# Patient Record
Sex: Female | Born: 1945
Health system: Southern US, Community
[De-identification: ages and names within clinical notes are randomized; demographics above are authoritative.]

## PROBLEM LIST (undated history)

## (undated) DIAGNOSIS — K810 Acute cholecystitis: Secondary | ICD-10-CM

## (undated) DIAGNOSIS — J189 Pneumonia, unspecified organism: Secondary | ICD-10-CM

## (undated) DIAGNOSIS — J45909 Unspecified asthma, uncomplicated: Secondary | ICD-10-CM

## (undated) DIAGNOSIS — J449 Chronic obstructive pulmonary disease, unspecified: Secondary | ICD-10-CM

## (undated) DIAGNOSIS — K8010 Calculus of gallbladder with chronic cholecystitis without obstruction: Secondary | ICD-10-CM

## (undated) DIAGNOSIS — Z87891 Personal history of nicotine dependence: Secondary | ICD-10-CM

## (undated) HISTORY — DX: Personal history of nicotine dependence: Z87.891

## (undated) HISTORY — DX: Pneumonia, unspecified organism: J18.9

## (undated) HISTORY — PX: TONSILLECTOMY: SUR1361

## (undated) HISTORY — DX: Acute cholecystitis: K81.0

## (undated) HISTORY — DX: Calculus of gallbladder with chronic cholecystitis without obstruction: K80.10

---

## 1976-06-01 HISTORY — PX: APPENDECTOMY: SHX54

## 1976-06-01 HISTORY — PX: ABDOMINAL HYSTERECTOMY: SHX81

## 1999-05-27 ENCOUNTER — Encounter: Admission: RE | Admit: 1999-05-27 | Discharge: 1999-05-27 | Payer: Self-pay | Admitting: Family Medicine

## 1999-05-27 ENCOUNTER — Encounter: Payer: Self-pay | Admitting: Family Medicine

## 1999-08-18 ENCOUNTER — Ambulatory Visit (HOSPITAL_COMMUNITY): Admission: RE | Admit: 1999-08-18 | Discharge: 1999-08-18 | Payer: Self-pay

## 1999-08-19 ENCOUNTER — Ambulatory Visit (HOSPITAL_COMMUNITY): Admission: RE | Admit: 1999-08-19 | Discharge: 1999-08-19 | Payer: Self-pay

## 1999-10-30 ENCOUNTER — Encounter: Admission: RE | Admit: 1999-10-30 | Discharge: 1999-10-30 | Payer: Self-pay | Admitting: Family Medicine

## 1999-10-30 ENCOUNTER — Encounter: Payer: Self-pay | Admitting: Family Medicine

## 2005-07-24 ENCOUNTER — Encounter: Admission: RE | Admit: 2005-07-24 | Discharge: 2005-07-24 | Payer: Self-pay | Admitting: Family Medicine

## 2005-10-27 ENCOUNTER — Encounter: Admission: RE | Admit: 2005-10-27 | Discharge: 2005-10-27 | Payer: Self-pay | Admitting: Family Medicine

## 2006-06-18 ENCOUNTER — Encounter: Admission: RE | Admit: 2006-06-18 | Discharge: 2006-06-18 | Payer: Self-pay | Admitting: Family Medicine

## 2006-07-15 ENCOUNTER — Encounter: Admission: RE | Admit: 2006-07-15 | Discharge: 2006-07-15 | Payer: Self-pay | Admitting: Family Medicine

## 2006-08-09 ENCOUNTER — Ambulatory Visit: Payer: Self-pay | Admitting: Vascular Surgery

## 2006-08-16 ENCOUNTER — Ambulatory Visit: Payer: Self-pay | Admitting: Vascular Surgery

## 2006-09-28 ENCOUNTER — Ambulatory Visit: Payer: Self-pay | Admitting: Vascular Surgery

## 2006-10-04 ENCOUNTER — Ambulatory Visit: Payer: Self-pay | Admitting: Vascular Surgery

## 2006-11-15 ENCOUNTER — Ambulatory Visit: Payer: Self-pay | Admitting: Vascular Surgery

## 2007-08-01 ENCOUNTER — Ambulatory Visit: Payer: Self-pay | Admitting: Vascular Surgery

## 2009-01-07 ENCOUNTER — Encounter: Admission: RE | Admit: 2009-01-07 | Discharge: 2009-01-07 | Payer: Self-pay | Admitting: Podiatry

## 2010-07-21 ENCOUNTER — Encounter (INDEPENDENT_AMBULATORY_CARE_PROVIDER_SITE_OTHER): Payer: BC Managed Care – PPO

## 2010-07-21 ENCOUNTER — Encounter: Payer: Self-pay | Admitting: Internal Medicine

## 2010-07-21 DIAGNOSIS — R0609 Other forms of dyspnea: Secondary | ICD-10-CM

## 2010-07-23 ENCOUNTER — Encounter: Payer: Self-pay | Admitting: Internal Medicine

## 2010-08-07 NOTE — Assessment & Plan Note (Signed)
Summary: asthma//jd    Other Orders: Carbon Monoxide diffusing w/capacity (16109) Lung Volumes/Gas dilution or washout (60454) Spirometry (Pre & Post) [phone])

## 2010-10-14 NOTE — Assessment & Plan Note (Signed)
OFFICE VISIT   ALMETER, WESTHOFF  DOB:  1946/05/15                                       11/15/2006  [id number]#:[id number]   The patient presents today for continued followup of laser ablation and  stab phlebectomy on her right leg.  The procedure was on 09/28/2006.  She has had complete resolution of any swelling and discomfort from her  knee proximally.  She does have some numbness over the lateral aspect of  her calf and also continues to have bruising over her pretibial area.  She has some swelling and discomfort over this area as well.  She does  not recall striking this area and explains it is somewhat odd for her to  have continued bruising now 6 weeks after her procedure.  I do not see  any evidence of swelling and she is otherwise pleased with her result.   PLAN:  To see her again in 6 weeks for continue followup.  She will  continue to treat this symptomatically.   Larina Earthly, M.D.  Electronically Signed   TFE/MEDQ  D:  11/15/2006  T:  11/16/2006  Job:  [id number]

## 2011-06-02 HISTORY — PX: CATARACT EXTRACTION, BILATERAL: SHX1313

## 2012-03-29 ENCOUNTER — Other Ambulatory Visit: Payer: Self-pay | Admitting: Family Medicine

## 2012-03-29 DIAGNOSIS — N63 Unspecified lump in unspecified breast: Secondary | ICD-10-CM

## 2012-03-31 ENCOUNTER — Other Ambulatory Visit: Payer: BC Managed Care – PPO

## 2012-04-08 ENCOUNTER — Ambulatory Visit
Admission: RE | Admit: 2012-04-08 | Discharge: 2012-04-08 | Disposition: A | Discharge status: Home / Self Care | Payer: Self-pay | Source: Ambulatory Visit | Attending: Family Medicine | Admitting: Family Medicine

## 2012-04-08 DIAGNOSIS — N63 Unspecified lump in unspecified breast: Secondary | ICD-10-CM

## 2013-04-07 ENCOUNTER — Ambulatory Visit
Admission: RE | Admit: 2013-04-07 | Discharge: 2013-04-07 | Disposition: A | Discharge status: Home / Self Care | Payer: Medicare Other | Source: Ambulatory Visit | Attending: Physician Assistant | Admitting: Physician Assistant

## 2013-04-07 ENCOUNTER — Other Ambulatory Visit: Payer: Self-pay | Admitting: Physician Assistant

## 2013-04-07 DIAGNOSIS — T1490XA Injury, unspecified, initial encounter: Secondary | ICD-10-CM

## 2015-06-12 ENCOUNTER — Other Ambulatory Visit: Payer: Self-pay | Admitting: *Deleted

## 2015-06-13 ENCOUNTER — Other Ambulatory Visit: Payer: Self-pay

## 2015-06-13 DIAGNOSIS — Z1231 Encounter for screening mammogram for malignant neoplasm of breast: Secondary | ICD-10-CM

## 2015-07-08 ENCOUNTER — Ambulatory Visit
Admission: RE | Admit: 2015-07-08 | Discharge: 2015-07-08 | Disposition: A | Discharge status: Home / Self Care | Payer: Medicare Other | Source: Ambulatory Visit

## 2015-07-08 DIAGNOSIS — Z1231 Encounter for screening mammogram for malignant neoplasm of breast: Secondary | ICD-10-CM

## 2015-08-16 ENCOUNTER — Encounter: Payer: Self-pay | Admitting: Emergency Medicine

## 2015-08-16 ENCOUNTER — Inpatient Hospital Stay
Admission: EM | Admit: 2015-08-16 | Discharge: 2015-08-21 | DRG: 191 | Disposition: A | Discharge status: Home / Self Care | Payer: Medicare Other | Attending: Internal Medicine | Admitting: Internal Medicine

## 2015-08-16 ENCOUNTER — Emergency Department: Payer: Medicare Other

## 2015-08-16 DIAGNOSIS — K5900 Constipation, unspecified: Secondary | ICD-10-CM | POA: Diagnosis present

## 2015-08-16 DIAGNOSIS — J440 Chronic obstructive pulmonary disease with acute lower respiratory infection: Secondary | ICD-10-CM | POA: Diagnosis not present

## 2015-08-16 DIAGNOSIS — J441 Chronic obstructive pulmonary disease with (acute) exacerbation: Secondary | ICD-10-CM | POA: Diagnosis not present

## 2015-08-16 DIAGNOSIS — G4700 Insomnia, unspecified: Secondary | ICD-10-CM | POA: Diagnosis present

## 2015-08-16 DIAGNOSIS — R7301 Impaired fasting glucose: Secondary | ICD-10-CM | POA: Diagnosis present

## 2015-08-16 DIAGNOSIS — Z7951 Long term (current) use of inhaled steroids: Secondary | ICD-10-CM | POA: Diagnosis not present

## 2015-08-16 DIAGNOSIS — R1310 Dysphagia, unspecified: Secondary | ICD-10-CM | POA: Diagnosis present

## 2015-08-16 DIAGNOSIS — Z66 Do not resuscitate: Secondary | ICD-10-CM | POA: Diagnosis present

## 2015-08-16 DIAGNOSIS — R0600 Dyspnea, unspecified: Secondary | ICD-10-CM

## 2015-08-16 DIAGNOSIS — E871 Hypo-osmolality and hyponatremia: Secondary | ICD-10-CM | POA: Diagnosis present

## 2015-08-16 DIAGNOSIS — J9610 Chronic respiratory failure, unspecified whether with hypoxia or hypercapnia: Secondary | ICD-10-CM | POA: Diagnosis present

## 2015-08-16 DIAGNOSIS — Z87891 Personal history of nicotine dependence: Secondary | ICD-10-CM | POA: Diagnosis not present

## 2015-08-16 DIAGNOSIS — Z79899 Other long term (current) drug therapy: Secondary | ICD-10-CM

## 2015-08-16 DIAGNOSIS — R739 Hyperglycemia, unspecified: Secondary | ICD-10-CM | POA: Diagnosis present

## 2015-08-16 DIAGNOSIS — J438 Other emphysema: Secondary | ICD-10-CM | POA: Diagnosis not present

## 2015-08-16 DIAGNOSIS — J449 Chronic obstructive pulmonary disease, unspecified: Secondary | ICD-10-CM | POA: Insufficient documentation

## 2015-08-16 DIAGNOSIS — E860 Dehydration: Secondary | ICD-10-CM | POA: Diagnosis present

## 2015-08-16 DIAGNOSIS — J208 Acute bronchitis due to other specified organisms: Secondary | ICD-10-CM | POA: Diagnosis present

## 2015-08-16 HISTORY — DX: Chronic obstructive pulmonary disease, unspecified: J44.9

## 2015-08-16 LAB — CBC WITH DIFFERENTIAL/PLATELET
BASOS PCT: 1 %
Basophils Absolute: 0.1 10*3/uL (ref 0–0.1)
EOS ABS: 0.6 10*3/uL (ref 0–0.7)
Eosinophils Relative: 8 %
HCT: 40.3 % (ref 35.0–47.0)
Hemoglobin: 13.9 g/dL (ref 12.0–16.0)
Lymphocytes Relative: 22 %
Lymphs Abs: 1.7 10*3/uL (ref 1.0–3.6)
MCH: 30.5 pg (ref 26.0–34.0)
MCHC: 34.6 g/dL (ref 32.0–36.0)
MCV: 88.1 fL (ref 80.0–100.0)
MONO ABS: 0.7 10*3/uL (ref 0.2–0.9)
MONOS PCT: 10 %
Neutro Abs: 4.4 10*3/uL (ref 1.4–6.5)
Neutrophils Relative %: 59 %
Platelets: 288 10*3/uL (ref 150–440)
RBC: 4.58 MIL/uL (ref 3.80–5.20)
RDW: 13 % (ref 11.5–14.5)
WBC: 7.5 10*3/uL (ref 3.6–11.0)

## 2015-08-16 LAB — TSH: TSH: 6.204 u[IU]/mL — AB (ref 0.350–4.500)

## 2015-08-16 LAB — COMPREHENSIVE METABOLIC PANEL
ALT: 18 U/L (ref 14–54)
AST: 23 U/L (ref 15–41)
Albumin: 4.4 g/dL (ref 3.5–5.0)
Alkaline Phosphatase: 68 U/L (ref 38–126)
Anion gap: 9 (ref 5–15)
BILIRUBIN TOTAL: 0.8 mg/dL (ref 0.3–1.2)
BUN: 22 mg/dL — AB (ref 6–20)
CO2: 20 mmol/L — ABNORMAL LOW (ref 22–32)
CREATININE: 1 mg/dL (ref 0.44–1.00)
Calcium: 8.6 mg/dL — ABNORMAL LOW (ref 8.9–10.3)
Chloride: 101 mmol/L (ref 101–111)
GFR calc Af Amer: >60 mL/min (ref >60)
GFR, EST NON AFRICAN AMERICAN: 56 mL/min — AB (ref >60)
Glucose, Bld: 128 mg/dL — ABNORMAL HIGH (ref 65–99)
Potassium: 3.8 mmol/L (ref 3.5–5.1)
Sodium: 130 mmol/L — ABNORMAL LOW (ref 135–145)
TOTAL PROTEIN: 7.6 g/dL (ref 6.5–8.1)

## 2015-08-16 LAB — BRAIN NATRIURETIC PEPTIDE: B Natriuretic Peptide: 26 pg/mL (ref 0.0–100.0)

## 2015-08-16 LAB — TROPONIN I

## 2015-08-16 LAB — INFLUENZA PANEL BY PCR (TYPE A & B)
H1N1FLUPCR: NOT DETECTED
INFLAPCR: NEGATIVE
Influenza B By PCR: NEGATIVE

## 2015-08-16 LAB — HEMOGLOBIN A1C: Hgb A1c MFr Bld: 5.7 % (ref 4.0–6.0)

## 2015-08-16 MED ORDER — ALPRAZOLAM 0.25 MG PO TABS
0.2500 mg | ORAL_TABLET | Freq: Two times a day (BID) | ORAL | Status: DC | PRN
  Administered 2015-08-19: 16:00:00 0.25 mg via ORAL
  Filled 2015-08-16: qty 1

## 2015-08-16 MED ORDER — LEVOFLOXACIN IN D5W 250 MG/50ML IV SOLN
250.0000 mg | INTRAVENOUS | Status: DC
  Filled 2015-08-16: qty 50

## 2015-08-16 MED ORDER — ACETAMINOPHEN 325 MG PO TABS: 650.0000 mg | ORAL_TABLET | Freq: Four times a day (QID) | ORAL | Status: DC | PRN

## 2015-08-16 MED ORDER — ACETAMINOPHEN 650 MG RE SUPP: 650.0000 mg | Freq: Four times a day (QID) | RECTAL | Status: DC | PRN

## 2015-08-16 MED ORDER — HYDROCODONE-ACETAMINOPHEN 5-325 MG PO TABS
1.0000 | ORAL_TABLET | ORAL | Status: DC | PRN
  Administered 2015-08-16 (×2): 1 via ORAL
  Administered 2015-08-16: 2 via ORAL
  Administered 2015-08-17 – 2015-08-18 (×4): 1 via ORAL
  Administered 2015-08-19: 07:00:00 2 via ORAL
  Filled 2015-08-16 (×2): qty 1
  Filled 2015-08-16: qty 2
  Filled 2015-08-16: qty 1
  Filled 2015-08-16: qty 2
  Filled 2015-08-16: qty 1
  Filled 2015-08-16 (×2): qty 2

## 2015-08-16 MED ORDER — IPRATROPIUM-ALBUTEROL 0.5-2.5 (3) MG/3ML IN SOLN
3.0000 mL | Freq: Once | RESPIRATORY_TRACT | Status: AC
  Administered 2015-08-16: 3 mL via RESPIRATORY_TRACT

## 2015-08-16 MED ORDER — SODIUM CHLORIDE 0.9 % IV SOLN
INTRAVENOUS | Status: DC
  Administered 2015-08-16 – 2015-08-17 (×2): via INTRAVENOUS

## 2015-08-16 MED ORDER — TIOTROPIUM BROMIDE MONOHYDRATE 18 MCG IN CAPS
18.0000 ug | ORAL_CAPSULE | Freq: Every day | RESPIRATORY_TRACT | Status: DC
  Administered 2015-08-16 – 2015-08-21 (×6): 18 ug via RESPIRATORY_TRACT
  Filled 2015-08-16 (×2): qty 5

## 2015-08-16 MED ORDER — ONDANSETRON HCL 4 MG PO TABS: 4.0000 mg | ORAL_TABLET | Freq: Four times a day (QID) | ORAL | Status: DC | PRN

## 2015-08-16 MED ORDER — ACETAMINOPHEN 500 MG PO TABS
1000.0000 mg | ORAL_TABLET | Freq: Once | ORAL | Status: AC
  Administered 2015-08-16: 1000 mg via ORAL
  Filled 2015-08-16: qty 2

## 2015-08-16 MED ORDER — ONDANSETRON HCL 4 MG/2ML IJ SOLN
4.0000 mg | Freq: Four times a day (QID) | INTRAMUSCULAR | Status: DC | PRN
  Administered 2015-08-17: 07:00:00 4 mg via INTRAVENOUS
  Filled 2015-08-16: qty 2

## 2015-08-16 MED ORDER — IPRATROPIUM-ALBUTEROL 0.5-2.5 (3) MG/3ML IN SOLN
3.0000 mL | Freq: Once | RESPIRATORY_TRACT | Status: AC
  Administered 2015-08-16: 3 mL via RESPIRATORY_TRACT
  Filled 2015-08-16: qty 3

## 2015-08-16 MED ORDER — ALBUTEROL SULFATE (2.5 MG/3ML) 0.083% IN NEBU
2.5000 mg | INHALATION_SOLUTION | Freq: Once | RESPIRATORY_TRACT | Status: AC
  Administered 2015-08-16: 2.5 mg via RESPIRATORY_TRACT
  Filled 2015-08-16: qty 3

## 2015-08-16 MED ORDER — SENNOSIDES-DOCUSATE SODIUM 8.6-50 MG PO TABS: 1.0000 | ORAL_TABLET | Freq: Every evening | ORAL | Status: DC | PRN

## 2015-08-16 MED ORDER — IPRATROPIUM-ALBUTEROL 0.5-2.5 (3) MG/3ML IN SOLN
3.0000 mL | RESPIRATORY_TRACT | Status: DC
  Administered 2015-08-16 – 2015-08-18 (×14): 3 mL via RESPIRATORY_TRACT
  Filled 2015-08-16 (×15): qty 3

## 2015-08-16 MED ORDER — LEVOFLOXACIN IN D5W 500 MG/100ML IV SOLN
500.0000 mg | Freq: Once | INTRAVENOUS | Status: AC
  Administered 2015-08-16: 500 mg via INTRAVENOUS
  Filled 2015-08-16: qty 100

## 2015-08-16 MED ORDER — LEVOFLOXACIN IN D5W 500 MG/100ML IV SOLN: 500.0000 mg | INTRAVENOUS | Status: DC

## 2015-08-16 MED ORDER — RISAQUAD PO CAPS
1.0000 | ORAL_CAPSULE | Freq: Every day | ORAL | Status: DC
  Administered 2015-08-16 – 2015-08-21 (×6): 1 via ORAL
  Filled 2015-08-16 (×6): qty 1

## 2015-08-16 MED ORDER — METHYLPREDNISOLONE SODIUM SUCC 125 MG IJ SOLR
60.0000 mg | Freq: Three times a day (TID) | INTRAMUSCULAR | Status: DC
  Administered 2015-08-16 – 2015-08-18 (×6): 60 mg via INTRAVENOUS
  Filled 2015-08-16 (×6): qty 2

## 2015-08-16 MED ORDER — MAGNESIUM SULFATE 2 GM/50ML IV SOLN
2.0000 g | Freq: Once | INTRAVENOUS | Status: AC
  Administered 2015-08-16: 2 g via INTRAVENOUS
  Filled 2015-08-16: qty 50

## 2015-08-16 MED ORDER — MONTELUKAST SODIUM 10 MG PO TABS
10.0000 mg | ORAL_TABLET | Freq: Every day | ORAL | Status: DC
  Administered 2015-08-16 – 2015-08-21 (×6): 10 mg via ORAL
  Filled 2015-08-16 (×6): qty 1

## 2015-08-16 MED ORDER — ENOXAPARIN SODIUM 40 MG/0.4ML ~~LOC~~ SOLN
40.0000 mg | SUBCUTANEOUS | Status: DC
  Filled 2015-08-16 (×4): qty 0.4

## 2015-08-16 MED ORDER — ALUM & MAG HYDROXIDE-SIMETH 200-200-20 MG/5ML PO SUSP: 30.0000 mL | Freq: Four times a day (QID) | ORAL | Status: DC | PRN

## 2015-08-16 MED ORDER — SODIUM CHLORIDE 0.9% FLUSH
3.0000 mL | Freq: Two times a day (BID) | INTRAVENOUS | Status: DC
  Administered 2015-08-16 – 2015-08-21 (×11): 3 mL via INTRAVENOUS

## 2015-08-16 NOTE — ED Notes (Signed)
Pt presents with shortness of breath and wheezing for one week. Audible wheezing heard across the room.

## 2015-08-16 NOTE — Progress Notes (Signed)
Pharmacy Note - Renal dose adjustment  Patient with orders for levofloxacin 500mg  IV Q24H for AECOPD.   Estimated Creatinine Clearance: 47.1 mL/min (by C-G formula based on Cr of 1).   Will adjust to levaquin 500mg  IV x 1 followed by 250mg  IV Q24H.  Will continue to follow and adjust as needed.  Garlon HatchetJody Jordynne Mccown, PharmD Clinical Pharmacist 08/16/2015 2:18 PM

## 2015-08-16 NOTE — ED Provider Notes (Addendum)
[facility] Emergency Department Provider Note  ____________________________________________  Time seen: Approximately 7:29 AM  I have reviewed the triage vital signs and the nursing notes.   HISTORY  Chief Complaint Shortness of Breath    HPI Stacey Durham is a 70 y.o. several days of worsening cough and shortness of breath. Last couple days has been so bad she has not been sleepy. She was coughing up white phlegm but is not now she's felt hot but really has not run a fever that she is for sure about. Patient is now very short of breath working to breathe hard and wheezing  Past Medical History  Diagnosis Date  . COPD (chronic obstructive pulmonary disease) (HCC)    COPD There are no active problems to display for this patient.   Past Surgical History  Procedure Laterality Date  . Abdominal hysterectomy    . Tonsillectomy    . Appendectomy      No current outpatient prescriptions on file.  Allergies Review of patient's allergies indicates no known allergies.  No family history on file.  Social History Social History  Substance Use Topics  . Smoking status: Never Smoker   . Smokeless tobacco: None  . Alcohol Use: No    Review of Systems Constitutional: No fever/chills Eyes: No visual changes. ENT: No sore throat. Cardiovascular: Denies chest pain. Respiratory:shortness of breath. Gastrointestinal: No abdominal pain.  No nausea, no vomiting.  No diarrhea.  No constipation. Genitourinary: Negative for dysuria. Musculoskeletal: Negative for back pain. Skin: Negative for rash. Neurological: Negative for headaches, focal weakness or numbness.  10-point ROS otherwise negative.  ____________________________________________   PHYSICAL EXAM:  VITAL SIGNS: ED Triage Vitals  Enc Vitals Group     BP --      Pulse --      Resp --      Temp --      Temp src --      SpO2 --      Weight --      Height --      Head Cir --       Peak Flow --      Pain Score --      Pain Loc --      Pain Edu? --      Excl. in GC? --    Constitutional: Alert and oriented. Respiratory distress Eyes: Conjunctivae are normal. PERRL. EOMI. Head: Atraumatic. Nose: No congestion/rhinnorhea. Mouth/Throat: Mucous membranes are moist.  Oropharynx non-erythematous. Neck: No stridor. Cardiovascular: Normal rate, regular rhythm. Grossly normal heart sounds.  Good peripheral circulation. Respiratory: Increased respiratory effort. retractions. Lungs wheezing Gastrointestinal: Soft and nontender. No distention. No abdominal bruits. No CVA tenderness. Musculoskeletal: No lower extremity tenderness nor edema.  No joint effusions. Neurologic:  Normal speech and language. No gross focal neurologic deficits are appreciated. No gait instability. Skin:  Skin is warm, dry and intact. No rash noted. Psychiatric: Mood and affect are normal. Speech and behavior are normal.  ____________________________________________   LABS (all labs ordered are listed, but only abnormal results are displayed)  Labs Reviewed  COMPREHENSIVE METABOLIC PANEL  TROPONIN I  BRAIN NATRIURETIC PEPTIDE  CBC WITH DIFFERENTIAL/PLATELET   ____________________________________________  EKG  EKG read and interpreted by me shows normal sinus rhythm rate of 96 normal axis no acute ST-T wave changes ____________________________________________  RADIOLOGY  Radiology reads the film as hyperexpansion bilateral bibasilar atelectasis. ____________________________________________   PROCEDURES   ____________________________________________   INITIAL IMPRESSION / ASSESSMENT AND PLAN /  ED COURSE  Pertinent labs & imaging results that were available during my care of the patient were reviewed by me and considered in my medical decision making (see chart for details).  We'll sign the patient out to Dr. Dione BoozeKanner. She has had 1-1 and is still working hard to  breathe. ____________________________________________   FINAL CLINICAL IMPRESSION(S) / ED DIAGNOSES  Final diagnoses:  Dyspnea      Arnaldo NatalPaul F Sire Poet, MD 08/16/15 0732    Arnaldo NatalPaul F Aitan Rossbach, MD 08/16/15 [phone]

## 2015-08-16 NOTE — H&P (Signed)
[facility] [place] [facility]   PATIENT NAME: Stacey Durham    MR#:  [id number]  DATE OF BIRTH:  Nov 11, 1945  DATE OF ADMISSION:  08/16/2015  PRIMARY CARE PHYSICIAN: Dr Clovis Riley REQUESTING/REFERRING PHYSICIAN: Dr Cyril Loosen  CHIEF COMPLAINT:   SOB  HISTORY OF PRESENT ILLNESS:  Daphna Lafuente  is a 70 y.o. female with a known history of Non-oxygen dependent COPD who presents with above complaint. For the past week patient has shortness of breath, cough and dyspnea exertion. She also endorses fatigue and wheezing. She is diagnosed with COPD exacerbation. She is given DuoNeb's 3 and same Medrol and continues to have bilateral wheezing. He shows no infiltrate. He  PAST MEDICAL HISTORY:   Past Medical History  Diagnosis Date  . COPD (chronic obstructive pulmonary disease) (HCC)     PAST SURGICAL HISTORY:   Past Surgical History  Procedure Laterality Date  . Abdominal hysterectomy    . Tonsillectomy    . Appendectomy      SOCIAL HISTORY:   Social History  Substance Use Topics  . Smoking status: Never Smoker   . Smokeless tobacco: Not on file  . Alcohol Use: No    FAMILY HISTORY:  No family history on file.  DRUG ALLERGIES:  No Known Allergies   REVIEW OF SYSTEMS:  CONSTITUTIONAL: No fever, ++fatigue and weakness.  EYES: No blurred or double vision.  EARS, NOSE, AND THROAT: No tinnitus or ear pain.  RESPIRATORY:++ cough, shortness of breath, wheezing NO hemoptysis.  CARDIOVASCULAR: No chest pain, orthopnea, edema.  GASTROINTESTINAL: No nausea, vomiting, diarrhea or abdominal pain.  GENITOURINARY: No dysuria, hematuria.  ENDOCRINE: No polyuria, nocturia,  HEMATOLOGY: No anemia, easy bruising or bleeding SKIN: No rash or lesion. MUSCULOSKELETAL: No joint pain or arthritis.   NEUROLOGIC: No tingling, numbness, weakness.  PSYCHIATRY: No anxiety or depression.   MEDICATIONS AT HOME:   Prior to Admission medications   Medication  Sig Start Date End Date Taking? Authorizing Provider  acidophilus (RISAQUAD) CAPS capsule Take 1 capsule by mouth daily.   Yes Historical Provider, MD  albuterol (PROVENTIL) (2.5 MG/3ML) 0.083% nebulizer solution Take 1 vial by nebulization as needed. 08/15/15  Yes Historical Provider, MD  calcium-vitamin D (OSCAL WITH D) 500-200 MG-UNIT tablet Take 1 tablet by mouth.   Yes Historical Provider, MD  montelukast (SINGULAIR) 10 MG tablet Take 1 tablet by mouth daily. 07/11/15  Yes Historical Provider, MD  PROAIR HFA 108 (90 Base) MCG/ACT inhaler Inhale 2 puffs into the lungs as needed. 06/05/15  Yes Historical Provider, MD      VITAL SIGNS:  Blood pressure 127/77, pulse 76, resp. rate 15, SpO2 98 %.  PHYSICAL EXAMINATION:  GENERAL:  70 y.o.-year-old patient lying in the bed with no acute distress. She is exhibiting pursed lip breathing EYES: Pupils equal, round, reactive to light and accommodation. No scleral icterus. Extraocular muscles intact.  HEENT: Head atraumatic, normocephalic. Oropharynx and nasopharynx clear.  NECK:  Supple, no jugular venous distention. No thyroid enlargement, no tenderness.  LUNGS: Bilateral diffuse expiratory wheezing without rales,rhonchi or crepitation. No use of accessory muscles of respiration.  CARDIOVASCULAR: S1, S2 normal. No murmurs, rubs, or gallops.  ABDOMEN: Soft, nontender, nondistended. Bowel sounds present. No organomegaly or mass.  EXTREMITIES: No pedal edema, cyanosis, or clubbing.  NEUROLOGIC: Cranial nerves II through XII are grossly intact. No focal deficits. PSYCHIATRIC: The patient is alert and oriented x 3.  SKIN: No obvious rash, lesion, or ulcer.   LABORATORY PANEL:  CBC  Recent Labs Lab 08/16/15 0739  WBC 7.5  HGB 13.9  HCT 40.3  PLT 288   ------------------------------------------------------------------------------------------------------------------  Chemistries   Recent Labs Lab 08/16/15 0739  NA 130*  K 3.8  CL 101   CO2 20*  GLUCOSE 128*  BUN 22*  CREATININE 1.00  CALCIUM 8.6*  AST 23  ALT 18  ALKPHOS 68  BILITOT 0.8   ------------------------------------------------------------------------------------------------------------------  Cardiac Enzymes  Recent Labs Lab 08/16/15 0739  TROPONINI <0.03   ------------------------------------------------------------------------------------------------------------------  RADIOLOGY:  Dg Chest Portable 1 View  08/16/2015  CLINICAL DATA:  Worsening cough and shortness of breath that began 1 day prior. History of COPD and smoking. EXAM: PORTABLE CHEST 1 VIEW COMPARISON:  None. FINDINGS: Normal cardiac silhouette and mediastinal contours with atherosclerotic plaque within the thoracic aorta. The lungs appear hyperexpanded with flattening of the diaphragms. Bibasilar linear heterogeneous opacities favored to represent atelectasis. No focal airspace opacities. No pleural effusion or pneumothorax. No evidence of edema. No acute osseus abnormalities. IMPRESSION: Lung hyperexpansion and bibasilar atelectasis without definitive acute cardiopulmonary disease on this AP portable examination. Further evaluation with a PA and lateral chest radiograph may be obtained as clinically indicated. Electronically Signed   By: Simonne ComeJohn  Watts M.D.   On: 08/16/2015 07:48    EKG:   Sinus tachycardia no ST elevation or depression  IMPRESSION AND PLAN:    70 year old female with history of COPD who presents with acute COPD exacerbation.  1. Acute COPD exacerbation: Patient needs IV steroids, DuoNeb's and inhalers. Levaquin will be added for bronchitis associated with COPD exacerbation. I will also check influenza given the large amount of influenza cases in this area.  2. Hyponatremia: This is due to dehydration and COPD exacerbation. Provide IV fluids and repeat sodium in a.m. Check TSh as well 3. Hyperglycemia: Check hemoglobin A1c.    All the records are reviewed and case  discussed with ED provider. Management plans discussed with the patient and she is in agreement.  CODE STATUS: DNR  TOTAL TIME TAKING CARE OF THIS PATIENT: 50 minutes.    Tyneshia Stivers M.D on 08/16/2015 at 10:18 AM  Between 7am to 6pm - Pager - [phone] After 6pm go to [email or web address] - password [email or web address]  [facility]  Office  [phone]  CC: Primary care physician; Pcp Not In System

## 2015-08-16 NOTE — ED Notes (Signed)
Admitting MD in room at this time to assess patient.

## 2015-08-17 LAB — BASIC METABOLIC PANEL
Anion gap: 7 (ref 5–15)
BUN: 15 mg/dL (ref 6–20)
CHLORIDE: 110 mmol/L (ref 101–111)
CO2: 22 mmol/L (ref 22–32)
Calcium: 8.7 mg/dL — ABNORMAL LOW (ref 8.9–10.3)
Creatinine, Ser: 0.88 mg/dL (ref 0.44–1.00)
GFR calc Af Amer: >60 mL/min (ref >60)
GFR calc non Af Amer: >60 mL/min (ref >60)
Glucose, Bld: 159 mg/dL — ABNORMAL HIGH (ref 65–99)
POTASSIUM: 3.9 mmol/L (ref 3.5–5.1)
SODIUM: 139 mmol/L (ref 135–145)

## 2015-08-17 LAB — CBC
HEMATOCRIT: 39.2 % (ref 35.0–47.0)
HEMOGLOBIN: 13.2 g/dL (ref 12.0–16.0)
MCH: 30.4 pg (ref 26.0–34.0)
MCHC: 33.8 g/dL (ref 32.0–36.0)
MCV: 90.1 fL (ref 80.0–100.0)
Platelets: 259 10*3/uL (ref 150–440)
RBC: 4.34 MIL/uL (ref 3.80–5.20)
RDW: 13.2 % (ref 11.5–14.5)
WBC: 7.4 10*3/uL (ref 3.6–11.0)

## 2015-08-17 MED ORDER — BUDESONIDE 0.25 MG/2ML IN SUSP
0.2500 mg | Freq: Two times a day (BID) | RESPIRATORY_TRACT | Status: DC
  Administered 2015-08-17 – 2015-08-20 (×6): 0.25 mg via RESPIRATORY_TRACT
  Filled 2015-08-17 (×6): qty 2

## 2015-08-17 MED ORDER — LEVOFLOXACIN 500 MG PO TABS
500.0000 mg | ORAL_TABLET | Freq: Every day | ORAL | Status: DC
  Administered 2015-08-17 – 2015-08-18 (×2): 500 mg via ORAL
  Filled 2015-08-17 (×2): qty 1

## 2015-08-17 NOTE — Progress Notes (Signed)
Patient ID: Stacey Durham Patteson, female   DOB: [birth year], 70 y.o.   MRN: [id number] [facility] PRO[facility]  Stacey Durham Schuler WUJ:[id number] DOB: [birth year] DOA: 08/16/2015 PCP: Lupe Carneyean Mitchell, MD  HPI/Subjective: Patient still very short of breath. She can hardly complete her sentences. She does not feel well at all. Some cough but unable to bring anything up.  Objective: Filed Vitals:   08/17/15 0511 08/17/15 1425  BP: 102/58 130/69  Pulse: 97 103  Temp: 97.5 F (36.4 C) 98.6 F (37 C)  Resp: 16 18    Intake/Output Summary (Last 24 hours) at 08/17/15 1555 Last data filed at 08/17/15 1300  Gross per 24 hour  Intake    720 ml  Output    150 ml  Net    570 ml   Filed Weights   08/16/15 1142  Weight: 62.506 kg (137 lb 12.8 oz)    ROS: Review of Systems  Constitutional: Negative for fever and chills.  Eyes: Negative for blurred vision.  Respiratory: Positive for cough, shortness of breath and wheezing.   Cardiovascular: Negative for chest pain.  Gastrointestinal: Negative for nausea, vomiting, abdominal pain, diarrhea and constipation.  Genitourinary: Negative for dysuria.  Musculoskeletal: Negative for joint pain.  Neurological: Negative for dizziness and headaches.   Exam: Physical Exam  Constitutional: She is oriented to person, place, and time.  HENT:  Nose: No mucosal edema.  Mouth/Throat: No oropharyngeal exudate or posterior oropharyngeal edema.  Eyes: Conjunctivae, EOM and lids are normal. Pupils are equal, round, and reactive to light.  Neck: No JVD present. Carotid bruit is not present. No edema present. No thyroid mass and no thyromegaly present.  Cardiovascular: S1 normal and S2 normal.  Exam reveals no gallop.   No murmur heard. Pulses:      Dorsalis pedis pulses are 2+ on the right side, and 2+ on the left side.  Respiratory: No respiratory distress. She has decreased breath sounds in the right middle field, the right lower field, the left  middle field and the left lower field. She has wheezes in the right middle field, the right lower field, the left middle field and the left lower field. She has no rhonchi. She has no rales.  GI: Soft. Bowel sounds are normal. There is no tenderness.  Musculoskeletal:       Right ankle: She exhibits swelling.       Left ankle: She exhibits swelling.  Lymphadenopathy:    She has no cervical adenopathy.  Neurological: She is alert and oriented to person, place, and time. No cranial nerve deficit.  Skin: Skin is warm. No rash noted. Nails show no clubbing.  Psychiatric: She has a normal mood and affect.    Data Reviewed: Basic Metabolic Panel:  Recent Labs Lab 08/16/15 0739 08/17/15 0533  NA 130* 139  K 3.8 3.9  CL 101 110  CO2 20* 22  GLUCOSE 128* 159*  BUN 22* 15  CREATININE 1.00 0.88  CALCIUM 8.6* 8.7*   Liver Function Tests:  Recent Labs Lab 08/16/15 0739  AST 23  ALT 18  ALKPHOS 68  BILITOT 0.8  PROT 7.6  ALBUMIN 4.4   CBC:  Recent Labs Lab 08/16/15 0739 08/17/15 0533  WBC 7.5 7.4  NEUTROABS 4.4  --   HGB 13.9 13.2  HCT 40.3 39.2  MCV 88.1 90.1  PLT 288 259   Cardiac Enzymes:  Recent Labs Lab 08/16/15 0739  TROPONINI <0.03    Studies: Dg Chest Portable 1 View  08/16/2015  CLINICAL DATA:  Worsening cough and shortness of breath that began 1 day prior. History of COPD and smoking. EXAM: PORTABLE CHEST 1 VIEW COMPARISON:  None. FINDINGS: Normal cardiac silhouette and mediastinal contours with atherosclerotic plaque within the thoracic aorta. The lungs appear hyperexpanded with flattening of the diaphragms. Bibasilar linear heterogeneous opacities favored to represent atelectasis. No focal airspace opacities. No pleural effusion or pneumothorax. No evidence of edema. No acute osseus abnormalities. IMPRESSION: Lung hyperexpansion and bibasilar atelectasis without definitive acute cardiopulmonary disease on this AP portable examination. Further evaluation  with a PA and lateral chest radiograph may be obtained as clinically indicated. Electronically Signed   By: Simonne Come M.D.   On: 08/16/2015 07:48    Scheduled Meds: . acidophilus  1 capsule Oral Daily  . budesonide (PULMICORT) nebulizer solution  0.25 mg Nebulization BID  . enoxaparin (LOVENOX) injection  40 mg Subcutaneous Q24H  . ipratropium-albuterol  3 mL Nebulization Q4H  . levofloxacin  500 mg Oral Daily  . methylPREDNISolone (SOLU-MEDROL) injection  60 mg Intravenous 3 times per day  . montelukast  10 mg Oral Daily  . sodium chloride flush  3 mL Intravenous Q12H  . tiotropium  18 mcg Inhalation Daily    Assessment/Plan:  1.  COPD exacerbation. Continue IV Solu-Medrol 60 mg IV every 8 hours. Continue DuoNeb nebulizer solution and add budesonide nebulizers. Continue empiric Levaquin. 2. Hyponatremia improved with IV fluids. Can stop IV fluids today. 3. Impaired fasting glucose. Hemoglobin A1c 5.7 patient is not a diabetic. Sugars will likely be high with steroids though.  Code Status:     Code Status Orders        Start     Ordered   08/16/15 1134  Full code   Continuous     08/16/15 1133    Code Status History    Date Active Date Inactive Code Status Order ID Comments User Context   This patient has a current code status but no historical code status.     Disposition Plan: Home when breathing better  Antibiotics:  Levaquin  Time spent: 25 minutes  Alford Highland  [facility]

## 2015-08-17 NOTE — Progress Notes (Signed)
   08/17/15 1200  Clinical Encounter Type  Visited With Patient  Visit Type Initial  Referral From Nurse  Spiritual Encounters  Spiritual Needs Literature  Stress Factors  Patient Stress Factors Health changes  Chaplain Maisie Fushomas dropped of Ad packet to patient. Patient stated she will look over documents with her attorney.

## 2015-08-17 NOTE — Progress Notes (Signed)
Pharmacy Note - Renal dose adjustment and IV to PO  Patient with orders for levofloxacin 250mg  IV Q24H for AECOPD.   Estimated Creatinine Clearance: 53.5 mL/min (by C-G formula based on Cr of 0.88).   Will adjust to levaquin back  500mg   Due to SCr and CrCl improvement.   Patient is eating >50% meals, afebrile, and has other PO medications. Will Change Levofloxacin from IV to PO.   Will continue to follow and adjust as needed.  Cher NakaiSheema Larrisa Cravey, PharmD Pharmacy Resident 08/17/2015 1:51 PM

## 2015-08-18 MED ORDER — IPRATROPIUM-ALBUTEROL 0.5-2.5 (3) MG/3ML IN SOLN
3.0000 mL | RESPIRATORY_TRACT | Status: DC | PRN
  Administered 2015-08-19 (×2): 3 mL via RESPIRATORY_TRACT
  Filled 2015-08-18: qty 3

## 2015-08-18 MED ORDER — ZOLPIDEM TARTRATE 5 MG PO TABS: 10.0000 mg | ORAL_TABLET | Freq: Every day | ORAL | Status: DC

## 2015-08-18 MED ORDER — GUAIFENESIN 100 MG/5ML PO SOLN
100.0000 mg | Freq: Four times a day (QID) | ORAL | Status: DC | PRN
  Administered 2015-08-18 – 2015-08-20 (×5): 100 mg via ORAL
  Filled 2015-08-18: qty 10
  Filled 2015-08-18 (×2): qty 5
  Filled 2015-08-18 (×2): qty 10
  Filled 2015-08-18: qty 5

## 2015-08-18 MED ORDER — IPRATROPIUM-ALBUTEROL 0.5-2.5 (3) MG/3ML IN SOLN
3.0000 mL | Freq: Four times a day (QID) | RESPIRATORY_TRACT | Status: DC
  Administered 2015-08-19 – 2015-08-20 (×4): 3 mL via RESPIRATORY_TRACT
  Filled 2015-08-18 (×6): qty 3

## 2015-08-18 MED ORDER — METHYLPREDNISOLONE SODIUM SUCC 40 MG IJ SOLR
40.0000 mg | Freq: Two times a day (BID) | INTRAMUSCULAR | Status: DC
  Administered 2015-08-18 – 2015-08-19 (×2): 40 mg via INTRAVENOUS
  Filled 2015-08-18 (×2): qty 1

## 2015-08-18 MED ORDER — ZOLPIDEM TARTRATE 5 MG PO TABS
5.0000 mg | ORAL_TABLET | Freq: Every day | ORAL | Status: DC
  Administered 2015-08-18 – 2015-08-20 (×3): 5 mg via ORAL
  Filled 2015-08-18 (×3): qty 1

## 2015-08-18 NOTE — Progress Notes (Signed)
Patient ID: Stacey Durham, female   DOB: Sep 22, 1945, 70 y.o.   MRN: [id number] [facility] PROGRESS NOTE  Shanzay Hepworth WUJ:[id number] DOB: 20-Oct-1945 DOA: 08/16/2015 PCP: Lupe Carney, MD  HPI/Subjective: Patient not feeling well at all. She is having continuous pressure in her entire chest cavity including her back. She is having trouble breathing. The cough has settled down with some cough medication. Patient states that she has not slept since being here.  Objective: Filed Vitals:   08/18/15 0450 08/18/15 1257  BP: 130/68 136/69  Pulse: 94 91  Temp: 97.7 F (36.5 C) 97.5 F (36.4 C)  Resp: 18 18    Filed Weights   08/16/15 1142  Weight: 62.506 kg (137 lb 12.8 oz)    ROS: Review of Systems  Constitutional: Negative for fever and chills.  Eyes: Negative for blurred vision.  Respiratory: Positive for cough, shortness of breath and wheezing.   Cardiovascular: Positive for chest pain.  Gastrointestinal: Negative for nausea, vomiting, abdominal pain, diarrhea and constipation.  Genitourinary: Negative for dysuria.  Musculoskeletal: Negative for joint pain.  Neurological: Negative for dizziness and headaches.   Exam: Physical Exam  Constitutional: She is oriented to person, place, and time.  HENT:  Nose: No mucosal edema.  Mouth/Throat: No oropharyngeal exudate or posterior oropharyngeal edema.  Eyes: Conjunctivae, EOM and lids are normal. Pupils are equal, round, and reactive to light.  Neck: No JVD present. Carotid bruit is not present. No edema present. No thyroid mass and no thyromegaly present.  Cardiovascular: S1 normal and S2 normal.  Exam reveals no gallop.   No murmur heard. Pulses:      Dorsalis pedis pulses are 2+ on the right side, and 2+ on the left side.  Respiratory: No respiratory distress. She has decreased breath sounds in the right middle field, the right lower field, the left middle field and the left lower field. She has wheezes in  the right middle field, the right lower field, the left middle field and the left lower field. She has no rhonchi. She has no rales.  GI: Soft. Bowel sounds are normal. There is no tenderness.  Musculoskeletal:       Right ankle: She exhibits swelling.       Left ankle: She exhibits swelling.  Lymphadenopathy:    She has no cervical adenopathy.  Neurological: She is alert and oriented to person, place, and time. No cranial nerve deficit.  Skin: Skin is warm. No rash noted. Nails show no clubbing.  Psychiatric: She has a normal mood and affect.    Data Reviewed: Basic Metabolic Panel:  Recent Labs Lab 08/16/15 0739 08/17/15 0533  NA 130* 139  K 3.8 3.9  CL 101 110  CO2 20* 22  GLUCOSE 128* 159*  BUN 22* 15  CREATININE 1.00 0.88  CALCIUM 8.6* 8.7*   Liver Function Tests:  Recent Labs Lab 08/16/15 0739  AST 23  ALT 18  ALKPHOS 68  BILITOT 0.8  PROT 7.6  ALBUMIN 4.4   CBC:  Recent Labs Lab 08/16/15 0739 08/17/15 0533  WBC 7.5 7.4  NEUTROABS 4.4  --   HGB 13.9 13.2  HCT 40.3 39.2  MCV 88.1 90.1  PLT 288 259   Cardiac Enzymes:  Recent Labs Lab 08/16/15 0739  TROPONINI <0.03     Scheduled Meds: . acidophilus  1 capsule Oral Daily  . budesonide (PULMICORT) nebulizer solution  0.25 mg Nebulization BID  . enoxaparin (LOVENOX) injection  40 mg Subcutaneous Q24H  .  ipratropium-albuterol  3 mL Nebulization Q4H  . levofloxacin  500 mg Oral Daily  . methylPREDNISolone (SOLU-MEDROL) injection  40 mg Intravenous Q12H  . montelukast  10 mg Oral Daily  . sodium chloride flush  3 mL Intravenous Q12H  . tiotropium  18 mcg Inhalation Daily  . zolpidem  5 mg Oral QHS    Assessment/Plan:  1.  COPD exacerbation. Decrease IV Solu-Medrol 40 mg IV every 12 hours secondary to her insomnia. Continue DuoNeb nebulizer solution and budesonide nebulizers. Continue empiric Levaquin. 2. Hyponatremia - normalized 3. Impaired fasting glucose. Hemoglobin A1c 5.7 patient is not  a diabetic. Sugars will likely be high with steroids though. 4. Chest pain likely secondary COPD exacerbation 5. Insomnia- I will give Ambien tonight  Code Status:     Code Status Orders        Start     Ordered   08/16/15 1134  Full code   Continuous     08/16/15 1133    Code Status History    Date Active Date Inactive Code Status Order ID Comments User Context   This patient has a current code status but no historical code status.     Disposition Plan: Home when breathing better  Antibiotics:  Levaquin  Time spent: 23 minutes  Alford HighlandWIETING, Katey Barrie  [facility]

## 2015-08-19 LAB — T4, FREE: Free T4: 0.81 ng/dL (ref 0.61–1.12)

## 2015-08-19 MED ORDER — POLYETHYLENE GLYCOL 3350 17 G PO PACK
17.0000 g | PACK | Freq: Every day | ORAL | Status: DC
  Administered 2015-08-19 – 2015-08-21 (×3): 17 g via ORAL
  Filled 2015-08-19 (×3): qty 1

## 2015-08-19 MED ORDER — SENNOSIDES-DOCUSATE SODIUM 8.6-50 MG PO TABS
2.0000 | ORAL_TABLET | Freq: Two times a day (BID) | ORAL | Status: DC
  Administered 2015-08-19 – 2015-08-21 (×5): 2 via ORAL
  Filled 2015-08-19 (×5): qty 2

## 2015-08-19 MED ORDER — AZITHROMYCIN 250 MG PO TABS
250.0000 mg | ORAL_TABLET | Freq: Every day | ORAL | Status: DC
  Administered 2015-08-19 – 2015-08-20 (×2): 250 mg via ORAL
  Filled 2015-08-19 (×3): qty 1

## 2015-08-19 MED ORDER — METHYLPREDNISOLONE SODIUM SUCC 40 MG IJ SOLR
40.0000 mg | Freq: Four times a day (QID) | INTRAMUSCULAR | Status: DC
  Administered 2015-08-19 – 2015-08-21 (×8): 40 mg via INTRAVENOUS
  Filled 2015-08-19 (×8): qty 1

## 2015-08-19 MED ORDER — HYDROCOD POLST-CPM POLST ER 10-8 MG/5ML PO SUER
5.0000 mL | Freq: Two times a day (BID) | ORAL | Status: DC
  Administered 2015-08-19 – 2015-08-21 (×5): 5 mL via ORAL
  Filled 2015-08-19 (×5): qty 5

## 2015-08-19 NOTE — Care Management Important Message (Signed)
Important Message  Patient Details  Name: Stacey SalmonJoanne Baldassari MRN: [id number] Date of Birth: 1945-12-09   Medicare Important Message Given:  Yes    Olegario MessierKathy A Lanelle Lindo 08/19/2015, 11:29 AM

## 2015-08-19 NOTE — Progress Notes (Signed)
Patient ID: Stacey Durham, female   DOB: [birth year], 70 y.o.   MRN: [id number] [facility] PRO[facility]ESS [facility]  Stacey SalmonJoanne Durham WGN:[id number] DOB: [birth year] DOA: 08/16/2015 PCP: Lupe Carneyean Mitchell, MD  HPI/Subjective: Patient feeling worse than yesterday. She is having trouble breathing, cough not being relieved with medication. she was able to sleep well, yesterday  Objective: Filed Vitals:   08/19/15 0507 08/19/15 1442  BP: 135/73 107/59  Pulse: 82 87  Temp: 97.7 F (36.5 C) 97.5 F (36.4 C)  Resp: 18 20    Filed Weights   08/16/15 1142  Weight: 62.506 kg (137 lb 12.8 oz)    ROS: Review of Systems  Constitutional: Negative for fever and chills.  Eyes: Negative for blurred vision.  Respiratory: Positive for cough, shortness of breath and wheezing.   Cardiovascular: Positive for chest pain.  Gastrointestinal: Negative for nausea, vomiting, abdominal pain, diarrhea and constipation.  Genitourinary: Negative for dysuria.  Musculoskeletal: Negative for joint pain.  Neurological: Negative for dizziness and headaches.   Exam: Physical Exam  Constitutional: She is oriented to person, place, and time.  HENT:  Nose: No mucosal edema.  Mouth/Throat: No oropharyngeal exudate or posterior oropharyngeal edema.  Eyes: Conjunctivae, EOM and lids are normal. Pupils are equal, round, and reactive to light.  Neck: No JVD present. Carotid bruit is not present. No edema present. No thyroid mass and no thyromegaly present.  Cardiovascular: S1 normal and S2 normal.  Exam reveals no gallop.   No murmur heard. Pulses:      Dorsalis pedis pulses are 2+ on the right side, and 2+ on the left side.  Respiratory: No respiratory distress. She has decreased breath sounds in the right middle field, the right lower field, the left middle field and the left lower field. She has wheezes in the right middle field, the right lower field, the left middle field and the left lower field. She has no  rhonchi. She has no rales.  GI: Soft. Bowel sounds are normal. There is no tenderness.  Musculoskeletal:       Right ankle: She exhibits swelling.       Left ankle: She exhibits swelling.  Lymphadenopathy:    She has no cervical adenopathy.  Neurological: She is alert and oriented to person, place, and time. No cranial nerve deficit.  Skin: Skin is warm. No rash noted. Nails show no clubbing.  Psychiatric: She has a normal mood and affect.    Data Reviewed: Basic Metabolic Panel:  Recent Labs Lab 08/16/15 0739 08/17/15 0533  NA 130* 139  K 3.8 3.9  CL 101 110  CO2 20* 22  GLUCOSE 128* 159*  BUN 22* 15  CREATININE 1.00 0.88  CALCIUM 8.6* 8.7*   Liver Function Tests:  Recent Labs Lab 08/16/15 0739  AST 23  ALT 18  ALKPHOS 68  BILITOT 0.8  PROT 7.6  ALBUMIN 4.4   CBC:  Recent Labs Lab 08/16/15 0739 08/17/15 0533  WBC 7.5 7.4  NEUTROABS 4.4  --   HGB 13.9 13.2  HCT 40.3 39.2  MCV 88.1 90.1  PLT 288 259   Cardiac Enzymes:  Recent Labs Lab 08/16/15 0739  TROPONINI <0.03     Scheduled Meds: . acidophilus  1 capsule Oral Daily  . azithromycin  250 mg Oral Daily  . budesonide (PULMICORT) nebulizer solution  0.25 mg Nebulization BID  . chlorpheniramine-HYDROcodone  5 mL Oral Q12H  . enoxaparin (LOVENOX) injection  40 mg Subcutaneous Q24H  . ipratropium-albuterol  3 mL  Nebulization QID  . methylPREDNISolone (SOLU-MEDROL) injection  40 mg Intravenous Q6H  . montelukast  10 mg Oral Daily  . polyethylene glycol  17 g Oral Daily  . senna-docusate  2 tablet Oral BID  . sodium chloride flush  3 mL Intravenous Q12H  . tiotropium  18 mcg Inhalation Daily  . zolpidem  5 mg Oral QHS    Assessment/Plan:  1.  COPD exacerbation: Increase IV Solu-Medrol 40 mg IV every 6 hours secondary to worsening breathing. Continue DuoNeb nebulizer solution and budesonide nebulizers. change Levaquin to PO Zithromax 2. Hyponatremia - normalized 3. Impaired fasting glucose.  Hemoglobin A1c 5.7 patient is not a diabetic. Sugars will likely be high with steroids though. 4. Chest pain likely secondary COPD exacerbation 5. Insomnia- Ambien prn  Code Status:     Code Status Orders        Start     Ordered   08/16/15 1134  Full code   Continuous     08/16/15 1133    Code Status History    Date Active Date Inactive Code Status Order ID Comments User Context   This patient has a current code status but no historical code status.     Disposition Plan: Home when breathing better  Antibiotics:  Zithromax  Time spent: 25 minutes  [facility], Madalaine Portier  [facility]

## 2015-08-20 DIAGNOSIS — J438 Other emphysema: Secondary | ICD-10-CM

## 2015-08-20 LAB — BASIC METABOLIC PANEL
Anion gap: 8 (ref 5–15)
BUN: 22 mg/dL — ABNORMAL HIGH (ref 6–20)
CALCIUM: 8.5 mg/dL — AB (ref 8.9–10.3)
CO2: 24 mmol/L (ref 22–32)
CREATININE: 1.03 mg/dL — AB (ref 0.44–1.00)
Chloride: 99 mmol/L — ABNORMAL LOW (ref 101–111)
GFR calc non Af Amer: 54 mL/min — ABNORMAL LOW (ref >60)
Glucose, Bld: 144 mg/dL — ABNORMAL HIGH (ref 65–99)
Potassium: 4.2 mmol/L (ref 3.5–5.1)
SODIUM: 131 mmol/L — AB (ref 135–145)

## 2015-08-20 LAB — CBC
HCT: 39.9 % (ref 35.0–47.0)
Hemoglobin: 13.5 g/dL (ref 12.0–16.0)
MCH: 29.9 pg (ref 26.0–34.0)
MCHC: 33.8 g/dL (ref 32.0–36.0)
MCV: 88.7 fL (ref 80.0–100.0)
PLATELETS: 266 10*3/uL (ref 150–440)
RBC: 4.5 MIL/uL (ref 3.80–5.20)
RDW: 13.3 % (ref 11.5–14.5)
WBC: 11.8 10*3/uL — ABNORMAL HIGH (ref 3.6–11.0)

## 2015-08-20 LAB — T3, FREE: T3, Free: 1.7 pg/mL — ABNORMAL LOW (ref 2.0–4.4)

## 2015-08-20 MED ORDER — IPRATROPIUM-ALBUTEROL 0.5-2.5 (3) MG/3ML IN SOLN
3.0000 mL | RESPIRATORY_TRACT | Status: DC
  Administered 2015-08-20 – 2015-08-21 (×7): 3 mL via RESPIRATORY_TRACT
  Filled 2015-08-20 (×6): qty 3

## 2015-08-20 MED ORDER — BISACODYL 10 MG RE SUPP
10.0000 mg | Freq: Once | RECTAL | Status: AC
  Administered 2015-08-20: 12:00:00 10 mg via RECTAL
  Filled 2015-08-20: qty 1

## 2015-08-20 MED ORDER — MOMETASONE FURO-FORMOTEROL FUM 100-5 MCG/ACT IN AERO
2.0000 | INHALATION_SPRAY | Freq: Two times a day (BID) | RESPIRATORY_TRACT | Status: DC
  Administered 2015-08-20 – 2015-08-21 (×3): 2 via RESPIRATORY_TRACT
  Filled 2015-08-20: qty 8.8

## 2015-08-20 MED ORDER — BUDESONIDE 0.5 MG/2ML IN SUSP
0.5000 mg | Freq: Two times a day (BID) | RESPIRATORY_TRACT | Status: DC
  Administered 2015-08-20 – 2015-08-21 (×2): 0.5 mg via RESPIRATORY_TRACT
  Filled 2015-08-20 (×2): qty 2

## 2015-08-20 MED ORDER — ENSURE ENLIVE PO LIQD
237.0000 mL | ORAL | Status: DC
  Administered 2015-08-20: 15:00:00 237 mL via ORAL

## 2015-08-20 MED ORDER — FLEET ENEMA 7-19 GM/118ML RE ENEM
1.0000 | ENEMA | Freq: Once | RECTAL | Status: AC
  Administered 2015-08-20: 1 via RECTAL

## 2015-08-20 NOTE — Progress Notes (Signed)
   08/20/15 1335  Oxygen Therapy  SpO2 (!) 86 %  O2 Device Room Air (minimal exertion)

## 2015-08-20 NOTE — Progress Notes (Signed)
Patient ID: Stacey SalmonJoanne Sanker, female   DOB: 28-May-1946, 70 y.o.   MRN: [id number] [facility] PROGRESS NOTE  Stacey SalmonJoanne Apolinar ION:[id number] DOB: 28-May-1946 DOA: 08/16/2015 PCP: No primary care provider on file.  HPI/Subjective: Breathing much better than yesterday, still coughing, constipated and feels short of breath on minimal exertion  Objective: Filed Vitals:   08/19/15 2034 08/20/15 0442  BP: 155/92 148/79  Pulse: 100 76  Temp: 97.2 F (36.2 C) 97.7 F (36.5 C)  Resp: 22 20    Filed Weights   08/16/15 1142  Weight: 62.506 kg (137 lb 12.8 oz)    ROS: Review of Systems  Constitutional: Negative for fever and chills.  Eyes: Negative for blurred vision.  Respiratory: Positive for cough, shortness of breath and wheezing.   Cardiovascular: Positive for chest pain.  Gastrointestinal: Negative for nausea, vomiting, abdominal pain, diarrhea and constipation.  Genitourinary: Negative for dysuria.  Musculoskeletal: Negative for joint pain.  Neurological: Negative for dizziness and headaches.   Exam: Physical Exam  Constitutional: She is oriented to person, place, and time.  HENT:  Nose: No mucosal edema.  Mouth/Throat: No oropharyngeal exudate or posterior oropharyngeal edema.  Eyes: Conjunctivae, EOM and lids are normal. Pupils are equal, round, and reactive to light.  Neck: No JVD present. Carotid bruit is not present. No edema present. No thyroid mass and no thyromegaly present.  Cardiovascular: S1 normal and S2 normal.  Exam reveals no gallop.   No murmur heard. Pulses:      Dorsalis pedis pulses are 2+ on the right side, and 2+ on the left side.  Respiratory: No respiratory distress. She has decreased breath sounds in the right middle field, the right lower field, the left middle field and the left lower field. She has wheezes in the right middle field, the right lower field, the left middle field and the left lower field. She has no rhonchi. She has no  rales.  GI: Soft. Bowel sounds are normal. There is no tenderness.  Musculoskeletal:       Right ankle: She exhibits swelling.       Left ankle: She exhibits swelling.  Lymphadenopathy:    She has no cervical adenopathy.  Neurological: She is alert and oriented to person, place, and time. No cranial nerve deficit.  Skin: Skin is warm. No rash noted. Nails show no clubbing.  Psychiatric: She has a normal mood and affect.    Data Reviewed: Basic Metabolic Panel:  Recent Labs Lab 08/16/15 0739 08/17/15 0533 08/20/15 0412  NA 130* 139 131*  K 3.8 3.9 4.2  CL 101 110 99*  CO2 20* 22 24  GLUCOSE 128* 159* 144*  BUN 22* 15 22*  CREATININE 1.00 0.88 1.03*  CALCIUM 8.6* 8.7* 8.5*   Liver Function Tests:  Recent Labs Lab 08/16/15 0739  AST 23  ALT 18  ALKPHOS 68  BILITOT 0.8  PROT 7.6  ALBUMIN 4.4   CBC:  Recent Labs Lab 08/16/15 0739 08/17/15 0533 08/20/15 0412  WBC 7.5 7.4 11.8*  NEUTROABS 4.4  --   --   HGB 13.9 13.2 13.5  HCT 40.3 39.2 39.9  MCV 88.1 90.1 88.7  PLT 288 259 266   Cardiac Enzymes:  Recent Labs Lab 08/16/15 0739  TROPONINI <0.03     Scheduled Meds: . acidophilus  1 capsule Oral Daily  . azithromycin  250 mg Oral Daily  . budesonide (PULMICORT) nebulizer solution  0.25 mg Nebulization BID  . chlorpheniramine-HYDROcodone  5 mL Oral Q12H  .  enoxaparin (LOVENOX) injection  40 mg Subcutaneous Q24H  . ipratropium-albuterol  3 mL Nebulization QID  . methylPREDNISolone (SOLU-MEDROL) injection  40 mg Intravenous Q6H  . montelukast  10 mg Oral Daily  . polyethylene glycol  17 g Oral Daily  . senna-docusate  2 tablet Oral BID  . sodium chloride flush  3 mL Intravenous Q12H  . sodium phosphate  1 enema Rectal Once  . tiotropium  18 mcg Inhalation Daily  . zolpidem  5 mg Oral QHS    Assessment/Plan:  1.  COPD exacerbation: Continue IV Solu-Medrol 40 mg IV every 6 hours. Continue DuoNeb nebulizer solution and budesonide nebulizer.  Continue  Oral Zithromax, we will initiate COPD Gold and suffering very slow progress and high risk for readmission. 2. Hyponatremia -  sodium 131, likely due to poor by mouth intake 3.  Constipation: On MiraLAX, senna with Colace 2 tablets twice a day, we will add Fleet enema.  Stop narcotic pain medications 4. Chest pain likely secondary COPD exacerbation 5. Insomnia- Ambien prn  Code Status:  full code Disposition Plan: Home when breathing better  Antibiotics:  Zithromax  Time spent: 25 minutes  [facility], Clover Feehan  [facility]

## 2015-08-20 NOTE — Progress Notes (Signed)
Initial Nutrition Assessment   INTERVENTION:   Meals and Snacks: Cater to patient preferences Medical Food Supplement Therapy: will recommend Ensure Enlive po daily, each supplement provides 350 kcal and 20 grams of protein   NUTRITION DIAGNOSIS:   Inadequate oral intake related to acute illness as evidenced by per patient/family report.  GOAL:   Patient will meet greater than or equal to 90% of their needs  MONITOR:   PO intake, Supplement acceptance, Labs, Weight trends, I & O's  REASON FOR ASSESSMENT:   Consult COPD Protocol  ASSESSMENT:    Pt admitted with SOB secondary to COPD.  Past Medical History  Diagnosis Date  . COPD (chronic obstructive pulmonary disease) (HCC)      Diet Order:  Diet regular Room service appropriate?: Yes; Fluid consistency:: Thin    Current Nutrition: Pt  Reports eating about half of her breakfast this am and reports ordering tuna salad sandwich with sherbet for lunch.   Food/Nutrition-Related History: Pt reports her appetite has been down the past few days. Pt reports 'not wanting to eat.' Recorded po intake since admission 53% of meals. Previously pt reports 'I'm not a big eater usually,' but she did report having an appetite as usual.   Scheduled Medications:  . acidophilus  1 capsule Oral Daily  . azithromycin  250 mg Oral Daily  . budesonide (PULMICORT) nebulizer solution  0.5 mg Nebulization BID  . chlorpheniramine-HYDROcodone  5 mL Oral Q12H  . enoxaparin (LOVENOX) injection  40 mg Subcutaneous Q24H  . feeding supplement (ENSURE ENLIVE)  237 mL Oral Q24H  . ipratropium-albuterol  3 mL Nebulization Q4H  . methylPREDNISolone (SOLU-MEDROL) injection  40 mg Intravenous Q6H  . mometasone-formoterol  2 puff Inhalation BID  . montelukast  10 mg Oral Daily  . polyethylene glycol  17 g Oral Daily  . senna-docusate  2 tablet Oral BID  . sodium chloride flush  3 mL Intravenous Q12H  . tiotropium  18 mcg Inhalation Daily  . zolpidem   5 mg Oral QHS     Electrolyte/Renal Profile and Glucose Profile:   Recent Labs Lab 08/16/15 0739 08/17/15 0533 08/20/15 0412  NA 130* 139 131*  K 3.8 3.9 4.2  CL 101 110 99*  CO2 20* 22 24  BUN 22* 15 22*  CREATININE 1.00 0.88 1.03*  CALCIUM 8.6* 8.7* 8.5*  GLUCOSE 128* 159* 144*   Protein Profile:   Recent Labs Lab 08/16/15 0739  ALBUMIN 4.4    Gastrointestinal Profile: Last BM: 08/16/2015   Nutrition-Focused Physical Exam Findings: Nutrition-Focused physical exam completed. Findings are WDL for fat depletion, muscle depletion, and edema.    Weight Change: Pt reports stable weight PTA.   Height:   Ht Readings from Last 1 Encounters:  08/16/15 5\' 5"  (1.651 m)    Weight:   Wt Readings from Last 1 Encounters:  08/16/15 137 lb 12.8 oz (62.506 kg)     BMI:  Body mass index is 22.93 kg/(m^2).  Estimated Nutritional Needs:   Kcal:  BEE: 1145kcals, TEE: (IF 1.1-1.3)(AF 1.2) 1513-1787kcals  Protein:  63-75g protein (1.0-1.2g/kg)  Fluid:  1563-187275mL of fluid (25-6630mL/kg)  EDUCATION NEEDS:   No education needs identified at this time   MODERATE Care Level  Leda QuailAllyson Lillyahna Hemberger, RD, LDN Pager [phone] Weekend/On-Call Pager [phone]

## 2015-08-20 NOTE — Evaluation (Signed)
Physical Therapy Evaluation Patient Details Name: Stacey Durham MRN: [id number] DOB: 02-Sep-1945 Today's Date: 08/20/2015   History of Present Illness  Patient reports she started to feel short of breath recently and felt it was due to allergies/pollen, she tried to get in to see her regular doctor and they did not have any openings and referred her to the clinic.  She waited a bit longer and had increased back pain so she had her significant other bring her to the ER.    Clinical Impression  Patient is a pleasant 70 y/o female here with COPD exacerbation. She is able to perform all mobility independently, including single leg stance to put on her robe with no loss of balance. She does maintain elevated HR (112-121) and O2 sats 88-90% on 3L of O2 (85% on room air at rest) in this session. She is primarily limited currently by her cardiopulmonary status as she is typically quite active and able to complete activities like skiing. Acute PT needs are limited to endurance and aerobic capacity, she is most appropriate for pulmonary rehab after medically stable for discharge.     Follow Up Recommendations No PT follow up (Lung works or pulmonary rehab)    Equipment Recommendations       Recommendations for Other Services       Precautions / Restrictions Precautions Precautions: None Restrictions Weight Bearing Restrictions: No      Mobility  Bed Mobility Overal bed mobility: Independent             General bed mobility comments: No deficits observed   Transfers Overall transfer level: Independent               General transfer comment: No deficits observed   Ambulation/Gait Ambulation/Gait assistance: Independent Ambulation Distance (Feet): 400 Feet Assistive device: None Gait Pattern/deviations: WFL(Within Functional Limits)   Gait velocity interpretation: at or above normal speed for age/gender General Gait Details: No gait deficits observed, see notes for O2  sats.    SaO2 on room air at rest = 85% SaO2 on room air while ambulating = % SaO2 on 3 liters of O2 while ambulating = 90%   Stairs            Wheelchair Mobility    Modified Rankin (Stroke Patients Only)       Balance Overall balance assessment: No apparent balance deficits (not formally assessed)                                           Pertinent Vitals/Pain Pain Assessment: No/denies pain    Home Living Family/patient expects to be discharged to:: Private residence Living Arrangements: Spouse/significant other Available Help at Discharge: Friend(s) Type of Home: Mobile home Home Access: Stairs to enter   [employer] of Steps: 4 steps to enter Home Layout: One level Home Equipment: None      Prior Function Level of Independence: Independent         Comments: Patient reports she was independent with all tasks prior to admission and had been snow skiing just a month ago.  She reports being active and outdoors most of the time.  She is retired.      Hand Dominance   Dominant Hand: Right    Extremity/Trunk Assessment   Upper Extremity Assessment: Overall WFL for tasks assessed  Lower Extremity Assessment: Overall WFL for tasks assessed         Communication   Communication: No difficulties  Cognition Arousal/Alertness: Awake/alert Behavior During Therapy: WFL for tasks assessed/performed Overall Cognitive Status: Within Functional Limits for tasks assessed                      General Comments      Exercises        Assessment/Plan    PT Assessment Patient needs continued PT services  PT Diagnosis Other (comment) (Decreased O2 perfusion in ambulation)   PT Problem List Cardiopulmonary status limiting activity  PT Treatment Interventions Gait training;Therapeutic activities;Therapeutic exercise   PT Goals (Current goals can be found in the Care Plan section) Acute Rehab PT  Goals Patient Stated Goal: Patient wants to return home and be independent. PT Goal Formulation: With patient Time For Goal Achievement: 09/03/15 Potential to Achieve Goals: Good    Frequency Min 2X/week   Barriers to discharge        Co-evaluation               End of Session Equipment Utilized During Treatment: Oxygen Activity Tolerance: Patient tolerated treatment well Patient left: in bed;with call bell/phone within reach (MD in room) Nurse Communication: Mobility status         Time: 1120-1130 PT Time Calculation (min) (ACUTE ONLY): 10 min   Charges:   PT Evaluation $PT Eval Low Complexity: 1 Procedure     PT G Codes:       Kerin RansomPatrick A Jamy Whyte, PT, DPT    08/20/2015, 11:33 AM

## 2015-08-20 NOTE — Consult Note (Signed)
[facility] Consultation      Date: 08/20/2015,   MRN# [id number] Stacey Durham 1946/04/08 Code Status:     Code Status Orders        Start     Ordered   08/16/15 1134  Full code   Continuous     08/16/15 1133    Code Status History    Date Active Date Inactive Code Status Order ID Comments User Context   This patient has a current code status but no historical code status.     Hosp day:@LENGTHOFSTAYDAYS @ Referring MD: @     PCP:      AdmissionWeight: 137 lb 12.8 oz (62.506 kg)                 CurrentWeight: 137 lb 12.8 oz (62.506 kg) Stacey Durham is a 70 y.o. old female seen in consultation for COPD at the request of Dr. Sherryll Burger     CHIEF COMPLAINT:  Acute resp distress    HISTORY OF PRESENT ILLNESS   70 yo white female with COPD admitted last week for acute resp distress associated with increased WOb/increased SOB with cough These symptoms had been going on for approx 1 week PTA, patient had been using her Pro air and was not getting better.  Patient still has coughing and wheezing, feels  That she is not getting better, she has been on IV steroids and ABX She states that she quit smoking in the 90's  She states that she had a cold several weeks ago, but denies fevers, chills.    PAST MEDICAL HISTORY   Past Medical History  Diagnosis Date  . COPD (chronic obstructive pulmonary disease) (HCC)      SURGICAL HISTORY   Past Surgical History  Procedure Laterality Date  . Abdominal hysterectomy    . Tonsillectomy    . Appendectomy       FAMILY HISTORY   History reviewed. No pertinent family history.   SOCIAL HISTORY   Social History  Substance Use Topics  . Smoking status: Never Smoker   . Smokeless tobacco: None  . Alcohol Use: No  quit tobacco use 90's a former smoker   MEDICATIONS    Home Medication:  No current outpatient prescriptions on file.  Current Medication:  Current  facility-administered medications:  .  acetaminophen (TYLENOL) tablet 650 mg, 650 mg, Oral, Q6H PRN **OR** acetaminophen (TYLENOL) suppository 650 mg, 650 mg, Rectal, Q6H PRN, Adrian Saran, MD .  acidophilus (RISAQUAD) capsule 1 capsule, 1 capsule, Oral, Daily, Adrian Saran, MD, 1 capsule at 08/20/15 0859 .  ALPRAZolam (XANAX) tablet 0.25 mg, 0.25 mg, Oral, BID PRN, Adrian Saran, MD, 0.25 mg at 08/19/15 1618 .  alum & mag hydroxide-simeth (MAALOX/MYLANTA) 200-200-20 MG/5ML suspension 30 mL, 30 mL, Oral, Q6H PRN, Adrian Saran, MD .  azithromycin (ZITHROMAX) tablet 250 mg, 250 mg, Oral, Daily, Vipul Shah, MD, 250 mg at 08/19/15 1618 .  budesonide (PULMICORT) nebulizer solution 0.25 mg, 0.25 mg, Nebulization, BID, Alford Highland, MD, 0.25 mg at 08/20/15 0758 .  chlorpheniramine-HYDROcodone (TUSSIONEX) 10-8 MG/5ML suspension 5 mL, 5 mL, Oral, Q12H, Delfino Lovett, MD, 5 mL at 08/20/15 0859 .  enoxaparin (LOVENOX) injection 40 mg, 40 mg, Subcutaneous, Q24H, Sital Mody, MD, 40 mg at 08/16/15 2200 .  feeding supplement (ENSURE ENLIVE) (ENSURE ENLIVE) liquid 237 mL, 237 mL, Oral, Q24H, Vipul Shah, MD .  guaiFENesin (ROBITUSSIN) 100 MG/5ML solution 100 mg, 100 mg, Oral, Q6H PRN, Robley Fries, MD, 100 mg at 08/19/15  [email or web address] .  ipratropium-albuterol (DUONEB) 0.5-2.5 (3) MG/3ML nebulizer solution 3 mL, 3 mL, Nebulization, QID, Adrian SaranSital Mody, MD, 3 mL at 08/20/15 0758 .  ipratropium-albuterol (DUONEB) 0.5-2.5 (3) MG/3ML nebulizer solution 3 mL, 3 mL, Nebulization, Q4H PRN, Adrian SaranSital Mody, MD, 3 mL at 08/19/15 0855 .  methylPREDNISolone sodium succinate (SOLU-MEDROL) 40 mg/mL injection 40 mg, 40 mg, Intravenous, Q6H, Vipul Shah, MD, 40 mg at 08/20/15 1138 .  montelukast (SINGULAIR) tablet 10 mg, 10 mg, Oral, Daily, Adrian SaranSital Mody, MD, 10 mg at 08/20/15 0859 .  ondansetron (ZOFRAN) tablet 4 mg, 4 mg, Oral, Q6H PRN **OR** ondansetron (ZOFRAN) injection 4 mg, 4 mg, Intravenous, Q6H PRN, Adrian SaranSital Mody, MD, 4 mg at 08/17/15 0722 .   polyethylene glycol (MIRALAX / GLYCOLAX) packet 17 g, 17 g, Oral, Daily, Vipul Shah, MD, 17 g at 08/20/15 0859 .  senna-docusate (Senokot-S) tablet 2 tablet, 2 tablet, Oral, BID, Delfino LovettVipul Shah, MD, 2 tablet at 08/20/15 0859 .  sodium chloride flush (NS) 0.9 % injection 3 mL, 3 mL, Intravenous, Q12H, Sital Mody, MD, 3 mL at 08/20/15 1053 .  tiotropium (SPIRIVA) inhalation capsule 18 mcg, 18 mcg, Inhalation, Daily, Adrian SaranSital Mody, MD, 18 mcg at 08/20/15 1053 .  zolpidem (AMBIEN) tablet 5 mg, 5 mg, Oral, QHS, Alford Highlandichard Wieting, MD, 5 mg at 08/19/15 2133    ALLERGIES   Review of patient's allergies indicates no known allergies.     REVIEW OF SYSTEMS   Review of Systems  Constitutional: Negative for fever, chills, weight loss and malaise/fatigue.  HENT: Negative for congestion and hearing loss.   Eyes: Negative for blurred vision and double vision.  Respiratory: Positive for cough, shortness of breath and wheezing. Negative for hemoptysis and sputum production.   Cardiovascular: Negative for chest pain, palpitations, orthopnea and leg swelling.  Gastrointestinal: Negative for heartburn, nausea, vomiting and abdominal pain.  Genitourinary: Negative for dysuria and urgency.  Musculoskeletal: Negative for myalgias, back pain and neck pain.  Skin: Negative for rash.  Neurological: Negative for dizziness, tingling and headaches.  Endo/Heme/Allergies: Does not bruise/bleed easily.  Psychiatric/Behavioral: Negative for depression, suicidal ideas and substance abuse. The patient is nervous/anxious.   All other systems reviewed and are negative.    VS: BP 148/79 mmHg  Pulse 76  Temp(Src) 97.7 F (36.5 C) (Oral)  Resp 20  Ht 5\' 5"  (1.651 m)  Wt 137 lb 12.8 oz (62.506 kg)  BMI 22.93 kg/m2  SpO2 92%     PHYSICAL EXAM  Physical Exam  Constitutional: She is oriented to person, place, and time. She appears well-developed and well-nourished. No distress.  HENT:  Head: Normocephalic and  atraumatic.  Mouth/Throat: No oropharyngeal exudate.  Eyes: EOM are normal. Pupils are equal, round, and reactive to light. No scleral icterus.  Neck: Normal range of motion. Neck supple.  Cardiovascular: Normal rate, regular rhythm and normal heart sounds.   No murmur heard. Pulmonary/Chest: No stridor. She is in respiratory distress. She has wheezes.  Mild resp distress  Abdominal: Soft. Bowel sounds are normal.  Musculoskeletal: Normal range of motion. She exhibits no edema.  Neurological: She is alert and oriented to person, place, and time.  Skin: Skin is warm. She is not diaphoretic.  Psychiatric: She has a normal mood and affect.        LABS    Recent Labs     08/20/15  0412  HGB  13.5  HCT  39.9  MCV  88.7  WBC  11.8*  BUN  22*  CREATININE  1.03*  GLUCOSE  144*  CALCIUM  8.5*  ,      IMAGING    Dg Chest Portable 1 View  08/16/2015  CLINICAL DATA:  Worsening cough and shortness of breath that began 1 day prior. History of COPD and smoking. EXAM: PORTABLE CHEST 1 VIEW COMPARISON:  None. FINDINGS: Normal cardiac silhouette and mediastinal contours with atherosclerotic plaque within the thoracic aorta. The lungs appear hyperexpanded with flattening of the diaphragms. Bibasilar linear heterogeneous opacities favored to represent atelectasis. No focal airspace opacities. No pleural effusion or pneumothorax. No evidence of edema. No acute osseus abnormalities. IMPRESSION: Lung hyperexpansion and bibasilar atelectasis without definitive acute cardiopulmonary disease on this AP portable examination. Further evaluation with a PA and lateral chest radiograph may be obtained as clinically indicated. Electronically Signed   By: Simonne Come M.D.   On: 08/16/2015 07:48   CXR on 3/17 images reviewed 08/20/2015     ASSESSMENT/PLAN   70 yo white female with acute COPD exacerbation likely from acute viral bronchitis/atypical pneumonia  1.continue oxygen as needed to keep o2  sats 88-92% 2.cotninue IV steroids 3.continue abx 4.aggressive BD therapy-dounebs every 4 hrs 5.increased dose of pulmicort nebs  Will follow up and assess resp status  I have personally obtained a history, examined the patient, evaluated laboratory and independently reviewed imaging results, formulated the assessment and plan and placed orders.  The Patient requires high complexity decision making for assessment and support, frequent evaluation and titration of therapies, application of advanced monitoring technologies and extensive interpretation of multiple databases.    Patient satisfied with Plan of action and management. All questions answered  Lucie Leather, M.D.  [name]  Medical Director [facility] Medical Director [facility] Cardio-Pulmonary Department

## 2015-08-20 NOTE — Clinical Social Work Note (Signed)
Clinical Social Worker consulted for COPD Gold. This is pt's first admission. PT is not recommending follow up. CSW will sign off at this time. Please reconsult if a need arises prior to discharge.   Dede QuerySarah Aubra Pappalardo, MSW, LCSW  Clinical Social Worker  [phone]

## 2015-08-20 NOTE — Evaluation (Signed)
Occupational Therapy Evaluation Patient Details Name: Stacey Durham MRN: [id number] DOB: August 11, 1945 Today's Date: 08/20/2015    History of Present Illness Patient reports she started to feel short of breath recently and felt it was due to allergies/pollen, she tried to get in to see her regular doctor and they did not have any openings and referred her to the clinic.  She waited a bit longer and had increased back pain so she had her significant other bring her to the ER.     Clinical Impression   Patient seen for OT evaluation this date.  She presents with shortness of breath, now on 1L of O2, does not have oxygen at home.  In the hospital setting, she is able to demonstrate independent transfers from bed, chair and toilet.  She is able to demonstrate independent self care tasks including dressing, grooming and toileting during session.  She does not need any adaptive equipment at this time and she is aware of energy conservation techniques during self care and IADL tasks.  She does not require any additional OT services at this time, evaluation only.      Follow Up Recommendations  No OT follow up    Equipment Recommendations  None recommended by OT    Recommendations for Other Services       Precautions / Restrictions Precautions Precautions: None Restrictions Weight Bearing Restrictions: No      Mobility Bed Mobility Overal bed mobility: Independent                Transfers Overall transfer level: Independent               General transfer comment: Patient is independent from bed, chair and toilet for transfers safely.      Balance                                            ADL Overall ADL's : Independent                                       General ADL Comments: Patient able to demo dressing tasks, toileting during session this date with no assistance required.  She is aware of energy conservation techniques and  can verbalize them.       Vision Vision Assessment?: No apparent visual deficits   Perception     Praxis      Pertinent Vitals/Pain Pain Assessment: No/denies pain     Hand Dominance Right   Extremity/Trunk Assessment Upper Extremity Assessment Upper Extremity Assessment: Overall WFL for tasks assessed   Lower Extremity Assessment Lower Extremity Assessment: Defer to PT evaluation       Communication Communication Communication: No difficulties   Cognition Arousal/Alertness: Awake/alert   Overall Cognitive Status: Within Functional Limits for tasks assessed                     General Comments       Exercises       Shoulder Instructions      Home Living Family/patient expects to be discharged to:: Private residence Living Arrangements: Spouse/significant other Available Help at Discharge: Friend(s) Type of Home: Mobile home Home Access: Stairs to enter Entergy CorporationEntrance Stairs-Number of Steps: 4 steps to enter   Home Layout: One level  Bathroom Shower/Tub: Psychologist, counselling;Door   [employer] Toilet: Standard Bathroom Accessibility: Yes   Home Equipment: None          Prior Functioning/Environment Level of Independence: Independent        Comments: Patient reports she was independent with all tasks prior to admission and had been snow skiing just a month ago.  She reports being active and outdoors most of the time.  She is retired.     OT Diagnosis:     OT Problem List:     OT Treatment/Interventions:      OT Goals(Current goals can be found in the care plan section) Acute Rehab OT Goals Patient Stated Goal: Patient wants to return home and be independent.  OT Frequency:     Barriers to D/C:            Co-evaluation              End of Session    Activity Tolerance: Patient tolerated treatment well Patient left: in bed   Time: 1025-1054 OT Time Calculation (min): 29 min Charges:  OT General Charges $OT Visit: 1  Procedure OT Evaluation $OT Eval Low Complexity: 1 Procedure G-Codes:    Lovett,Amy  Amy T Lovett, OTR/L, CLT 08/20/2015, 11:09 AM

## 2015-08-21 DIAGNOSIS — J441 Chronic obstructive pulmonary disease with (acute) exacerbation: Secondary | ICD-10-CM

## 2015-08-21 MED ORDER — PREDNISONE 20 MG PO TABS: 40.0000 mg | ORAL_TABLET | Freq: Every day | ORAL | Status: DC

## 2015-08-21 MED ORDER — POLYETHYLENE GLYCOL 3350 17 G PO PACK: 17.0000 g | PACK | Freq: Every day | ORAL | Status: DC

## 2015-08-21 MED ORDER — ENSURE ENLIVE PO LIQD: 237.0000 mL | ORAL | Status: DC

## 2015-08-21 MED ORDER — TIOTROPIUM BROMIDE MONOHYDRATE 18 MCG IN CAPS: 18.0000 ug | ORAL_CAPSULE | Freq: Every day | RESPIRATORY_TRACT | Status: DC

## 2015-08-21 MED ORDER — PREDNISONE 10 MG (21) PO TBPK: 10.0000 mg | ORAL_TABLET | Freq: Every day | ORAL | Status: DC

## 2015-08-21 MED ORDER — AZITHROMYCIN 500 MG PO TABS: 500.0000 mg | ORAL_TABLET | Freq: Every day | ORAL | Status: DC

## 2015-08-21 MED ORDER — GUAIFENESIN 100 MG/5ML PO SOLN: 100.0000 mg | Freq: Four times a day (QID) | ORAL | Status: DC | PRN

## 2015-08-21 NOTE — Discharge Instructions (Signed)
Chronic Obstructive Pulmonary Disease °Chronic obstructive pulmonary disease (COPD) is a common lung problem. In COPD, the flow of air from the lungs is limited. The way your lungs work will probably never return to normal, but there are things you can do to improve your lungs and make yourself feel better. Your doctor may treat your condition with: °· Medicines. °· Oxygen. °· Lung surgery. °· Changes to your diet. °· Rehabilitation. This may involve a team of specialists. °HOME CARE °· Take all medicines as told by your doctor. °· Avoid medicines or cough syrups that dry up your airway (such as antihistamines) and do not allow you to get rid of thick spit. You do not need to avoid them if told differently by your doctor. °· If you smoke, stop. Smoking makes the problem worse. °· Avoid being around things that make your breathing worse (like smoke, chemicals, and fumes). °· Use oxygen therapy and therapy to help improve your lungs (pulmonary rehabilitation) if told by your doctor. If you need home oxygen therapy, ask your doctor if you should buy a tool to measure your oxygen level (oximeter). °· Avoid people who have a sickness you can catch (contagious). °· Avoid going outside when it is very hot, cold, or humid. °· Eat healthy foods. Eat smaller meals more often. Rest before meals. °· Stay active, but remember to also rest. °· Make sure to get all the shots (vaccines) your doctor recommends. Ask your doctor if you need a pneumonia shot. °· Learn and use tips on how to relax. °· Learn and use tips on how to control your breathing as told by your doctor. Try: °¨ Breathing in (inhaling) through your nose for 1 second. Then, pucker your lips and breath out (exhale) through your lips for 2 seconds. °¨ Putting one hand on your belly (abdomen). Breathe in slowly through your nose for 1 second. Your hand on your belly should move out. Pucker your lips and breathe out slowly through your lips. Your hand on your belly  should move in as you breathe out. °· Learn and use controlled coughing to clear thick spit from your lungs. The steps are: °1. Lean your head a little forward. °2. Breathe in deeply. °3. Try to hold your breath for 3 seconds. °4. Keep your mouth slightly open while coughing 2 times. °5. Spit any thick spit out into a tissue. °6. Rest and do the steps again 1 or 2 times as needed. °GET HELP IF: °· You cough up more thick spit than usual. °· There is a change in the color or thickness of the spit. °· It is harder to breathe than usual. °· Your breathing is faster than usual. °GET HELP RIGHT AWAY IF: °· You have shortness of breath while resting. °· You have shortness of breath that stops you from: °¨ Being able to talk. °¨ Doing normal activities. °· You chest hurts for longer than 5 minutes. °· Your skin color is more blue than usual. °· Your pulse oximeter shows that you have low oxygen for longer than 5 minutes. °MAKE SURE YOU: °· Understand these instructions. °· Will watch your condition. °· Will get help right away if you are not doing well or get worse. °  °This information is not intended to replace advice given to you by your health care provider. Make sure you discuss any questions you have with your health care provider. °  °Document Released: 11/04/2007 Document Revised: 06/08/2014 Document Reviewed: 01/12/2013 °Elsevier Interactive Patient   Education ©2016 [employer] ° °

## 2015-08-21 NOTE — Care Management Important Message (Signed)
Important Message  Patient Details  Name: Stacey Durham MRN: [id number] Date of Birth: 1946/04/26   Medicare Important Message Given:  Yes    [name] 08/21/2015, 10:44 AM

## 2015-08-21 NOTE — Care Management (Signed)
Admitted to this facility with the diagnosis if COPD. Stacey PeltonFriend Stacey Durham lives with her [phone]).  Will be seeing Stacey Durham at [facility] for follow-up appointment. No home health. No skilled facility. No home oxygen. Uses no aids for ambulation. Takes care of all basic and instrumental activities of daily living herself, drives. No falls. Decreased appetite. Friend will drive home. Qualifies for home oxygen and the diagnosis of COPD. Would like [facility] for Home oxygen (they gave her a nebulizer). Will Dareen PianoAnderson, [facility] representative for Durable Medical Equipment updated. Discharge to home today per Dr. Sherryll BurgerShah. Gwenette GreetBrenda S Lalanya Rufener RN MSN CCM Care Management [phone]

## 2015-08-21 NOTE — Progress Notes (Signed)
Pt for discharge home with home 02 at 2l [place]. Alert.  Some shortness of breath  With increased exertion.  Discharge instructions discussed with pt. presc gone over with pt. Called to pts pharmacy per md.  Home meds discussed.  Diet activity and f/u discussed.   Home 02 tank discussed. Pt waiting for friend to pick her up.

## 2015-08-21 NOTE — Progress Notes (Signed)
SATURATION QUALIFICATIONS: (This note is used to comply with regulatory documentation for home oxygen)  Patient Saturations on Room Air at Rest = 86%  Patient Saturations on Room Air while Ambulating =84%  Patient Saturations on 2 liters of oxygen while Ambulating = 91%  Please briefly explain why patient needs home oxygen: sats drop  While at rest on room air

## 2015-08-21 NOTE — Progress Notes (Signed)
[facility]     Assessment and Plan:  -Acute exacerbation of COPD, with acute bronchitis. -Appears to be doing better, okay to discharge from respiratory standpoint. Steroids were changed from IV to oral today. -Patient may require home oxygen.  -Chronic respiratory failure with emphysema. -Would recommend a long-acting beta agonist, as well as Spiriva. -Will need outpatient follow-up with pulmonary function testing.  Patient appears to be doing significantly better, pulmonary service will sign off for now, please call if there are any further questions or concerns.   Date: 08/21/2015  MRN# [id number] Cedrica Brune November 29, 1945   Stacey Durham is a 70 y.o. old female seen in follow up for chief complaint of  Chief Complaint  Patient presents with  . Shortness of Breath    HPI:  The patient feels significantly improved today, and feels ready to go home. She has no new complaints today.   Review of Systems: Gen:  Denies  fever, sweats. HEENT: Denies blurred vision. Cvc:  No dizziness, chest pain or heaviness Resp:   Denies cough or sputum porduction. Gi: Denies swallowing difficulty, stomach pain.  Gu:  Denies bladder incontinence, burning urine Ext:   No Joint pain, stiffness. Skin: No skin rash, easy bruising. Endoc:  No polyuria, polydipsia. Psych: No depression, insomnia. Other:  All other systems were reviewed and found to be negative other than what is mentioned in the HPI.   Physical Examination:   VS: BP 139/82 mmHg  Pulse 88  Temp(Src) 97.5 F (36.4 C) (Oral)  Resp 22  Ht  (1.651 m)  Wt 137 lb 12.8 oz (62.506 kg)  BMI 22.93 kg/m2  SpO2 92%  General Appearance: No distress  Neuro:without focal findings,  speech normal,  HEENT: PERRLA, EOM intact. Pulmonary: normal breath sounds, No wheezing.   CardiovascularNormal S1,S2.  No m/r/g.   Abdomen: Benign, Soft, non-tender. Renal:  No costovertebral tenderness  GU:  Not  performed at this time. Endoc: No evident thyromegaly, no signs of acromegaly. Skin:   warm, no rash. Extremities: normal, no cyanosis, clubbing.   LABORATORY PANEL:   CBC  Recent Labs Lab 08/20/15 0412  WBC 11.8*  HGB 13.5  HCT 39.9  PLT 266   ------------------------------------------------------------------------------------------------------------------  Chemistries   Recent Labs Lab 08/16/15 0739  08/20/15 0412  NA 130*  < > 131*  K 3.8  < > 4.2  CL 101  < > 99*  CO2 20*  < > 24  GLUCOSE 128*  < > 144*  BUN 22*  < > 22*  CREATININE 1.00  < > 1.03*  CALCIUM 8.6*  < > 8.5*  AST 23  --   --   ALT 18  --   --   ALKPHOS 68  --   --   BILITOT 0.8  --   --   < > = values in this interval not displayed. ------------------------------------------------------------------------------------------------------------------  Cardiac Enzymes  Recent Labs Lab 08/16/15 0739  TROPONINI <0.03   ------------------------------------------------------------  RADIOLOGY:   No results found for this or any previous visit. Results for orders placed during the hospital encounter of 07/24/05  DG Chest 2 View   Narrative Clinical Data: Cough. Smoker.   CHEST - 2 VIEW:  Comparison: [facility] limited chest CT report 08/18/99.   Findings: No change in 7 mm ovoid calcified granuloma at the posterior left upper lobe. The lungs are otherwise clear. Heart size remains normal. Mediastinum, hilar, pleura, and osseous structures appear normal.   IMPRESSION:  1. Stable 7 mm posterior left upper lobe calcified granuloma.   2. No active disease.  Provider: Tenna ChildShannon Wedding   ------------------------------------------------------------------------------------------------------------------  Thank  you for allowing [facility] to assist in the care of your patient. Our recommendations are noted above.  Please contact us if we can be of further service.   Wells Guileseep  Cedrica Brune, MD.  [facility]  Santiago Gladavid Kasa, M.D.  Stephanie AcreVishal Mungal, M.D.  Billy Fischeravid Simonds, M.D

## 2015-08-24 NOTE — Discharge Summary (Addendum)
[facility] - [place] at [facility]   PATIENT NAME: Stacey Durham    MR#:  [id number]  DATE OF BIRTH:  05/18/1946  DATE OF ADMISSION:  08/16/2015 ADMITTING PHYSICIAN: Adrian Saran, MD  DATE OF DISCHARGE: 08/21/2015  4:27 PM  PRIMARY CARE PHYSICIAN: Eustaquio Boyden, MD   ADMISSION DIAGNOSIS:  Dyspnea [R06.00] COPD exacerbation (HCC) [J44.1]  DISCHARGE DIAGNOSIS:  Active Problems:   COPD (chronic obstructive pulmonary disease) (HCC)  SECONDARY DIAGNOSIS:   Past Medical History  Diagnosis Date  . COPD (chronic obstructive pulmonary disease) [facility])     HOSPITAL COURSE:  70 year old female with history of COPD who presents with acute COPD exacerbation.  1. COPD exacerbation: COPD Gold patient and remains at high risk for readmission. 2. Hyponatremia - sodium 131, likely due to poor by mouth intake, improved with hydration 3.Constipation: likely due to narcotic pain medications, stopped and on stool softners 4. Chest pain likely secondary COPD exacerbation 5. Insomnia- Ambien prn 6. Dysphagia: outpt GI eval  Pneumonia ruled out  Feeling better and D/C home in stable condition.  DISCHARGE CONDITIONS:   STABLE  CONSULTS OBTAINED:  Treatment Team:  Erin Fulling, MD  DRUG ALLERGIES:  No Known Allergies  DISCHARGE MEDICATIONS:   Discharge Medication List as of 08/21/2015  9:44 AM    START taking these medications   Details  azithromycin (ZITHROMAX) 500 MG tablet Take 1 tablet (500 mg total) by mouth daily., Starting 08/21/2015, Until Discontinued, Normal    feeding supplement, ENSURE ENLIVE, (ENSURE ENLIVE) LIQD Take 237 mLs by mouth daily., Starting 08/21/2015, Until Discontinued, Normal    guaiFENesin (ROBITUSSIN) 100 MG/5ML SOLN Take 5 mLs (100 mg total) by mouth every 6 (six) hours as needed., Starting 08/21/2015, Until Discontinued, Normal    polyethylene glycol (MIRALAX / GLYCOLAX) packet Take 17 g by mouth daily., Starting  08/21/2015, Until Discontinued, Normal    predniSONE (STERAPRED UNI-PAK 21 TAB) 10 MG (21) TBPK tablet Take 1 tablet (10 mg total) by mouth daily. 60 mg daily, taper 10 mg daily until done., Starting 08/21/2015, Until Discontinued, Normal    tiotropium (SPIRIVA) 18 MCG inhalation capsule Place 1 capsule (18 mcg total) into inhaler and inhale daily., Starting 08/21/2015, Until Discontinued, Normal      CONTINUE these medications which have NOT CHANGED   Details  acidophilus (RISAQUAD) CAPS capsule Take 1 capsule by mouth daily., Until Discontinued, Historical Med    albuterol (PROVENTIL) (2.5 MG/3ML) 0.083% nebulizer solution Take 1 vial by nebulization as needed., Starting 08/15/2015, Until Discontinued, Historical Med    calcium-vitamin D (OSCAL WITH D) 500-200 MG-UNIT tablet Take 1 tablet by mouth., Until Discontinued, Historical Med    montelukast (SINGULAIR) 10 MG tablet Take 1 tablet by mouth daily., Starting 07/11/2015, Until Discontinued, Historical Med    PROAIR HFA 108 (90 Base) MCG/ACT inhaler Inhale 2 puffs into the lungs as needed., Starting 06/05/2015, Until Discontinued, Historical Med         DISCHARGE INSTRUCTIONS:    DIET:  Regular diet  DISCHARGE CONDITION:  Good  ACTIVITY:  Activity as tolerated  OXYGEN:  Home Oxygen: Yes.     Oxygen Delivery: 2 liters/min via Patient connected to nasal cannula oxygen  DISCHARGE LOCATION:  home   If you experience worsening of your admission symptoms, develop shortness of breath, life threatening emergency, suicidal or homicidal thoughts you must seek medical attention immediately by calling 911 or calling your MD immediately  if symptoms less severe.  You Must read complete instructions/literature  along with all the possible adverse reactions/side effects for all the Medicines you take and that have been prescribed to you. Take any new Medicines after you have completely understood and accpet all the possible adverse  reactions/side effects.   Please note  You were cared for by a hospitalist during your hospital stay. If you have any questions about your discharge medications or the care you received while you were in the hospital after you are discharged, you can call the unit and asked to speak with the hospitalist on call if the hospitalist that took care of you is not available. Once you are discharged, your primary care physician will handle any further medical issues. Please note that NO REFILLS for any discharge medications will be authorized once you are discharged, as it is imperative that you return to your primary care physician (or establish a relationship with a primary care physician if you do not have one) for your aftercare needs so that they can reassess your need for medications and monitor your lab values.    On the day of Discharge:  VITAL SIGNS:  Blood pressure 139/82, pulse 88, temperature 97.5 F (36.4 C), temperature source Oral, resp. rate 22, height 5\' 5"  (1.651 m), weight 62.506 kg (137 lb 12.8 oz), SpO2 92 %.  PHYSICAL EXAMINATION:  GENERAL:  70 y.o.-year-old patient lying in the bed with no acute distress.  EYES: Pupils equal, round, reactive to light and accommodation. No scleral icterus. Extraocular muscles intact.  HEENT: Head atraumatic, normocephalic. Oropharynx and nasopharynx clear.  NECK:  Supple, no jugular venous distention. No thyroid enlargement, no tenderness.  LUNGS: Normal breath sounds bilaterally, no wheezing, rales,rhonchi or crepitation. No use of accessory muscles of respiration.  CARDIOVASCULAR: S1, S2 normal. No murmurs, rubs, or gallops.  ABDOMEN: Soft, non-tender, non-distended. Bowel sounds present. No organomegaly or mass.  EXTREMITIES: No pedal edema, cyanosis, or clubbing.  NEUROLOGIC: Cranial nerves II through XII are intact. Muscle strength 5/5 in all extremities. Sensation intact. Gait not checked.  PSYCHIATRIC: The patient is alert and oriented x  3.  SKIN: No obvious rash, lesion, or ulcer.  DATA REVIEW:   CBC  Recent Labs Lab 08/20/15 0412  WBC 11.8*  HGB 13.5  HCT 39.9  PLT 266    Chemistries   Recent Labs Lab 08/20/15 0412  NA 131*  K 4.2  CL 99*  CO2 24  GLUCOSE 144*  BUN 22*  CREATININE 1.03*  CALCIUM 8.5*    Follow-up Information    Follow up with Eustaquio BoydenJavier Gutierrez, MD On 01/07/2016.   Specialty:  Family Medicine   Why:  at 2:00   Contact information:   [address] [place] [address] [phone]       [facility], Ezzard StandingPAUL Y, MD On 09/16/2015.   Specialty:  Internal Medicine   Why:  at 2:00   Contact information:   [address] [facility] [place] [address] [phone]       Follow up with Erin FullingKurian Kasa, MD On 09/09/2015.   Specialties:  Pulmonary Disease, Cardiology   Why:  at 10:10   Contact information:   [address] [place] [address] [phone]       Follow up with Morrie Sheldonlark,Katherine Kendal, NP On 08/30/2015.   Specialty:  Internal Medicine   Why:  at 1:00. Please bring your Insurance card and any medication that you are currently taking.   Contact information:   [address] [place] [address] [phone]        Management plans discussed with the patient, family and they are in agreement.  CODE STATUS:  Code Status History    Date Active Date Inactive Code Status Order ID Comments User Context   08/16/2015 11:33 AM 08/21/2015  7:28 PM Full Code [id number]  Adrian Saran, MD Inpatient      TOTAL TIME TAKING CARE OF THIS PATIENT: 45 minutes.    [facility], Corban Kistler M.D on 08/24/2015 at 1:16 PM  Between 7am to 6pm - Pager - [phone]  After 6pm go to [email or web address] - password [email or web address]  [facility]  Office  [phone]  CC: Primary care physician; Eustaquio Boyden, MD  Note: This dictation was prepared with Dragon dictation along with smaller phrase technology. Any transcriptional errors that  result from this process are unintentional.

## 2015-08-30 ENCOUNTER — Encounter: Payer: Self-pay | Admitting: Primary Care

## 2015-08-30 ENCOUNTER — Ambulatory Visit (INDEPENDENT_AMBULATORY_CARE_PROVIDER_SITE_OTHER): Payer: Medicare Other | Admitting: Primary Care

## 2015-08-30 VITALS — BP 114/70 | HR 81 | Temp 97.9°F | Ht 65.0 in | Wt 143.4 lb

## 2015-08-30 DIAGNOSIS — E871 Hypo-osmolality and hyponatremia: Secondary | ICD-10-CM

## 2015-08-30 DIAGNOSIS — J441 Chronic obstructive pulmonary disease with (acute) exacerbation: Secondary | ICD-10-CM | POA: Diagnosis not present

## 2015-08-30 LAB — CBC
HCT: 41.8 % (ref 36.0–46.0)
Hemoglobin: 13.9 g/dL (ref 12.0–15.0)
MCHC: 33.3 g/dL (ref 30.0–36.0)
MCV: 89.4 fl (ref 78.0–100.0)
Platelets: 295 10*3/uL (ref 150.0–400.0)
RBC: 4.68 Mil/uL (ref 3.87–5.11)
RDW: 13.4 % (ref 11.5–15.5)
WBC: 12.4 10*3/uL — ABNORMAL HIGH (ref 4.0–10.5)

## 2015-08-30 LAB — COMPREHENSIVE METABOLIC PANEL
ALT: 17 U/L (ref 0–35)
AST: 14 U/L (ref 0–37)
Albumin: 3.6 g/dL (ref 3.5–5.2)
Alkaline Phosphatase: 49 U/L (ref 39–117)
BILIRUBIN TOTAL: 0.5 mg/dL (ref 0.2–1.2)
BUN: 31 mg/dL — ABNORMAL HIGH (ref 6–23)
CHLORIDE: 101 meq/L (ref 96–112)
CO2: 27 meq/L (ref 19–32)
Calcium: 9.2 mg/dL (ref 8.4–10.5)
Creatinine, Ser: 0.96 mg/dL (ref 0.40–1.20)
GFR: 60.95 mL/min (ref >60.00)
GLUCOSE: 108 mg/dL — AB (ref 70–99)
Potassium: 4.2 mEq/L (ref 3.5–5.1)
Sodium: 134 mEq/L — ABNORMAL LOW (ref 135–145)
Total Protein: 6.1 g/dL (ref 6.0–8.3)

## 2015-08-30 NOTE — Addendum Note (Signed)
Addended by: Baldomero LamyHAVERS, Theodus Ran C on: 08/30/2015 04:22 PM   Modules accepted: Kipp BroodSmartSet

## 2015-08-30 NOTE — Progress Notes (Signed)
Pre visit review using our clinic review tool, if applicable. No additional management support is needed unless otherwise documented below in the visit note. 

## 2015-08-30 NOTE — Patient Instructions (Addendum)
Complete lab work prior to leaving today. I will notify you of your results once received.   Spirivia inhaler is to be used once everyday. Inhale 1 capsule by mouth every morning.  You may use the albuterol nebulized treatments as needed for shortness of breath. It's okay to wean down on the number of treatments daily.  Use the oxygen as needed for any shortness of breath. Today, you do not need the oxygen as your levels are stable.  Follow up with Pulmonology and GI as scheduled.  Please don't hesitate to call me if you have any questions.  It was a pleasure meeting you!

## 2015-08-30 NOTE — Progress Notes (Signed)
Subjective:    Patient ID: Stacey Durham, female    DOB: 10/06/1945, 70 y.o.   MRN: [id number]  HPI  [name] is a 70 year old female, new to our practice to establish with Dr. Sharen HonesGutierrez in August 2017, who presents today for hospital follow up.  [facility] on [facility]16/15 with a chief complaint of shortness of breath with difficulty catching her breath and wheezing. She has a medical history of COPD and quit smoking in the early 2000's. She was admitted for COPD exacerbation without improvement despite completion of 3 Duonebs and Solumedrol in the emergency department.   During her hospitalization she was provided with IV steriods, duoneb treatments with budesonide, antibiotic treatments and lab testing. Her labs showed hyponatremia that improved with IV fluid treatment, and hyperglycemia with a normal A1C. She started to feel improved with her respirations (continued cough) on March 21st. She was switched from IV Levaquin to oral Zithromax.  She underwent evaluation with pulmonology later on March 21st who initiated oxygen PRN for goal oxygen saturation of 88-92%, IV steroids, aggressive duonebs and pulmiocort nebs. She was re-evaluated on March 22nd per pulmonology who noticed improvement. They recommended Spiriva, oral steroids, and outpatient follow up for PFT's.  She was discharged later on March 22nd with hyponatremia, dysphagia, insomnia, improvement in COPD exacerbation, and constipation. She was set up for outpatient follow up with GI for evaluation of dysphagia (09/10/15), Pulmonology (09/09/15), and myself today. She was also sent home with home oxygen that she's not used since Wednesday this week.   Since her discharge she's feeling much improved. She's completed her oral steriods and antibiotics that were provided at discharge. She's currently managed on albuterol nebulized treatments three times daily, Spiriva inhaler once daily, Singulair daily. She's never  experienced an episode like this before.   She endorses dysphagia that started several days prior to hospitalization. She experienced a decrease in appetite without difficulty swallowing during hospitalization. She also had a difficult time sleeping during hospitalization with no prior history of at home.  She's recently had an improvement in appetite over the past 2 days. Denies aspiration or choking, or difficulty swallowing at this time.   Review of Systems  Constitutional: Negative for fever and appetite change.       Improving slowly  HENT: Negative for trouble swallowing.   Respiratory: Negative for cough and shortness of breath.   Cardiovascular: Negative for chest pain.  Neurological: Positive for tremors. Negative for dizziness, weakness and headaches.  Psychiatric/Behavioral: Positive for sleep disturbance.       Past Medical History  Diagnosis Date  . COPD (chronic obstructive pulmonary disease) (HCC)     Social History   Social History  . Marital Status: Widowed    Spouse Name: N/A  . Number of Children: N/A  . Years of Education: N/A   Occupational History  . Not on file.   Social History Main Topics  . Smoking status: Never Smoker   . Smokeless tobacco: Not on file  . Alcohol Use: No  . Drug Use: Not on file  . Sexual Activity: Not on file   Other Topics Concern  . Not on file   Social History Narrative    Past Surgical History  Procedure Laterality Date  . Abdominal hysterectomy    . Tonsillectomy    . Appendectomy      No family history on file.  No Known Allergies  Current Outpatient Prescriptions on File Prior to Visit  Medication Sig Dispense Refill  . acidophilus (RISAQUAD) CAPS capsule Take 1 capsule by mouth daily.    Marland Kitchen albuterol (PROVENTIL) (2.5 MG/3ML) 0.083% nebulizer solution Take 1 vial by nebulization as needed.    . calcium-vitamin D (OSCAL WITH D) 500-200 MG-UNIT tablet Take 1 tablet by mouth.    . feeding supplement, ENSURE  ENLIVE, (ENSURE ENLIVE) LIQD Take 237 mLs by mouth daily. 237 mL 12  . montelukast (SINGULAIR) 10 MG tablet Take 1 tablet by mouth daily.  3  . polyethylene glycol (MIRALAX / GLYCOLAX) packet Take 17 g by mouth daily. 14 each 0  . PROAIR HFA 108 (90 Base) MCG/ACT inhaler Inhale 2 puffs into the lungs as needed.  1  . tiotropium (SPIRIVA) 18 MCG inhalation capsule Place 1 capsule (18 mcg total) into inhaler and inhale daily. 30 capsule 12   No current facility-administered medications on file prior to visit.    BP 114/70 mmHg  Pulse 81  Temp(Src) 97.9 F (36.6 C) (Oral)  Ht  (1.651 m)  Wt 143 lb 6.4 oz (65.046 kg)  BMI 23.86 kg/m2  SpO2 94%    Objective:   Physical Exam  Constitutional: She appears well-nourished.  HENT:  Mouth/Throat: Oropharynx is clear and moist.  Neck: Neck supple.  Cardiovascular: Normal rate and regular rhythm.   Pulmonary/Chest: Effort normal and breath sounds normal. She has no wheezes.  Skin: Skin is warm and dry.          Assessment & Plan:  Hospital Follow Up:  Admitted to [facility] on March 17th for COPD exacerbation. Treated during hospitalization with IV steroids, antibiotics, IV fluids. Discharged home with Pulmonology and GI follow up. Also on Spiriva, albuterol nebulized treatments, and home oxygen. She's completed the oral steroids and antibiotics.  Since discharge feeling well. She understands to use her Spiriva daily. She had been using the albuterol nebulized treatments routinely, discussed that this was only PRN. Suspect this and her steroid treatments were causing insomnia that should improve her sleep disturbance.   She is aware of her follow up appointments with pulmonology and GI.   Noticed several abnormal labs at discharge. Repeat CBC, CMP today. Discussed to use home oxygen as needed. No need to use now as she is at goal with a saturation of 94% and is asymptomatic. She is to discuss oxygen use with pulmonology in April.    Welcomed her to our practice and provided her with my information in case she needed anything prior to her new patient appointment with PCP.   All hospital labs, imaging, and notes reviewed.

## 2015-09-02 ENCOUNTER — Encounter: Payer: Self-pay | Admitting: *Deleted

## 2015-09-09 ENCOUNTER — Encounter: Payer: Self-pay | Admitting: Internal Medicine

## 2015-09-09 ENCOUNTER — Ambulatory Visit (INDEPENDENT_AMBULATORY_CARE_PROVIDER_SITE_OTHER): Payer: Medicare Other | Admitting: Internal Medicine

## 2015-09-09 VITALS — BP 120/72 | HR 90 | Ht 64.0 in | Wt 148.4 lb

## 2015-09-09 DIAGNOSIS — J449 Chronic obstructive pulmonary disease, unspecified: Secondary | ICD-10-CM

## 2015-09-09 NOTE — Patient Instructions (Signed)
Chronic Obstructive Pulmonary Disease Chronic obstructive pulmonary disease (COPD) is a common lung condition in which airflow from the lungs is limited. COPD is a general term that can be used to describe many different lung problems that limit airflow, including both chronic bronchitis and emphysema. If you have COPD, your lung function will probably never return to normal, but there are measures you can take to improve lung function and make yourself feel better. CAUSES   Smoking (common).  Exposure to secondhand smoke.  Genetic problems.  Chronic inflammatory lung diseases or recurrent infections. SYMPTOMS  Shortness of breath, especially with physical activity.  Deep, persistent (chronic) cough with a large amount of thick mucus.  Wheezing.  Rapid breaths (tachypnea).  Gray or bluish discoloration (cyanosis) of the skin, especially in your fingers, toes, or lips.  Fatigue.  Weight loss.  Frequent infections or episodes when breathing symptoms become much worse (exacerbations).  Chest tightness. DIAGNOSIS Your health care provider will take a medical history and perform a physical examination to diagnose COPD. Additional tests for COPD may include:  Lung (pulmonary) function tests.  Chest X-ray.  CT scan.  Blood tests. TREATMENT  Treatment for COPD may include:  Inhaler and nebulizer medicines. These help manage the symptoms of COPD and make your breathing more comfortable.  Supplemental oxygen. Supplemental oxygen is only helpful if you have a low oxygen level in your blood.  Exercise and physical activity. These are beneficial for nearly all people with COPD.  Lung surgery or transplant.  Nutrition therapy to gain weight, if you are underweight.  Pulmonary rehabilitation. This may involve working with a team of health care providers and specialists, such as respiratory, occupational, and physical therapists. HOME CARE INSTRUCTIONS  Take all medicines  (inhaled or pills) as directed by your health care provider.  Avoid over-the-counter medicines or cough syrups that dry up your airway (such as antihistamines) and slow down the elimination of secretions unless instructed otherwise by your health care provider.  If you are a smoker, the most important thing that you can do is stop smoking. Continuing to smoke will cause further lung damage and breathing trouble. Ask your health care provider for help with quitting smoking. He or she can direct you to community resources or hospitals that provide support.  Avoid exposure to irritants such as smoke, chemicals, and fumes that aggravate your breathing.  Use oxygen therapy and pulmonary rehabilitation if directed by your health care provider. If you require home oxygen therapy, ask your health care provider whether you should purchase a pulse oximeter to measure your oxygen level at home.  Avoid contact with individuals who have a contagious illness.  Avoid extreme temperature and humidity changes.  Eat healthy foods. Eating smaller, more frequent meals and resting before meals may help you maintain your strength.  Stay active, but balance activity with periods of rest. Exercise and physical activity will help you maintain your ability to do things you want to do.  Preventing infection and hospitalization is very important when you have COPD. Make sure to receive all the vaccines your health care provider recommends, especially the pneumococcal and influenza vaccines. Ask your health care provider whether you need a pneumonia vaccine.  Learn and use relaxation techniques to manage stress.  Learn and use controlled breathing techniques as directed by your health care provider. Controlled breathing techniques include:  Pursed lip breathing. Start by breathing in (inhaling) through your nose for 1 second. Then, purse your lips as if you were   going to whistle and breathe out (exhale) through the  pursed lips for 2 seconds.  Diaphragmatic breathing. Start by putting one hand on your abdomen just above your waist. Inhale slowly through your nose. The hand on your abdomen should move out. Then purse your lips and exhale slowly. You should be able to feel the hand on your abdomen moving in as you exhale.  Learn and use controlled coughing to clear mucus from your lungs. Controlled coughing is a series of short, progressive coughs. The steps of controlled coughing are: 1. Lean your head slightly forward. 2. Breathe in deeply using diaphragmatic breathing. 3. Try to hold your breath for 3 seconds. 4. Keep your mouth slightly open while coughing twice. 5. Spit any mucus out into a tissue. 6. Rest and repeat the steps once or twice as needed. SEEK MEDICAL CARE IF:  You are coughing up more mucus than usual.  There is a change in the color or thickness of your mucus.  Your breathing is more labored than usual.  Your breathing is faster than usual. SEEK IMMEDIATE MEDICAL CARE IF:  You have shortness of breath while you are resting.  You have shortness of breath that prevents you from:  Being able to talk.  Performing your usual physical activities.  You have chest pain lasting longer than 5 minutes.  Your skin color is more cyanotic than usual.  You measure low oxygen saturations for longer than 5 minutes with a pulse oximeter. MAKE SURE YOU:  Understand these instructions.  Will watch your condition.  Will get help right away if you are not doing well or get worse.   This information is not intended to replace advice given to you by your health care provider. Make sure you discuss any questions you have with your health care provider.   Document Released: 02/25/2005 Document Revised: 06/08/2014 Document Reviewed: 01/12/2013 Elsevier Interactive Patient Education 2016 [employer]  

## 2015-09-09 NOTE — Progress Notes (Signed)
[facility] [place] Pulmonary Medicine Consultation     Date: 09/09/2015,   MRN# [id number] Stacey Durham January 11, 1946 Code Status:  Code Status History    Date Active Date Inactive Code Status Order ID Comments User Context   08/16/2015 11:33 AM 08/21/2015  7:28 PM Full Code [id number]  Adrian Saran, MD Inpatient     [facility] day:@LENGTHOFSTAYDAYS @ Referring MD: @     PCP:      AdmissionWeight: 148 lb 6.4 oz (67.314 kg)                 CurrentWeight: 148 lb 6.4 oz (67.314 kg)     CHIEF COMPLAINT:     Follow up COPD exacerbation    HISTORY OF PRESENT ILLNESS   70 yo white female with COPD admitted last month for acute resp distress associated with increased WOb/increased SOB with cough  patient had been using her Pro air and was not getting better. Patient dx with COPD exacerbation  Patient here for hospital follow up Patient has no complaints at this time, no fevers, chills, intermittent coughing no wheezing Uses spiriva daily, patient qiot smoking 1990     Current Medication:   Current outpatient prescriptions:  .  acidophilus (RISAQUAD) CAPS capsule, Take 1 capsule by mouth daily., Disp: , Rfl:  .  albuterol (PROVENTIL) (2.5 MG/3ML) 0.083% nebulizer solution, Take 1 vial by nebulization as needed., Disp: , Rfl:  .  calcium-vitamin D (OSCAL WITH D) 500-200 MG-UNIT tablet, Take 1 tablet by mouth., Disp: , Rfl:  .  feeding supplement, ENSURE ENLIVE, (ENSURE ENLIVE) LIQD, Take 237 mLs by mouth daily., Disp: 237 mL, Rfl: 12 .  montelukast (SINGULAIR) 10 MG tablet, Take 1 tablet by mouth daily., Disp: , Rfl: 3 .  polyethylene glycol (MIRALAX / GLYCOLAX) packet, Take 17 g by mouth daily., Disp: 14 each, Rfl: 0 .  PROAIR HFA 108 (90 Base) MCG/ACT inhaler, Inhale 2 puffs into the lungs as needed., Disp: , Rfl: 1 .  tiotropium (SPIRIVA) 18 MCG inhalation capsule, Place 1 capsule (18 mcg total) into inhaler and inhale daily., Disp: 30 capsule, Rfl: 12     ALLERGIES    Review of patient's allergies indicates no known allergies.     REVIEW OF SYSTEMS   Review of Systems  Constitutional: Negative for fever, chills, weight loss, malaise/fatigue and diaphoresis.  HENT: Negative for congestion and hearing loss.   Eyes: Negative for blurred vision and double vision.  Respiratory: Negative for cough, hemoptysis, sputum production, shortness of breath and wheezing.   Cardiovascular: Negative for chest pain, palpitations and orthopnea.  Gastrointestinal: Negative for heartburn, nausea and vomiting.  Genitourinary: Negative for dysuria and urgency.  Musculoskeletal: Negative for myalgias.  Skin: Negative for rash.  Neurological: Negative for dizziness, weakness and headaches.  Endo/Heme/Allergies: Does not bruise/bleed easily.  Psychiatric/Behavioral: Negative for depression.  All other systems reviewed and are negative.    VS: BP 120/72 mmHg  Pulse 90  Ht  (1.626 m)  Wt 148 lb 6.4 oz (67.314 kg)  BMI 25.46 kg/m2  SpO2 95%     PHYSICAL EXAM   Physical Exam  Constitutional: She is oriented to person, place, and time. She appears well-developed and well-nourished. No distress.  HENT:  Head: Normocephalic and atraumatic.  Mouth/Throat: No oropharyngeal exudate.  Eyes: EOM are normal. Pupils are equal, round, and reactive to light. No scleral icterus.  Neck: Normal range of motion. Neck supple.  Cardiovascular: Normal rate, regular rhythm and normal heart sounds.   No  murmur heard. Pulmonary/Chest: No stridor. No respiratory distress. She has no wheezes. She has no rales.  Abdominal: Soft. Bowel sounds are normal.  Musculoskeletal: Normal range of motion. She exhibits no edema.  Neurological: She is alert and oriented to person, place, and time.  Skin: Skin is warm. She is not diaphoretic.  Psychiatric: She has a normal mood and affect.          ASSESSMENT/PLAN   70 yo white female with extensive smoking history admitted for  recent COPD exacerbation from acute brochitis  1.patient will need full set of PFT's, 6MWT 2.patient will need ONO 3.Referral to lung cancer screening recommended 4.continue spiriva daily and albuterol as needed   The Patient requires high complexity decision making for assessment and support, frequent evaluation and titration of therapies, application of advanced monitoring technologies and extensive interpretation of multiple databases.   Patient/Family are satisfied with Plan of action and management. All questions answered   Lucie LeatherKurian David Jemar Paulsen, M.D.  [facility]  Medical Director [facility] Medical Director [facility]

## 2015-09-17 ENCOUNTER — Encounter: Payer: Self-pay | Admitting: Internal Medicine

## 2015-09-20 ENCOUNTER — Telehealth: Payer: Self-pay | Admitting: *Deleted

## 2015-09-20 NOTE — Telephone Encounter (Signed)
Patient given appointment for shared decision making visit with lung cancer screening scan immediately after. Tentatively 09/27/15 at 2pm pending insurance approval.

## 2015-09-25 ENCOUNTER — Other Ambulatory Visit: Payer: Self-pay | Admitting: Family Medicine

## 2015-09-25 ENCOUNTER — Encounter: Payer: Self-pay | Admitting: Family Medicine

## 2015-09-25 DIAGNOSIS — Z87891 Personal history of nicotine dependence: Secondary | ICD-10-CM

## 2015-09-26 ENCOUNTER — Encounter: Payer: Self-pay | Admitting: Family Medicine

## 2015-09-27 ENCOUNTER — Ambulatory Visit
Admission: RE | Admit: 2015-09-27 | Discharge: 2015-09-27 | Disposition: A | Discharge status: Home / Self Care | Payer: Medicare Other | Source: Ambulatory Visit | Attending: Family Medicine | Admitting: Family Medicine

## 2015-09-27 ENCOUNTER — Inpatient Hospital Stay: Payer: Medicare Other | Attending: Family Medicine | Admitting: Family Medicine

## 2015-09-27 ENCOUNTER — Encounter: Payer: Self-pay | Admitting: Family Medicine

## 2015-09-27 DIAGNOSIS — Z87891 Personal history of nicotine dependence: Secondary | ICD-10-CM | POA: Insufficient documentation

## 2015-09-27 DIAGNOSIS — Z122 Encounter for screening for malignant neoplasm of respiratory organs: Secondary | ICD-10-CM

## 2015-09-27 NOTE — Progress Notes (Signed)
In accordance with CMS guidelines, patient has meet eligibility criteria including age, absence of signs or symptoms of lung cancer, the specific calculation of cigarette smoking pack-years was 40 years and is a former smoker having quit in 2003.   A shared decision-making session was conducted prior to the performance of CT scan. This includes one or more decision aids, includes benefits and harms of screening, follow-up diagnostic testing, over-diagnosis, false positive rate, and total radiation exposure.  Counseling on the importance of adherence to annual lung cancer LDCT screening, impact of co-morbidities, and ability or willingness to undergo diagnosis and treatment is imperative for compliance of the program.  Counseling on the importance of continued smoking cessation for former smokers; the importance of smoking cessation for current smokers and information about tobacco cessation interventions have been given to patient including the  at [facility], [employer] quit [place], as well as Cancer Center specific smoking cessation programs.  Written order for lung cancer screening with LDCT has been given to the patient and any and all questions have been answered to the best of my abilities.   Yearly follow up will be scheduled by Glenna FellowsShawn Perkins, Thoracic Navigator.

## 2015-09-30 ENCOUNTER — Telehealth: Payer: Self-pay | Admitting: *Deleted

## 2015-09-30 NOTE — Telephone Encounter (Signed)
Notified patient of LDCT lung cancer screening results of Lung Rads 1 finding with recommendation for 12 month follow up imaging. Patient verbalizes understanding.   IMPRESSION: Lung-RADS Category 1, negative. Continue annual screening with low-dose chest CT without contrast in 12 months.

## 2015-10-04 ENCOUNTER — Ambulatory Visit (INDEPENDENT_AMBULATORY_CARE_PROVIDER_SITE_OTHER): Payer: Medicare Other | Admitting: *Deleted

## 2015-10-04 DIAGNOSIS — J449 Chronic obstructive pulmonary disease, unspecified: Secondary | ICD-10-CM

## 2015-10-04 LAB — PULMONARY FUNCTION TEST
DL/VA % PRED: 47 %
DL/VA: 2.36 ml/min/mmHg/L
DLCO unc % pred: 74 %
DLCO unc: 19.12 ml/min/mmHg
FEF 25-75 PRE: 0.47 L/s
FEF 25-75 Post: 0.57 L/sec
FEF2575-%Change-Post: 21 %
FEF2575-%PRED-PRE: 24 %
FEF2575-%Pred-Post: 29 %
FEV1-%CHANGE-POST: 7 %
FEV1-%Pred-Post: 50 %
FEV1-%Pred-Pre: 46 %
FEV1-PRE: 1.1 L
FEV1-Post: 1.18 L
FEV1FVC-%Change-Post: 0 %
FEV1FVC-%PRED-PRE: 68 %
FEV6-%CHANGE-POST: 5 %
FEV6-%PRED-POST: 73 %
FEV6-%Pred-Pre: 69 %
FEV6-Post: 2.15 L
FEV6-Pre: 2.04 L
FEV6FVC-%Change-Post: -1 %
FEV6FVC-%PRED-POST: 100 %
FEV6FVC-%Pred-Pre: 102 %
FVC-%CHANGE-POST: 7 %
FVC-%Pred-Post: 72 %
FVC-%Pred-Pre: 67 %
FVC-Post: 2.25 L
FVC-Pre: 2.1 L
POST FEV1/FVC RATIO: 52 %
POST FEV6/FVC RATIO: 96 %
Pre FEV1/FVC ratio: 52 %
Pre FEV6/FVC Ratio: 97 %

## 2015-10-04 NOTE — Progress Notes (Signed)
PFT performed today with Nitrogen Washout. 

## 2015-10-04 NOTE — Progress Notes (Signed)
SMW performed today. 

## 2015-10-08 ENCOUNTER — Ambulatory Visit (INDEPENDENT_AMBULATORY_CARE_PROVIDER_SITE_OTHER): Payer: Medicare Other | Admitting: Internal Medicine

## 2015-10-08 ENCOUNTER — Encounter: Payer: Self-pay | Admitting: Internal Medicine

## 2015-10-08 VITALS — BP 130/88 | HR 69 | Ht 64.0 in | Wt 151.0 lb

## 2015-10-08 DIAGNOSIS — J438 Other emphysema: Secondary | ICD-10-CM | POA: Diagnosis not present

## 2015-10-08 NOTE — Patient Instructions (Signed)
Chronic Obstructive Pulmonary Disease Chronic obstructive pulmonary disease (COPD) is a common lung condition in which airflow from the lungs is limited. COPD is a general term that can be used to describe many different lung problems that limit airflow, including both chronic bronchitis and emphysema. If you have COPD, your lung function will probably never return to normal, but there are measures you can take to improve lung function and make yourself feel better. CAUSES   Smoking (common).  Exposure to secondhand smoke.  Genetic problems.  Chronic inflammatory lung diseases or recurrent infections. SYMPTOMS  Shortness of breath, especially with physical activity.  Deep, persistent (chronic) cough with a large amount of thick mucus.  Wheezing.  Rapid breaths (tachypnea).  Gray or bluish discoloration (cyanosis) of the skin, especially in your fingers, toes, or lips.  Fatigue.  Weight loss.  Frequent infections or episodes when breathing symptoms become much worse (exacerbations).  Chest tightness. DIAGNOSIS Your health care provider will take a medical history and perform a physical examination to diagnose COPD. Additional tests for COPD may include:  Lung (pulmonary) function tests.  Chest X-ray.  CT scan.  Blood tests. TREATMENT  Treatment for COPD may include:  Inhaler and nebulizer medicines. These help manage the symptoms of COPD and make your breathing more comfortable.  Supplemental oxygen. Supplemental oxygen is only helpful if you have a low oxygen level in your blood.  Exercise and physical activity. These are beneficial for nearly all people with COPD.  Lung surgery or transplant.  Nutrition therapy to gain weight, if you are underweight.  Pulmonary rehabilitation. This may involve working with a team of health care providers and specialists, such as respiratory, occupational, and physical therapists. HOME CARE INSTRUCTIONS  Take all medicines  (inhaled or pills) as directed by your health care provider.  Avoid over-the-counter medicines or cough syrups that dry up your airway (such as antihistamines) and slow down the elimination of secretions unless instructed otherwise by your health care provider.  If you are a smoker, the most important thing that you can do is stop smoking. Continuing to smoke will cause further lung damage and breathing trouble. Ask your health care provider for help with quitting smoking. He or she can direct you to community resources or hospitals that provide support.  Avoid exposure to irritants such as smoke, chemicals, and fumes that aggravate your breathing.  Use oxygen therapy and pulmonary rehabilitation if directed by your health care provider. If you require home oxygen therapy, ask your health care provider whether you should purchase a pulse oximeter to measure your oxygen level at home.  Avoid contact with individuals who have a contagious illness.  Avoid extreme temperature and humidity changes.  Eat healthy foods. Eating smaller, more frequent meals and resting before meals may help you maintain your strength.  Stay active, but balance activity with periods of rest. Exercise and physical activity will help you maintain your ability to do things you want to do.  Preventing infection and hospitalization is very important when you have COPD. Make sure to receive all the vaccines your health care provider recommends, especially the pneumococcal and influenza vaccines. Ask your health care provider whether you need a pneumonia vaccine.  Learn and use relaxation techniques to manage stress.  Learn and use controlled breathing techniques as directed by your health care provider. Controlled breathing techniques include:  Pursed lip breathing. Start by breathing in (inhaling) through your nose for 1 second. Then, purse your lips as if you were   going to whistle and breathe out (exhale) through the  pursed lips for 2 seconds.  Diaphragmatic breathing. Start by putting one hand on your abdomen just above your waist. Inhale slowly through your nose. The hand on your abdomen should move out. Then purse your lips and exhale slowly. You should be able to feel the hand on your abdomen moving in as you exhale.  Learn and use controlled coughing to clear mucus from your lungs. Controlled coughing is a series of short, progressive coughs. The steps of controlled coughing are: 1. Lean your head slightly forward. 2. Breathe in deeply using diaphragmatic breathing. 3. Try to hold your breath for 3 seconds. 4. Keep your mouth slightly open while coughing twice. 5. Spit any mucus out into a tissue. 6. Rest and repeat the steps once or twice as needed. SEEK MEDICAL CARE IF:  You are coughing up more mucus than usual.  There is a change in the color or thickness of your mucus.  Your breathing is more labored than usual.  Your breathing is faster than usual. SEEK IMMEDIATE MEDICAL CARE IF:  You have shortness of breath while you are resting.  You have shortness of breath that prevents you from:  Being able to talk.  Performing your usual physical activities.  You have chest pain lasting longer than 5 minutes.  Your skin color is more cyanotic than usual.  You measure low oxygen saturations for longer than 5 minutes with a pulse oximeter. MAKE SURE YOU:  Understand these instructions.  Will watch your condition.  Will get help right away if you are not doing well or get worse.   This information is not intended to replace advice given to you by your health care provider. Make sure you discuss any questions you have with your health care provider.   Document Released: 02/25/2005 Document Revised: 06/08/2014 Document Reviewed: 01/12/2013 Elsevier Interactive Patient Education 2016 [employer]  

## 2015-10-08 NOTE — Progress Notes (Signed)
[facility] [place] Pulmonary Medicine Consultation     Date: 10/08/2015,   MRN# [id number] Stacey Durham 1946-04-18 Code Status:  Code Status History    Date Active Date Inactive Code Status Order ID Comments User Context   08/16/2015 11:33 AM 08/21/2015  7:28 PM Full Code [id number]  Adrian Saran, MD Inpatient     Hosp day:@LENGTHOFSTAYDAYS @ Referring MD: @     PCP:      AdmissionWeight: 151 lb (68.493 kg)                 CurrentWeight: 151 lb (68.493 kg)     CHIEF COMPLAINT:     Follow up COPD    HISTORY OF PRESENT ILLNESS   Patient doing well Patient has no complaints at this time, no fevers, chills, intermittent coughing no wheezing Uses spiriva daily, patient qiot smoking 1990     Current Medication:   Current outpatient prescriptions:  .  acidophilus (RISAQUAD) CAPS capsule, Take 1 capsule by mouth daily., Disp: , Rfl:  .  albuterol (PROVENTIL) (2.5 MG/3ML) 0.083% nebulizer solution, Take 1 vial by nebulization as needed., Disp: , Rfl:  .  calcium-vitamin D (OSCAL WITH D) 500-200 MG-UNIT tablet, Take 1 tablet by mouth., Disp: , Rfl:  .  feeding supplement, ENSURE ENLIVE, (ENSURE ENLIVE) LIQD, Take 237 mLs by mouth daily., Disp: 237 mL, Rfl: 12 .  montelukast (SINGULAIR) 10 MG tablet, Take 1 tablet by mouth daily., Disp: , Rfl: 3 .  polyethylene glycol (MIRALAX / GLYCOLAX) packet, Take 17 g by mouth daily., Disp: 14 each, Rfl: 0 .  PROAIR HFA 108 (90 Base) MCG/ACT inhaler, Inhale 2 puffs into the lungs as needed., Disp: , Rfl: 1 .  tiotropium (SPIRIVA) 18 MCG inhalation capsule, Place 1 capsule (18 mcg total) into inhaler and inhale daily., Disp: 30 capsule, Rfl: 12     ALLERGIES   Review of patient's allergies indicates no known allergies.     REVIEW OF SYSTEMS   Review of Systems  Constitutional: Negative for fever, chills, weight loss and malaise/fatigue.  HENT: Negative for congestion.   Respiratory: Negative for cough, hemoptysis, sputum  production, shortness of breath and wheezing.   Cardiovascular: Negative for chest pain, palpitations and orthopnea.  All other systems reviewed and are negative.    VS: BP 130/88 mmHg  Pulse 69  Ht  (1.626 m)  Wt 151 lb (68.493 kg)  BMI 25.91 kg/m2  SpO2 96%     PHYSICAL EXAM   Physical Exam  Constitutional: She is oriented to person, place, and time. She appears well-developed and well-nourished.  HENT:  Head: Normocephalic and atraumatic.  Mouth/Throat: No oropharyngeal exudate.  Eyes: EOM are normal. Pupils are equal, round, and reactive to light. No scleral icterus.  Cardiovascular: Normal rate, regular rhythm and normal heart sounds.   No murmur heard. Pulmonary/Chest: No stridor. No respiratory distress. She has no wheezes. She has no rales.  Musculoskeletal: Normal range of motion. She exhibits no edema.  Neurological: She is alert and oriented to person, place, and time.  Skin: Skin is warm.  Psychiatric: She has a normal mood and affect.   CT chest Lung cancer Screening: 08/2015 No nodules/massess PFT 08/2015 Ratio 52% fev1 1.1 46%  fvc 2.1 67%  WNL ONO WNL     ASSESSMENT/PLAN   70 yo white female with Severe COPD based on Pulmonary function with Gold Stage A with h/o extensive smoking history  -continue spiriva daily and albuterol as needed -follow up in  4 months  The Patient requires high complexity decision making for assessment and support, frequent evaluation and titration of therapies, application of advanced monitoring technologies and extensive interpretation of multiple databases.   Patient  are satisfied with Plan of action and management. All questions answered   Lucie LeatherKurian David Catrina Fellenz, M.D.  [facility]  Medical Director [facility] Medical Director [facility]

## 2015-10-11 ENCOUNTER — Emergency Department: Payer: Medicare Other | Admitting: Anesthesiology

## 2015-10-11 ENCOUNTER — Telehealth: Payer: Self-pay | Admitting: Primary Care

## 2015-10-11 ENCOUNTER — Encounter
Admission: EM | Disposition: A | Discharge status: Home / Self Care | Payer: Self-pay | Source: Home / Self Care | Attending: Emergency Medicine

## 2015-10-11 ENCOUNTER — Emergency Department: Payer: Medicare Other

## 2015-10-11 ENCOUNTER — Encounter: Payer: Self-pay | Admitting: Emergency Medicine

## 2015-10-11 ENCOUNTER — Observation Stay
Admission: EM | Admit: 2015-10-11 | Discharge: 2015-10-12 | Disposition: A | Discharge status: Home / Self Care | Payer: Medicare Other | Attending: Surgery | Admitting: Surgery

## 2015-10-11 DIAGNOSIS — Z87891 Personal history of nicotine dependence: Secondary | ICD-10-CM | POA: Insufficient documentation

## 2015-10-11 DIAGNOSIS — Z9889 Other specified postprocedural states: Secondary | ICD-10-CM | POA: Diagnosis not present

## 2015-10-11 DIAGNOSIS — K810 Acute cholecystitis: Secondary | ICD-10-CM

## 2015-10-11 DIAGNOSIS — J449 Chronic obstructive pulmonary disease, unspecified: Secondary | ICD-10-CM | POA: Insufficient documentation

## 2015-10-11 DIAGNOSIS — Z8249 Family history of ischemic heart disease and other diseases of the circulatory system: Secondary | ICD-10-CM | POA: Insufficient documentation

## 2015-10-11 DIAGNOSIS — Z79899 Other long term (current) drug therapy: Secondary | ICD-10-CM | POA: Diagnosis not present

## 2015-10-11 DIAGNOSIS — K8010 Calculus of gallbladder with chronic cholecystitis without obstruction: Secondary | ICD-10-CM | POA: Diagnosis present

## 2015-10-11 DIAGNOSIS — Z791 Long term (current) use of non-steroidal anti-inflammatories (NSAID): Secondary | ICD-10-CM | POA: Insufficient documentation

## 2015-10-11 DIAGNOSIS — K660 Peritoneal adhesions (postprocedural) (postinfection): Secondary | ICD-10-CM | POA: Insufficient documentation

## 2015-10-11 DIAGNOSIS — Z90710 Acquired absence of both cervix and uterus: Secondary | ICD-10-CM | POA: Insufficient documentation

## 2015-10-11 DIAGNOSIS — K8012 Calculus of gallbladder with acute and chronic cholecystitis without obstruction: Principal | ICD-10-CM | POA: Insufficient documentation

## 2015-10-11 HISTORY — PX: CHOLECYSTECTOMY: SHX55

## 2015-10-11 HISTORY — DX: Calculus of gallbladder with chronic cholecystitis without obstruction: K80.10

## 2015-10-11 LAB — COMPREHENSIVE METABOLIC PANEL
ALBUMIN: 4.2 g/dL (ref 3.5–5.0)
ALT: 19 U/L (ref 14–54)
AST: 23 U/L (ref 15–41)
Alkaline Phosphatase: 63 U/L (ref 38–126)
Anion gap: 11 (ref 5–15)
BILIRUBIN TOTAL: 0.9 mg/dL (ref 0.3–1.2)
BUN: 19 mg/dL (ref 6–20)
CHLORIDE: 103 mmol/L (ref 101–111)
CO2: 22 mmol/L (ref 22–32)
Calcium: 9 mg/dL (ref 8.9–10.3)
Creatinine, Ser: 0.84 mg/dL (ref 0.44–1.00)
GFR calc Af Amer: >60 mL/min (ref >60)
GFR calc non Af Amer: >60 mL/min (ref >60)
GLUCOSE: 143 mg/dL — AB (ref 65–99)
POTASSIUM: 3.7 mmol/L (ref 3.5–5.1)
SODIUM: 136 mmol/L (ref 135–145)
TOTAL PROTEIN: 6.9 g/dL (ref 6.5–8.1)

## 2015-10-11 LAB — CBC
HEMATOCRIT: 39.4 % (ref 35.0–47.0)
HEMOGLOBIN: 13.5 g/dL (ref 12.0–16.0)
MCH: 30.6 pg (ref 26.0–34.0)
MCHC: 34.2 g/dL (ref 32.0–36.0)
MCV: 89.7 fL (ref 80.0–100.0)
Platelets: 228 10*3/uL (ref 150–440)
RBC: 4.4 MIL/uL (ref 3.80–5.20)
RDW: 13.6 % (ref 11.5–14.5)
WBC: 12.7 10*3/uL — ABNORMAL HIGH (ref 3.6–11.0)

## 2015-10-11 LAB — TYPE AND SCREEN
ABO/RH(D): A POS
Antibody Screen: NEGATIVE

## 2015-10-11 LAB — URINALYSIS COMPLETE WITH MICROSCOPIC (ARMC ONLY)
Bacteria, UA: NONE SEEN
Bilirubin Urine: NEGATIVE
Glucose, UA: NEGATIVE mg/dL
HGB URINE DIPSTICK: NEGATIVE
Ketones, ur: NEGATIVE mg/dL
LEUKOCYTES UA: NEGATIVE
NITRITE: NEGATIVE
PH: 6 (ref 5.0–8.0)
Protein, ur: NEGATIVE mg/dL
Specific Gravity, Urine: 1.031 — ABNORMAL HIGH (ref 1.005–1.030)

## 2015-10-11 LAB — TROPONIN I: Troponin I: <0.03 ng/mL (ref <0.031)

## 2015-10-11 LAB — LIPASE, BLOOD: LIPASE: 23 U/L (ref 11–51)

## 2015-10-11 LAB — ABO/RH: ABO/RH(D): A POS

## 2015-10-11 SURGERY — LAPAROSCOPIC CHOLECYSTECTOMY
Anesthesia: General | Wound class: Clean Contaminated

## 2015-10-11 MED ORDER — MORPHINE SULFATE (PF) 2 MG/ML IV SOLN: 2.0000 mg | INTRAVENOUS | Status: DC | PRN

## 2015-10-11 MED ORDER — KETOROLAC TROMETHAMINE 15 MG/ML IJ SOLN
15.0000 mg | Freq: Four times a day (QID) | INTRAMUSCULAR | Status: AC
  Administered 2015-10-12: 15 mg via INTRAVENOUS
  Filled 2015-10-11: qty 1

## 2015-10-11 MED ORDER — OXYCODONE HCL 5 MG PO TABS: 5.0000 mg | ORAL_TABLET | Freq: Once | ORAL | Status: DC | PRN

## 2015-10-11 MED ORDER — SODIUM CHLORIDE 0.9 % IV SOLN
8.0000 mg/h | INTRAVENOUS | Status: DC
  Administered 2015-10-11: 8 mg/h via INTRAVENOUS
  Filled 2015-10-11: qty 80

## 2015-10-11 MED ORDER — IOPAMIDOL (ISOVUE-300) INJECTION 61%
100.0000 mL | Freq: Once | INTRAVENOUS | Status: AC | PRN
  Administered 2015-10-11: 100 mL via INTRAVENOUS

## 2015-10-11 MED ORDER — ONDANSETRON 8 MG PO TBDP: 4.0000 mg | ORAL_TABLET | Freq: Four times a day (QID) | ORAL | Status: DC | PRN

## 2015-10-11 MED ORDER — PHENYLEPHRINE HCL 10 MG/ML IJ SOLN
INTRAMUSCULAR | Status: DC | PRN
  Administered 2015-10-11 (×3): 100 ug via INTRAVENOUS
  Administered 2015-10-11: 150 ug via INTRAVENOUS

## 2015-10-11 MED ORDER — MORPHINE SULFATE (PF) 4 MG/ML IV SOLN
4.0000 mg | Freq: Once | INTRAVENOUS | Status: AC
  Administered 2015-10-11: 4 mg via INTRAVENOUS

## 2015-10-11 MED ORDER — ROCURONIUM BROMIDE 100 MG/10ML IV SOLN
INTRAVENOUS | Status: DC | PRN
  Administered 2015-10-11: 25 mg via INTRAVENOUS

## 2015-10-11 MED ORDER — KETOROLAC TROMETHAMINE 15 MG/ML IJ SOLN: 15.0000 mg | Freq: Four times a day (QID) | INTRAMUSCULAR | Status: DC | PRN

## 2015-10-11 MED ORDER — SUCCINYLCHOLINE CHLORIDE 20 MG/ML IJ SOLN
INTRAMUSCULAR | Status: DC | PRN
  Administered 2015-10-11: 100 mg via INTRAVENOUS

## 2015-10-11 MED ORDER — CALCIUM CARBONATE-VITAMIN D 500-200 MG-UNIT PO TABS
1.0000 | ORAL_TABLET | Freq: Two times a day (BID) | ORAL | Status: DC
  Filled 2015-10-11: qty 1

## 2015-10-11 MED ORDER — PROPOFOL 10 MG/ML IV BOLUS
INTRAVENOUS | Status: DC | PRN
  Administered 2015-10-11: 130 mg via INTRAVENOUS
  Administered 2015-10-11: 30 mg via INTRAVENOUS

## 2015-10-11 MED ORDER — ONDANSETRON HCL 4 MG/2ML IJ SOLN
INTRAMUSCULAR | Status: DC | PRN
  Administered 2015-10-11: 4 mg via INTRAVENOUS

## 2015-10-11 MED ORDER — ACETAMINOPHEN 500 MG PO TABS
1000.0000 mg | ORAL_TABLET | Freq: Four times a day (QID) | ORAL | Status: DC
  Administered 2015-10-12 (×2): 1000 mg via ORAL
  Filled 2015-10-11 (×2): qty 2

## 2015-10-11 MED ORDER — OXYCODONE HCL 5 MG PO TABS: 5.0000 mg | ORAL_TABLET | ORAL | Status: DC | PRN

## 2015-10-11 MED ORDER — MORPHINE SULFATE (PF) 4 MG/ML IV SOLN
INTRAVENOUS | Status: AC
  Administered 2015-10-11: 4 mg via INTRAVENOUS
  Filled 2015-10-11: qty 1

## 2015-10-11 MED ORDER — MONTELUKAST SODIUM 10 MG PO TABS
10.0000 mg | ORAL_TABLET | Freq: Every day | ORAL | Status: DC
  Filled 2015-10-11: qty 1

## 2015-10-11 MED ORDER — ALBUTEROL SULFATE (2.5 MG/3ML) 0.083% IN NEBU: 2.5000 mg | INHALATION_SOLUTION | RESPIRATORY_TRACT | Status: DC | PRN

## 2015-10-11 MED ORDER — LACTATED RINGERS IV SOLN
INTRAVENOUS | Status: DC | PRN
  Administered 2015-10-11: 16:00:00 via INTRAVENOUS

## 2015-10-11 MED ORDER — SODIUM CHLORIDE 0.9 % IV BOLUS (SEPSIS)
1000.0000 mL | Freq: Once | INTRAVENOUS | Status: AC
Start: 2015-10-11 — End: 2015-10-11
  Administered 2015-10-11: 1000 mL via INTRAVENOUS

## 2015-10-11 MED ORDER — PANTOPRAZOLE SODIUM 40 MG IV SOLR
40.0000 mg | Freq: Every day | INTRAVENOUS | Status: DC
  Administered 2015-10-11: 40 mg via INTRAVENOUS
  Filled 2015-10-11: qty 40

## 2015-10-11 MED ORDER — LIDOCAINE HCL (CARDIAC) 20 MG/ML IV SOLN
INTRAVENOUS | Status: DC | PRN
  Administered 2015-10-11: 60 mg via INTRAVENOUS

## 2015-10-11 MED ORDER — POLYETHYLENE GLYCOL 3350 17 G PO PACK: 17.0000 g | PACK | Freq: Every day | ORAL | Status: DC

## 2015-10-11 MED ORDER — SUGAMMADEX SODIUM 200 MG/2ML IV SOLN
INTRAVENOUS | Status: DC | PRN
  Administered 2015-10-11: 140 mg via INTRAVENOUS

## 2015-10-11 MED ORDER — BUPIVACAINE-EPINEPHRINE 0.25% -1:200000 IJ SOLN
INTRAMUSCULAR | Status: DC | PRN
Start: 2015-10-11 — End: 2015-10-11
  Administered 2015-10-11: 30 mL

## 2015-10-11 MED ORDER — FENTANYL CITRATE (PF) 100 MCG/2ML IJ SOLN
INTRAMUSCULAR | Status: DC | PRN
  Administered 2015-10-11 (×2): 100 ug via INTRAVENOUS

## 2015-10-11 MED ORDER — DIATRIZOATE MEGLUMINE & SODIUM 66-10 % PO SOLN
15.0000 mL | Freq: Once | ORAL | Status: AC
  Administered 2015-10-11: 15 mL via ORAL

## 2015-10-11 MED ORDER — IPRATROPIUM-ALBUTEROL 0.5-2.5 (3) MG/3ML IN SOLN
RESPIRATORY_TRACT | Status: AC
  Filled 2015-10-11: qty 3

## 2015-10-11 MED ORDER — DEXAMETHASONE SODIUM PHOSPHATE 10 MG/ML IJ SOLN
INTRAMUSCULAR | Status: DC | PRN
  Administered 2015-10-11: 5 mg via INTRAVENOUS

## 2015-10-11 MED ORDER — ONDANSETRON HCL 4 MG/2ML IJ SOLN: 4.0000 mg | Freq: Four times a day (QID) | INTRAMUSCULAR | Status: DC | PRN

## 2015-10-11 MED ORDER — CEFAZOLIN SODIUM 1 G IJ SOLR
INTRAMUSCULAR | Status: DC | PRN
  Administered 2015-10-11: 2 g via INTRAMUSCULAR

## 2015-10-11 MED ORDER — ENOXAPARIN SODIUM 40 MG/0.4ML ~~LOC~~ SOLN: 40.0000 mg | SUBCUTANEOUS | Status: DC

## 2015-10-11 MED ORDER — OXYCODONE HCL 5 MG/5ML PO SOLN
5.0000 mg | Freq: Once | ORAL | Status: DC | PRN
Start: 2015-10-11 — End: 2015-10-11

## 2015-10-11 MED ORDER — SODIUM CHLORIDE 0.9 % IV SOLN
80.0000 mg | Freq: Once | INTRAVENOUS | Status: AC
  Administered 2015-10-11: 80 mg via INTRAVENOUS
  Filled 2015-10-11: qty 80

## 2015-10-11 MED ORDER — LACTATED RINGERS IV SOLN
INTRAVENOUS | Status: DC
  Administered 2015-10-11: 19:00:00 via INTRAVENOUS

## 2015-10-11 MED ORDER — IPRATROPIUM-ALBUTEROL 0.5-2.5 (3) MG/3ML IN SOLN
3.0000 mL | Freq: Once | RESPIRATORY_TRACT | Status: DC | PRN
  Administered 2015-10-11: 3 mL via RESPIRATORY_TRACT

## 2015-10-11 MED ORDER — FENTANYL CITRATE (PF) 100 MCG/2ML IJ SOLN: 25.0000 ug | INTRAMUSCULAR | Status: DC | PRN

## 2015-10-11 MED ORDER — KETOROLAC TROMETHAMINE 15 MG/ML IJ SOLN
INTRAMUSCULAR | Status: DC | PRN
  Administered 2015-10-11: 15 mg via INTRAVENOUS

## 2015-10-11 MED ORDER — ALBUTEROL SULFATE HFA 108 (90 BASE) MCG/ACT IN AERS: 2.0000 | INHALATION_SPRAY | RESPIRATORY_TRACT | Status: DC | PRN

## 2015-10-11 MED ORDER — ONDANSETRON HCL 4 MG/2ML IJ SOLN
4.0000 mg | Freq: Once | INTRAMUSCULAR | Status: AC
  Administered 2015-10-11: 4 mg via INTRAVENOUS
  Filled 2015-10-11: qty 2

## 2015-10-11 MED ORDER — TIOTROPIUM BROMIDE MONOHYDRATE 18 MCG IN CAPS
18.0000 ug | ORAL_CAPSULE | Freq: Every day | RESPIRATORY_TRACT | Status: DC
  Filled 2015-10-11: qty 5

## 2015-10-11 MED ORDER — MORPHINE SULFATE (PF) 4 MG/ML IV SOLN
4.0000 mg | Freq: Once | INTRAVENOUS | Status: AC
  Administered 2015-10-11: 4 mg via INTRAVENOUS
  Filled 2015-10-11: qty 1

## 2015-10-11 MED ORDER — IPRATROPIUM-ALBUTEROL 0.5-2.5 (3) MG/3ML IN SOLN
3.0000 mL | Freq: Once | RESPIRATORY_TRACT | Status: AC
  Administered 2015-10-11: 20:00:00 via RESPIRATORY_TRACT
  Administered 2015-10-11: 3 mL via RESPIRATORY_TRACT

## 2015-10-11 MED ORDER — MIDAZOLAM HCL 5 MG/5ML IJ SOLN
INTRAMUSCULAR | Status: DC | PRN
  Administered 2015-10-11: 2 mg via INTRAVENOUS

## 2015-10-11 SURGICAL SUPPLY — 40 items
APPLIER CLIP 5 13 M/L LIGAMAX5 (MISCELLANEOUS) ×2
BLADE SURG 15 STRL LF DISP TIS (BLADE) ×1 IMPLANT
BLADE SURG 15 STRL SS (BLADE) ×1
CANISTER SUCT 1200ML W/VALVE (MISCELLANEOUS) ×2 IMPLANT
CHLORAPREP W/TINT 26ML (MISCELLANEOUS) ×2 IMPLANT
CHOLANGIOGRAM CATH TAUT (CATHETERS) IMPLANT
CLEANER CAUTERY TIP 5X5 PAD (MISCELLANEOUS) ×1 IMPLANT
CLIP APPLIE 5 13 M/L LIGAMAX5 (MISCELLANEOUS) ×1 IMPLANT
DECANTER SPIKE VIAL GLASS SM (MISCELLANEOUS) IMPLANT
DEVICE TROCAR PUNCTURE CLOSURE (ENDOMECHANICALS) IMPLANT
DRAPE C-ARM XRAY 36X54 (DRAPES) IMPLANT
ELECT REM PT RETURN 9FT ADLT (ELECTROSURGICAL) ×2
ELECTRODE REM PT RTRN 9FT ADLT (ELECTROSURGICAL) ×1 IMPLANT
ENDOPOUCH RETRIEVER 10 (MISCELLANEOUS) ×2 IMPLANT
GLOVE BIO SURGEON STRL SZ7 (GLOVE) ×10 IMPLANT
GOWN STRL REUS W/ TWL LRG LVL3 (GOWN DISPOSABLE) ×3 IMPLANT
GOWN STRL REUS W/TWL LRG LVL3 (GOWN DISPOSABLE) ×3
IRRIGATION STRYKERFLOW (MISCELLANEOUS) ×1 IMPLANT
IRRIGATOR STRYKERFLOW (MISCELLANEOUS) ×2
IV CATH ANGIO 12GX3 LT BLUE (NEEDLE) IMPLANT
IV SOD CHL 0.9% 1000ML (IV SOLUTION) ×2 IMPLANT
L-HOOK LAP DISP 36CM (ELECTROSURGICAL) ×2
LHOOK LAP DISP 36CM (ELECTROSURGICAL) ×1 IMPLANT
LIQUID BAND (GAUZE/BANDAGES/DRESSINGS) ×2 IMPLANT
NEEDLE HYPO 22GX1.5 SAFETY (NEEDLE) ×2 IMPLANT
PACK LAP CHOLECYSTECTOMY (MISCELLANEOUS) ×2 IMPLANT
PAD CLEANER CAUTERY TIP 5X5 (MISCELLANEOUS) ×1
PENCIL ELECTRO HAND CTR (MISCELLANEOUS) ×2 IMPLANT
SCISSORS METZENBAUM CVD 33 (INSTRUMENTS) ×2 IMPLANT
SLEEVE ENDOPATH XCEL 5M (ENDOMECHANICALS) ×4 IMPLANT
STOPCOCK 3 WAY  REPLAC (MISCELLANEOUS) IMPLANT
SUT ETHIBOND 0 MO6 C/R (SUTURE) IMPLANT
SUT MNCRL AB 4-0 PS2 18 (SUTURE) ×2 IMPLANT
SUT VIC AB 0 CT2 27 (SUTURE) IMPLANT
SUT VICRYL 0 AB UR-6 (SUTURE) ×4 IMPLANT
SYR 20CC LL (SYRINGE) IMPLANT
TROCAR XCEL BLUNT TIP 100MML (ENDOMECHANICALS) ×2 IMPLANT
TROCAR XCEL NON-BLD 5MMX100MML (ENDOMECHANICALS) ×2 IMPLANT
TUBING INSUFFLATOR HI FLOW (MISCELLANEOUS) ×2 IMPLANT
WATER STERILE IRR 1000ML POUR (IV SOLUTION) ×2 IMPLANT

## 2015-10-11 NOTE — ED Notes (Signed)
Return from US. AAOx3.  Skin warm and dry. 

## 2015-10-11 NOTE — Anesthesia Postprocedure Evaluation (Signed)
Anesthesia Post Note  Patient: Stacey Durham  Procedure(s) Performed: Procedure(s) (LRB): LAPAROSCOPIC CHOLECYSTECTOMY (N/A)  Patient location during evaluation: PACU Anesthesia Type: General Level of consciousness: awake and alert Pain management: pain level controlled Vital Signs Assessment: post-procedure vital signs reviewed and stable Respiratory status: spontaneous breathing, nonlabored ventilation, respiratory function stable and patient connected to nasal cannula oxygen Cardiovascular status: blood pressure returned to baseline and stable Postop Assessment: no signs of nausea or vomiting Anesthetic complications: no    Last Vitals:  Filed Vitals:   10/11/15 1844 10/11/15 1933  BP: 102/62 114/57  Pulse: 83 81  Temp: 37.1 C 36.6 C  Resp: 14 19    Last Pain:  Filed Vitals:   10/11/15 1933  PainSc: 0-No pain                 Cleda MccreedyJoseph K Piscitello

## 2015-10-11 NOTE — Anesthesia Preprocedure Evaluation (Signed)
Anesthesia Evaluation  Patient identified by MRN, date of birth, ID band Patient awake    Reviewed: Allergy & Precautions, H&P , NPO status , Patient's Chart, lab work & pertinent test results  History of Anesthesia Complications Negative for: history of anesthetic complications  Airway Mallampati: III  TM Distance: >3 FB Neck ROM: full    Dental  (+) Poor Dentition, Upper Dentures, Lower Dentures, Missing   Pulmonary shortness of breath and with exertion, COPD, former smoker,    Pulmonary exam normal breath sounds clear to auscultation       Cardiovascular Exercise Tolerance: Good (-) angina(-) Past MI and (-) DOE negative cardio ROS Normal cardiovascular exam Rhythm:regular Rate:Normal     Neuro/Psych negative neurological ROS  negative psych ROS   GI/Hepatic negative GI ROS, Neg liver ROS, neg GERD  ,  Endo/Other  negative endocrine ROS  Renal/GU negative Renal ROS  negative genitourinary   Musculoskeletal   Abdominal   Peds  Hematology negative hematology ROS (+)   Anesthesia Other Findings Past Medical History:   COPD (chronic obstructive pulmonary disease) (*              Personal history of tobacco use, presenting ha* 09/25/2015   Past Surgical History:   ABDOMINAL HYSTERECTOMY                                        TONSILLECTOMY                                                 APPENDECTOMY                                                 BMI    Body Mass Index   25.73 kg/m 2      Reproductive/Obstetrics negative OB ROS                             Anesthesia Physical Anesthesia Plan  ASA: III  Anesthesia Plan: General ETT and Rapid Sequence   Post-op Pain Management:    Induction:   Airway Management Planned:   Additional Equipment:   Intra-op Plan:   Post-operative Plan:   Informed Consent: I have reviewed the patients History and Physical, chart, labs and  discussed the procedure including the risks, benefits and alternatives for the proposed anesthesia with the patient or authorized representative who has indicated his/her understanding and acceptance.   Dental Advisory Given  Plan Discussed with: Anesthesiologist, CRNA and Surgeon  Anesthesia Plan Comments:         Anesthesia Quick Evaluation

## 2015-10-11 NOTE — Telephone Encounter (Signed)
Noted. Currently at ER.  

## 2015-10-11 NOTE — ED Notes (Signed)
Pt arrived via EMS from home. Pt states sxs started last night after eating corn on the cob.  Pt states she has had numerous loose stools that are black and tarry along with vomiting that is dark and chunky.  Pt also states she has left flank pain that has moved to her abdomen and epigastric region. Pt states she feels like the corn is stuck in the middle of her chest.  Pt states she has had numerous bowel movements and vomiting throughout the night.  Pt given 4mg  IV Zofran with EMS.

## 2015-10-11 NOTE — Telephone Encounter (Signed)
Patient Name: Stacey Durham  DOB: 1946/03/04    Initial Comment caller states friend has back pain, vomiting and abd pain; has urinated   Nurse Assessment  Nurse: Stacey CooterHenry, RN, Stacey Durham Date/Time Stacey Durham(Eastern Time): 10/11/2015 8:34:36 AM  Confirm and document reason for call. If symptomatic, describe symptoms. You must click the next button to save text entered. ---Patient states that she has chest pressure which began last night. She rates the pain as 8 on 0-10 scale. Denies difficulty breathing.  Has the patient traveled out of the country within the last 30 days? ---No  Does the patient have any new or worsening symptoms? ---Yes  Will a triage be completed? ---Yes  Related visit to physician within the last 2 weeks? ---No  Does the PT have any chronic conditions? (i.e. diabetes, asthma, etc.) ---Yes  List chronic conditions. ---COPD  Is this a behavioral health or substance abuse call? ---No     Guidelines    Guideline Title Affirmed Question Affirmed Notes  Chest Pain [1] Chest pain lasts > 5 minutes AND [2] described as crushing, pressure-like, or heavy    Final Disposition User   Call EMS 911 Now Stacey CooterHenry, RN, Stacey Durham    Disagree/Comply: Danella Maiersomply

## 2015-10-11 NOTE — Transfer of Care (Signed)
Immediate Anesthesia Transfer of Care Note  Patient: Stacey EvensJoanne Klang  Procedure(s) Performed: Procedure(s): LAPAROSCOPIC CHOLECYSTECTOMY (N/A)  Patient Location: PACU  Anesthesia Type:General  Level of Consciousness: sedated  Airway & Oxygen Therapy: Patient Spontanous Breathing and Patient connected to face mask oxygen  Post-op Assessment: Report given to RN  Post vital signs: Reviewed and stable  Last Vitals:  Filed Vitals:   10/11/15 1536 10/11/15 1759  BP: 115/72 91/51  Pulse: 63 77  Temp:  37.4 C  Resp: 18 12    Last Pain:  Filed Vitals:   10/11/15 1800  PainSc: 2          Complications: No apparent anesthesia complications

## 2015-10-11 NOTE — Anesthesia Procedure Notes (Addendum)
Procedure Name: Intubation Date/Time: 10/11/2015 4:41 PM Performed by: Lily KocherPERALTA, Keyonni Percival Pre-anesthesia Checklist: Patient identified, Patient being monitored, Timeout performed, Emergency Drugs available and Suction available Patient Re-evaluated:Patient Re-evaluated prior to inductionOxygen Delivery Method: Circle system utilized Preoxygenation: Pre-oxygenation with 100% oxygen Intubation Type: IV induction and Rapid sequence Laryngoscope Size: Mac and 3 Grade View: Grade I Tube type: Oral Tube size: 7.0 mm Number of attempts: 1 Airway Equipment and Method: Stylet Placement Confirmation: ETT inserted through vocal cords under direct vision,  positive ETCO2 and breath sounds checked- equal and bilateral Secured at: 21 cm Tube secured with: Tape Dental Injury: Teeth and Oropharynx as per pre-operative assessment

## 2015-10-11 NOTE — ED Notes (Signed)
Patient's clothing and upper and lower dentures placed in patient belonging bag and dentures in a denture cup, and given to patient's friend.  Patient also took off all jewelry and gave to her friend.

## 2015-10-11 NOTE — H&P (Signed)
Patient ID: Stacey Durham, female   DOB: [birth year], 70 y.o.   MRN: [id number]  History of Present Illness Stacey Durham is a 70 y.o. female with abdominal pain N/V. Started last night , sharp severe pain epigastric area radiated to back, N/V. No jaundice. Some chills. Hx COPD well  Controlled. Abd hysterectomy and appy in the past.  Past Medical History Past Medical History  Diagnosis Date  . COPD (chronic obstructive pulmonary disease) (HCC)   . Personal history of tobacco use, presenting hazards to health 09/25/2015      Past Surgical History  Procedure Laterality Date  . Abdominal hysterectomy    . Tonsillectomy    . Appendectomy      No Known Allergies  Current Facility-Administered Medications  Medication Dose Route Frequency Provider Last Rate Last Dose  . pantoprazole (PROTONIX) 80 mg in sodium chloride 0.9 % 250 mL (0.32 mg/mL) infusion  8 mg/hr Intravenous Continuous Minna Antis, MD 25 mL/hr at 10/11/15 1100 8 mg/hr at 10/11/15 1100   Current Outpatient Prescriptions  Medication Sig Dispense Refill  . acidophilus (RISAQUAD) CAPS capsule Take 1 capsule by mouth daily.    . calcium-vitamin D (OSCAL WITH D) 500-200 MG-UNIT tablet Take 1 tablet by mouth.    . feeding supplement, ENSURE ENLIVE, (ENSURE ENLIVE) LIQD Take 237 mLs by mouth daily. 237 mL 12  . ibuprofen (ADVIL,MOTRIN) 200 MG tablet Take 600 mg by mouth every 6 (six) hours as needed for mild pain or moderate pain.    . montelukast (SINGULAIR) 10 MG tablet Take 1 tablet by mouth daily.  3  . polyethylene glycol (MIRALAX / GLYCOLAX) packet Take 17 g by mouth daily. 14 each 0  . tiotropium (SPIRIVA) 18 MCG inhalation capsule Place 1 capsule (18 mcg total) into inhaler and inhale daily. 30 capsule 12  . albuterol (PROVENTIL) (2.5 MG/3ML) 0.083% nebulizer solution Take 1 vial by nebulization as needed. Reported on 10/11/2015    . PROAIR HFA 108 (90 Base) MCG/ACT inhaler Inhale 2 puffs into the lungs as  needed.  1    Family History Family History  Problem Relation Age of Onset  . Heart attack Father     Social History Social History  Substance Use Topics  . Smoking status: Former Smoker -- 1.00 packs/day for 40 years    Quit date: 06/01/2001  . Smokeless tobacco: None  . Alcohol Use: No     ROS 10 pts ROS performed and is otherwise neg  Physical Exam Blood pressure 115/72, pulse 63, temperature 97.9 F (36.6 C), temperature source Oral, resp. rate 18, height  (1.626 m), weight 68.04 kg (150 lb), SpO2 95 %.  CONSTITUTIONAL:NAD,  Non toxic EYES: Pupils equal, round, and reactive to light, Sclera non-icteric. EARS, NOSE, MOUTH AND THROAT: The oropharynx is clear. Oral mucosa is pink and moist. Hearing is intact to voice.  NECK: Trachea is midline, and there is no jugular venous distension. Thyroid is without palpable abnormalities. LYMPH NODES:  Lymph nodes in the neck are not enlarged. RESPIRATORY:  Lungs are clear, and breath sounds are equal bilaterally. Normal respiratory effort without pathologic use of accessory muscles. CARDIOVASCULAR: Heart is regular without murmurs, gallops, or rubs. GI: The abdomen is  soft, tender RUQ, + murphy. nondistended. There were no palpable masses. There was no hepatosplenomegaly. There were normal bowel sounds. GU: DEFERRED MUSCULOSKELETAL:  Normal muscle strength and tone in all four extremities.    SKIN: Skin turgor is normal. There are no pathologic skin lesions.  NEUROLOGIC:  Motor and sensation is grossly normal.  Cranial nerves are grossly intact. PSYCH:  Alert and oriented to person, place and time. Affect is normal.  Data Reviewed  I have personally reviewed the patient's imaging and medical records.    Assessment Plan Acute cholecystitis in need for cholecystectomy. The risks, benefits, complications, treatment options, and expected outcomes were discussed with the patient. The possibilities of bleeding, recurrent  infection, finding a normal gallbladder, perforation of viscus organs, damage to surrounding structures, bile leak, abscess formation, needing a drain placed, the need for additional procedures, reaction to medication, pulmonary aspiration,  failure to diagnose a condition, the possible need to convert to an open procedure, and creating a complication requiring transfusion or operation were discussed with the patient. The patient and/or family concurred with the proposed plan, giving informed consent.  Start A/B and fluids.  Janisse Ghan, MD FACS  Shakim Faith F Annjanette Wertenberger 10/11/2015, 3:49 PM

## 2015-10-11 NOTE — Op Note (Signed)
Laparoscopic Cholecystectomy  Pre-operative Diagnosis: Acute cholecystitis  Post-operative Diagnosis: Same  Procedure: Laparoscopic lysis of adhesions Laparoscopic cholecystectomy  Surgeon: Sterling Bigiego Joell Usman, MD FACS  Anesthesia: Gen. with endotracheal tube    Findings: Acute Cholecystitis with hydrops Dense/ thick adhesions from Gb to duodenum and from Omentum to the GB   Estimated Blood Loss: 100cc         Drains: none         Specimens: Gallbladder           Complications: none   Procedure Details  The patient was seen again in the Holding Room. The benefits, complications, treatment options, and expected outcomes were discussed with the patient. The risks of bleeding, infection, recurrence of symptoms, failure to resolve symptoms, bile duct damage, bile duct leak, retained common bile duct stone, bowel injury, any of which could require further surgery and/or ERCP, stent, or papillotomy were reviewed with the patient. The likelihood of improving the patient's symptoms with return to their baseline status is good.  The patient and/or family concurred with the proposed plan, giving informed consent.  The patient was taken to Operating Room, identified as Stacey Durham and the procedure verified as Laparoscopic Cholecystectomy.  A Time Out was held and the above information confirmed.  Prior to the induction of general anesthesia, antibiotic prophylaxis was administered. VTE prophylaxis was in place. General endotracheal anesthesia was then administered and tolerated well. After the induction, the abdomen was prepped with Chloraprep and draped in the sterile fashion. The patient was positioned in the supine position.  Local anesthetic  was injected into the skin near the umbilicus and an incision made. Cut down technique was used to enter the abdominal cavity and a Hasson trochar was placed after two vicryl stitches were anchored to the fascia. Pneumoperitoneum was then created  with CO2 and tolerated well without any adverse changes in the patient's vital signs.  Three 5-mm ports were placed in the right upper quadrant all under direct vision. All skin incisions  were infiltrated with a local anesthetic agent before making the incision and placing the trocars.   The patient was positioned  in reverse Trendelenburg, tilted slightly to the patient's left. Dense adhesions encountered and these were taken down with electrocautery. The gallbladder was identified, the fundus grasped and retracted cephalad. Adhesions were lysed bluntly. The infundibulum was grasped and retracted laterally, exposing the peritoneum overlying the triangle of Calot. This was then divided and exposed in a blunt fashion. An extended critical view of the cystic duct and cystic artery was obtained.  The cystic duct was clearly identified and bluntly dissected.   Artery and duct were double clipped and divided.  The gallbladder was taken from the gallbladder fossa in a retrograde fashion with the electrocautery. The gallbladder was removed and placed in an Endocatch bag. The liver bed was irrigated and inspected. Hemostasis was achieved with the electrocautery. Copious irrigation was utilized and was repeatedly aspirated until clear.  The gallbladder and Endocatch sac were then removed through the epigastric port site.   Inspection of the right upper quadrant was performed. No bleeding, bile duct injury or leak, or bowel injury was noted. Pneumoperitoneum was released.  The periumbilical port site was closed with figure-of-eight 0 Vicryl sutures. 4-0 subcuticular Monocryl was used to close the skin. Dermabond was  applied.  The patient was then extubated and brought to the recovery room in stable condition. Sponge, lap, and needle counts were correct at closure and at the conclusion of  the case.               Sterling Big, MD, FACS

## 2015-10-11 NOTE — ED Provider Notes (Signed)
[facility] Emergency Department Provider Note  Time seen: 10:11 AM  I have reviewed the triage vital signs and the nursing notes.   HISTORY  Chief Complaint Abdominal Pain; Emesis; and GI Bleeding    HPI Stacey Durham is a 70 y.o. female with a past medical history of COPD who presents the emergency department with upper abdominal pain, nausea and vomiting since last night. Patient states several dark stools, her vomiting was black, occasionally with bright red blood. Patient denies any history of vomiting blood in the past. Describes epigastric abdominal pain which she states is moderate to severe, burning sensation. Denies heavy NSAID use, denies alcohol use, denies history of GI bleed in the past.Patient denies use of any blood thinners including aspirin.     Past Medical History  Diagnosis Date  . COPD (chronic obstructive pulmonary disease) (HCC)   . Personal history of tobacco use, presenting hazards to health 09/25/2015    Patient Active Problem List   Diagnosis Date Noted  . Personal history of tobacco use, presenting hazards to health 09/25/2015  . COPD (chronic obstructive pulmonary disease) (HCC) 08/16/2015    Past Surgical History  Procedure Laterality Date  . Abdominal hysterectomy    . Tonsillectomy    . Appendectomy      Current Outpatient Rx  Name  Route  Sig  Dispense  Refill  . acidophilus (RISAQUAD) CAPS capsule   Oral   Take 1 capsule by mouth daily.         Marland Kitchen albuterol (PROVENTIL) (2.5 MG/3ML) 0.083% nebulizer solution   Nebulization   Take 1 vial by nebulization as needed.         . calcium-vitamin D (OSCAL WITH D) 500-200 MG-UNIT tablet   Oral   Take 1 tablet by mouth.         . feeding supplement, ENSURE ENLIVE, (ENSURE ENLIVE) LIQD   Oral   Take 237 mLs by mouth daily.   237 mL   12   . montelukast (SINGULAIR) 10 MG tablet   Oral   Take 1 tablet by mouth daily.      3   . polyethylene glycol  (MIRALAX / GLYCOLAX) packet   Oral   Take 17 g by mouth daily.   14 each   0   . PROAIR HFA 108 (90 Base) MCG/ACT inhaler   Inhalation   Inhale 2 puffs into the lungs as needed.      1     Dispense as written.   . tiotropium (SPIRIVA) 18 MCG inhalation capsule   Inhalation   Place 1 capsule (18 mcg total) into inhaler and inhale daily.   30 capsule   12     Allergies Review of patient's allergies indicates no known allergies.  Family History  Problem Relation Age of Onset  . Heart attack Father     Social History Social History  Substance Use Topics  . Smoking status: Former Smoker -- 1.00 packs/day for 40 years    Quit date: 06/01/2001  . Smokeless tobacco: None  . Alcohol Use: No    Review of Systems Constitutional: Negative for fever. Cardiovascular: Negative for chest pain. Respiratory: Negative for shortness of breath. Gastrointestinal: Positive for epigastric pain, nausea, vomiting with bloody/black vomit, dark stool but denies diarrhea. Genitourinary: Negative for dysuria Neurological: Negative for headache 10-point ROS otherwise negative.  ____________________________________________   PHYSICAL EXAM:  VITAL SIGNS: ED Triage Vitals  Enc Vitals Group     BP 10/11/15  0951 152/78 mmHg     Pulse Rate 10/11/15 0951 63     Resp 10/11/15 0951 14     Temp 10/11/15 0951 97.9 F (36.6 C)     Temp Source 10/11/15 0951 Oral     SpO2 10/11/15 0951 94 %     Weight 10/11/15 0951 150 lb (68.04 kg)     Height 10/11/15 0951 5\' 4"  (1.626 m)     Head Cir --      Peak Flow --      Pain Score 10/11/15 0952 8     Pain Loc --      Pain Edu? --      Excl. in GC? --     Constitutional: Alert and oriented. Well appearing and in no distress. Eyes: Normal exam ENT   Head: Normocephalic and atraumatic   Mouth/Throat: Mucous membranes are moist. Cardiovascular: Normal rate, regular rhythm. No murmur Respiratory: Normal respiratory effort without tachypnea  nor retractions. Breath sounds are clear  Gastrointestinal: Soft, moderate mid to upper abdominal tenderness palpation without rebound or guarding. No distention. Musculoskeletal: Nontender with normal range of motion in all extremities.  Neurologic:  Normal speech and language. No gross focal neurologic deficits  Skin:  Skin is warm, dry and intact.  Psychiatric: Mood and affect are normal. Speech and behavior are normal.  ____________________________________________    EKG  EKG reviewed and interpreted by myself shows normal sinus rhythm at 63 bpm, narrow QRS, normal axis, normal intervals, nonspecific but no concerning ST changes. Overall reassuring EKG.  ____________________________________________    RADIOLOGY  CT the abdomen/pelvis shows gallbladder wall thickening with distention concerning for cholecystitis.  Ultrasound the right upper quadrant shows gallbladder wall thickening concerning for cholecystitis with a nonmobile gallstone. ____________________________________________   INITIAL IMPRESSION / ASSESSMENT AND PLAN / ED COURSE  Pertinent labs & imaging results that were available during my care of the patient were reviewed by me and considered in my medical decision making (see chart for details).  Patient presents to the emergency department with upper abdominal pain, nausea, vomiting which she states was dark and bloody. Patient does have moderate epigastric tenderness to palpation. We will check labs, obtain a CT scan of the abdomen/pelvis. We will start the patient on a Protonix drip given likelihood of upper GI bleeding. Patient's rectal exam is normal, guaiac negative stool.  CT and ultrasound consistent with likely cholecystitis. White blood cell count of 12. I discussed the patient with Dr. Everlene FarrierPabon of Gen. surgery who will be down to see the patient and admit her.  ____________________________________________   FINAL CLINICAL IMPRESSION(S) / ED  DIAGNOSES  Epigastric pain Nausea and vomiting  acute cholecystitis  Minna AntisKevin Arvle Grabe, MD 10/11/15 1429

## 2015-10-11 NOTE — ED Notes (Signed)
To US via stretcher.

## 2015-10-12 MED ORDER — OXYCODONE-ACETAMINOPHEN 7.5-325 MG PO TABS: 1.0000 | ORAL_TABLET | ORAL | Status: DC | PRN

## 2015-10-12 NOTE — Discharge Instructions (Signed)
Cholecystostomy, Care After °Refer to this sheet in the next few weeks. These instructions provide you with information about caring for yourself after your procedure. Your health care provider may also give you more specific instructions. Your treatment has been planned according to current medical practices, but problems sometimes occur. Call your health care provider if you have any problems or questions after your procedure. °WHAT TO EXPECT AFTER THE PROCEDURE °After your procedure, it is common to have soreness near the site of your drainage tube (catheter) or your incision. °HOME CARE INSTRUCTIONS °Incision Care °· Follow instructions from your health care provider about how to take care of your incision. Make sure you: °¨ Wash your hands with soap and water before you change your bandage (dressing). If soap and water are not available, use hand sanitizer. °¨ Change your dressing as told by your health care provider. °¨ Leave stitches (sutures), skin glue, or adhesive strips in place. These skin closures may need to be in place for 2 weeks or longer. If adhesive strip edges start to loosen and curl up, you may trim the loose edges. Do not remove adhesive strips completely unless your health care provider tells you to do that. °· Check your incision and your drainage site every day for signs of infection. Watch for: °¨ Redness, swelling, or pain. °¨ Fluid, blood, or pus. ° General Instructions °· If you were sent home with a surgical drain in place, follow instructions from your health care provider about how to care for your drain and collection bag at home. °· Do not take baths, swim, or use a hot tub until your health care provider approves. Ask your health care provider if you can take showers. You may only be allowed to take sponge baths for bathing. °· Follow instructions from your health care provider about what you may eat or drink. °· Take over-the-counter and prescription medicines only as told by  your health care provider. °· Keep all follow-up visits as told by your health care provider. This is important. °SEEK MEDICAL CARE IF: °· You have redness, swelling, or pain at your incision or drainage site. °· You have nausea or vomiting. °SEEK IMMEDIATE MEDICAL CARE IF: °· Your abdominal pain gets worse. °· You feel dizzy or you faint while standing. °· You have fluid, blood, or pus coming from your incision or drainage site. °· You have a fever. °· You have shortness of breath. °· You have a rapid heartbeat. °· Your nausea or vomiting does not go away. °· Your drainage tube becomes blocked. °· Your drainage tube comes out of your abdomen. °  °This information is not intended to replace advice given to you by your health care provider. Make sure you discuss any questions you have with your health care provider. °  °Document Released: 02/06/2015 Document Reviewed: 08/29/2014 °Elsevier Interactive Patient Education ©2016 [employer] Inc[employer] ° °

## 2015-10-12 NOTE — Discharge Summary (Signed)
Patient ID: Treniyah Lynn MRN: [id number] DOB/AGE: 1945/08/13 70 y.o.  Admit date: 10/11/2015 Discharge date: 10/12/2015   Discharge Diagnoses:  Active Problems:   Cholecystitis with cholelithiasis   Procedures: Laparoscopic cholecystectomy  Hospital Course: 70 year old female with classic signs of acute cholecystitis confirmed by ultrasound. She underwent a laparoscopic cholecystectomy and did very well postoperatively. Admitted overnight at the time of discharge she was ambulating, she was tolerating regular diet her pain was well-controlled her vital signs were stable. Her exam show the patient in no acute distress awake alert, abdomen was soft incisions were healing well without evidence of infection. Condition of the patient at time of discharge is stable    Disposition: 01-Home or Self Care  Discharge Instructions    Call MD for:  difficulty breathing, headache or visual disturbances    Complete by:  As directed      Call MD for:  extreme fatigue    Complete by:  As directed      Call MD for:  hives    Complete by:  As directed      Call MD for:  persistant dizziness or light-headedness    Complete by:  As directed      Call MD for:  persistant nausea and vomiting    Complete by:  As directed      Call MD for:  redness, tenderness, or signs of infection (pain, swelling, redness, odor or green/yellow discharge around incision site)    Complete by:  As directed      Call MD for:  severe uncontrolled pain    Complete by:  As directed      Call MD for:  temperature >100.4    Complete by:  As directed      Diet - low sodium heart healthy    Complete by:  As directed      Discharge instructions    Complete by:  As directed   May Shower Sunday am     Increase activity slowly    Complete by:  As directed      Lifting restrictions    Complete by:  As directed   20 lbs     No dressing needed    Complete by:  As directed             Medication List    TAKE these  medications        acidophilus Caps capsule  Take 1 capsule by mouth daily.     calcium-vitamin D 500-200 MG-UNIT tablet  Commonly known as:  OSCAL WITH D  Take 1 tablet by mouth.     feeding supplement (ENSURE ENLIVE) Liqd  Take 237 mLs by mouth daily.     ibuprofen 200 MG tablet  Commonly known as:  ADVIL,MOTRIN  Take 600 mg by mouth every 6 (six) hours as needed for mild pain or moderate pain.     montelukast 10 MG tablet  Commonly known as:  SINGULAIR  Take 1 tablet by mouth daily.     oxyCODONE-acetaminophen 7.5-325 MG tablet  Commonly known as:  PERCOCET  Take 1 tablet by mouth every 4 (four) hours as needed for severe pain.     polyethylene glycol packet  Commonly known as:  MIRALAX / GLYCOLAX  Take 17 g by mouth daily.     PROAIR HFA 108 (90 Base) MCG/ACT inhaler  Generic drug:  albuterol  Inhale 2 puffs into the lungs as needed.     albuterol (2.5 MG/3ML) 0.083%  nebulizer solution  Commonly known as:  PROVENTIL  Take 1 vial by nebulization as needed. Reported on 10/11/2015     tiotropium 18 MCG inhalation capsule  Commonly known as:  SPIRIVA  Place 1 capsule (18 mcg total) into inhaler and inhale daily.           Follow-up Information    Follow up with [facility] In 2 weeks.   Contact information:   [address] [place] [id number] [phone]       Sterling Bigiego Silver Achey, MD FACS

## 2015-10-12 NOTE — Care Management Obs Status (Signed)
MEDICARE OBSERVATION STATUS NOTIFICATION   Patient Details  Name: Stacey SalmonJoanne Jayne MRN: [id number] Date of Birth: Apr 04, 1946   Medicare Observation Status Notification Given:  No (observation less than 24 hours)    Caren MacadamMichelle Kyler Lerette, RN 10/12/2015, 10:27 AM

## 2015-10-14 ENCOUNTER — Encounter: Payer: Self-pay | Admitting: Surgery

## 2015-10-15 LAB — SURGICAL PATHOLOGY

## 2015-10-17 ENCOUNTER — Telehealth: Payer: Self-pay | Admitting: Surgery

## 2015-10-17 NOTE — Telephone Encounter (Signed)
Called patient back and had to leave her a voicemail. However, I did leave a message for patient on her answering machine in reference to her constipation. I recommended for her to take stool softeners OTC 1 tab daily and Miralax 17 G  OTC daily to start her bowels to move. I also told her that if this wouldn't help her to please call us back. In addition, she was informed to go to the emergency room if she noticed any redness on her incision, fever, and nausea/vomiting.

## 2015-10-17 NOTE — Telephone Encounter (Signed)
Patient has called with concerns of being possibly constipated. She recently had a laparoscopic cholecystectomy on 10/11/15 with Dr Everlene FarrierPabon. She states that she had not had a bowel movement from the time of her surgery till yesterday. Patient states, "my bowel is orange and yellow, very sticky and runny". She is concerned that this is a sign of infection. She has had no fever, pain or nausea after her procedure. Patient would like a call back to discuss her symptoms. Phone number in chart verified.   Patient is scheduled for a Post Op appointment on 10/29/15 in [place] with Dr Everlene FarrierPabon.

## 2015-10-25 ENCOUNTER — Other Ambulatory Visit: Payer: Self-pay

## 2015-10-29 ENCOUNTER — Ambulatory Visit (INDEPENDENT_AMBULATORY_CARE_PROVIDER_SITE_OTHER): Payer: Medicare Other | Admitting: Surgery

## 2015-10-29 ENCOUNTER — Encounter: Payer: Self-pay | Admitting: Surgery

## 2015-10-29 VITALS — BP 158/86 | HR 74 | Temp 97.5°F | Ht 64.8 in | Wt 148.0 lb

## 2015-10-29 DIAGNOSIS — Z09 Encounter for follow-up examination after completed treatment for conditions other than malignant neoplasm: Secondary | ICD-10-CM

## 2015-10-29 NOTE — Progress Notes (Signed)
S/p lap chole, doing very well, no complaints Taking PO  PE:  NAD alert,  Abd: soft, NT, incisions c/d/i, no infection  A/P Doing well, path d/w pt in detail No heavy lifting RTC prn

## 2015-10-29 NOTE — Patient Instructions (Signed)
Please call our office if you have any questions or concerns.  

## 2015-11-21 ENCOUNTER — Telehealth: Payer: Self-pay | Admitting: Internal Medicine

## 2015-11-21 NOTE — Telephone Encounter (Signed)
Pt calling stating he needs to be seen She went camping and started to get sick and now has a bad cough and is real SOB  Pt see's Dr Belia HemanKasa and the next available appointment is in July  Pt states she needs to be seen sooner that that/ Please advise.

## 2015-11-21 NOTE — Telephone Encounter (Signed)
Will you call pt and put her in with next available provider as an acute visit? Thanks

## 2015-11-22 ENCOUNTER — Encounter: Payer: Self-pay | Admitting: Surgery

## 2015-11-22 ENCOUNTER — Telehealth: Payer: Self-pay | Admitting: Internal Medicine

## 2015-11-22 ENCOUNTER — Ambulatory Visit
Admission: RE | Admit: 2015-11-22 | Discharge: 2015-11-22 | Disposition: A | Discharge status: Home / Self Care | Payer: Medicare Other | Source: Ambulatory Visit | Attending: Surgery | Admitting: Surgery

## 2015-11-22 ENCOUNTER — Ambulatory Visit (INDEPENDENT_AMBULATORY_CARE_PROVIDER_SITE_OTHER): Payer: Medicare Other | Admitting: Surgery

## 2015-11-22 ENCOUNTER — Other Ambulatory Visit
Admission: RE | Admit: 2015-11-22 | Discharge: 2015-11-22 | Disposition: A | Discharge status: Home / Self Care | Payer: Medicare Other | Source: Ambulatory Visit | Attending: Surgery | Admitting: Surgery

## 2015-11-22 ENCOUNTER — Telehealth: Payer: Self-pay

## 2015-11-22 VITALS — BP 132/85 | HR 80 | Temp 97.8°F | Ht 65.0 in | Wt 144.0 lb

## 2015-11-22 DIAGNOSIS — R0689 Other abnormalities of breathing: Secondary | ICD-10-CM | POA: Diagnosis not present

## 2015-11-22 DIAGNOSIS — R918 Other nonspecific abnormal finding of lung field: Secondary | ICD-10-CM | POA: Diagnosis not present

## 2015-11-22 LAB — COMPREHENSIVE METABOLIC PANEL
ALT: 14 U/L (ref 14–54)
ANION GAP: 10 (ref 5–15)
AST: 16 U/L (ref 15–41)
Albumin: 3.3 g/dL — ABNORMAL LOW (ref 3.5–5.0)
Alkaline Phosphatase: 94 U/L (ref 38–126)
BUN: 13 mg/dL (ref 6–20)
CO2: 26 mmol/L (ref 22–32)
Calcium: 9 mg/dL (ref 8.9–10.3)
Chloride: 103 mmol/L (ref 101–111)
Creatinine, Ser: 0.94 mg/dL (ref 0.44–1.00)
GFR calc non Af Amer: >60 mL/min (ref >60)
GLUCOSE: 108 mg/dL — AB (ref 65–99)
POTASSIUM: 3.5 mmol/L (ref 3.5–5.1)
Sodium: 139 mmol/L (ref 135–145)
TOTAL PROTEIN: 6.9 g/dL (ref 6.5–8.1)
Total Bilirubin: 0.4 mg/dL (ref 0.3–1.2)

## 2015-11-22 LAB — CBC WITH DIFFERENTIAL/PLATELET
Basophils Absolute: 0.1 10*3/uL (ref 0–0.1)
Eosinophils Absolute: 0.3 10*3/uL (ref 0–0.7)
Eosinophils Relative: 3 %
HEMATOCRIT: 34.4 % — AB (ref 35.0–47.0)
Hemoglobin: 11.8 g/dL — ABNORMAL LOW (ref 12.0–16.0)
LYMPHS ABS: 1.4 10*3/uL (ref 1.0–3.6)
Lymphocytes Relative: 13 %
MCH: 30.3 pg (ref 26.0–34.0)
MCHC: 34.2 g/dL (ref 32.0–36.0)
MCV: 88.6 fL (ref 80.0–100.0)
MONO ABS: 0.9 10*3/uL (ref 0.2–0.9)
NEUTROS ABS: 8 10*3/uL — AB (ref 1.4–6.5)
Platelets: 343 10*3/uL (ref 150–440)
RBC: 3.88 MIL/uL (ref 3.80–5.20)
RDW: 13.2 % (ref 11.5–14.5)
WBC: 10.7 10*3/uL (ref 3.6–11.0)

## 2015-11-22 MED ORDER — CIPROFLOXACIN HCL 500 MG PO TABS: 500.0000 mg | ORAL_TABLET | Freq: Two times a day (BID) | ORAL | Status: DC

## 2015-11-22 NOTE — Telephone Encounter (Signed)
Called patient and she is coming 11/26/15 to see Dr Dema SeverinMungal

## 2015-11-22 NOTE — Telephone Encounter (Signed)
[facility]urse [facility] calling, states pt was in their office this morning, Dr. Everlene FarrierPabon ordered a chest xray and pt has pneumonia. Pt was started on antibiotics. Would like pulmonary to review chest xray.

## 2015-11-22 NOTE — Progress Notes (Signed)
Outpatient Surgical Follow Up  11/22/2015  Stacey Durham is an 70 y.o. female.   Chief Complaint  Patient presents with  . Routine Post Op    Laparoscopic Cholecystectomy (10/11/15)- Dr. Everlene FarrierPabon    HPI: s/p lap chole over a month ago. She feels weak and has dyspnea on exertion. Worsening cough. HX COPD. No abdominal pain, taking PO. NO fevers or chillls. She had changed PCP providers and it has been difficult to treat her COPD.  Past Medical History  Diagnosis Date  . COPD (chronic obstructive pulmonary disease) (HCC)   . Personal history of tobacco use, presenting hazards to health 09/25/2015    Past Surgical History  Procedure Laterality Date  . Abdominal hysterectomy    . Tonsillectomy    . Appendectomy    . Cholecystectomy N/A 10/11/2015    Procedure: LAPAROSCOPIC CHOLECYSTECTOMY;  Surgeon: Leafy Roiego F Fradel Baldonado, MD;  Location: [facility] ORS;  Service: General;  Laterality: N/A;    Family History  Problem Relation Age of Onset  . Heart attack Father     Social History:  reports that she quit smoking about 14 years ago. She has never used smokeless tobacco. She reports that she does not drink alcohol or use illicit drugs.  Allergies: No Known Allergies  Medications reviewed.   ROS Negative   BP 132/85 mmHg  Pulse 80  Temp(Src) 97.8 F (36.6 C) (Oral)  Ht 5\' 5"  (1.651 m)  Wt 65.318 kg (144 lb)  BMI 23.96 kg/m2  Physical Exam NAD awake alert EYES: clear sclera Chest: decrease BS on base, no wheezes. S1,s2, no murmurs Abd: soft, NT, incisions c/d/i, no infection    No results found for this or any previous visit (from the past 48 hour(s)). No results found.  Assessment/Plan:  1. Difficulty breathing - CBC with Differential - Comprehensive metabolic panel - DG Chest 2 View - COPD exacerbation w bronchitis, may need antibiotics, has f/yu appt w pulmonary next week  From surgical perspective there is no real issues. She is not jaundiced and no abdominal pain. We  will order CBC< CMP and CXR. We will contact pulm to make sure she gets evaluatedsoon   Sterling Bigiego Norinne Jeane, MD [facility] General Surgeon  11/22/2015,12:33 PM

## 2015-11-22 NOTE — Patient Instructions (Signed)
You will need to go to the Registration desk in the Medical Mall right after your appointment today to have Labs and Chest X-ray completed.  Directions to Medical Mall: When leaving our office, go right. Go all of the way down to the very end of the hallway. You will have a purple wall in front of you. You will now have a tunnel to the hospital on your left hand side. Go through this tunnel and the elevators will be on your left. Go down to the 1st floor and take a slight left. The very first desk on the right hand side is the registration desk.  We will call you and your pulmonary doctor as soon as we have the results to these tests.

## 2015-11-22 NOTE — Telephone Encounter (Signed)
Phone call made to patient at this time. Explained results and that I [facility] [place]. She verbalizes understanding of this and thanked me for seeing her today and helping her out.  Call was made to Dr. Clovis FredricksonKasa's office at this time. Spoke with [place]. Explained situation to her and that patient's Oxygen saturation this am in clinic was 88% on RA. She has put note in for [facility] or Margie to call back.

## 2015-11-22 NOTE — Telephone Encounter (Signed)
Labs and Chest X-ray have resulted from appointment today. Results reviewed with Dr. Everlene FarrierPabon. Patient will be started on Cipro 500mg  BID x 10 days for Pneumonia and follow-up with Dr. Belia HemanKasa on Tuesday as scheduled.

## 2015-11-25 NOTE — Telephone Encounter (Signed)
Reviewed, agree with abx.

## 2015-11-25 NOTE — Telephone Encounter (Signed)
See notes in chart from Dr. Dema SeverinMungal. Nothing further needs to be ordered at this time. Their office will follow-up with patient at Tuesday appointment.

## 2015-11-25 NOTE — Telephone Encounter (Signed)
Noted! Thank you

## 2015-11-25 NOTE — Telephone Encounter (Signed)
Dr. Nicholos Johnsamachandran agreed with CXR and what has been ordered. Nothing further needed. Thanks

## 2015-11-25 NOTE — Telephone Encounter (Signed)
VM pt. Please advise on CXR. Thanks.

## 2015-11-26 ENCOUNTER — Encounter: Payer: Self-pay | Admitting: Internal Medicine

## 2015-11-26 ENCOUNTER — Telehealth: Payer: Self-pay | Admitting: Internal Medicine

## 2015-11-26 ENCOUNTER — Encounter (INDEPENDENT_AMBULATORY_CARE_PROVIDER_SITE_OTHER): Payer: Self-pay

## 2015-11-26 ENCOUNTER — Ambulatory Visit (INDEPENDENT_AMBULATORY_CARE_PROVIDER_SITE_OTHER): Payer: Medicare Other | Admitting: Internal Medicine

## 2015-11-26 VITALS — BP 132/76 | HR 81 | Ht 65.0 in | Wt 144.0 lb

## 2015-11-26 DIAGNOSIS — J189 Pneumonia, unspecified organism: Secondary | ICD-10-CM

## 2015-11-26 DIAGNOSIS — U071 COVID-19: Secondary | ICD-10-CM | POA: Insufficient documentation

## 2015-11-26 DIAGNOSIS — J432 Centrilobular emphysema: Secondary | ICD-10-CM

## 2015-11-26 DIAGNOSIS — J441 Chronic obstructive pulmonary disease with (acute) exacerbation: Secondary | ICD-10-CM | POA: Diagnosis not present

## 2015-11-26 DIAGNOSIS — R05 Cough: Secondary | ICD-10-CM | POA: Diagnosis not present

## 2015-11-26 DIAGNOSIS — R059 Cough, unspecified: Secondary | ICD-10-CM | POA: Insufficient documentation

## 2015-11-26 DIAGNOSIS — J181 Lobar pneumonia, unspecified organism: Secondary | ICD-10-CM

## 2015-11-26 HISTORY — DX: Pneumonia, unspecified organism: J18.9

## 2015-11-26 HISTORY — DX: COVID-19: U07.1

## 2015-11-26 MED ORDER — ALBUTEROL SULFATE (2.5 MG/3ML) 0.083% IN NEBU: 2.5000 mg | INHALATION_SOLUTION | RESPIRATORY_TRACT | Status: DC | PRN

## 2015-11-26 MED ORDER — PROAIR HFA 108 (90 BASE) MCG/ACT IN AERS: 2.0000 | INHALATION_SPRAY | RESPIRATORY_TRACT | Status: DC | PRN

## 2015-11-26 MED ORDER — LEVOFLOXACIN 500 MG PO TABS: 500.0000 mg | ORAL_TABLET | Freq: Every day | ORAL | Status: AC

## 2015-11-26 MED ORDER — FLUTTER DEVI: 1.0000 | Freq: Once | Status: DC

## 2015-11-26 MED ORDER — PREDNISONE 10 MG PO TABS: ORAL_TABLET | ORAL | Status: DC

## 2015-11-26 NOTE — Telephone Encounter (Signed)
[facility] [place] called regarding Proair and Albuterol nebulizer treatment, please call at # [phone] Tobi Bastosnna (pharmacist).

## 2015-11-26 NOTE — Assessment & Plan Note (Addendum)
X-ray with right lower lobe infiltrate on 6/23. Patient still symptomatic with cough, intermittent fever chills and fatigue.  Stop ciprofloxacin Plan: -Prednisone 30 mg daily with breakfast 5 days -Levaquin 500 mg 1 tab daily 7 days  -Acapella valve 10-15 times daily -avoid all forms of tobacco -If no improvement in clinical symptoms, we'll need to consider CT chest without contrast further and more detail evaluation of right lower lobe infiltrate

## 2015-11-26 NOTE — Progress Notes (Signed)
[facility] [place] [facility]     Date: 11/26/2015,   MRN# [id number] Stacey SalmonJoanne Durham 08-May-1946 Code Status:  Code Status History    Date Active Date Inactive Code Status Order ID Comments User Context   08/16/2015 11:33 AM 08/21/2015  7:28 PM Full Code [id number]  Adrian SaranSital Mody, MD Inpatient          AdmissionWeight: 144 lb (65.318 kg)                 CurrentWeight: 144 lb (65.318 kg)     CHIEF COMPLAINT:   Acute care visit for Pneumonia   HISTORY OF PRESENT ILLNESS   Patient presents today for an acute care visit. She has been having shortness of breath along with cough and intermittent sputum production for the past 2 weeks. Over the last 10 days she's had intermittent episodes of fever and chills along with weakness. She was seen at her surgeon's office last week for lap chole follow up, which she had witnessed wheezing at that time a chest x-ray was obtained, and reviewed, that showed a right lower lobe infiltrate suggestive of pneumonia. She was started on ciprofloxacin 500 mg twice a day, she is no longer having productive sputum, but still having intense cough shortness of breath and fatigue and malaise, especially over the weekend. Today she is present with a friend.   Current Medication:   Current outpatient prescriptions:  .  acidophilus (RISAQUAD) CAPS capsule, Take 1 capsule by mouth daily., Disp: , Rfl:  .  albuterol (PROVENTIL) (2.5 MG/3ML) 0.083% nebulizer solution, Take 1 vial by nebulization as needed. Reported on 10/11/2015, Disp: , Rfl:  .  calcium-vitamin D (OSCAL WITH D) 500-200 MG-UNIT tablet, Take 1 tablet by mouth., Disp: , Rfl:  .  ciprofloxacin (CIPRO) 500 MG tablet, Take 1 tablet (500 mg total) by mouth 2 (two) times daily., Disp: 20 tablet, Rfl: 0 .  levofloxacin (LEVAQUIN) 500 MG tablet, Take 1 tablet (500 mg total) by mouth daily., Disp: 7 tablet, Rfl: 0 .  montelukast (SINGULAIR) 10 MG tablet, Take 1 tablet by mouth daily., Disp: ,  Rfl: 3 .  predniSONE (DELTASONE) 10 MG tablet, TAKE 3 TABS (30MG ) x 5 DAYS, Disp: 15 tablet, Rfl: 0 .  PROAIR HFA 108 (90 Base) MCG/ACT inhaler, Inhale 2 puffs into the lungs as needed., Disp: , Rfl: 1 .  Respiratory Therapy Supplies (FLUTTER) DEVI, 1 each by Does not apply route once., Disp: 1 each, Rfl: 0 .  tiotropium (SPIRIVA) 18 MCG inhalation capsule, Place 1 capsule (18 mcg total) into inhaler and inhale daily., Disp: 30 capsule, Rfl: 12     ALLERGIES   Review of patient's allergies indicates no known allergies.     REVIEW OF SYSTEMS   Review of Systems  Constitutional: Positive for fever, chills and malaise/fatigue. Negative for weight loss.  HENT: Negative for congestion.   Respiratory: Positive for cough, sputum production, shortness of breath and wheezing. Negative for hemoptysis.   Cardiovascular: Negative for chest pain, palpitations and orthopnea.  Gastrointestinal: Negative for heartburn, nausea and vomiting.  All other systems reviewed and are negative.    VS: BP 132/76 mmHg  Pulse 81  Ht 5\' 5"  (1.651 m)  Wt 144 lb (65.318 kg)  BMI 23.96 kg/m2  SpO2 93%     PHYSICAL EXAM   Physical Exam  Constitutional: She is oriented to person, place, and time. She appears well-developed and well-nourished.  HENT:  Head: Normocephalic and atraumatic.  Mouth/Throat: No oropharyngeal exudate.  Eyes: EOM are normal. Pupils are equal, round, and reactive to light. No scleral icterus.  Cardiovascular: Normal rate, regular rhythm and normal heart sounds.   No murmur heard. Pulmonary/Chest: No stridor. No respiratory distress. She has no wheezes. She has rales.  B\L lower lobe rales, R>L  Musculoskeletal: Normal range of motion. She exhibits no edema.  Neurological: She is alert and oriented to person, place, and time.  Skin: Skin is warm.  Psychiatric: She has a normal mood and affect.   CT chest Lung cancer Screening: 08/2015 No nodules/massess PFT 08/2015 Ratio  52% fev1 1.1 46%  fvc 2.1 67%  6MWT WNL ONO WNL  CXR 6/23 FINDINGS: Lungs are hyperexpanded. There is streaky opacity in the lung bases most evident on the right. This has mildly increased when compared the prior study. Although this may be atelectasis or scarring. Pneumonia suspected. Remainder of the lungs is clear. No pleural effusion. No pneumothorax.  Cardiac silhouette is top-normal in size. Normal mediastinal and hilar contours.  Bony thorax is intact.  IMPRESSION: Right greater than left lung base opacity increased from the prior exam. Pneumonia suspected.    ASSESSMENT/PLAN   70 yo white female with Severe COPD based on Pulmonary function with Gold Stage A with h/o extensive smoking history no w RLL PNA and AECOPD   Chronic obstructive pulmonary disease (HCC) Gold COPD A Continue with Spiriva, avoid all forms of tobacco.   Pneumonia X-ray with right lower lobe infiltrate on 6/23. Patient still symptomatic with cough, intermittent fever chills and fatigue.  Stop ciprofloxacin Plan: -Prednisone 30 mg daily with breakfast 5 days -Levaquin 500 mg 1 tab daily 7 days  -Acapella valve 10-15 times daily -avoid all forms of tobacco -If no improvement in clinical symptoms, we'll need to consider CT chest without contrast further and more detail evaluation of right lower lobe infiltrate  Cough Related to COPD exacerbation -Avoid all fourths tobacco -Continue with Spiriva -Albuterol nebulizer 1 treatment 3 times a day 3 days then use only as needed  COPD exacerbation (HCC) Trigger: Right lower lobe pneumonia  Plan: -Prednisone 30 mg daily 5 days -Levaquin 500 mg 7 days 1 tablet daily -Avoid all forms of tobacco -Albuterol nebulizer treatment: 1 treatment 3 times a day 3 days, then use only as needed -Flutter valve 10-15 times daily.

## 2015-11-26 NOTE — Assessment & Plan Note (Signed)
Related to COPD exacerbation -Avoid all fourths tobacco -Continue with Spiriva -Albuterol nebulizer 1 treatment 3 times a day 3 days then use only as needed

## 2015-11-26 NOTE — Assessment & Plan Note (Signed)
Gold COPD A Continue with Spiriva, avoid all forms of tobacco.

## 2015-11-26 NOTE — Telephone Encounter (Signed)
Spoke with Tobi Bastosnna and clarified the Proair and Albuterol neb. Nothing further needed.

## 2015-11-26 NOTE — Patient Instructions (Addendum)
Follow up with Dr.Kasa in : 1 month - stop cipro - start levaquin 500mg  x 7 days - 1 tab daily - Prednisone 30 mg daily with breakfast x 5 days - avoid all forms of tobacco smoke - flutter valve 10-15 times per day - albuterol nebulizer -  1 treatment every 8 hours for the next 3 days, then use only if needed for shortness of breath\wheezing\recurrent cough.

## 2015-11-26 NOTE — Assessment & Plan Note (Signed)
Trigger: Right lower lobe pneumonia  Plan: -Prednisone 30 mg daily 5 days -Levaquin 500 mg 7 days 1 tablet daily -Avoid all forms of tobacco -Albuterol nebulizer treatment: 1 treatment 3 times a day 3 days, then use only as needed -Flutter valve 10-15 times daily.

## 2015-12-16 ENCOUNTER — Telehealth: Payer: Self-pay | Admitting: *Deleted

## 2015-12-16 ENCOUNTER — Telehealth: Payer: Self-pay | Admitting: Internal Medicine

## 2015-12-16 NOTE — Telephone Encounter (Signed)
Pt came by office and states that she has been losing her hair. Pt states she stopped her Spiriva over the weekend. Spoke with VM and he also informed pt that it could have been the Prednisone she was on for 5 days. Pt states she has an appt with you on 12/20/15 and will further discuss at visit if you want her to be put on a different medication. Thanks.

## 2015-12-16 NOTE — Telephone Encounter (Signed)
Patient says she stopped taking spireva as it is making her hair fall out.  Misty aware and is speaking with patient.

## 2015-12-20 ENCOUNTER — Encounter: Payer: Self-pay | Admitting: Internal Medicine

## 2015-12-20 ENCOUNTER — Telehealth: Payer: Self-pay | Admitting: Internal Medicine

## 2015-12-20 ENCOUNTER — Ambulatory Visit (INDEPENDENT_AMBULATORY_CARE_PROVIDER_SITE_OTHER): Payer: Medicare Other | Admitting: Internal Medicine

## 2015-12-20 VITALS — BP 132/72 | HR 109 | Ht 64.0 in | Wt 143.0 lb

## 2015-12-20 DIAGNOSIS — J189 Pneumonia, unspecified organism: Secondary | ICD-10-CM | POA: Diagnosis not present

## 2015-12-20 DIAGNOSIS — J181 Lobar pneumonia, unspecified organism: Principal | ICD-10-CM

## 2015-12-20 DIAGNOSIS — R062 Wheezing: Secondary | ICD-10-CM | POA: Diagnosis not present

## 2015-12-20 MED ORDER — UMECLIDINIUM BROMIDE 62.5 MCG/INH IN AEPB: 1.0000 | INHALATION_SPRAY | Freq: Every day | RESPIRATORY_TRACT | Status: AC

## 2015-12-20 MED ORDER — AZITHROMYCIN 250 MG PO TABS: ORAL_TABLET | ORAL | Status: DC

## 2015-12-20 MED ORDER — FLUTICASONE FUROATE-VILANTEROL 200-25 MCG/INH IN AEPB
1.0000 | INHALATION_SPRAY | Freq: Every day | RESPIRATORY_TRACT | Status: AC
Start: 2015-12-20 — End: 2015-12-21

## 2015-12-20 MED ORDER — PREDNISONE 50 MG PO TABS: 50.0000 mg | ORAL_TABLET | Freq: Every day | ORAL | Status: DC

## 2015-12-20 MED ORDER — IPRATROPIUM-ALBUTEROL 0.5-2.5 (3) MG/3ML IN SOLN
3.0000 mL | Freq: Once | RESPIRATORY_TRACT | Status: AC
  Administered 2015-12-20: 3 mL via RESPIRATORY_TRACT

## 2015-12-20 NOTE — Addendum Note (Signed)
Addended by: Maxwell MarionBLANKENSHIP, MARGIE A on: 12/20/2015 09:37 AM   Modules accepted: Orders

## 2015-12-20 NOTE — Addendum Note (Signed)
Addended by: Maxwell MarionBLANKENSHIP, Reinhardt Licausi A on: 12/20/2015 09:23 AM   Modules accepted: Orders

## 2015-12-20 NOTE — Progress Notes (Signed)
Patient ID: Stacey Durham, female   DOB: [birth year], 70 y.o.   MRN: [id number] Patient seen in the office today and instructed on use of Breo and Incruse.  Patient expressed understanding and demonstrated technique.

## 2015-12-20 NOTE — Progress Notes (Signed)
[facility] Stacey Durham     Date: 12/20/2015,   MRN# [id number] Stacey Durham [birth year] Code Status:  Code Status History    Date Active Date Inactive Code Status Order ID Comments User Context   08/16/2015 11:33 AM 08/21/2015  7:28 PM Full Code [id number]  Adrian Saran, MD Inpatient          AdmissionWeight: 143 lb (64.864 kg)                 CurrentWeight: 143 lb (64.864 kg)     CHIEF COMPLAINT:   Acute care visit for Pneumonia, still with wheezing and SOB   HISTORY OF PRESENT ILLNESS   Still with SOB, has stopped spiriva because she says it causes hair loss Still with wheezing, feels bad  Previous OV patient was seen for COPD exacerbation with RLL pneumonia willneed more prednisone and give douneb therapy in office today   Current Medication:   Current outpatient prescriptions:  .  acidophilus (RISAQUAD) CAPS capsule, Take 1 capsule by mouth daily., Disp: , Rfl:  .  albuterol (PROVENTIL) (2.5 MG/3ML) 0.083% nebulizer solution, Take 3 mLs (2.5 mg total) by nebulization every 4 (four) hours as needed. Reported on 10/11/2015, Disp: 125 mL, Rfl: 2 .  calcium-vitamin D (OSCAL WITH D) 500-200 MG-UNIT tablet, Take 1 tablet by mouth., Disp: , Rfl:  .  montelukast (SINGULAIR) 10 MG tablet, Take 1 tablet by mouth daily., Disp: , Rfl: 3 .  PROAIR HFA 108 (90 Base) MCG/ACT inhaler, Inhale 2 puffs into the lungs every 4 (four) hours as needed., Disp: 1 Inhaler, Rfl: 3 .  Respiratory Therapy Supplies (FLUTTER) DEVI, 1 each by Does not apply route once., Disp: 1 each, Rfl: 0 .  tiotropium (SPIRIVA) 18 MCG inhalation capsule, Place 1 capsule (18 mcg total) into inhaler and inhale daily. (Patient not taking: Reported on 12/20/2015), Disp: 30 capsule, Rfl: 12     ALLERGIES   Review of patient's allergies indicates no known allergies.     REVIEW OF SYSTEMS   Review of Systems  Constitutional: Positive for malaise/fatigue. Negative for fever, chills and  weight loss.  HENT: Negative for congestion.   Respiratory: Positive for cough, sputum production, shortness of breath and wheezing. Negative for hemoptysis.   Cardiovascular: Negative for chest pain, palpitations and orthopnea.  Gastrointestinal: Negative for heartburn, nausea and vomiting.  All other systems reviewed and are negative.    VS: BP 132/72 mmHg  Pulse 109  Ht  (1.626 m)  Wt 143 lb (64.864 kg)  BMI 24.53 kg/m2  SpO2 92%     PHYSICAL EXAM   Physical Exam  Constitutional: She is oriented to person, place, and time. She appears well-developed and well-nourished.  HENT:  Head: Normocephalic and atraumatic.  Mouth/Throat: No oropharyngeal exudate.  Eyes: EOM are normal. Pupils are equal, round, and reactive to light. No scleral icterus.  Cardiovascular: Normal rate, regular rhythm and normal heart sounds.   No murmur heard. Pulmonary/Chest: No stridor. No respiratory distress. She has wheezes. She has rales.  B\L lower lobe rales, R>L  Musculoskeletal: Normal range of motion. She exhibits no edema.  Neurological: She is alert and oriented to person, place, and time.  Skin: Skin is warm.  Psychiatric: She has a normal mood and affect.   CT chest Lung cancer Screening: 08/2015 No nodules/massess PFT 08/2015 Ratio 52% fev1 1.1 46%  fvc 2.1 67%  WNL ONO WNL  CXR 6/23 FINDINGS: Lungs are hyperexpanded. There is streaky opacity  in the lung bases most evident on the right. This has mildly increased when compared the prior study. Although this may be atelectasis or scarring. Pneumonia suspected. Remainder of the lungs is clear. No pleural effusion. No pneumothorax.  Cardiac silhouette is top-normal in size. Normal mediastinal and hilar contours.  Bony thorax is intact.  IMPRESSION: Right greater than left lung base opacity increased from the prior exam. Pneumonia suspected.    ASSESSMENT/PLAN   70 yo white female with Severe COPD based on  Pulmonary function with Gold Stage B with h/o extensive smoking history with persistant wheezing, SOB.   Chronic obstructive pulmonary disease (HCC) with exacerbation now Gold COPD B Will start Breo 200/25 and Incruse 62.5 daily -start prednisone 50 mg daily for 10 days -advised to use albuterol every 4 hrs until wheezing has stopped -will get CT chest in 2 weeks after prednisone therapy -z pak -no improvement in clinical symptoms, we'll get  CT chest without contrast further and more detail evaluation of right lower lobe infiltrate  Cough Related to COPD exacerbation -Avoid all fourths tobacco  Follow up in 2-3 weeks after CT chest  The Patient requires high complexity decision making for assessment and support, frequent evaluation and titration of therapies. Patient/Family are satisfied with Plan of action and management. All questions answered  Lucie LeatherKurian David Asheton Viramontes, M.D.  [facility]  Medical Director [facility] Medical Director [facility]

## 2015-12-20 NOTE — Telephone Encounter (Signed)
Pt informed that if the Breo and Incruse work then she can come by and pick up more samples till f/u per DK. Nothing further needed.

## 2015-12-20 NOTE — Telephone Encounter (Signed)
Pt states she received samples of inhalers this morniing at her appointment, she states the counter for one starts on 14-the "blue one" and the "green one" started on 7. She states this will not last her for a month. Not sure if she needs a rx or not. Please call and advise.

## 2015-12-20 NOTE — Patient Instructions (Signed)
1. z pack as prescribed 2. Prednisone 50 mg daily for 10 days 3.albuterol every 4 hrs until wheezing resolved 4.breo/incruse samples given 5.follow up after CT chest completed  Chronic Obstructive Pulmonary Disease Chronic obstructive pulmonary disease (COPD) is a common lung condition in which airflow from the lungs is limited. COPD is a general term that can be used to describe many different lung problems that limit airflow, including both chronic bronchitis and emphysema. If you have COPD, your lung function will probably never return to normal, but there are measures you can take to improve lung function and make yourself feel better. CAUSES   Smoking (common).  Exposure to secondhand smoke.  Genetic problems.  Chronic inflammatory lung diseases or recurrent infections. SYMPTOMS  Shortness of breath, especially with physical activity.  Deep, persistent (chronic) cough with a large amount of thick mucus.  Wheezing.  Rapid breaths (tachypnea).  Gray or bluish discoloration (cyanosis) of the skin, especially in your fingers, toes, or lips.  Fatigue.  Weight loss.  Frequent infections or episodes when breathing symptoms become much worse (exacerbations).  Chest tightness. DIAGNOSIS Your health care provider will take a medical history and perform a physical examination to diagnose COPD. Additional tests for COPD may include:  Lung (pulmonary) function tests.  Chest X-ray.  CT scan.  Blood tests. TREATMENT  Treatment for COPD may include:  Inhaler and nebulizer medicines. These help manage the symptoms of COPD and make your breathing more comfortable.  Supplemental oxygen. Supplemental oxygen is only helpful if you have a low oxygen level in your blood.  Exercise and physical activity. These are beneficial for nearly all people with COPD.  Lung surgery or transplant.  Nutrition therapy to gain weight, if you are underweight.  Pulmonary rehabilitation. This  may involve working with a team of health care providers and specialists, such as respiratory, occupational, and physical therapists. HOME CARE INSTRUCTIONS  Take all medicines (inhaled or pills) as directed by your health care provider.  Avoid over-the-counter medicines or cough syrups that dry up your airway (such as antihistamines) and slow down the elimination of secretions unless instructed otherwise by your health care provider.  If you are a smoker, the most important thing that you can do is stop smoking. Continuing to smoke will cause further lung damage and breathing trouble. Ask your health care provider for help with quitting smoking. He or she can direct you to community resources or hospitals that provide support.  Avoid exposure to irritants such as smoke, chemicals, and fumes that aggravate your breathing.  Use oxygen therapy and pulmonary rehabilitation if directed by your health care provider. If you require home oxygen therapy, ask your health care provider whether you should purchase a pulse oximeter to measure your oxygen level at home.  Avoid contact with individuals who have a contagious illness.  Avoid extreme temperature and humidity changes.  Eat healthy foods. Eating smaller, more frequent meals and resting before meals may help you maintain your strength.  Stay active, but balance activity with periods of rest. Exercise and physical activity will help you maintain your ability to do things you want to do.  Preventing infection and hospitalization is very important when you have COPD. Make sure to receive all the vaccines your health care provider recommends, especially the pneumococcal and influenza vaccines. Ask your health care provider whether you need a pneumonia vaccine.  Learn and use relaxation techniques to manage stress.  Learn and use controlled breathing techniques as directed by your  health care provider. Controlled breathing techniques  include:  Pursed lip breathing. Start by breathing in (inhaling) through your nose for 1 second. Then, purse your lips as if you were going to whistle and breathe out (exhale) through the pursed lips for 2 seconds.  Diaphragmatic breathing. Start by putting one hand on your abdomen just above your waist. Inhale slowly through your nose. The hand on your abdomen should move out. Then purse your lips and exhale slowly. You should be able to feel the hand on your abdomen moving in as you exhale.  Learn and use controlled coughing to clear mucus from your lungs. Controlled coughing is a series of short, progressive coughs. The steps of controlled coughing are: 1. Lean your head slightly forward. 2. Breathe in deeply using diaphragmatic breathing. 3. Try to hold your breath for 3 seconds. 4. Keep your mouth slightly open while coughing twice. 5. Spit any mucus out into a tissue. 6. Rest and repeat the steps once or twice as needed. SEEK MEDICAL CARE IF:  You are coughing up more mucus than usual.  There is a change in the color or thickness of your mucus.  Your breathing is more labored than usual.  Your breathing is faster than usual. SEEK IMMEDIATE MEDICAL CARE IF:  You have shortness of breath while you are resting.  You have shortness of breath that prevents you from:  Being able to talk.  Performing your usual physical activities.  You have chest pain lasting longer than 5 minutes.  Your skin color is more cyanotic than usual.  You measure low oxygen saturations for longer than 5 minutes with a pulse oximeter. MAKE SURE YOU:  Understand these instructions.  Will watch your condition.  Will get help right away if you are not doing well or get worse.   This information is not intended to replace advice given to you by your health care provider. Make sure you discuss any questions you have with your health care provider.   Document Released: 02/25/2005 Document Revised:  06/08/2014 Document Reviewed: 01/12/2013 Elsevier Interactive Patient Education [employer]

## 2016-01-06 ENCOUNTER — Ambulatory Visit
Admission: RE | Admit: 2016-01-06 | Discharge: 2016-01-06 | Disposition: A | Discharge status: Home / Self Care | Payer: Medicare Other | Source: Ambulatory Visit | Attending: Internal Medicine | Admitting: Internal Medicine

## 2016-01-06 DIAGNOSIS — I700 Atherosclerosis of aorta: Secondary | ICD-10-CM | POA: Insufficient documentation

## 2016-01-06 DIAGNOSIS — I2510 Atherosclerotic heart disease of native coronary artery without angina pectoris: Secondary | ICD-10-CM | POA: Insufficient documentation

## 2016-01-06 DIAGNOSIS — J189 Pneumonia, unspecified organism: Secondary | ICD-10-CM | POA: Insufficient documentation

## 2016-01-06 DIAGNOSIS — J181 Lobar pneumonia, unspecified organism: Secondary | ICD-10-CM

## 2016-01-06 DIAGNOSIS — J432 Centrilobular emphysema: Secondary | ICD-10-CM | POA: Diagnosis not present

## 2016-01-07 ENCOUNTER — Encounter: Payer: Self-pay | Admitting: Family Medicine

## 2016-01-07 ENCOUNTER — Ambulatory Visit (INDEPENDENT_AMBULATORY_CARE_PROVIDER_SITE_OTHER): Payer: Medicare Other | Admitting: Family Medicine

## 2016-01-07 VITALS — BP 100/70 | HR 85 | Temp 98.0°F | Ht 64.0 in | Wt 148.0 lb

## 2016-01-07 DIAGNOSIS — E039 Hypothyroidism, unspecified: Secondary | ICD-10-CM | POA: Insufficient documentation

## 2016-01-07 DIAGNOSIS — I700 Atherosclerosis of aorta: Secondary | ICD-10-CM

## 2016-01-07 DIAGNOSIS — I2510 Atherosclerotic heart disease of native coronary artery without angina pectoris: Secondary | ICD-10-CM

## 2016-01-07 DIAGNOSIS — R7989 Other specified abnormal findings of blood chemistry: Secondary | ICD-10-CM | POA: Diagnosis not present

## 2016-01-07 DIAGNOSIS — J432 Centrilobular emphysema: Secondary | ICD-10-CM | POA: Diagnosis not present

## 2016-01-07 DIAGNOSIS — L659 Nonscarring hair loss, unspecified: Secondary | ICD-10-CM | POA: Diagnosis not present

## 2016-01-07 DIAGNOSIS — J181 Lobar pneumonia, unspecified organism: Secondary | ICD-10-CM

## 2016-01-07 DIAGNOSIS — E038 Other specified hypothyroidism: Secondary | ICD-10-CM | POA: Insufficient documentation

## 2016-01-07 DIAGNOSIS — J189 Pneumonia, unspecified organism: Secondary | ICD-10-CM

## 2016-01-07 LAB — TSH: TSH: 3.44 u[IU]/mL (ref 0.35–4.50)

## 2016-01-07 LAB — T4, FREE: Free T4: 1.42 ng/dL (ref 0.60–1.60)

## 2016-01-07 NOTE — Patient Instructions (Addendum)
Sign release for records from Dr Clovis RileyMitchell. Good to meet you today, call us with questions. Labs today to check thyroid function.  Return as needed or in 5 months for medicare wellness visit

## 2016-01-07 NOTE — Progress Notes (Signed)
Pre visit review using our clinic review tool, if applicable. No additional management support is needed unless otherwise documented below in the visit note. 

## 2016-01-07 NOTE — Assessment & Plan Note (Signed)
Update TFTs today. Endorses some hypothyroid sxs of weight gain, cold intolerance, and hair loss. Also predisposed to looser stools

## 2016-01-07 NOTE — Assessment & Plan Note (Signed)
CT shows resolved PNA. Has f/u with pulm scheduled.

## 2016-01-07 NOTE — Assessment & Plan Note (Signed)
Check TFTs given recent abnormal TSH (?sick euthryoid during COPD exac hospitalization). Discussed telogen effluvium possibility after recent difficult stressful year for body (multiple hospitalizations for illness).

## 2016-01-07 NOTE — Assessment & Plan Note (Signed)
1v CAD by CT.

## 2016-01-07 NOTE — Assessment & Plan Note (Deleted)
Appreciate pulm care of patient. Upcoming f/u with Dr Belia HemanKasa to review recent CT scan.

## 2016-01-07 NOTE — Assessment & Plan Note (Signed)
May need statin. Await ASCVD evaluation.

## 2016-01-07 NOTE — Progress Notes (Signed)
BP 100/70 (BP Location: Left Arm, Patient Position: Sitting, Cuff Size: Normal)   Pulse 85   Temp 98 F (36.7 C) (Oral)   Ht 5\' 4"  (1.626 m)   Wt 148 lb (67.1 kg)   SpO2 95%   BMI 25.40 kg/m    CC: new pt to establish care Subjective:    Patient ID: Stacey Durham, female    DOB: [birth year], 70 y.o.   MRN: [id number]  HPI: Stacey Durham is a 70 y.o. female presenting on 01/07/2016 for Establish Care (Hair loss)   Prior saw [facility] PCP Dr Lupe Carney - records requested. Last CPE early this year - mammogram, DEXA.   Saw Jae Dire 07/2015 for hospital f/u visit for COPD exacerbation. Ex smoker - quit 2000s. She has established care with Dr Belia Heman. Recent recurrent COPD exacerbation and RLL PNA 10/2015. CT chest from yesterday showing mod cetrilobular emphysema, RML and RLL mild pneumonitis, aortic atherosclerosis and 1v CAD. Has f/u with Dr Belia Heman later this month.   Also had hospitalization eariler this year 10-27-15 for acute cholecystitis s/p cholecystectomy.   Main concern today is ongoing hair loss for last 2 months. Started biotin 1 wk ago. No new hair products. She had oil treatments by hair dresser. She takes vitamin E shampoo which hasn't helped. Denies life stress change over last few months.  Lab Results  Component Value Date   TSH 6.204 (H) 08/16/2015    Originally from [place] Widower - husband passed away 2010/10/27 Lives alone, 1 chihuahua GF of [place] Occ: retired, worked at [employer]: enjoys going to [employer]: good water, fruits/vegetables daily  Relevant past medical, surgical, family and social history reviewed and updated as indicated. Interim medical history since our last visit reviewed. Allergies and medications reviewed and updated. Current Outpatient Prescriptions on File Prior to Visit  Medication Sig  . albuterol (PROVENTIL) (2.5 MG/3ML) 0.083% nebulizer solution Take 3 mLs (2.5 mg total) by nebulization every 4 (four) hours as needed.  Reported on 10/11/2015  . calcium-vitamin D (OSCAL WITH D) 500-200 MG-UNIT tablet Take 1 tablet by mouth.  . montelukast (SINGULAIR) 10 MG tablet Take 1 tablet by mouth daily.  Marland Kitchen PROAIR HFA 108 (90 Base) MCG/ACT inhaler Inhale 2 puffs into the lungs every 4 (four) hours as needed.  Marland Kitchen Respiratory Therapy Supplies (FLUTTER) DEVI 1 each by Does not apply route once.   No current facility-administered medications on file prior to visit.     Review of Systems Per HPI unless specifically indicated in ROS section     Objective:    BP 100/70 (BP Location: Left Arm, Patient Position: Sitting, Cuff Size: Normal)   Pulse 85   Temp 98 F (36.7 C) (Oral)   Ht 5\' 4"  (1.626 m)   Wt 148 lb (67.1 kg)   SpO2 95%   BMI 25.40 kg/m   Wt Readings from Last 3 Encounters:  01/07/16 148 lb (67.1 kg)  12/20/15 143 lb (64.9 kg)  11/26/15 144 lb (65.3 kg)    Physical Exam  Constitutional: She appears well-developed and well-nourished. No distress.  HENT:  Mouth/Throat: Oropharynx is clear and moist. No oropharyngeal exudate.  Eyes: Conjunctivae are normal. Pupils are equal, round, and reactive to light. No scleral icterus.  Neck: Normal range of motion. Neck supple. No thyromegaly present.  Cardiovascular: Normal rate, regular rhythm, normal heart sounds and intact distal pulses.   No murmur heard. Pulmonary/Chest: Effort normal and breath sounds normal. No respiratory distress. She has  no wheezes. She has no rales.  Musculoskeletal: She exhibits no edema.  Lymphadenopathy:    She has no cervical adenopathy.  Skin: Skin is warm and dry. No rash noted.  Psychiatric: She has a normal mood and affect.  Tearful with discussion of husband's passing 4 yrs ago  Nursing note and vitals reviewed.  Results for orders placed or performed during the hospital encounter of 11/22/15  CBC with Differential/Platelet  Result Value Ref Range   WBC 10.7 3.6 - 11.0 K/uL   RBC 3.88 3.80 - 5.20 MIL/uL   Hemoglobin  11.8 (L) 12.0 - 16.0 g/dL   HCT 40.934.4 (L) 81.135.0 - 91.447.0 %   MCV 88.6 80.0 - 100.0 fL   MCH 30.3 26.0 - 34.0 pg   MCHC 34.2 32.0 - 36.0 g/dL   RDW 78.213.2 95.611.5 - 21.314.5 %   Platelets 343 150 - 440 K/uL   Neutrophils Relative % 74% %   Neutro Abs 8.0 (H) 1.4 - 6.5 K/uL   Lymphocytes Relative 13% %   Lymphs Abs 1.4 1.0 - 3.6 K/uL   Monocytes Relative 9% %   Monocytes Absolute 0.9 0.2 - 0.9 K/uL   Eosinophils Relative 3% %   Eosinophils Absolute 0.3 0 - 0.7 K/uL   Basophils Relative 1% %   Basophils Absolute 0.1 0 - 0.1 K/uL  Comprehensive metabolic panel  Result Value Ref Range   Sodium 139 135 - 145 mmol/L   Potassium 3.5 3.5 - 5.1 mmol/L   Chloride 103 101 - 111 mmol/L   CO2 26 22 - 32 mmol/L   Glucose, Bld 108 (H) 65 - 99 mg/dL   BUN 13 6 - 20 mg/dL   Creatinine, Ser 0.860.94 0.44 - 1.00 mg/dL   Calcium 9.0 8.9 - 57.810.3 mg/dL   Total Protein 6.9 6.5 - 8.1 g/dL   Albumin 3.3 (L) 3.5 - 5.0 g/dL   AST 16 15 - 41 U/L   ALT 14 14 - 54 U/L   Alkaline Phosphatase 94 38 - 126 U/L   Total Bilirubin 0.4 0.3 - 1.2 mg/dL   GFR calc non Af Amer >60 >60 mL/min   GFR calc Af Amer >60 >60 mL/min   Anion gap 10 5 - 15  No results found for: CHOL, HDL, LDLCALC, LDLDIRECT, TRIG, CHOLHDL     Assessment & Plan:   Problem List Items Addressed This Visit    Abnormal TSH    Update TFTs today. Endorses some hypothyroid sxs of weight gain, cold intolerance, and hair loss. Also predisposed to looser stools      Relevant Orders   TSH   T3   T4, free   Aortic atherosclerosis (HCC)    May need statin. Await ASCVD evaluation.       CAD (coronary artery disease)    1v CAD by CT.      Chronic obstructive pulmonary disease (HCC)    Appreciate pulm care of patient. Upcoming f/u with Dr Belia HemanKasa to review recent CT scan.       Relevant Medications   umeclidinium bromide (INCRUSE ELLIPTA) 62.5 MCG/INH AEPB   fluticasone furoate-vilanterol (BREO ELLIPTA) 100-25 MCG/INH AEPB   Hair loss - Primary    Check  TFTs given recent abnormal TSH (?sick euthryoid during COPD exac hospitalization). Discussed telogen effluvium possibility after recent difficult stressful year for body (multiple hospitalizations for illness).       Pneumonia    CT shows resolved PNA. Has f/u with pulm scheduled.  Relevant Medications   umeclidinium bromide (INCRUSE ELLIPTA) 62.5 MCG/INH AEPB   fluticasone furoate-vilanterol (BREO ELLIPTA) 100-25 MCG/INH AEPB    Other Visit Diagnoses   None.      Follow up plan: Return in about 5 months (around 06/08/2016), or as needed, for medicare wellness visit.  Eustaquio Boyden, MD

## 2016-01-07 NOTE — Assessment & Plan Note (Signed)
Appreciate pulm care of patient. Upcoming f/u with Dr Kasa to review recent CT scan.  

## 2016-01-08 LAB — T3: T3, Total: 103 ng/dL (ref 76–181)

## 2016-01-15 ENCOUNTER — Encounter: Payer: Self-pay | Admitting: Internal Medicine

## 2016-01-15 ENCOUNTER — Ambulatory Visit (INDEPENDENT_AMBULATORY_CARE_PROVIDER_SITE_OTHER): Payer: Medicare Other | Admitting: Internal Medicine

## 2016-01-15 VITALS — BP 122/70 | HR 78 | Ht 64.0 in | Wt 147.8 lb

## 2016-01-15 DIAGNOSIS — J432 Centrilobular emphysema: Secondary | ICD-10-CM | POA: Diagnosis not present

## 2016-01-15 MED ORDER — TIOTROPIUM BROMIDE MONOHYDRATE 18 MCG IN CAPS
18.0000 ug | ORAL_CAPSULE | Freq: Every day | RESPIRATORY_TRACT | 6 refills | Status: DC
Start: 2016-01-15 — End: 2016-07-02

## 2016-01-15 NOTE — Patient Instructions (Signed)
Stop BREO Stop Incruse Restart Spiriva Albuterol as needed Follow up in 6 months  Chronic Obstructive Pulmonary Disease Chronic obstructive pulmonary disease (COPD) is a common lung condition in which airflow from the lungs is limited. COPD is a general term that can be used to describe many different lung problems that limit airflow, including both chronic bronchitis and emphysema. If you have COPD, your lung function will probably never return to normal, but there are measures you can take to improve lung function and make yourself feel better. CAUSES   Smoking (common).  Exposure to secondhand smoke.  Genetic problems.  Chronic inflammatory lung diseases or recurrent infections. SYMPTOMS  Shortness of breath, especially with physical activity.  Deep, persistent (chronic) cough with a large amount of thick mucus.  Wheezing.  Rapid breaths (tachypnea).  Gray or bluish discoloration (cyanosis) of the skin, especially in your fingers, toes, or lips.  Fatigue.  Weight loss.  Frequent infections or episodes when breathing symptoms become much worse (exacerbations).  Chest tightness. DIAGNOSIS Your health care provider will take a medical history and perform a physical examination to diagnose COPD. Additional tests for COPD may include:  Lung (pulmonary) function tests.  Chest X-ray.  CT scan.  Blood tests. TREATMENT  Treatment for COPD may include:  Inhaler and nebulizer medicines. These help manage the symptoms of COPD and make your breathing more comfortable.  Supplemental oxygen. Supplemental oxygen is only helpful if you have a low oxygen level in your blood.  Exercise and physical activity. These are beneficial for nearly all people with COPD.  Lung surgery or transplant.  Nutrition therapy to gain weight, if you are underweight.  Pulmonary rehabilitation. This may involve working with a team of health care providers and specialists, such as respiratory,  occupational, and physical therapists. HOME CARE INSTRUCTIONS  Take all medicines (inhaled or pills) as directed by your health care provider.  Avoid over-the-counter medicines or cough syrups that dry up your airway (such as antihistamines) and slow down the elimination of secretions unless instructed otherwise by your health care provider.  If you are a smoker, the most important thing that you can do is stop smoking. Continuing to smoke will cause further lung damage and breathing trouble. Ask your health care provider for help with quitting smoking. He or she can direct you to community resources or hospitals that provide support.  Avoid exposure to irritants such as smoke, chemicals, and fumes that aggravate your breathing.  Use oxygen therapy and pulmonary rehabilitation if directed by your health care provider. If you require home oxygen therapy, ask your health care provider whether you should purchase a pulse oximeter to measure your oxygen level at home.  Avoid contact with individuals who have a contagious illness.  Avoid extreme temperature and humidity changes.  Eat healthy foods. Eating smaller, more frequent meals and resting before meals may help you maintain your strength.  Stay active, but balance activity with periods of rest. Exercise and physical activity will help you maintain your ability to do things you want to do.  Preventing infection and hospitalization is very important when you have COPD. Make sure to receive all the vaccines your health care provider recommends, especially the pneumococcal and influenza vaccines. Ask your health care provider whether you need a pneumonia vaccine.  Learn and use relaxation techniques to manage stress.  Learn and use controlled breathing techniques as directed by your health care provider. Controlled breathing techniques include:  Pursed lip breathing. Start by breathing in (  inhaling) through your nose for 1 second. Then, purse  your lips as if you were going to whistle and breathe out (exhale) through the pursed lips for 2 seconds.  Diaphragmatic breathing. Start by putting one hand on your abdomen just above your waist. Inhale slowly through your nose. The hand on your abdomen should move out. Then purse your lips and exhale slowly. You should be able to feel the hand on your abdomen moving in as you exhale.  Learn and use controlled coughing to clear mucus from your lungs. Controlled coughing is a series of short, progressive coughs. The steps of controlled coughing are: 1. Lean your head slightly forward. 2. Breathe in deeply using diaphragmatic breathing. 3. Try to hold your breath for 3 seconds. 4. Keep your mouth slightly open while coughing twice. 5. Spit any mucus out into a tissue. 6. Rest and repeat the steps once or twice as needed. SEEK MEDICAL CARE IF:  You are coughing up more mucus than usual.  There is a change in the color or thickness of your mucus.  Your breathing is more labored than usual.  Your breathing is faster than usual. SEEK IMMEDIATE MEDICAL CARE IF:  You have shortness of breath while you are resting.  You have shortness of breath that prevents you from:  Being able to talk.  Performing your usual physical activities.  You have chest pain lasting longer than 5 minutes.  Your skin color is more cyanotic than usual.  You measure low oxygen saturations for longer than 5 minutes with a pulse oximeter. MAKE SURE YOU:  Understand these instructions.  Will watch your condition.  Will get help right away if you are not doing well or get worse.   This information is not intended to replace advice given to you by your health care provider. Make sure you discuss any questions you have with your health care provider.   Document Released: 02/25/2005 Document Revised: 06/08/2014 Document Reviewed: 01/12/2013 Elsevier Interactive Patient Education [employer].

## 2016-01-15 NOTE — Progress Notes (Signed)
[facility]     Date: 01/15/2016,   MRN# [id number] Stacey Durham June 15, 1945 Code Status:  Code Status History    Date Active Date Inactive Code Status Order ID Comments User Context   08/16/2015 11:33 AM 08/21/2015  7:28 PM Full Code [id number]  Adrian SaranSital Mody, MD Inpatient          AdmissionWeight: 147 lb 12.8 oz (67 kg)                 CurrentWeight: 147 lb 12.8 oz (67 kg)     CHIEF COMPLAINT:   Follow up COPD   HISTORY OF PRESENT ILLNESS  No acute SOB, chronic DOE Patient wants to re-start Spiriva Has not used albuterol in the past month Breo and Incruse does not seem to help her   Previous OV patient was seen for COPD exacerbation with RLL pneumonia CT chest 12/2015 shows no signs of pneumonia images reviewed 01/15/2016   no signs of infection at this time  Current Medication:   Current Outpatient Prescriptions:  .  albuterol (PROVENTIL) (2.5 MG/3ML) 0.083% nebulizer solution, Take 3 mLs (2.5 mg total) by nebulization every 4 (four) hours as needed. Reported on 10/11/2015, Disp: 125 mL, Rfl: 2 .  Biotin [id number] MCG TABS, Take 1 tablet by mouth., Disp: , Rfl:  .  calcium-vitamin D (OSCAL WITH D) 500-200 MG-UNIT tablet, Take 1 tablet by mouth., Disp: , Rfl:  .  fluticasone furoate-vilanterol (BREO ELLIPTA) 100-25 MCG/INH AEPB, Inhale 1 puff into the lungs daily., Disp: , Rfl:  .  montelukast (SINGULAIR) 10 MG tablet, Take 1 tablet by mouth daily., Disp: , Rfl: 3 .  Multiple Vitamins-Minerals (MULTIVITAMIN ADULT PO), Take 1 tablet by mouth., Disp: , Rfl:  .  PROAIR HFA 108 (90 Base) MCG/ACT inhaler, Inhale 2 puffs into the lungs every 4 (four) hours as needed., Disp: 1 Inhaler, Rfl: 3 .  Probiotic Product (PROBIOTIC-10 PO), Take 1 tablet by mouth., Disp: , Rfl:  .  Respiratory Therapy Supplies (FLUTTER) DEVI, 1 each by Does not apply route once., Disp: 1 each, Rfl: 0 .  umeclidinium bromide (INCRUSE ELLIPTA) 62.5 MCG/INH AEPB, Inhale 1 puff  into the lungs daily., Disp: , Rfl:  .  vitamin B-12 (CYANOCOBALAMIN) 1000 MCG tablet, Take 1,000 mcg by mouth daily., Disp: , Rfl:      ALLERGIES   Review of patient's allergies indicates no known allergies.     REVIEW OF SYSTEMS   Review of Systems  Constitutional: Negative for chills, fever, malaise/fatigue and weight loss.  HENT: Negative for congestion.   Respiratory: Positive for shortness of breath. Negative for cough, hemoptysis, sputum production and wheezing.        Chronic SOB/DOE at baseline  Cardiovascular: Negative for chest pain, palpitations, orthopnea and leg swelling.  Gastrointestinal: Negative for heartburn, nausea and vomiting.  All other systems reviewed and are negative.    VS: BP 122/70 (BP Location: Left Arm, Cuff Size: Normal)   Pulse 78   Ht 5\' 4"  (1.626 m)   Wt 147 lb 12.8 oz (67 kg)   SpO2 99%   BMI 25.37 kg/m      PHYSICAL EXAM   Physical Exam  Constitutional: She is oriented to person, place, and time. She appears well-developed and well-nourished.  HENT:  Head: Normocephalic and atraumatic.  Mouth/Throat: No oropharyngeal exudate.  Eyes: EOM are normal. Pupils are equal, round, and reactive to light. No scleral icterus.  Cardiovascular: Normal rate, regular rhythm and normal heart  sounds.   No murmur heard. Pulmonary/Chest: No stridor. No respiratory distress. She has no wheezes. She has no rales.  Musculoskeletal: Normal range of motion. She exhibits no edema.  Neurological: She is alert and oriented to person, place, and time.  Skin: Skin is warm.  Psychiatric: She has a normal mood and affect.   CT chest Lung cancer Screening: 08/2015 No nodules/massess PFT 08/2015 Ratio 52% fev1 1.1 46%  fvc 2.1 67%  6MWT WNL ONO WNL  CXR 6/23 FINDINGS: Lungs are hyperexpanded. There is streaky opacity in the lung bases most evident on the right. This has mildly increased when compared the prior study. Although this may be atelectasis  or scarring. Pneumonia suspected. Remainder of the lungs is clear. No pleural effusion. No pneumothorax.  Cardiac silhouette is top-normal in size. Normal mediastinal and hilar contours.  Bony thorax is intact.  IMPRESSION: Right greater than left lung base opacity increased from the prior exam. Pneumonia suspected.  Ct chest 01/06/16 No masses no nodules no pneumonia Images reviewed 01/15/2016    ASSESSMENT/PLAN   70 yo white female with Severe COPD based on Pulmonary function with Gold Stage B with h/o extensive smoking history   Chronic obstructive pulmonary disease (HCC)  Gold COPD B -will stop Breo/Incruse -Restart Spiriva -albuterol as needed -recommend avoid smoke exposure Recommend exercise  Follow up in 6 months   The Patient requires high complexity decision making for assessment and support, frequent evaluation and titration of therapies. Patient/Family are satisfied with Plan of action and management. All questions answered  Lucie LeatherKurian David Vista Sawatzky, M.D.  [facility]  Medical Director [facility] Medical Director [facility] Cardio-P[facility] Department

## 2016-01-23 ENCOUNTER — Encounter: Payer: Self-pay | Admitting: Family Medicine

## 2016-01-23 ENCOUNTER — Ambulatory Visit (INDEPENDENT_AMBULATORY_CARE_PROVIDER_SITE_OTHER): Payer: Medicare Other | Admitting: Family Medicine

## 2016-01-23 VITALS — BP 118/78 | HR 64 | Temp 97.6°F | Wt 147.8 lb

## 2016-01-23 DIAGNOSIS — L03311 Cellulitis of abdominal wall: Secondary | ICD-10-CM

## 2016-01-23 DIAGNOSIS — L089 Local infection of the skin and subcutaneous tissue, unspecified: Secondary | ICD-10-CM | POA: Insufficient documentation

## 2016-01-23 DIAGNOSIS — L720 Epidermal cyst: Secondary | ICD-10-CM

## 2016-01-23 MED ORDER — CEPHALEXIN 500 MG PO CAPS
500.0000 mg | ORAL_CAPSULE | Freq: Three times a day (TID) | ORAL | 0 refills | Status: DC
Start: 2016-01-23 — End: 2016-02-04

## 2016-01-23 NOTE — Assessment & Plan Note (Signed)
Anticipate infected epidermal cyst with mild surrounding cellulitis. Treat with warm compresses and oral abx. Update if not improved with treatment or if progressing infection/coming to a had.

## 2016-01-23 NOTE — Patient Instructions (Signed)
I think you have infected skin cyst with surrounding skin infection Treat with antibiotic sent to pharmacy, warm compresses.  If not improving or area comes to a head, return for recheck.

## 2016-01-23 NOTE — Progress Notes (Signed)
   BP 118/78   Pulse 64   Temp 97.6 F (36.4 C) (Oral)   Wt 147 lb 12 oz (67 kg)   BMI 25.36 kg/m    CC: red spot on abdomen Subjective:    Patient ID: Stacey Durham, female    DOB: [birth year], 70 y.o.   MRN: [id number]  HPI: Stacey Durham is a 70 y.o. female presenting on 01/23/2016 for Recurrent Skin Infections (red and sore on abd)   Firm area on abdominal wall over last 1 year. Started noticing redness/swelling over last 1-2 days. Tender to touch. Some nausea as well.   No other skin lesions. No fevers/chills. No prior skin infections.  Relevant past medical, surgical, family and social history reviewed and updated as indicated. Interim medical history since our last visit reviewed. Allergies and medications reviewed and updated. Current Outpatient Prescriptions on File Prior to Visit  Medication Sig  . albuterol (PROVENTIL) (2.5 MG/3ML) 0.083% nebulizer solution Take 3 mLs (2.5 mg total) by nebulization every 4 (four) hours as needed. Reported on 10/11/2015  . Biotin [id number] MCG TABS Take 1 tablet by mouth.  . calcium-vitamin D (OSCAL WITH D) 500-200 MG-UNIT tablet Take 1 tablet by mouth.  . montelukast (SINGULAIR) 10 MG tablet Take 1 tablet by mouth daily.  . Multiple Vitamins-Minerals (MULTIVITAMIN ADULT PO) Take 1 tablet by mouth.  Marland Kitchen. PROAIR HFA 108 (90 Base) MCG/ACT inhaler Inhale 2 puffs into the lungs every 4 (four) hours as needed.  . Probiotic Product (PROBIOTIC-10 PO) Take 1 tablet by mouth.  . Respiratory Therapy Supplies (FLUTTER) DEVI 1 each by Does not apply route once.  . tiotropium (SPIRIVA) 18 MCG inhalation capsule Place 1 capsule (18 mcg total) into inhaler and inhale daily.  . vitamin B-12 (CYANOCOBALAMIN) 1000 MCG tablet Take 1,000 mcg by mouth daily.   No current facility-administered medications on file prior to visit.     Review of Systems Per HPI unless specifically indicated in ROS section     Objective:    BP 118/78   Pulse 64   Temp  97.6 F (36.4 C) (Oral)   Wt 147 lb 12 oz (67 kg)   BMI 25.36 kg/m   Wt Readings from Last 3 Encounters:  01/23/16 147 lb 12 oz (67 kg)  01/15/16 147 lb 12.8 oz (67 kg)  01/07/16 148 lb (67.1 kg)    Physical Exam  Constitutional: She appears well-developed and well-nourished. No distress.  Abdominal: Soft. Bowel sounds are normal. She exhibits no distension and no mass. There is no tenderness. There is no rebound and no guarding.  Skin: Skin is warm and dry. No rash noted. There is erythema.  Tender indurated nodule with mild surrounding erythema, no fluctuance at right lower abdomen  Nursing note and vitals reviewed.     Assessment & Plan:   Problem List Items Addressed This Visit    Cellulitis of right abdominal wall - Primary    Anticipate infected epidermal cyst with mild surrounding cellulitis. Treat with warm compresses and oral abx. Update if not improved with treatment or if progressing infection/coming to a had.       Other Visit Diagnoses   None.      Follow up plan: Return if symptoms worsen or fail to improve.  Eustaquio BoydenJavier Imara Standiford, MD

## 2016-01-23 NOTE — Progress Notes (Signed)
Pre visit review using our clinic review tool, if applicable. No additional management support is needed unless otherwise documented below in the visit note. 

## 2016-01-27 ENCOUNTER — Telehealth: Payer: Self-pay

## 2016-01-27 NOTE — Telephone Encounter (Signed)
Pt cyst on stomach is larger and reddish purple; pt thinks may be full of infection and request appt with Dr Reece AgarG. No fever. Pt scheduled to see Dr Reece AgarG on 01/28/16 at 2 pm (holding the 2:45 incase needs to open cyst).  If pt condition changes or worsens prior to appt pt will cb. FYI to Dr Reece AgarG.

## 2016-01-28 ENCOUNTER — Encounter: Payer: Self-pay | Admitting: Family Medicine

## 2016-01-28 ENCOUNTER — Ambulatory Visit (INDEPENDENT_AMBULATORY_CARE_PROVIDER_SITE_OTHER): Payer: Medicare Other | Admitting: Family Medicine

## 2016-01-28 VITALS — BP 128/76 | HR 80 | Temp 97.6°F | Wt 147.5 lb

## 2016-01-28 DIAGNOSIS — L02211 Cutaneous abscess of abdominal wall: Secondary | ICD-10-CM | POA: Diagnosis not present

## 2016-01-28 DIAGNOSIS — L089 Local infection of the skin and subcutaneous tissue, unspecified: Secondary | ICD-10-CM

## 2016-01-28 DIAGNOSIS — L720 Epidermal cyst: Secondary | ICD-10-CM | POA: Diagnosis not present

## 2016-01-28 MED ORDER — SULFAMETHOXAZOLE-TRIMETHOPRIM 800-160 MG PO TABS: 1.0000 | ORAL_TABLET | Freq: Two times a day (BID) | ORAL | 0 refills | Status: DC

## 2016-01-28 NOTE — Assessment & Plan Note (Addendum)
Right lower abdominal wall, s/p I&D today. Aftercare instructions provided. Finish keflex, add bactrim DS. RTC wound check 2-3 days.

## 2016-01-28 NOTE — Addendum Note (Signed)
Addended by: Eustaquio BoydenGUTIERREZ, Chiffon Kittleson on: 01/28/2016 05:02 PM   Modules accepted: Orders

## 2016-01-28 NOTE — Addendum Note (Signed)
Addended by: Eustaquio BoydenGUTIERREZ, Chasya Zenz on: 01/28/2016 02:07 PM   Modules accepted: Orders

## 2016-01-28 NOTE — Patient Instructions (Signed)
Finish keflex, add bactrim antibiotic twice daily for 7 days.   Abscess An abscess is an infected area that contains a collection of pus and debris.It can occur in almost any part of the body. An abscess is also known as a furuncle or boil. CAUSES  An abscess occurs when tissue gets infected. This can occur from blockage of oil or sweat glands, infection of hair follicles, or a minor injury to the skin. As the body tries to fight the infection, pus collects in the area and creates pressure under the skin. This pressure causes pain. People with weakened immune systems have difficulty fighting infections and get certain abscesses more often.  SYMPTOMS Usually an abscess develops on the skin and becomes a painful mass that is red, warm, and tender. If the abscess forms under the skin, you may feel a moveable soft area under the skin. Some abscesses break open (rupture) on their own, but most will continue to get worse without care. The infection can spread deeper into the body and eventually into the bloodstream, causing you to feel ill.  DIAGNOSIS  Your caregiver will take your medical history and perform a physical exam. A sample of fluid may also be taken from the abscess to determine what is causing your infection. TREATMENT  Your caregiver may prescribe antibiotic medicines to fight the infection. However, taking antibiotics alone usually does not cure an abscess. Your caregiver may need to make a small cut (incision) in the abscess to drain the pus. In some cases, gauze is packed into the abscess to reduce pain and to continue draining the area. HOME CARE INSTRUCTIONS   Only take over-the-counter or prescription medicines for pain, discomfort, or fever as directed by your caregiver.  If you were prescribed antibiotics, take them as directed. Finish them even if you start to feel better.  If gauze is used, follow your caregiver's directions for changing the gauze.  To avoid spreading the  infection:  Keep your draining abscess covered with a bandage.  Wash your hands well.  Do not share personal care items, towels, or whirlpools with others.  Avoid skin contact with others.  Keep your skin and clothes clean around the abscess.  Keep all follow-up appointments as directed by your caregiver. SEEK MEDICAL CARE IF:   You have increased pain, swelling, redness, fluid drainage, or bleeding.  You have muscle aches, chills, or a general ill feeling.  You have a fever. MAKE SURE YOU:   Understand these instructions.  Will watch your condition.  Will get help right away if you are not doing well or get worse.   This information is not intended to replace advice given to you by your health care provider. Make sure you discuss any questions you have with your health care provider.   Document Released: 02/25/2005 Document Revised: 11/17/2011 Document Reviewed: 07/31/2011 Elsevier Interactive Patient Education 2016 Else[employer] Inc[employer]  Incision and Drainage Incision and drainage is a procedure in which a sac-like structure (cystic structure) is opened and drained. The area to be drained usually contains material such as pus, fluid, or blood.  LET YOUR CAREGIVER KNOW ABOUT:   Allergies to medicine.  Medicines taken, including vitamins, herbs, eyedrops, over-the-counter medicines, and creams.  Use of steroids (by mouth or creams).  Previous problems with anesthetics or numbing medicines.  History of bleeding problems or blood clots.  Previous surgery.  Other health problems, including diabetes and kidney problems.  Possibility of pregnancy, if this applies. RISKS AND COMPLICATIONS  Pain.  Bleeding.  Scarring.  Infection. BEFORE THE PROCEDURE  You may need to have an ultrasound or other imaging tests to see how large or deep your cystic structure is. Blood tests may also be used to determine if you have an infection or how severe the infection is. You may need  to have a tetanus shot. PROCEDURE  The affected area is cleaned with a cleaning fluid. The cyst area will then be numbed with a medicine (local anesthetic). A small incision will be made in the cystic structure. A syringe or catheter may be used to drain the contents of the cystic structure, or the contents may be squeezed out. The area will then be flushed with a cleansing solution. After cleansing the area, it is often gently packed with a gauze or another wound dressing. Once it is packed, it will be covered with gauze and tape or some other type of wound dressing. AFTER THE PROCEDURE   Often, you will be allowed to go home right after the procedure.  You may be given antibiotic medicine to prevent or heal an infection.  If the area was packed with gauze or some other wound dressing, you will likely need to come back in 1 to 2 days to get it removed.  The area should heal in about 14 days.   This information is not intended to replace advice given to you by your health care provider. Make sure you discuss any questions you have with your health care provider.   Document Released: 11/11/2000 Document Revised: 11/17/2011 Document Reviewed: 07/13/2011 Elsevier Interactive Patient Education [employer]

## 2016-01-28 NOTE — Progress Notes (Addendum)
BP 128/76   Pulse 80   Temp 97.6 F (36.4 C) (Oral)   Wt 147 lb 8 oz (66.9 kg)   BMI 25.32 kg/m    CC: worsening infection Subjective:    Patient ID: Stacey Durham, female    DOB: 1945/12/23, 70 y.o.   MRN: [id number]  HPI: Stacey Durham is a 70 y.o. female presenting on 01/28/2016 for Follow-up   See prior note for details - seen here last week with R abd wall cellulitis, treated with warm compresses and keflex antibiotic. Worsening pain, swelling, erythema. No fever or nausea.   Lab Results  Component Value Date   CREATININE 0.94 11/22/2015     Relevant past medical, surgical, family and social history reviewed and updated as indicated. Interim medical history since our last visit reviewed. Allergies and medications reviewed and updated. Current Outpatient Prescriptions on File Prior to Visit  Medication Sig  . albuterol (PROVENTIL) (2.5 MG/3ML) 0.083% nebulizer solution Take 3 mLs (2.5 mg total) by nebulization every 4 (four) hours as needed. Reported on 10/11/2015  . Biotin 4098110000 MCG TABS Take 1 tablet by mouth.  . calcium-vitamin D (OSCAL WITH D) 500-200 MG-UNIT tablet Take 1 tablet by mouth.  . cephALEXin (KEFLEX) 500 MG capsule Take 1 capsule (500 mg total) by mouth 3 (three) times daily.  . montelukast (SINGULAIR) 10 MG tablet Take 1 tablet by mouth daily.  . Multiple Vitamins-Minerals (MULTIVITAMIN ADULT PO) Take 1 tablet by mouth.  Marland Kitchen. PROAIR HFA 108 (90 Base) MCG/ACT inhaler Inhale 2 puffs into the lungs every 4 (four) hours as needed.  . Probiotic Product (PROBIOTIC-10 PO) Take 1 tablet by mouth.  . Respiratory Therapy Supplies (FLUTTER) DEVI 1 each by Does not apply route once.  . tiotropium (SPIRIVA) 18 MCG inhalation capsule Place 1 capsule (18 mcg total) into inhaler and inhale daily.  . vitamin B-12 (CYANOCOBALAMIN) 1000 MCG tablet Take 1,000 mcg by mouth daily.   No current facility-administered medications on file prior to visit.     Review of  Systems Per HPI unless specifically indicated in ROS section     Objective:    BP 128/76   Pulse 80   Temp 97.6 F (36.4 C) (Oral)   Wt 147 lb 8 oz (66.9 kg)   BMI 25.32 kg/m   Wt Readings from Last 3 Encounters:  01/28/16 147 lb 8 oz (66.9 kg)  01/23/16 147 lb 12 oz (67 kg)  01/15/16 147 lb 12.8 oz (67 kg)    Physical Exam  Constitutional: She appears well-developed and well-nourished. No distress.  Abdominal: Soft. Bowel sounds are normal.    Tender indurated fluctuant lesion R lower abdomen  Skin: There is erythema.  Nursing note and vitals reviewed.   R abd wall I&D Meds, vitals, and allergies reviewed.  Indication: suspect infected epidermal cyst Pt complaints of: erythema, pain, swelling Location:R abdomen Size: 2cm diameter with surrounding erythema Verbal informed consent obtained.  Pt aware of risks not limited to but including infection, bleeding, damage to near by organs. Prep: betadine Anesthesia: 1%lidocaine with epi, good effect Incision made with #11 blade Would explored and loculations removed Wound packed with iodoform gauze Tolerated well Routine postprocedure instructions d/w pt- remove packing in 24-48h, keep area clean and bandaged, follow up if concerns/spreading erythema/pain.     Assessment & Plan:   Problem List Items Addressed This Visit    Infected epidermoid cyst - Primary    Right lower abdominal wall, s/p I&D today. Aftercare instructions  provided. Finish keflex, add bactrim DS. RTC wound check 2-3 days.      Relevant Medications   sulfamethoxazole-trimethoprim (BACTRIM DS,SEPTRA DS) 800-160 MG tablet   Other Relevant Orders   WOUND CULTURE    Other Visit Diagnoses   None.      Follow up plan: Return in about 2 days (around 01/30/2016), or if symptoms worsen or fail to improve, for follow up visit.  Eustaquio Boyden, MD

## 2016-01-28 NOTE — Progress Notes (Signed)
Pre visit review using our clinic review tool, if applicable. No additional management support is needed unless otherwise documented below in the visit note. 

## 2016-01-31 ENCOUNTER — Ambulatory Visit (INDEPENDENT_AMBULATORY_CARE_PROVIDER_SITE_OTHER): Payer: Medicare Other | Admitting: Family Medicine

## 2016-01-31 ENCOUNTER — Encounter: Payer: Self-pay | Admitting: Family Medicine

## 2016-01-31 VITALS — BP 112/70 | HR 78 | Temp 97.4°F | Wt 145.0 lb

## 2016-01-31 DIAGNOSIS — Z23 Encounter for immunization: Secondary | ICD-10-CM

## 2016-01-31 DIAGNOSIS — L089 Local infection of the skin and subcutaneous tissue, unspecified: Secondary | ICD-10-CM

## 2016-01-31 DIAGNOSIS — L720 Epidermal cyst: Secondary | ICD-10-CM

## 2016-01-31 LAB — WOUND CULTURE
GRAM STAIN: NONE SEEN
Gram Stain: NONE SEEN
Organism ID, Bacteria: NO GROWTH

## 2016-01-31 MED ORDER — HYDROCODONE-ACETAMINOPHEN 5-325 MG PO TABS: 1.0000 | ORAL_TABLET | Freq: Four times a day (QID) | ORAL | 0 refills | Status: DC | PRN

## 2016-01-31 NOTE — Progress Notes (Signed)
BP 112/70 (BP Location: Left Arm, Patient Position: Sitting, Cuff Size: Normal)   Pulse 78   Temp 97.4 F (36.3 C) (Oral)   Wt 145 lb (65.8 kg)   SpO2 94%   BMI 24.89 kg/m    CC: recheck cyst.  Subjective:    Patient ID: Stacey Durham, female    DOB: 01-06-1946, 70 y.o.   MRN: [id number]  HPI: Stacey Durham is a 70 y.o. female presenting on 01/31/2016 for Cyst (Follow-up Epidermoid Cyst)   See prior note for details. S/p I&D earlier this week for infected epidermal cyst that failed keflex treatment. Finishing and tolerating bactrim. No spreading redness. Marked pain especially when changing bandage not controlled with tylenol.   No fevers/chills.  She is regularly taking probiotic.  Relevant past medical, surgical, family and social history reviewed and updated as indicated. Interim medical history since our last visit reviewed. Allergies and medications reviewed and updated. Current Outpatient Prescriptions on File Prior to Visit  Medication Sig  . albuterol (PROVENTIL) (2.5 MG/3ML) 0.083% nebulizer solution Take 3 mLs (2.5 mg total) by nebulization every 4 (four) hours as needed. Reported on 10/11/2015  . Biotin 4098110000 MCG TABS Take 1 tablet by mouth.  . calcium-vitamin D (OSCAL WITH D) 500-200 MG-UNIT tablet Take 1 tablet by mouth.  . cephALEXin (KEFLEX) 500 MG capsule Take 1 capsule (500 mg total) by mouth 3 (three) times daily.  . montelukast (SINGULAIR) 10 MG tablet Take 1 tablet by mouth daily.  . Multiple Vitamins-Minerals (MULTIVITAMIN ADULT PO) Take 1 tablet by mouth.  Marland Kitchen. PROAIR HFA 108 (90 Base) MCG/ACT inhaler Inhale 2 puffs into the lungs every 4 (four) hours as needed.  . Probiotic Product (PROBIOTIC-10 PO) Take 1 tablet by mouth.  . Respiratory Therapy Supplies (FLUTTER) DEVI 1 each by Does not apply route once.  . tiotropium (SPIRIVA) 18 MCG inhalation capsule Place 1 capsule (18 mcg total) into inhaler and inhale daily.  . vitamin B-12 (CYANOCOBALAMIN) 1000  MCG tablet Take 1,000 mcg by mouth daily.   No current facility-administered medications on file prior to visit.     Review of Systems Per HPI unless specifically indicated in ROS section     Objective:    BP 112/70 (BP Location: Left Arm, Patient Position: Sitting, Cuff Size: Normal)   Pulse 78   Temp 97.4 F (36.3 C) (Oral)   Wt 145 lb (65.8 kg)   SpO2 94%   BMI 24.89 kg/m   Wt Readings from Last 3 Encounters:  01/31/16 145 lb (65.8 kg)  01/28/16 147 lb 8 oz (66.9 kg)  01/23/16 147 lb 12 oz (67 kg)    Physical Exam  Constitutional: She appears well-developed and well-nourished. No distress.  Abdominal:    Healing wound R lower abdomen Redressed with triple abx and non stick gauze.   Nursing note and vitals reviewed.  Results for orders placed or performed in visit on 01/28/16  WOUND CULTURE  Result Value Ref Range   Gram Stain Abundant    Gram Stain WBC present-predominately PMN    Gram Stain No Squamous Epithelial Cells Seen    Gram Stain No Organisms Seen    Organism ID, Bacteria NO GROWTH 2 DAYS    Lab Results  Component Value Date   CREATININE 0.94 11/22/2015       Assessment & Plan:   Problem List Items Addressed This Visit    Infected epidermoid cyst    S.p I&D on Tuesday. Finish bactrim. Healing well. Uncontrolled  pain - discussed ibuprofen 400mg  BID to TID with meals use and provided hydrocodone Rx to fill if breakthrough pain despite ibuprofen. Reviewed wound care as well.  Wound culture - NGTD       Other Visit Diagnoses   None.      Follow up plan: Return in about 4 days (around 02/04/2016), or as needed, for follow up visit.  Eustaquio Boyden, MD

## 2016-01-31 NOTE — Assessment & Plan Note (Addendum)
S.p I&D on Tuesday. Finish bactrim. Healing well. Uncontrolled pain - discussed ibuprofen 400mg  BID to TID with meals use and provided hydrocodone Rx to fill if breakthrough pain despite ibuprofen. Reviewed wound care as well.  Wound culture - NGTD

## 2016-01-31 NOTE — Patient Instructions (Addendum)
Flu shot today.  Finish bactrim antibiotic. Ok to take ibuprofen 400mg  twice to three times daily with meals.  May take hydrocodone narcotic for breakthrough pain.  Continue dressing change daily - may use antibiotic ointment with each dressing change.  Recheck on Tuesday.

## 2016-01-31 NOTE — Addendum Note (Signed)
Addended by: Eual FinesBRIDGES, Kyan Yurkovich P on: 01/31/2016 10:52 AM   Modules accepted: Orders

## 2016-01-31 NOTE — Progress Notes (Signed)
Pre visit review using our clinic review tool, if applicable. No additional management support is needed unless otherwise documented below in the visit note. 

## 2016-02-04 ENCOUNTER — Encounter: Payer: Self-pay | Admitting: Family Medicine

## 2016-02-04 ENCOUNTER — Ambulatory Visit (INDEPENDENT_AMBULATORY_CARE_PROVIDER_SITE_OTHER): Payer: Medicare Other | Admitting: Family Medicine

## 2016-02-04 VITALS — BP 124/68 | HR 76 | Temp 97.6°F | Wt 146.5 lb

## 2016-02-04 DIAGNOSIS — L089 Local infection of the skin and subcutaneous tissue, unspecified: Secondary | ICD-10-CM

## 2016-02-04 DIAGNOSIS — L720 Epidermal cyst: Secondary | ICD-10-CM

## 2016-02-04 NOTE — Assessment & Plan Note (Signed)
S/p I&D 1 wk ago. Continues healing well. No spreading redness. Discussed continued wound care. Pt without concerns today.

## 2016-02-04 NOTE — Progress Notes (Signed)
BP 124/68   Pulse 76   Temp 97.6 F (36.4 C) (Oral)   Wt 146 lb 8 oz (66.5 kg)   SpO2 95%   BMI 25.15 kg/m    CC: f/u I&D Subjective:    Patient ID: Stacey Durham, female    DOB: 05-18-1946, 70 y.o.   MRN: [id number]  HPI: Stacey Durham is a 70 y.o. female presenting on 02/04/2016 for Follow-up   See prior note for details. S/p I&D last week for infected epidermal cyst that failed keflex treatment. Finished bactrim course. No spreading redness. Last visit we recommended ibuprofen and provided Rx for hydrocodone for breakthrough pain.   Wound cx - NG.   Relevant past medical, surgical, family and social history reviewed and updated as indicated. Interim medical history since our last visit reviewed. Allergies and medications reviewed and updated. Current Outpatient Prescriptions on File Prior to Visit  Medication Sig  . albuterol (PROVENTIL) (2.5 MG/3ML) 0.083% nebulizer solution Take 3 mLs (2.5 mg total) by nebulization every 4 (four) hours as needed. Reported on 10/11/2015  . Biotin 4098110000 MCG TABS Take 1 tablet by mouth.  . calcium-vitamin D (OSCAL WITH D) 500-200 MG-UNIT tablet Take 1 tablet by mouth.  Marland Kitchen. HYDROcodone-acetaminophen (NORCO/VICODIN) 5-325 MG tablet Take 1 tablet by mouth every 6 (six) hours as needed for moderate pain.  . montelukast (SINGULAIR) 10 MG tablet Take 1 tablet by mouth daily.  . Multiple Vitamins-Minerals (MULTIVITAMIN ADULT PO) Take 1 tablet by mouth.  Marland Kitchen. PROAIR HFA 108 (90 Base) MCG/ACT inhaler Inhale 2 puffs into the lungs every 4 (four) hours as needed.  . Probiotic Product (PROBIOTIC-10 PO) Take 1 tablet by mouth.  . Respiratory Therapy Supplies (FLUTTER) DEVI 1 each by Does not apply route once.  . tiotropium (SPIRIVA) 18 MCG inhalation capsule Place 1 capsule (18 mcg total) into inhaler and inhale daily.  . vitamin B-12 (CYANOCOBALAMIN) 1000 MCG tablet Take 1,000 mcg by mouth daily.   No current facility-administered medications on file  prior to visit.     Review of Systems Per HPI unless specifically indicated in ROS section     Objective:    BP 124/68   Pulse 76   Temp 97.6 F (36.4 C) (Oral)   Wt 146 lb 8 oz (66.5 kg)   SpO2 95%   BMI 25.15 kg/m   Wt Readings from Last 3 Encounters:  02/04/16 146 lb 8 oz (66.5 kg)  01/31/16 145 lb (65.8 kg)  01/28/16 147 lb 8 oz (66.9 kg)    Physical Exam  Constitutional: She appears well-developed and well-nourished. No distress.  Abdominal:    Healing wound R lower abdomen Redressed with triple abx and non stick gauze.   Nursing note and vitals reviewed.  Results for orders placed or performed in visit on 01/28/16  WOUND CULTURE  Result Value Ref Range   Gram Stain Abundant    Gram Stain WBC present-predominately PMN    Gram Stain No Squamous Epithelial Cells Seen    Gram Stain No Organisms Seen    Organism ID, Bacteria NO GROWTH 2 DAYS       Assessment & Plan:   Problem List Items Addressed This Visit    Infected epidermoid cyst - Primary    S/p I&D 1 wk ago. Continues healing well. No spreading redness. Discussed continued wound care. Pt without concerns today.        Other Visit Diagnoses   None.      Follow up plan: No  Follow-up on file.  Ria Bush, MD

## 2016-02-04 NOTE — Progress Notes (Signed)
Pre visit review using our clinic review tool, if applicable. No additional management support is needed unless otherwise documented below in the visit note. 

## 2016-04-01 ENCOUNTER — Telehealth: Payer: Self-pay | Admitting: Internal Medicine

## 2016-04-01 DIAGNOSIS — J449 Chronic obstructive pulmonary disease, unspecified: Secondary | ICD-10-CM

## 2016-04-01 NOTE — Telephone Encounter (Signed)
Pt calling stating she her nebulizer is not working And she has a cold  Would need a new one  Please advise.

## 2016-04-01 NOTE — Telephone Encounter (Signed)
Will place order to get pt a nebulizer machine. Nothing further needed.

## 2016-04-06 ENCOUNTER — Telehealth: Payer: Self-pay | Admitting: Internal Medicine

## 2016-04-06 NOTE — Telephone Encounter (Signed)
Patient will need to come in for further evaluation.

## 2016-04-06 NOTE — Telephone Encounter (Signed)
Pt informed that she needs to come in. appt scheduled for 04/07/16 @ 11:45 am. Informed pt if breathing got worse through the night to go to ER. Pt verbalized understanding. Nothing further needed.

## 2016-04-06 NOTE — Telephone Encounter (Signed)
Pt states she has had a cold for about 2 weeks, and now she is "rattling" please call.

## 2016-04-06 NOTE — Telephone Encounter (Signed)
Pt states she has got a cold x 2 weeks. Prod cough w/white mucus, wheezing. Using Albuterol neb 3-4 times daily, Spiriva, Mucinex and Proair. Pt is asking if we can send her something in or do you want me to bring her in to be seen? DK seen her last 01/15/16. Please advise.

## 2016-04-07 ENCOUNTER — Encounter: Payer: Self-pay | Admitting: Internal Medicine

## 2016-04-07 ENCOUNTER — Ambulatory Visit (INDEPENDENT_AMBULATORY_CARE_PROVIDER_SITE_OTHER): Payer: Medicare Other | Admitting: Internal Medicine

## 2016-04-07 VITALS — BP 110/68 | HR 69 | Temp 97.5°F | Ht 65.0 in | Wt 147.0 lb

## 2016-04-07 DIAGNOSIS — J449 Chronic obstructive pulmonary disease, unspecified: Secondary | ICD-10-CM | POA: Diagnosis not present

## 2016-04-07 MED ORDER — AZITHROMYCIN 250 MG PO TABS: ORAL_TABLET | ORAL | 0 refills | Status: AC

## 2016-04-07 MED ORDER — PREDNISONE 10 MG PO TABS: ORAL_TABLET | ORAL | 0 refills | Status: DC

## 2016-04-07 MED ORDER — ROFLUMILAST 500 MCG PO TABS: 500.0000 ug | ORAL_TABLET | Freq: Every day | ORAL | 4 refills | Status: DC

## 2016-04-07 NOTE — Patient Instructions (Signed)
Prednisone taper: Prednisone 10 mg tabs x  42.  Take 6 tablets on day 1,2 Take 5 tablets on day 3,4 Take 4 tablets on day 5,6 Take 3 tablets on day 7,8 Take 2 tablets on day 9,10 Take 1 tablet on day 11,12 Then stop.   --Azithromycin (Z-pack).   --Daliresp 500 mg daily.

## 2016-04-07 NOTE — Addendum Note (Signed)
Addended by: Meyer CoryAHMAD, MISTY R on: 04/07/2016 12:27 PM   Modules accepted: Orders

## 2016-04-07 NOTE — Progress Notes (Signed)
[facility]     Date: 04/07/2016,   MRN# [id number] Chales SalmonJoanne Wigley 1945/06/17 Code Status:  Code Status History    Date Active Date Inactive Code Status Order ID Comments User Context   08/16/2015 11:33 AM 08/21/2015  7:28 PM Full Code [id number]  Adrian SaranSital Mody, MD Inpatient          AdmissionWeight: 147 lb (66.7 kg)                 CurrentWeight: 147 lb (66.7 kg)     CHIEF COMPLAINT:   Follow up COPD and sick visit.    HISTORY OF PRESENT ILLNESS  She was on breo and incruz in the past, but currently she is currently on spiriva, and nebs 3 or 4 times per day currently,  normally she does not use a nebulizer. She has had a few episodes of AECOPD this year and has been hospitalized for the same including pneumonia.   This currently episode started about 2 weeks ago, and she felt that it got a bit better but then got worse again.  Her husband is present today and made her come in today.  She coughs a lot at night.   Current Medication:   Current Outpatient Prescriptions:  .  albuterol (PROVENTIL) (2.5 MG/3ML) 0.083% nebulizer solution, Take 3 mLs (2.5 mg total) by nebulization every 4 (four) hours as needed. Reported on 10/11/2015, Disp: 125 mL, Rfl: 2 .  Biotin 5621310000 MCG TABS, Take 1 tablet by mouth., Disp: , Rfl:  .  calcium-vitamin D (OSCAL WITH D) 500-200 MG-UNIT tablet, Take 1 tablet by mouth., Disp: , Rfl:  .  dextromethorphan-guaiFENesin (MUCINEX DM) 30-600 MG 12hr tablet, Take 1 tablet by mouth 2 (two) times daily., Disp: , Rfl:  .  HYDROcodone-acetaminophen (NORCO/VICODIN) 5-325 MG tablet, Take 1 tablet by mouth every 6 (six) hours as needed for moderate pain., Disp: 20 tablet, Rfl: 0 .  montelukast (SINGULAIR) 10 MG tablet, Take 1 tablet by mouth daily., Disp: , Rfl: 3 .  Multiple Vitamins-Minerals (MULTIVITAMIN ADULT PO), Take 1 tablet by mouth., Disp: , Rfl:  .  PROAIR HFA 108 (90 Base) MCG/ACT inhaler, Inhale 2 puffs into the lungs  every 4 (four) hours as needed., Disp: 1 Inhaler, Rfl: 3 .  Probiotic Product (PROBIOTIC-10 PO), Take 1 tablet by mouth., Disp: , Rfl:  .  Respiratory Therapy Supplies (FLUTTER) DEVI, 1 each by Does not apply route once., Disp: 1 each, Rfl: 0 .  tiotropium (SPIRIVA) 18 MCG inhalation capsule, Place 1 capsule (18 mcg total) into inhaler and inhale daily., Disp: 30 capsule, Rfl: 6 .  vitamin B-12 (CYANOCOBALAMIN) 1000 MCG tablet, Take 1,000 mcg by mouth daily., Disp: , Rfl:      ALLERGIES   Patient has no known allergies.     REVIEW OF SYSTEMS   Review of Systems  Constitutional: Negative for chills, fever, malaise/fatigue and weight loss.  HENT: Negative for congestion.   Respiratory: Positive for cough, sputum production and shortness of breath. Negative for hemoptysis and wheezing.        Chronic SOB/DOE at baseline  Cardiovascular: Negative for chest pain, palpitations, orthopnea and leg swelling.  Gastrointestinal: Negative for heartburn, nausea and vomiting.  All other systems reviewed and are negative.    VS: BP 110/68 (BP Location: Right Arm, Cuff Size: Normal)   Pulse 69   Temp 97.5 F (36.4 C) (Oral)   Ht 5\' 5"  (1.651 m)   Wt 147 lb (66.7 kg)  SpO2 96%   BMI 24.46 kg/m      PHYSICAL EXAM   Physical Exam  Constitutional: She is oriented to person, place, and time. She appears well-developed and well-nourished.  HENT:  Head: Normocephalic and atraumatic.  Mouth/Throat: No oropharyngeal exudate.  Eyes: EOM are normal. Pupils are equal, round, and reactive to light. No scleral icterus.  Cardiovascular: Normal rate, regular rhythm and normal heart sounds.   No murmur heard. Pulmonary/Chest: No stridor. No respiratory distress. She has wheezes. She has rales.  Musculoskeletal: Normal range of motion. She exhibits no edema.  Neurological: She is alert and oriented to person, place, and time.  Skin: Skin is warm.  Psychiatric: She has a normal mood and affect.     CT chest Lung cancer Screening: 08/2015 No nodules/massess PFT 08/2015 Ratio 52% fev1 1.1 46%  fvc 2.1 67%  6MWT WNL ONO WNL  CXR 6/23 FINDINGS: Lungs are hyperexpanded. There is streaky opacity in the lung bases most evident on the right. This has mildly increased when compared the prior study. Although this may be atelectasis or scarring. Pneumonia suspected. Remainder of the lungs is clear. No pleural effusion. No pneumothorax.  Cardiac silhouette is top-normal in size. Normal mediastinal and hilar contours.  Bony thorax is intact.  IMPRESSION: Right greater than left lung base opacity increased from the prior exam. Pneumonia suspected.  Ct chest 01/06/16 No masses no nodules no pneumonia Images reviewed 04/07/2016    ASSESSMENT/PLAN   70 yo white female with Severe COPD now with acute exacerbation.   Chronic obstructive pulmonary disease (HCC)  Gold COPD D -continue spiriva.  -albuterol nebs as needed -Start daliresp 500 mg daily. If not covered, will consider start azithromycin 250 mg three times weekly.   Follow up in 3 months   Deep Nicholos Johnsamachandran, M.D.

## 2016-04-09 ENCOUNTER — Telehealth: Payer: Self-pay | Admitting: *Deleted

## 2016-04-09 NOTE — Telephone Encounter (Signed)
Pt came by and stated that [facility] but she is unable to do it due to her breathing issues. She is asking if we can write her a letter stating she is unable to come to the gym due to her breathing issues so that she can get her money back and they will cancel her membership. Please advise.

## 2016-04-13 ENCOUNTER — Telehealth: Payer: Self-pay | Admitting: *Deleted

## 2016-04-13 ENCOUNTER — Encounter: Payer: Self-pay | Admitting: *Deleted

## 2016-04-13 NOTE — Telephone Encounter (Signed)
Submitted PA for Daliresp thru [employer]. PA# [id number] Key: [id number].  Will await response.  [facility] [phone][id number] Judithann Sheen- [place] - [address] [phone] (Phone) [phone] (Fax)

## 2016-04-13 NOTE — Telephone Encounter (Signed)
Letter printed and placed in DK's folder to sign.

## 2016-04-13 NOTE — Telephone Encounter (Signed)
Please write letter.  I will sign.

## 2016-04-14 NOTE — Telephone Encounter (Signed)
Still pending under CMM.

## 2016-04-15 NOTE — Telephone Encounter (Signed)
Daliresp approved until 05/31/2017. Pharmacy informed.

## 2016-04-17 ENCOUNTER — Telehealth: Payer: Self-pay | Admitting: Internal Medicine

## 2016-04-17 NOTE — Telephone Encounter (Signed)
Patient boyfriend in the office today to discuss medication reaction and picking up this form.  Patient waiting in lobby.

## 2016-04-17 NOTE — Telephone Encounter (Signed)
error 

## 2016-04-17 NOTE — Telephone Encounter (Signed)
Gave letter to pt's friend as requested. Will call pt due to SE of Daliresp.

## 2016-04-17 NOTE — Telephone Encounter (Signed)
LMOM for pt to return call. 

## 2016-04-20 NOTE — Telephone Encounter (Signed)
Spoke with pt and she states she is have diarrhea daily and thinks it is due to [employer]ali[employer]. Informed pt she could stop the Daliresp if she thought it was getting no better. Pt thinks it could be due to the Prednisone she was just on and a combination of other meds. Informed pt if she decides to stop Daliresp to give us a call back. Nothing further needed.

## 2016-04-29 ENCOUNTER — Encounter: Payer: Self-pay | Admitting: Primary Care

## 2016-04-29 ENCOUNTER — Ambulatory Visit (INDEPENDENT_AMBULATORY_CARE_PROVIDER_SITE_OTHER): Payer: Medicare Other | Admitting: Primary Care

## 2016-04-29 VITALS — BP 130/86 | HR 78 | Temp 97.7°F | Ht 65.0 in | Wt 147.4 lb

## 2016-04-29 DIAGNOSIS — M542 Cervicalgia: Secondary | ICD-10-CM | POA: Diagnosis not present

## 2016-04-29 MED ORDER — TIZANIDINE HCL 2 MG PO TABS: ORAL_TABLET | ORAL | 0 refills | Status: DC

## 2016-04-29 NOTE — Progress Notes (Signed)
Subjective:    Patient ID: Stacey Durham, female    DOB: 1945-09-30, 70 y.o.   MRN: [id number]  HPI  Stacey Durham is a 70 year old female who presents today with a chief complaint of neck pain. Her pain is located to the the lower base of cervical spine that has been present for the past 1-2 weeks. She describes her pain as "sore and tight" which is now uncomfortable to the right lower neck. She was working to put in a sunroom in her house 1-2 weeks ago.   She has a history of cervical fusion to C3-5 in the 1980's and has had no pain or trouble with her neck since. She denies numbness/tingling, trauma, fevers, cough. She's taken ibuprofen, tylenol, and using a heating pad without much improvement.   Review of Systems  Constitutional: Negative for chills and fever.  Respiratory: Negative for cough.   Musculoskeletal: Positive for neck pain and neck stiffness.  Neurological: Negative for numbness.       Past Medical History:  Diagnosis Date  . Acute cholecystitis   . Cholecystitis with cholelithiasis 10/11/2015  . COPD (chronic obstructive pulmonary disease) (HCC)   . Ex-smoker    quit 2000s     Social History   Social History  . Marital status: Widowed    Spouse name: N/A  . Number of children: N/A  . Years of education: N/A   Occupational History  . Not on file.   Social History Main Topics  . Smoking status: Former Smoker    Packs/day: 1.00    Years: 40.00    Quit date: 06/01/2001  . Smokeless tobacco: Never Used  . Alcohol use No  . Drug use: No  . Sexual activity: Not on file   Other Topics Concern  . Not on file   Social History Narrative   [place]   Widower - husband passed away 2012   Lives alone, 1 chihuahua   GF of Stacey SpiroKen Durham   Occ: retired, worked at [employer]   Activity: [place] [employer]   Diet: good water, fruits/vegetables daily    Past Surgical History:  Procedure Laterality Date  . ABDOMINAL HYSTERECTOMY   1978   part of an ovary remained  . APPENDECTOMY  1978  . CATARACT EXTRACTION, BILATERAL  2013  . CHOLECYSTECTOMY N/A 10/11/2015   Procedure: LAPAROSCOPIC CHOLECYSTECTOMY;  Surgeon: Leafy Roiego F Pabon, MD;  Location: [facility] ORS;  Service: General;  Laterality: N/A;  . TONSILLECTOMY      Family History  Problem Relation Age of Onset  . Heart attack Father 972  . Diabetes Father   . Diabetes Mother   . Stroke Neg Hx   . Cancer Neg Hx     No Known Allergies  Current Outpatient Prescriptions on File Prior to Visit  Medication Sig Dispense Refill  . albuterol (PROVENTIL) (2.5 MG/3ML) 0.083% nebulizer solution Take 3 mLs (2.5 mg total) by nebulization every 4 (four) hours as needed. Reported on 10/11/2015 125 mL 2  . Biotin 4098110000 MCG TABS Take 1 tablet by mouth.    . calcium-vitamin D (OSCAL WITH D) 500-200 MG-UNIT tablet Take 1 tablet by mouth.    Marland Kitchen. HYDROcodone-acetaminophen (NORCO/VICODIN) 5-325 MG tablet Take 1 tablet by mouth every 6 (six) hours as needed for moderate pain. 20 tablet 0  . montelukast (SINGULAIR) 10 MG tablet Take 1 tablet by mouth daily.  3  . Multiple Vitamins-Minerals (MULTIVITAMIN ADULT PO) Take 1 tablet by mouth.    .Marland Kitchen  PROAIR HFA 108 (90 Base) MCG/ACT inhaler Inhale 2 puffs into the lungs every 4 (four) hours as needed. 1 Inhaler 3  . Probiotic Product (PROBIOTIC-10 PO) Take 1 tablet by mouth.    . Respiratory Therapy Supplies (FLUTTER) DEVI 1 each by Does not apply route once. 1 each 0  . roflumilast (DALIRESP) 500 MCG TABS tablet Take 1 tablet (500 mcg total) by mouth daily. 30 tablet 4  . tiotropium (SPIRIVA) 18 MCG inhalation capsule Place 1 capsule (18 mcg total) into inhaler and inhale daily. 30 capsule 6  . vitamin B-12 (CYANOCOBALAMIN) 1000 MCG tablet Take 1,000 mcg by mouth daily.     No current facility-administered medications on file prior to visit.     BP 130/86   Pulse 78   Temp 97.7 F (36.5 C) (Oral)   Ht 5\' 5"  (1.651 m)   Wt 147 lb 6.4 oz (66.9 kg)    SpO2 98%   BMI 24.53 kg/m    Objective:   Physical Exam  Constitutional: She appears well-nourished.  Neck: Neck supple. Muscular tenderness present. No spinous process tenderness present. No neck rigidity. Decreased range of motion present.  Decreased ROM with flexion, extension, lateral rotation.  Cardiovascular: Normal rate and regular rhythm.   Pulmonary/Chest: Effort normal and breath sounds normal.  Skin: Skin is warm and dry.          Assessment & Plan:  Neck Pain:  Ongoing for the past 1-2 weeks. Was working on her sunroom in her house at that time. Little to no improvement with OTC treatment. Exam today with decreased ROM, muscle tenderness/spasm to base of neck. Suspect neck pain secondary to muscle spasm. Do no believe imaging is necessary given that she has no radiculopathy or trauma. Rx for low dose Tizanidine sent to pharmacy. Continue ibuprofen or tylenol. Discussed use of heating pad. Follow up PRN.  Morrie Sheldonlark,Dayonna Selbe Kendal, NP

## 2016-04-29 NOTE — Progress Notes (Signed)
Pre visit review using our clinic review tool, if applicable. No additional management support is needed unless otherwise documented below in the visit note. 

## 2016-04-29 NOTE — Patient Instructions (Signed)
Your symptoms represent a muscle spasm.   Try Tizanidine tablets. Take 1-2 tablets by mouth every 8 hours as needed for neck pain/muscle spasm.   You may continue ibuprofen or tylenol.  Continue application of a heating pad at least three times daily for 30 minute intervals.  Please notify us if no improvement in 1 week.  It was a pleasure meeting you!

## 2016-05-05 ENCOUNTER — Ambulatory Visit: Payer: Medicare Other | Admitting: Family Medicine

## 2016-05-05 ENCOUNTER — Ambulatory Visit (INDEPENDENT_AMBULATORY_CARE_PROVIDER_SITE_OTHER): Payer: Medicare Other | Admitting: Primary Care

## 2016-05-05 ENCOUNTER — Encounter: Payer: Self-pay | Admitting: Primary Care

## 2016-05-05 ENCOUNTER — Ambulatory Visit (INDEPENDENT_AMBULATORY_CARE_PROVIDER_SITE_OTHER)
Admission: RE | Admit: 2016-05-05 | Discharge: 2016-05-05 | Disposition: A | Discharge status: Home / Self Care | Payer: Medicare Other | Source: Ambulatory Visit | Attending: Primary Care | Admitting: Primary Care

## 2016-05-05 VITALS — BP 126/70 | HR 87 | Temp 97.5°F | Ht 65.0 in | Wt 146.8 lb

## 2016-05-05 DIAGNOSIS — M542 Cervicalgia: Secondary | ICD-10-CM | POA: Diagnosis not present

## 2016-05-05 MED ORDER — METHOCARBAMOL 500 MG PO TABS: 500.0000 mg | ORAL_TABLET | Freq: Four times a day (QID) | ORAL | 0 refills | Status: DC | PRN

## 2016-05-05 NOTE — Progress Notes (Signed)
Subjective:    Patient ID: Chales SalmonJoanne Dampier, female    DOB: 16-Sep-1945, 70 y.o.   MRN: [id number]  HPI  Ms. Bley is a 70 year old female who presents today with a chief complaint of neck pain. She was evaluated on 04/29/16 with a 1-2 week history of lower cervical neck pain that had been present as she was working on [employer] construction projects in her house. She has a history of cervical fusion to C3-5 in the 1980's which had not bothered her since. She was noted to have muscle tenderness and spasms last visit and was treated with Tizanidine, heat, stretching, tylenol/ibuprofen.  Since her last visit her neck has been "drawing to the right", her pain is worse as she describes it as pressure that is located to the lower c-spine and moving to the right. She denies numbness/tingling, cough, sore throat, fevers. She's taken Oxycodone, Tizanidine, and ibuprofen without improvement. Her pain is worse with movement.   Review of Systems  Constitutional: Negative for fever.  HENT: Negative for sore throat.   Respiratory: Negative for cough.   Musculoskeletal: Positive for arthralgias, neck pain and neck stiffness.       Past Medical History:  Diagnosis Date  . Acute cholecystitis   . Cholecystitis with cholelithiasis 10/11/2015  . COPD (chronic obstructive pulmonary disease) (HCC)   . Ex-smoker    quit 2000s     Social History   Social History  . Marital status: Widowed    Spouse name: N/A  . Number of children: N/A  . Years of education: N/A   Occupational History  . Not on file.   Social History Main Topics  . Smoking status: Former Smoker    Packs/day: 1.00    Years: 40.00    Quit date: 06/01/2001  . Smokeless tobacco: Never Used  . Alcohol use No  . Drug use: No  . Sexual activity: Not on file   Other Topics Concern  . Not on file   Social History Narrative   [place]   Widower - husband passed away 2012   Lives alone, 1 chihuahua   GF of Page SpiroKen  Isley   Occ: retired, worked at [employer]   Activity: enjoys going [place] [employer]   Diet: good water, fruits/vegetables daily    Past Surgical History:  Procedure Laterality Date  . ABDOMINAL HYSTERECTOMY  1978   part of an ovary remained  . APPENDECTOMY  1978  . CATARACT EXTRACTION, BILATERAL  2013  . CHOLECYSTECTOMY N/A 10/11/2015   Procedure: LAPAROSCOPIC CHOLECYSTECTOMY;  Surgeon: Leafy Roiego F Pabon, MD;  Location: [facility] ORS;  Service: General;  Laterality: N/A;  . TONSILLECTOMY      Family History  Problem Relation Age of Onset  . Heart attack Father 8472  . Diabetes Father   . Diabetes Mother   . Stroke Neg Hx   . Cancer Neg Hx     No Known Allergies  Current Outpatient Prescriptions on File Prior to Visit  Medication Sig Dispense Refill  . albuterol (PROVENTIL) (2.5 MG/3ML) 0.083% nebulizer solution Take 3 mLs (2.5 mg total) by nebulization every 4 (four) hours as needed. Reported on 10/11/2015 125 mL 2  . Biotin [id number] MCG TABS Take 1 tablet by mouth.    . calcium-vitamin D (OSCAL WITH D) 500-200 MG-UNIT tablet Take 1 tablet by mouth.    Marland Kitchen. HYDROcodone-acetaminophen (NORCO/VICODIN) 5-325 MG tablet Take 1 tablet by mouth every 6 (six) hours as needed for moderate pain. 20  tablet 0  . montelukast (SINGULAIR) 10 MG tablet Take 1 tablet by mouth daily.  3  . Multiple Vitamins-Minerals (MULTIVITAMIN ADULT PO) Take 1 tablet by mouth.    Marland Kitchen. PROAIR HFA 108 (90 Base) MCG/ACT inhaler Inhale 2 puffs into the lungs every 4 (four) hours as needed. 1 Inhaler 3  . Probiotic Product (PROBIOTIC-10 PO) Take 1 tablet by mouth.    . Respiratory Therapy Supplies (FLUTTER) DEVI 1 each by Does not apply route once. 1 each 0  . roflumilast (DALIRESP) 500 MCG TABS tablet Take 1 tablet (500 mcg total) by mouth daily. 30 tablet 4  . tiotropium (SPIRIVA) 18 MCG inhalation capsule Place 1 capsule (18 mcg total) into inhaler and inhale daily. 30 capsule 6  . vitamin B-12 (CYANOCOBALAMIN) 1000 MCG tablet Take  1,000 mcg by mouth daily.     No current facility-administered medications on file prior to visit.     BP 126/70   Pulse 87   Temp 97.5 F (36.4 C) (Oral)   Ht 5\' 5"  (1.651 m)   Wt 146 lb 12.8 oz (66.6 kg)   SpO2 96%   BMI 24.43 kg/m    Objective:   Physical Exam  Constitutional: She appears well-nourished.  Neck: Neck supple. Spinous process tenderness and muscular tenderness present. Decreased range of motion present. No erythema present.  Decreased ROM with left rotation and lateral bending, extension and flexion. Spinal tenderness to C4-5 region.  Cardiovascular: Normal rate and regular rhythm.   Pulmonary/Chest: Effort normal and breath sounds normal.  Musculoskeletal:  Tenderness to right trapezius muscle of neck  Skin: Skin is warm and dry.          Assessment & Plan:  Neck Pain:  Present x 3-4 weeks since working on projects in her home. Pain now worse to right. No improvement with Rx and OTC meds, and conservative treatment. Exam consistent for MSK involvement given lack of ROM with MSK tenderness. Low suspicion for meningitis and cervical trauma. Xray of cervical spine pending. Switch from Tizanidine to Robaxin. Continue NSAIDs, heat, stretching. Urgent referral placed to PT for further evaluation, patient is reticent as this didn't help her in the past. Strongly recommended she move forward with PT. Will await xray results.  Morrie Sheldonlark,Roderic Lammert Kendal, NP

## 2016-05-05 NOTE — Patient Instructions (Addendum)
Stop Tizanidine tablets for muscle spasm. Start methocarbamol tablets for muscle spasms. You may take 1 tablet by mouth every 6 hours as needed.  Continue application of heat.  Stop by the front desk and speak with either Shirlee LimerickMarion or Zella BallRobin regarding your referral to physical therapy.  Complete xray(s) prior to leaving today. I will notify you of your results once received.  It was a pleasure to see you today!

## 2016-05-05 NOTE — Progress Notes (Signed)
Pre visit review using our clinic review tool, if applicable. No additional management support is needed unless otherwise documented below in the visit note. 

## 2016-06-11 ENCOUNTER — Other Ambulatory Visit: Payer: Self-pay | Admitting: Family Medicine

## 2016-06-11 DIAGNOSIS — Z1231 Encounter for screening mammogram for malignant neoplasm of breast: Secondary | ICD-10-CM

## 2016-06-22 ENCOUNTER — Other Ambulatory Visit: Payer: Medicare Other

## 2016-06-22 ENCOUNTER — Ambulatory Visit (INDEPENDENT_AMBULATORY_CARE_PROVIDER_SITE_OTHER): Payer: Medicare Other

## 2016-06-22 ENCOUNTER — Telehealth: Payer: Self-pay | Admitting: Internal Medicine

## 2016-06-22 VITALS — BP 118/72 | HR 77 | Temp 97.9°F | Ht 65.0 in | Wt 143.5 lb

## 2016-06-22 DIAGNOSIS — I700 Atherosclerosis of aorta: Secondary | ICD-10-CM | POA: Diagnosis not present

## 2016-06-22 DIAGNOSIS — Z1159 Encounter for screening for other viral diseases: Secondary | ICD-10-CM | POA: Diagnosis not present

## 2016-06-22 DIAGNOSIS — D649 Anemia, unspecified: Secondary | ICD-10-CM | POA: Diagnosis not present

## 2016-06-22 DIAGNOSIS — R7309 Other abnormal glucose: Secondary | ICD-10-CM

## 2016-06-22 DIAGNOSIS — Z0000 Encounter for general adult medical examination without abnormal findings: Secondary | ICD-10-CM | POA: Diagnosis not present

## 2016-06-22 LAB — COMPREHENSIVE METABOLIC PANEL
ALT: 16 U/L (ref 0–35)
AST: 19 U/L (ref 0–37)
Albumin: 3.8 g/dL (ref 3.5–5.2)
Alkaline Phosphatase: 66 U/L (ref 39–117)
BUN: 15 mg/dL (ref 6–23)
CALCIUM: 9.5 mg/dL (ref 8.4–10.5)
CHLORIDE: 106 meq/L (ref 96–112)
CO2: 29 meq/L (ref 19–32)
CREATININE: 0.97 mg/dL (ref 0.40–1.20)
GFR: 60.08 mL/min (ref >60.00)
Glucose, Bld: 115 mg/dL — ABNORMAL HIGH (ref 70–99)
POTASSIUM: 4.5 meq/L (ref 3.5–5.1)
Sodium: 142 mEq/L (ref 135–145)
Total Bilirubin: 0.6 mg/dL (ref 0.2–1.2)
Total Protein: 6.6 g/dL (ref 6.0–8.3)

## 2016-06-22 LAB — CBC WITH DIFFERENTIAL/PLATELET
Basophils Absolute: 0 10*3/uL (ref 0.0–0.1)
Basophils Relative: 0.6 % (ref 0.0–3.0)
EOS PCT: 7.2 % — AB (ref 0.0–5.0)
Eosinophils Absolute: 0.4 10*3/uL (ref 0.0–0.7)
HCT: 40.7 % (ref 36.0–46.0)
HEMOGLOBIN: 13.9 g/dL (ref 12.0–15.0)
Lymphocytes Relative: 22.6 % (ref 12.0–46.0)
Lymphs Abs: 1.3 10*3/uL (ref 0.7–4.0)
MCHC: 34.1 g/dL (ref 30.0–36.0)
MCV: 89.6 fl (ref 78.0–100.0)
MONOS PCT: 10.3 % (ref 3.0–12.0)
Monocytes Absolute: 0.6 10*3/uL (ref 0.1–1.0)
Neutro Abs: 3.3 10*3/uL (ref 1.4–7.7)
Neutrophils Relative %: 59.3 % (ref 43.0–77.0)
Platelets: 303 10*3/uL (ref 150.0–400.0)
RBC: 4.55 Mil/uL (ref 3.87–5.11)
RDW: 13.2 % (ref 11.5–15.5)
WBC: 5.6 10*3/uL (ref 4.0–10.5)

## 2016-06-22 LAB — LIPID PANEL
Cholesterol: 201 mg/dL — ABNORMAL HIGH (ref 0–200)
HDL: 56.8 mg/dL (ref >39.00)
NONHDL: 144.67
Total CHOL/HDL Ratio: 4
Triglycerides: 217 mg/dL — ABNORMAL HIGH (ref 0.0–149.0)
VLDL: 43.4 mg/dL — ABNORMAL HIGH (ref 0.0–40.0)

## 2016-06-22 LAB — HEMOGLOBIN A1C: Hgb A1c MFr Bld: 6.1 % (ref 4.6–6.5)

## 2016-06-22 LAB — LDL CHOLESTEROL, DIRECT: Direct LDL: 119 mg/dL

## 2016-06-22 MED ORDER — AZITHROMYCIN 250 MG PO TABS: 250.0000 mg | ORAL_TABLET | Freq: Every day | ORAL | 5 refills | Status: DC

## 2016-06-22 NOTE — Progress Notes (Signed)
Pre visit review using our clinic review tool, if applicable. No additional management support is needed unless otherwise documented below in the visit note. 

## 2016-06-22 NOTE — Progress Notes (Signed)
PCP notes:   Health maintenance:  Hep C screening - completed Shingles - postponed/insurance Bone density - per pt, approx. 2 yrs ago Colonoscopy - per pt, approx. 3 yrs ago  Abnormal screenings:   Hearing - failed  Patient concerns:   Pt has concerns with fecal incontinence. Wears pad daily. Onset: post gallbladder removal.  Nurse concerns:  None  Next PCP appt:   06/26/16 @ 0930

## 2016-06-22 NOTE — Telephone Encounter (Signed)
Patient cannot afford new cost for Daliresp.  Please call about switching to a more affordable medication .

## 2016-06-22 NOTE — Progress Notes (Signed)
Subjective:   Stacey Durham is a 71 y.o. female who presents for Medicare Annual (Subsequent) preventive examination.  Review of Systems:  N/A Cardiac Risk Factors include: advanced age (>17men, >2 women);dyslipidemia     Objective:     Vitals: BP 118/72 (BP Location: Right Arm, Patient Position: Sitting, Cuff Size: Normal)   Pulse 77   Temp 97.9 F (36.6 C) (Oral)   Ht 5\' 5"  (1.651 m) Comment: no shoes  Wt 143 lb 8 oz (65.1 kg)   SpO2 97%   BMI 23.88 kg/m   Body mass index is 23.88 kg/m.   Tobacco History  Smoking Status  . Former Smoker  . Packs/day: 1.00  . Years: 40.00  . Quit date: 06/01/2001  Smokeless Tobacco  . Never Used     Counseling given: No   Past Medical History:  Diagnosis Date  . Acute cholecystitis   . Cholecystitis with cholelithiasis 10/11/2015  . COPD (chronic obstructive pulmonary disease) (HCC)   . Ex-smoker    quit 2000s   Past Surgical History:  Procedure Laterality Date  . ABDOMINAL HYSTERECTOMY  1978   part of an ovary remained  . APPENDECTOMY  1978  . CATARACT EXTRACTION, BILATERAL  2013  . CHOLECYSTECTOMY N/A 10/11/2015   Procedure: LAPAROSCOPIC CHOLECYSTECTOMY;  Surgeon: Leafy Ro, MD;  Location: [facility] ORS;  Service: General;  Laterality: N/A;  . TONSILLECTOMY     Family History  Problem Relation Age of Onset  . Heart attack Father 30  . Diabetes Father   . Diabetes Mother   . Stroke Neg Hx   . Cancer Neg Hx    History  Sexual Activity  . Sexual activity: Not on file    Outpatient Encounter Prescriptions as of 06/22/2016  Medication Sig  . albuterol (PROVENTIL) (2.5 MG/3ML) 0.083% nebulizer solution Take 3 mLs (2.5 mg total) by nebulization every 4 (four) hours as needed. Reported on 10/11/2015  . Biotin 16109 MCG TABS Take 1 tablet by mouth.  . calcium-vitamin D (OSCAL WITH D) 500-200 MG-UNIT tablet Take 1 tablet by mouth.  Marland Kitchen HYDROcodone-acetaminophen (NORCO/VICODIN) 5-325 MG tablet Take 1 tablet by mouth  every 6 (six) hours as needed for moderate pain.  . methocarbamol (ROBAXIN) 500 MG tablet Take 1 tablet (500 mg total) by mouth every 6 (six) hours as needed for muscle spasms.  . montelukast (SINGULAIR) 10 MG tablet Take 1 tablet by mouth daily.  . Multiple Vitamins-Minerals (MULTIVITAMIN ADULT PO) Take 1 tablet by mouth.  Marland Kitchen PROAIR HFA 108 (90 Base) MCG/ACT inhaler Inhale 2 puffs into the lungs every 4 (four) hours as needed.  . Probiotic Product (PROBIOTIC-10 PO) Take 1 tablet by mouth.  . Respiratory Therapy Supplies (FLUTTER) DEVI 1 each by Does not apply route once.  . roflumilast (DALIRESP) 500 MCG TABS tablet Take 1 tablet (500 mcg total) by mouth daily.  Marland Kitchen tiotropium (SPIRIVA) 18 MCG inhalation capsule Place 1 capsule (18 mcg total) into inhaler and inhale daily.  . vitamin B-12 (CYANOCOBALAMIN) 1000 MCG tablet Take 1,000 mcg by mouth daily.   No facility-administered encounter medications on file as of 06/22/2016.     Activities of Daily Living In your present state of health, do you have any difficulty performing the following activities: 06/22/2016 10/11/2015  Hearing? N N  Vision? N N  Difficulty concentrating or making decisions? N N  Walking or climbing stairs? N N  Dressing or bathing? N N  Doing errands, shopping? N N  Quarry manager and  eating ? N -  Using the Toilet? N -  In the past six months, have you accidently leaked urine? N -  Do you have problems with loss of bowel control? Y -  Managing your Medications? N -  Managing your Finances? N -  Housekeeping or managing your Housekeeping? N -  Some recent data might be hidden    Patient Care Team: Eustaquio BoydenJavier Gutierrez, MD as PCP - General (Family Medicine) Willis ModenaWilliam Outlaw, MD as Consulting Physician (Gastroenterology) Carrie MewKevin J Ritter, OD as Consulting Physician (Optometry)    Assessment:     Hearing Screening   125Hz  250Hz  500Hz  1000Hz  2000Hz  3000Hz  4000Hz  6000Hz  8000Hz   Right ear:   0 0 40  0    Left ear:   40 0  40  0    Vision Screening Comments: Last vision exam in 2017 with Dr. Lew Dawesitter   Exercise Activities and Dietary recommendations Current Exercise Habits: The patient does not participate in regular exercise at present, Exercise limited by: None identified  Goals    . Increase physical activity          Starting Feb 2018, I will attempt to exercise at gym for at least 30 min at least 2-3 days per week.      Fall Risk Fall Risk  06/22/2016  Falls in the past year? No   Depression Screen PHQ 2/9 Scores 06/22/2016  PHQ - 2 Score 0     Cognitive Function MMSE - Mini Mental State Exam 06/22/2016  Orientation to time 5  Orientation to Place 5  Registration 3  Attention/ Calculation 0  Recall 3  Language- name 2 objects 0  Language- repeat 1  Language- follow 3 step command 3  Language- read & follow direction 0  Write a sentence 0  Copy design 0  Total score 20       PLEASE NOTE: A Mini-Cog screen was completed. Maximum score is 20. A value of 0 denotes this part of Folstein MMSE was not completed or the patient failed this part of the Mini-Cog screening.   Mini-Cog Screening Orientation to Time - Max 5 pts Orientation to Place - Max 5 pts Registration - Max 3 pts Recall - Max 3 pts Language Repeat - Max 1 pts Language Follow 3 Step Command - Max 3 pts   Immunization History  Administered Date(s) Administered  . Influenza Split 01/30/2015  . Influenza,inj,Quad PF,36+ Mos 01/31/2016  . Influenza-Unspecified 06/05/2015  . Pneumococcal Conjugate-13 06/05/2015  . Pneumococcal Polysaccharide-23 03/28/2012  . Tdap 06/23/2012   Screening Tests Health Maintenance  Topic Date Due  . ZOSTAVAX  06/22/2017 (Originally 12/16/2004)  . MAMMOGRAM  07/07/2017  . DTaP/Tdap/Td (2 - Td) 06/23/2022  . TETANUS/TDAP  06/23/2022  . COLONOSCOPY  06/02/2023  . INFLUENZA VACCINE  Completed  . DEXA SCAN  Addressed  . Hepatitis C Screening  Completed  . PNA vac Low Risk Adult  Completed        Plan:     I have personally reviewed and addressed the Medicare Annual Wellness questionnaire and have noted the following in the patient's chart:  A. Medical and social history B. Use of alcohol, tobacco or illicit drugs  C. Current medications and supplements D. Functional ability and status E.  Nutritional status F.  Physical activity G. Advance directives H. List of other physicians I.  Hospitalizations, surgeries, and ER visits in previous 12 months J.  Vitals K. Screenings to include hearing, vision, cognitive, depression L. Referrals  and appointments - none  In addition, I have reviewed and discussed with patient certain preventive protocols, quality metrics, and best practice recommendations. A written personalized care plan for preventive services as well as general preventive health recommendations were provided to patient.  See attached scanned questionnaire for additional information.   Signed,   Lindell Noe, MHA, BS, LPN Health Coach

## 2016-06-22 NOTE — Telephone Encounter (Signed)
Azithromycin 250 mg daily 

## 2016-06-22 NOTE — Telephone Encounter (Signed)
Pt states she can't afford the Daliresp any longer and wants to know if there is something cheaper to take in its place.

## 2016-06-22 NOTE — Patient Instructions (Addendum)
[name] , Thank you for taking time to come for your Medicare Wellness Visit. I appreciate your ongoing commitment to your health goals. Please review the following plan we discussed and let me know if I can assist you in the future.   These are the goals we discussed: Goals    . Increase physical activity          Starting Feb 2018, I will attempt to exercise at gym for at least 30 min at least 2-3 days per week.       This is a list of the screening recommended for you and due dates:  Health Maintenance  Topic Date Due  . Shingles Vaccine  06/22/2017*  . Mammogram  07/07/2017  . DTaP/Tdap/Td vaccine (2 - Td) 06/23/2022  . Tetanus Vaccine  06/23/2022  . Colon Cancer Screening  06/02/2023  . Flu Shot  Completed  . DEXA scan (bone density measurement)  Addressed  .  Hepatitis C: One time screening is recommended by Center for Disease Control  (CDC) for  adults born from [birth year] through 1965.   Completed  . Pneumonia vaccines  Completed  *Topic was postponed. The date shown is not the original due date.   Preventive Care for Adults  A healthy lifestyle and preventive care can promote health and wellness. Preventive health guidelines for adults include the following key practices.  . A routine yearly physical is a good way to check with your health care provider about your health and preventive screening. It is a chance to share any concerns and updates on your health and to receive a thorough exam.  . Visit your dentist for a routine exam and preventive care every 6 months. Brush your teeth twice a day and floss once a day. Good oral hygiene prevents tooth decay and gum disease.  . The frequency of eye exams is based on your age, health, family medical history, use  of contact lenses, and other factors. Follow your health care provider's ecommendations for frequency of eye exams.  . Eat a healthy diet. Foods like vegetables, fruits, whole grains, low-fat dairy products, and  lean protein foods contain the nutrients you need without too many calories. Decrease your intake of foods high in solid fats, added sugars, and salt. Eat the right amount of calories for you. Get information about a proper diet from your health care provider, if necessary.  . Regular physical exercise is one of the most important things you can do for your health. Most adults should get at least 150 minutes of moderate-intensity exercise (any activity that increases your heart rate and causes you to sweat) each week. In addition, most adults need muscle-strengthening exercises on 2 or more days a week.  Silver Sneakers may be a benefit available to you. To determine eligibility, you may visit the website: www.silversneakers.com or contact program at [phone] Mon-Fri between 8AM-8PM.   . Maintain a healthy weight. The body mass index (BMI) is a screening tool to identify possible weight problems. It provides an estimate of body fat based on height and weight. Your health care provider can find your BMI and can help you achieve or maintain a healthy weight.   For adults 20 years and older: ? A BMI below 18.5 is considered underweight. ? A BMI of 18.5 to 24.9 is normal. ? A BMI of 25 to 29.9 is considered overweight. ? A BMI of 30 and above is considered obese.   . Maintain normal blood lipids and  cholesterol levels by exercising and minimizing your intake of saturated fat. Eat a balanced diet with plenty of fruit and vegetables. Blood tests for lipids and cholesterol should begin at age 70 and be repeated every 5 years. If your lipid or cholesterol levels are high, you are over 50, or you are at high risk for heart disease, you may need your cholesterol levels checked more frequently. Ongoing high lipid and cholesterol levels should be treated with medicines if diet and exercise are not working.  . If you smoke, find out from your health care provider how to quit. If you do not use tobacco,  please do not start.  . If you choose to drink alcohol, please do not consume more than 2 drinks per day. One drink is considered to be 12 ounces (355 mL) of beer, 5 ounces (148 mL) of wine, or 1.5 ounces (44 mL) of liquor.  . If you are 13-21 years old, ask your health care provider if you should take aspirin to prevent strokes.  . Use sunscreen. Apply sunscreen liberally and repeatedly throughout the day. You should seek shade when your shadow is shorter than you. Protect yourself by wearing long sleeves, pants, a wide-brimmed hat, and sunglasses year round, whenever you are outdoors.  . Once a month, do a whole body skin exam, using a mirror to look at the skin on your back. Tell your health care provider of new moles, moles that have irregular borders, moles that are larger than a pencil eraser, or moles that have changed in shape or color.

## 2016-06-22 NOTE — Progress Notes (Signed)
I reviewed health advisor's note, was available for consultation, and agree with documentation and plan.  

## 2016-06-23 LAB — HEPATITIS C ANTIBODY: HCV Ab: NEGATIVE

## 2016-06-26 ENCOUNTER — Encounter: Payer: Self-pay | Admitting: Family Medicine

## 2016-06-26 ENCOUNTER — Ambulatory Visit (INDEPENDENT_AMBULATORY_CARE_PROVIDER_SITE_OTHER): Payer: Medicare Other | Admitting: Family Medicine

## 2016-06-26 VITALS — BP 124/76 | HR 84 | Temp 97.7°F | Ht 65.0 in | Wt 143.0 lb

## 2016-06-26 DIAGNOSIS — E785 Hyperlipidemia, unspecified: Secondary | ICD-10-CM | POA: Insufficient documentation

## 2016-06-26 DIAGNOSIS — J432 Centrilobular emphysema: Secondary | ICD-10-CM

## 2016-06-26 DIAGNOSIS — I700 Atherosclerosis of aorta: Secondary | ICD-10-CM

## 2016-06-26 DIAGNOSIS — R7303 Prediabetes: Secondary | ICD-10-CM | POA: Insufficient documentation

## 2016-06-26 DIAGNOSIS — Z87891 Personal history of nicotine dependence: Secondary | ICD-10-CM | POA: Diagnosis not present

## 2016-06-26 DIAGNOSIS — R159 Full incontinence of feces: Secondary | ICD-10-CM | POA: Diagnosis not present

## 2016-06-26 DIAGNOSIS — Z0000 Encounter for general adult medical examination without abnormal findings: Secondary | ICD-10-CM | POA: Insufficient documentation

## 2016-06-26 DIAGNOSIS — Z7189 Other specified counseling: Secondary | ICD-10-CM | POA: Insufficient documentation

## 2016-06-26 DIAGNOSIS — R152 Fecal urgency: Secondary | ICD-10-CM | POA: Insufficient documentation

## 2016-06-26 DIAGNOSIS — Z0001 Encounter for general adult medical examination with abnormal findings: Secondary | ICD-10-CM | POA: Insufficient documentation

## 2016-06-26 MED ORDER — FISH OIL 1000 MG PO CAPS: 1.0000 | ORAL_CAPSULE | Freq: Every day | ORAL | 0 refills | Status: DC

## 2016-06-26 NOTE — Patient Instructions (Addendum)
We will refer you to Dr Paulita Fujita for further evaluation. [phone] Call your insurance about the shingles shot (zostavax or shingrix) to see if it is covered or how much it would cost and where is cheaper (here or pharmacy). If you want to receive here, call for nurse visit.  Check on advanced directive at home, bring [place] a copy of you do have this.  Work on less simple carbs and consider fish oil tablet daily.  Return as needed or in yr for physical.  Health Maintenance, Female Introduction Adopting a healthy lifestyle and getting preventive care can go a long way to promote health and wellness. Talk with your health care provider about what schedule of regular examinations is right for you. This is a good chance for you to check in with your provider about disease prevention and staying healthy. In between checkups, there are plenty of things you can do on your own. Experts have done a lot of research about which lifestyle changes and preventive measures are most likely to keep you healthy. Ask your health care provider for more information. Weight and diet Eat a healthy diet  Be sure to include plenty of vegetables, fruits, low-fat dairy products, and lean protein.  Do not eat a lot of foods high in solid fats, added sugars, or salt.  Get regular exercise. This is one of the most important things you can do for your health.  Most adults should exercise for at least 150 minutes each week. The exercise should increase your heart rate and make you sweat (moderate-intensity exercise).  Most adults should also do strengthening exercises at least twice a week. This is in addition to the moderate-intensity exercise. Maintain a healthy weight  Body mass index (BMI) is a measurement that can be used to identify possible weight problems. It estimates body fat based on height and weight. Your health care provider can help determine your BMI and help you achieve or maintain a healthy weight.  For  females 34 years of age and older:  A BMI below 18.5 is considered underweight.  A BMI of 18.5 to 24.9 is normal.  A BMI of 25 to 29.9 is considered overweight.  A BMI of 30 and above is considered obese. Watch levels of cholesterol and blood lipids  You should start having your blood tested for lipids and cholesterol at 72 years of age, then have this test every 5 years.  You may need to have your cholesterol levels checked more often if:  Your lipid or cholesterol levels are high.  You are older than 72 years of age.  You are at high risk for heart disease. Cancer screening Lung Cancer  Lung cancer screening is recommended for adults 110-24 years old who are at high risk for lung cancer because of a history of smoking.  A yearly low-dose CT scan of the lungs is recommended for people who:  Currently smoke.  Have quit within the past 15 years.  Have at least a 30-pack-year history of smoking. A pack year is smoking an average of one pack of cigarettes a day for 1 year.  Yearly screening should continue until it has been 15 years since you quit.  Yearly screening should stop if you develop a health problem that would prevent you from having lung cancer treatment. Breast Cancer  Practice breast self-awareness. This means understanding how your breasts normally appear and feel.  It also means doing regular breast self-exams. Let your health care provider know  about any changes, no matter how small.  If you are in your 20s or 30s, you should have a clinical breast exam (CBE) by a health care provider every 1-3 years as part of a regular health exam.  If you are 16 or older, have a CBE every year. Also consider having a breast X-ray (mammogram) every year.  If you have a family history of breast cancer, talk to your health care provider about genetic screening.  If you are at high risk for breast cancer, talk to your health care provider about having an MRI and a mammogram  every year.  Breast cancer gene (BRCA) assessment is recommended for women who have family members with BRCA-related cancers. BRCA-related cancers include:  Breast.  Ovarian.  Tubal.  Peritoneal cancers.  Results of the assessment will determine the need for genetic counseling and BRCA1 and BRCA2 testing. Cervical Cancer  Your health care provider may recommend that you be screened regularly for cancer of the pelvic organs (ovaries, uterus, and vagina). This screening involves a pelvic examination, including checking for microscopic changes to the surface of your cervix (Pap test). You may be encouraged to have this screening done every 3 years, beginning at age 85.  For women ages 64-65, health care providers may recommend pelvic exams and Pap testing every 3 years, or they may recommend the Pap and pelvic exam, combined with testing for human papilloma virus (HPV), every 5 years. Some types of HPV increase your risk of cervical cancer. Testing for HPV may also be done on women of any age with unclear Pap test results.  Other health care providers may not recommend any screening for nonpregnant women who are considered low risk for pelvic cancer and who do not have symptoms. Ask your health care provider if a screening pelvic exam is right for you.  If you have had past treatment for cervical cancer or a condition that could lead to cancer, you need Pap tests and screening for cancer for at least 20 years after your treatment. If Pap tests have been discontinued, your risk factors (such as having a new sexual partner) need to be reassessed to determine if screening should resume. Some women have medical problems that increase the chance of getting cervical cancer. In these cases, your health care provider may recommend more frequent screening and Pap tests. Colorectal Cancer  This type of cancer can be detected and often prevented.  Routine colorectal cancer screening usually begins at 72  years of age and continues through 72 years of age.  Your health care provider may recommend screening at an earlier age if you have risk factors for colon cancer.  Your health care provider may also recommend using home test kits to check for hidden blood in the stool.  A small camera at the end of a tube can be used to examine your colon directly (sigmoidoscopy or colonoscopy). This is done to check for the earliest forms of colorectal cancer.  Routine screening usually begins at age 92.  Direct examination of the colon should be repeated every 5-10 years through 72 years of age. However, you may need to be screened more often if early forms of precancerous polyps or small growths are found. Skin Cancer  Check your skin from head to toe regularly.  Tell your health care provider about any new moles or changes in moles, especially if there is a change in a mole's shape or color.  Also tell your health care provider if  you have a mole that is larger than the size of a pencil eraser.  Always use sunscreen. Apply sunscreen liberally and repeatedly throughout the day.  Protect yourself by wearing long sleeves, pants, a wide-brimmed hat, and sunglasses whenever you are outside. Heart disease, diabetes, and high blood pressure  High blood pressure causes heart disease and increases the risk of stroke. High blood pressure is more likely to develop in:  People who have blood pressure in the high end of the normal range (130-139/85-89 mm Hg).  People who are overweight or obese.  People who are African American.  If you are 21-67 years of age, have your blood pressure checked every 3-5 years. If you are 42 years of age or older, have your blood pressure checked every year. You should have your blood pressure measured twice-once when you are at a hospital or clinic, and once when you are not at a hospital or clinic. Record the average of the two measurements. To check your blood pressure when  you are not at a hospital or clinic, you can use:  An automated blood pressure machine at a pharmacy.  A home blood pressure monitor.  If you are between 64 years and 31 years old, ask your health care provider if you should take aspirin to prevent strokes.  Have regular diabetes screenings. This involves taking a blood sample to check your fasting blood sugar level.  If you are at a normal weight and have a low risk for diabetes, have this test once every three years after 72 years of age.  If you are overweight and have a high risk for diabetes, consider being tested at a younger age or more often. Preventing infection Hepatitis B  If you have a higher risk for hepatitis B, you should be screened for this virus. You are considered at high risk for hepatitis B if:  You were born in a country where hepatitis B is common. Ask your health care provider which countries are considered high risk.  Your parents were born in a high-risk country, and you have not been immunized against hepatitis B (hepatitis B vaccine).  You have HIV or AIDS.  You use needles to inject street drugs.  You live with someone who has hepatitis B.  You have had sex with someone who has hepatitis B.  You get hemodialysis treatment.  You take certain medicines for conditions, including cancer, organ transplantation, and autoimmune conditions. Hepatitis C  Blood testing is recommended for:  Everyone born from [birth year] through 1966.  Anyone with known risk factors for hepatitis C. Sexually transmitted infections (STIs)  You should be screened for sexually transmitted infections (STIs) including gonorrhea and chlamydia if:  You are sexually active and are younger than 72 years of age.  You are older than 72 years of age and your health care provider tells you that you are at risk for this type of infection.  Your sexual activity has changed since you were last screened and you are at an increased risk for  chlamydia or gonorrhea. Ask your health care provider if you are at risk.  If you do not have HIV, but are at risk, it may be recommended that you take a prescription medicine daily to prevent HIV infection. This is called pre-exposure prophylaxis (PrEP). You are considered at risk if:  You are sexually active and do not regularly use condoms or know the HIV status of your partner(s).  You take drugs by injection.  You  are sexually active with a partner who has HIV. Talk with your health care provider about whether you are at high risk of being infected with HIV. If you choose to begin PrEP, you should first be tested for HIV. You should then be tested every 3 months for as long as you are taking PrEP. Pregnancy  If you are premenopausal and you may become pregnant, ask your health care provider about preconception counseling.  If you may become pregnant, take 400 to 800 micrograms (mcg) of folic acid every day.  If you want to prevent pregnancy, talk to your health care provider about birth control (contraception). Osteoporosis and menopause  Osteoporosis is a disease in which the bones lose minerals and strength with aging. This can result in serious bone fractures. Your risk for osteoporosis can be identified using a bone density scan.  If you are 42 years of age or older, or if you are at risk for osteoporosis and fractures, ask your health care provider if you should be screened.  Ask your health care provider whether you should take a calcium or vitamin D supplement to lower your risk for osteoporosis.  Menopause may have certain physical symptoms and risks.  Hormone replacement therapy may reduce some of these symptoms and risks. Talk to your health care provider about whether hormone replacement therapy is right for you. Follow these instructions at home:  Schedule regular health, dental, and eye exams.  Stay current with your immunizations.  Do not use any tobacco products  including cigarettes, chewing tobacco, or electronic cigarettes.  If you are pregnant, do not drink alcohol.  If you are breastfeeding, limit how much and how often you drink alcohol.  Limit alcohol intake to no more than 1 drink per day for nonpregnant women. One drink equals 12 ounces of beer, 5 ounces of wine, or 1 ounces of hard liquor.  Do not use street drugs.  Do not share needles.  Ask your health care provider for help if you need support or information about quitting drugs.  Tell your health care provider if you often feel depressed.  Tell your health care provider if you have ever been abused or do not feel safe at home. This information is not intended to replace advice given to you by your health care provider. Make sure you discuss any questions you have with your health care provider. Document Released: 12/01/2010 Document Revised: 10/24/2015 Document Reviewed: 02/19/2015  2017 [employer]

## 2016-06-26 NOTE — Assessment & Plan Note (Signed)
Appreciate pulm care of patient.  

## 2016-06-26 NOTE — Assessment & Plan Note (Signed)
Discussed avoiding added sugar /simple carbs in diet.

## 2016-06-26 NOTE — Assessment & Plan Note (Signed)
Reviewed chol levels with patient.  rec start fish oil tablet. ASCVD 10 yr risk = 9.9% - will discuss statin next visit.

## 2016-06-26 NOTE — Assessment & Plan Note (Addendum)
Not on aspirin or statin. 

## 2016-06-26 NOTE — Progress Notes (Signed)
BP 124/76   Pulse 84   Temp 97.7 F (36.5 C) (Oral)   Ht 5\' 5"  (1.651 m)   Wt 143 lb (64.9 kg)   BMI 23.80 kg/m    CC: CPE Subjective:    Patient ID: Stacey Durham, female    DOB: [birth year], 71 y.o.   MRN: [id number]  HPI: Stacey Durham is a 71 y.o. female presenting on 06/26/2016 for Annual Exam   Saw Virl Axe this week for medicare wellness visit. Note reviewed.    Loose and watery stools with occasional fecal incontinence since cholecystectomy late 10-21-15. Miralax did not hep. We have not talked about this in the past. Fecal accidents more likely with heavy lifting. Wears pad. No unexpected weight loss.   COPD - followed by Dr Adriana Reams. Could not afford daliresp, started on azithromycin 250mg  daily. Continues spiriva and singulair daily, albuterol PRN.   Preventative: Colonoscopy Oct 20, 2012 with 5 polyps (Outlaw) - to call and f/u with [facility] (outlaw)  Lung cancer screening - recent chest CT 12/2015 reviewed. Mod centrilobular emphysema, infectious pneumonitis.  Breast cancer screening - mammo WNL 07/2015 Well woman exam - s/p hysterectomy 1978, part of an ovary remains.  DEXA scan - normal per patient 06/2014 Flu shot - yearly Tdap Oct 20, 2012 Pneumovax 03/2012, prevnar 06/2015 Shingles shot - will check with insurance Advanced directive discussion - packet provided earlier this week. She thinks hse has had this completed by attorney - will check at home. HCPOA - unsure.  Seat belt use discussed Sunscreen use discussed. No changing moles on skin. Ex smoker quit Oct 20, 2001 Alcohol - none  [place] - husband passed away 10/21/10 Lives alone, 1 chihuahua GF of [name] Occ: retired, worked at [employer]: enjoys going to [employer]: good water, fruits/vegetables daily  Relevant past medical, surgical, family and social history reviewed and updated as indicated. Interim medical history since our last visit reviewed. Allergies and medications  reviewed and updated. Current Outpatient Prescriptions on File Prior to Visit  Medication Sig  . albuterol (PROVENTIL) (2.5 MG/3ML) 0.083% nebulizer solution Take 3 mLs (2.5 mg total) by nebulization every 4 (four) hours as needed. Reported on 10/11/2015  . azithromycin (ZITHROMAX) 250 MG tablet Take 1 tablet (250 mg total) by mouth daily.  . Biotin 40981 MCG TABS Take 1 tablet by mouth.  . calcium-vitamin D (OSCAL WITH D) 500-200 MG-UNIT tablet Take 1 tablet by mouth.  . montelukast (SINGULAIR) 10 MG tablet Take 1 tablet by mouth daily.  . Multiple Vitamins-Minerals (MULTIVITAMIN ADULT PO) Take 1 tablet by mouth.  Marland Kitchen PROAIR HFA 108 (90 Base) MCG/ACT inhaler Inhale 2 puffs into the lungs every 4 (four) hours as needed.  . Probiotic Product (PROBIOTIC-10 PO) Take 1 tablet by mouth.  . Respiratory Therapy Supplies (FLUTTER) DEVI 1 each by Does not apply route once.  . tiotropium (SPIRIVA) 18 MCG inhalation capsule Place 1 capsule (18 mcg total) into inhaler and inhale daily.  . vitamin B-12 (CYANOCOBALAMIN) 1000 MCG tablet Take 1,000 mcg by mouth daily.   No current facility-administered medications on file prior to visit.     Review of Systems  Constitutional: Negative for activity change, appetite change, chills, fatigue, fever and unexpected weight change.  HENT: Negative for hearing loss.   Eyes: Negative for visual disturbance.  Respiratory: Negative for cough, chest tightness, shortness of breath and wheezing.   Cardiovascular: Negative for chest pain, palpitations and leg swelling.  Gastrointestinal: Positive for diarrhea. Negative for abdominal distention,  abdominal pain, blood in stool, constipation, nausea and vomiting.  Genitourinary: Negative for difficulty urinating and hematuria.  Musculoskeletal: Negative for arthralgias, myalgias and neck pain.  Skin: Negative for rash.  Neurological: Negative for dizziness, seizures, syncope and headaches.  Hematological: Negative for  adenopathy. Does not bruise/bleed easily.  Psychiatric/Behavioral: Negative for dysphoric mood. The patient is not nervous/anxious.    Per HPI unless specifically indicated in ROS section     Objective:    BP 124/76   Pulse 84   Temp 97.7 F (36.5 C) (Oral)   Ht 5\' 5"  (1.651 m)   Wt 143 lb (64.9 kg)   BMI 23.80 kg/m   Wt Readings from Last 3 Encounters:  06/26/16 143 lb (64.9 kg)  06/22/16 143 lb 8 oz (65.1 kg)  05/05/16 146 lb 12.8 oz (66.6 kg)    Physical Exam  Constitutional: She is oriented to person, place, and time. She appears well-developed and well-nourished. No distress.  HENT:  Head: Normocephalic and atraumatic.  Right Ear: Hearing, tympanic membrane, external ear and ear canal normal.  Left Ear: Hearing, tympanic membrane, external ear and ear canal normal.  Nose: Nose normal.  Mouth/Throat: Uvula is midline, oropharynx is clear and moist and mucous membranes are normal. No oropharyngeal exudate, posterior oropharyngeal edema or posterior oropharyngeal erythema.  Eyes: Conjunctivae and EOM are normal. Pupils are equal, round, and reactive to light. No scleral icterus.  Neck: Normal range of motion. Neck supple. Carotid bruit is not present. No thyromegaly present.  Cardiovascular: Normal rate, regular rhythm, normal heart sounds and intact distal pulses.   No murmur heard. Pulses:      Radial pulses are 2+ on the right side, and 2+ on the left side.  Pulmonary/Chest: Effort normal and breath sounds normal. No respiratory distress. She has no wheezes. She has no rales.  Abdominal: Soft. Bowel sounds are normal. She exhibits no distension and no mass. There is no tenderness. There is no rebound and no guarding.  Musculoskeletal: Normal range of motion. She exhibits no edema.  Lymphadenopathy:    She has no cervical adenopathy.  Neurological: She is alert and oriented to person, place, and time.  CN grossly intact, station and gait intact  Skin: Skin is warm and  dry. No rash noted.  Psychiatric: She has a normal mood and affect. Her behavior is normal. Judgment and thought content normal.  Nursing note and vitals reviewed.  Results for orders placed or performed in visit on 06/22/16  Hepatitis C antibody  Result Value Ref Range   HCV Ab NEGATIVE NEGATIVE  Lipid Panel  Result Value Ref Range   Cholesterol 201 (H) 0 - 200 mg/dL   Triglycerides 161.0217.0 (H) 0.0 - 149.0 mg/dL   HDL 96.0456.80 >54.09>39.00 mg/dL   VLDL 81.143.4 (H) 0.0 - 91.440.0 mg/dL   Total CHOL/HDL Ratio 4    NonHDL 144.67   CBC with Differential/Platelet  Result Value Ref Range   WBC 5.6 4.0 - 10.5 K/uL   RBC 4.55 3.87 - 5.11 Mil/uL   Hemoglobin 13.9 12.0 - 15.0 g/dL   HCT 78.240.7 95.636.0 - 21.346.0 %   MCV 89.6 78.0 - 100.0 fl   MCHC 34.1 30.0 - 36.0 g/dL   RDW 08.613.2 57.811.5 - 46.915.5 %   Platelets 303.0 150.0 - 400.0 K/uL   Neutrophils Relative % 59.3 43.0 - 77.0 %   Lymphocytes Relative 22.6 12.0 - 46.0 %   Monocytes Relative 10.3 3.0 - 12.0 %   Eosinophils Relative  7.2 (H) 0.0 - 5.0 %   Basophils Relative 0.6 0.0 - 3.0 %   Neutro Abs 3.3 1.4 - 7.7 K/uL   Lymphs Abs 1.3 0.7 - 4.0 K/uL   Monocytes Absolute 0.6 0.1 - 1.0 K/uL   Eosinophils Absolute 0.4 0.0 - 0.7 K/uL   Basophils Absolute 0.0 0.0 - 0.1 K/uL  Hemoglobin A1c  Result Value Ref Range   Hgb A1c MFr Bld 6.1 4.6 - 6.5 %  Comprehensive metabolic panel  Result Value Ref Range   Sodium 142 135 - 145 mEq/L   Potassium 4.5 3.5 - 5.1 mEq/L   Chloride 106 96 - 112 mEq/L   CO2 29 19 - 32 mEq/L   Glucose, Bld 115 (H) 70 - 99 mg/dL   BUN 15 6 - 23 mg/dL   Creatinine, Ser 0.98 0.40 - 1.20 mg/dL   Total Bilirubin 0.6 0.2 - 1.2 mg/dL   Alkaline Phosphatase 66 39 - 117 U/L   AST 19 0 - 37 U/L   ALT 16 0 - 35 U/L   Total Protein 6.6 6.0 - 8.3 g/dL   Albumin 3.8 3.5 - 5.2 g/dL   Calcium 9.5 8.4 - 11.9 mg/dL   GFR 14.78 >29.56 mL/min  LDL cholesterol, direct  Result Value Ref Range   Direct LDL 119.0 mg/dL   Lab Results  Component Value Date     TSH 3.44 01/07/2016       Assessment & Plan:   Problem List Items Addressed This Visit    Advanced care planning/counseling discussion    Advanced directive discussion - packet provided earlier this week. She thinks hse has had this completed by attorney - will check at home. HCPOA - unsure.       Aortic atherosclerosis (HCC)    Not on aspirin or statin      Chronic obstructive pulmonary disease (HCC)    Appreciate pulm care of patient.       Ex-smoker    Remains abstinent.       Health maintenance examination - Primary    Preventative protocols reviewed and updated unless pt declined. Discussed healthy diet and lifestyle.       HLD (hyperlipidemia)    Reviewed chol levels with patient.  rec start fish oil tablet. ASCVD 10 yr risk = 9.9% - will discuss statin next visit.       Incontinence of feces with fecal urgency    New over last several months since cholecystectomy. With fecal incontinence endorsed, suggested return to GI for further evaluation. Pt agrees with plan. No red flags today.       Relevant Orders   Ambulatory referral to Gastroenterology   Prediabetes    Discussed avoiding added sugar /simple carbs in diet.          Follow up plan: Return in about 1 year (around 06/26/2017) for annual exam, prior fasting for blood work.  Eustaquio Boyden, MD

## 2016-06-26 NOTE — Assessment & Plan Note (Signed)
Preventative protocols reviewed and updated unless pt declined. Discussed healthy diet and lifestyle.  

## 2016-06-26 NOTE — Progress Notes (Signed)
Pre visit review using our clinic review tool, if applicable. No additional management support is needed unless otherwise documented below in the visit note. 

## 2016-06-26 NOTE — Assessment & Plan Note (Signed)
Advanced directive discussion - packet provided earlier this week. She thinks hse has had this completed by attorney - will check at home. HCPOA - unsure.

## 2016-06-26 NOTE — Assessment & Plan Note (Signed)
New over last several months since cholecystectomy. With fecal incontinence endorsed, suggested return to GI for further evaluation. Pt agrees with plan. No red flags today.

## 2016-06-26 NOTE — Assessment & Plan Note (Signed)
Remains abstinent 

## 2016-07-02 ENCOUNTER — Other Ambulatory Visit: Payer: Self-pay | Admitting: *Deleted

## 2016-07-02 MED ORDER — TIOTROPIUM BROMIDE MONOHYDRATE 18 MCG IN CAPS: 18.0000 ug | ORAL_CAPSULE | Freq: Every day | RESPIRATORY_TRACT | 6 refills | Status: DC

## 2016-07-03 DIAGNOSIS — J449 Chronic obstructive pulmonary disease, unspecified: Secondary | ICD-10-CM | POA: Diagnosis not present

## 2016-07-09 ENCOUNTER — Ambulatory Visit: Payer: Medicare Other

## 2016-07-14 ENCOUNTER — Ambulatory Visit
Admission: RE | Admit: 2016-07-14 | Discharge: 2016-07-14 | Disposition: A | Discharge status: Home / Self Care | Payer: Medicare Other | Source: Ambulatory Visit | Attending: Family Medicine | Admitting: Family Medicine

## 2016-07-14 DIAGNOSIS — Z1231 Encounter for screening mammogram for malignant neoplasm of breast: Secondary | ICD-10-CM | POA: Diagnosis not present

## 2016-07-15 ENCOUNTER — Encounter: Payer: Self-pay | Admitting: *Deleted

## 2016-07-15 LAB — HM MAMMOGRAPHY

## 2016-07-30 HISTORY — PX: COLONOSCOPY: SHX174

## 2016-07-31 DIAGNOSIS — J449 Chronic obstructive pulmonary disease, unspecified: Secondary | ICD-10-CM | POA: Diagnosis not present

## 2016-08-12 DIAGNOSIS — Z86010 Personal history of colonic polyps: Secondary | ICD-10-CM | POA: Diagnosis not present

## 2016-08-12 DIAGNOSIS — K644 Residual hemorrhoidal skin tags: Secondary | ICD-10-CM | POA: Diagnosis not present

## 2016-08-12 DIAGNOSIS — K635 Polyp of colon: Secondary | ICD-10-CM | POA: Diagnosis not present

## 2016-08-12 DIAGNOSIS — K5730 Diverticulosis of large intestine without perforation or abscess without bleeding: Secondary | ICD-10-CM | POA: Diagnosis not present

## 2016-08-12 LAB — HM COLONOSCOPY

## 2016-08-14 DIAGNOSIS — R197 Diarrhea, unspecified: Secondary | ICD-10-CM | POA: Diagnosis not present

## 2016-08-14 DIAGNOSIS — Z86010 Personal history of colonic polyps: Secondary | ICD-10-CM | POA: Diagnosis not present

## 2016-08-17 ENCOUNTER — Encounter: Payer: Self-pay | Admitting: Internal Medicine

## 2016-08-17 ENCOUNTER — Ambulatory Visit (INDEPENDENT_AMBULATORY_CARE_PROVIDER_SITE_OTHER): Payer: Medicare Other | Admitting: Internal Medicine

## 2016-08-17 VITALS — BP 126/72 | HR 79 | Ht 64.0 in | Wt 149.8 lb

## 2016-08-17 DIAGNOSIS — J449 Chronic obstructive pulmonary disease, unspecified: Secondary | ICD-10-CM | POA: Diagnosis not present

## 2016-08-17 NOTE — Progress Notes (Signed)
[facility] [facility] Consultation     Date: 08/17/2016,   MRN# [id number] Stacey SalmonJoanne Durham June 07, 1945 Code Status:  Code Status History    Date Active Date Inactive Code Status Order ID Comments User Context   08/16/2015 11:33 AM 08/21/2015  7:28 PM Full Code [id number]  Adrian SaranSital Mody, MD Inpatient          AdmissionWeight: 149 lb 12.8 oz (67.9 kg)                 CurrentWeight: 149 lb 12.8 oz (67.9 kg)     CHIEF COMPLAINT:   Follow up COPD   HISTORY OF PRESENT ILLNESS  No acute SOB, chronic DOE Doing well on spiriva Has not used albuterol in the past month Breo and Incruse does not seem to help her     no signs of infection at this time  Current Medication:   Current Outpatient Prescriptions:  .  albuterol (PROVENTIL) (2.5 MG/3ML) 0.083% nebulizer solution, Take 3 mLs (2.5 mg total) by nebulization every 4 (four) hours as needed. Reported on 10/11/2015, Disp: 125 mL, Rfl: 2 .  Biotin 8469610000 MCG TABS, Take 1 tablet by mouth., Disp: , Rfl:  .  calcium-vitamin D (OSCAL WITH D) 500-200 MG-UNIT tablet, Take 1 tablet by mouth., Disp: , Rfl:  .  montelukast (SINGULAIR) 10 MG tablet, Take 1 tablet by mouth daily., Disp: , Rfl: 3 .  Multiple Vitamins-Minerals (MULTIVITAMIN ADULT PO), Take 1 tablet by mouth., Disp: , Rfl:  .  Omega-3 Fatty Acids (FISH OIL) 1000 MG CAPS, Take 1 capsule (1,000 mg total) by mouth daily., Disp: , Rfl: 0 .  PROAIR HFA 108 (90 Base) MCG/ACT inhaler, Inhale 2 puffs into the lungs every 4 (four) hours as needed., Disp: 1 Inhaler, Rfl: 3 .  Probiotic Product (PROBIOTIC-10 PO), Take 1 tablet by mouth., Disp: , Rfl:  .  Respiratory Therapy Supplies (FLUTTER) DEVI, 1 each by Does not apply route once., Disp: 1 each, Rfl: 0 .  tiotropium (SPIRIVA) 18 MCG inhalation capsule, Place 1 capsule (18 mcg total) into inhaler and inhale daily., Disp: 30 capsule, Rfl: 6 .  vitamin B-12 (CYANOCOBALAMIN) 1000 MCG tablet, Take 1,000 mcg by mouth daily., Disp: , Rfl:       ALLERGIES   Patient has no known allergies.     REVIEW OF SYSTEMS   Review of Systems  Constitutional: Negative for chills, fever, malaise/fatigue and weight loss.  HENT: Negative for congestion.   Respiratory: Positive for shortness of breath. Negative for cough, hemoptysis, sputum production and wheezing.        Chronic SOB/DOE at baseline  Cardiovascular: Negative for chest pain, palpitations, orthopnea and leg swelling.  Gastrointestinal: Negative for heartburn, nausea and vomiting.  All other systems reviewed and are negative.    VS: BP 126/72 (BP Location: Left Arm, Cuff Size: Normal)   Pulse 79   Ht 5\' 4"  (1.626 m)   Wt 149 lb 12.8 oz (67.9 kg)   SpO2 97%   BMI 25.71 kg/m      PHYSICAL EXAM   Physical Exam  Constitutional: She is oriented to person, place, and time. She appears well-developed and well-nourished.  HENT:  Head: Normocephalic and atraumatic.  Mouth/Throat: No oropharyngeal exudate.  Eyes: EOM are normal. Pupils are equal, round, and reactive to light. No scleral icterus.  Cardiovascular: Normal rate, regular rhythm and normal heart sounds.   No murmur heard. Pulmonary/Chest: No stridor. No respiratory distress. She has no wheezes. She has no  rales.  Musculoskeletal: Normal range of motion. She exhibits no edema.  Neurological: She is alert and oriented to person, place, and time.  Skin: Skin is warm.  Psychiatric: She has a normal mood and affect.   CT chest Lung cancer Screening: 08/2015 No nodules/massess PFT 08/2015 Ratio 52% fev1 1.1 46%  fvc 2.1 67%  WNL ONO WNL     ASSESSMENT/PLAN   71 yo white female with Severe COPD based on Pulmonary function with Gold Stage B with h/o extensive smoking history   Chronic obstructive pulmonary disease (HCC)  Gold COPD B -continueSpiriva -albuterol as needed -recommend avoid smoke exposure Recommend exercise  Follow up in 6 months    Patient/Family are satisfied with  Plan of action and management. All questions answered  Lucie Leather, M.D.  [name]  Medical Director [facility] Medical Director [facility] Cardio-Pulmonary Department

## 2016-08-17 NOTE — Patient Instructions (Signed)
Continue spiriva.  

## 2016-08-18 DIAGNOSIS — K635 Polyp of colon: Secondary | ICD-10-CM | POA: Diagnosis not present

## 2016-08-25 ENCOUNTER — Encounter: Payer: Self-pay | Admitting: Family Medicine

## 2016-08-31 DIAGNOSIS — J449 Chronic obstructive pulmonary disease, unspecified: Secondary | ICD-10-CM | POA: Diagnosis not present

## 2016-09-04 DIAGNOSIS — H43391 Other vitreous opacities, right eye: Secondary | ICD-10-CM | POA: Diagnosis not present

## 2016-09-04 DIAGNOSIS — H04123 Dry eye syndrome of bilateral lacrimal glands: Secondary | ICD-10-CM | POA: Diagnosis not present

## 2016-09-04 DIAGNOSIS — H524 Presbyopia: Secondary | ICD-10-CM | POA: Diagnosis not present

## 2016-09-04 DIAGNOSIS — Z961 Presence of intraocular lens: Secondary | ICD-10-CM | POA: Diagnosis not present

## 2016-09-30 DIAGNOSIS — J449 Chronic obstructive pulmonary disease, unspecified: Secondary | ICD-10-CM | POA: Diagnosis not present

## 2016-10-29 ENCOUNTER — Encounter: Payer: Self-pay | Admitting: Emergency Medicine

## 2016-10-29 ENCOUNTER — Emergency Department: Payer: Medicare Other

## 2016-10-29 ENCOUNTER — Encounter: Payer: Self-pay | Admitting: Family Medicine

## 2016-10-29 ENCOUNTER — Inpatient Hospital Stay
Admission: EM | Admit: 2016-10-29 | Discharge: 2016-10-31 | DRG: 192 | Disposition: A | Discharge status: Home / Self Care | Payer: Medicare Other | Attending: Internal Medicine | Admitting: Internal Medicine

## 2016-10-29 ENCOUNTER — Ambulatory Visit: Payer: Medicare Other

## 2016-10-29 ENCOUNTER — Ambulatory Visit (INDEPENDENT_AMBULATORY_CARE_PROVIDER_SITE_OTHER): Payer: Medicare Other | Admitting: Family Medicine

## 2016-10-29 VITALS — BP 104/60 | HR 79 | Temp 97.5°F | Wt 148.5 lb

## 2016-10-29 DIAGNOSIS — J441 Chronic obstructive pulmonary disease with (acute) exacerbation: Secondary | ICD-10-CM | POA: Diagnosis not present

## 2016-10-29 DIAGNOSIS — Z7952 Long term (current) use of systemic steroids: Secondary | ICD-10-CM

## 2016-10-29 DIAGNOSIS — Y92009 Unspecified place in unspecified non-institutional (private) residence as the place of occurrence of the external cause: Secondary | ICD-10-CM

## 2016-10-29 DIAGNOSIS — R519 Headache, unspecified: Secondary | ICD-10-CM

## 2016-10-29 DIAGNOSIS — Z79899 Other long term (current) drug therapy: Secondary | ICD-10-CM | POA: Diagnosis not present

## 2016-10-29 DIAGNOSIS — S0990XA Unspecified injury of head, initial encounter: Secondary | ICD-10-CM | POA: Diagnosis not present

## 2016-10-29 DIAGNOSIS — R0602 Shortness of breath: Secondary | ICD-10-CM | POA: Diagnosis not present

## 2016-10-29 DIAGNOSIS — R739 Hyperglycemia, unspecified: Secondary | ICD-10-CM | POA: Diagnosis present

## 2016-10-29 DIAGNOSIS — Z87891 Personal history of nicotine dependence: Secondary | ICD-10-CM

## 2016-10-29 DIAGNOSIS — R51 Headache: Secondary | ICD-10-CM | POA: Diagnosis not present

## 2016-10-29 DIAGNOSIS — W19XXXA Unspecified fall, initial encounter: Secondary | ICD-10-CM | POA: Diagnosis present

## 2016-10-29 DIAGNOSIS — J449 Chronic obstructive pulmonary disease, unspecified: Secondary | ICD-10-CM

## 2016-10-29 LAB — CBC
HEMATOCRIT: 40.5 % (ref 35.0–47.0)
Hemoglobin: 14 g/dL (ref 12.0–16.0)
MCH: 30.9 pg (ref 26.0–34.0)
MCHC: 34.4 g/dL (ref 32.0–36.0)
MCV: 89.7 fL (ref 80.0–100.0)
Platelets: 254 10*3/uL (ref 150–440)
RBC: 4.52 MIL/uL (ref 3.80–5.20)
RDW: 13 % (ref 11.5–14.5)
WBC: 6 10*3/uL (ref 3.6–11.0)

## 2016-10-29 LAB — BASIC METABOLIC PANEL
Anion gap: 8 (ref 5–15)
BUN: 22 mg/dL — ABNORMAL HIGH (ref 6–20)
CHLORIDE: 105 mmol/L (ref 101–111)
CO2: 26 mmol/L (ref 22–32)
Calcium: 9.3 mg/dL (ref 8.9–10.3)
Creatinine, Ser: 0.99 mg/dL (ref 0.44–1.00)
GFR calc Af Amer: >60 mL/min (ref >60)
GFR, EST NON AFRICAN AMERICAN: 56 mL/min — AB (ref >60)
GLUCOSE: 114 mg/dL — AB (ref 65–99)
POTASSIUM: 3.5 mmol/L (ref 3.5–5.1)
SODIUM: 139 mmol/L (ref 135–145)

## 2016-10-29 LAB — TROPONIN I: Troponin I: <0.03 ng/mL (ref <0.03)

## 2016-10-29 MED ORDER — ALBUTEROL SULFATE (2.5 MG/3ML) 0.083% IN NEBU
2.5000 mg | INHALATION_SOLUTION | Freq: Once | RESPIRATORY_TRACT | Status: AC
  Administered 2016-10-29: 2.5 mg via RESPIRATORY_TRACT

## 2016-10-29 MED ORDER — MONTELUKAST SODIUM 10 MG PO TABS
10.0000 mg | ORAL_TABLET | Freq: Every day | ORAL | Status: DC
  Administered 2016-10-29 – 2016-10-31 (×3): 10 mg via ORAL
  Filled 2016-10-29 (×2): qty 1

## 2016-10-29 MED ORDER — IPRATROPIUM-ALBUTEROL 0.5-2.5 (3) MG/3ML IN SOLN
3.0000 mL | Freq: Once | RESPIRATORY_TRACT | Status: AC
  Administered 2016-10-29: 3 mL via RESPIRATORY_TRACT
  Filled 2016-10-29: qty 3

## 2016-10-29 MED ORDER — ENOXAPARIN SODIUM 40 MG/0.4ML ~~LOC~~ SOLN
40.0000 mg | SUBCUTANEOUS | Status: DC
  Administered 2016-10-29: 40 mg via SUBCUTANEOUS
  Filled 2016-10-29: qty 0.4

## 2016-10-29 MED ORDER — FLUTTER DEVI
1.0000 | Freq: Once | Status: DC
  Administered 2016-10-30: 07:00:00 1
  Filled 2016-10-29: qty 1

## 2016-10-29 MED ORDER — IPRATROPIUM BROMIDE 0.02 % IN SOLN
0.5000 mg | Freq: Once | RESPIRATORY_TRACT | Status: AC
  Administered 2016-10-29: 0.5 mg via RESPIRATORY_TRACT

## 2016-10-29 MED ORDER — ONDANSETRON HCL 4 MG PO TABS: 4.0000 mg | ORAL_TABLET | Freq: Four times a day (QID) | ORAL | Status: DC | PRN

## 2016-10-29 MED ORDER — MONTELUKAST SODIUM 10 MG PO TABS: 10.0000 mg | ORAL_TABLET | Freq: Every day | ORAL | 3 refills | Status: DC

## 2016-10-29 MED ORDER — METHYLPREDNISOLONE SODIUM SUCC 40 MG IJ SOLR
40.0000 mg | Freq: Three times a day (TID) | INTRAMUSCULAR | Status: DC
  Administered 2016-10-29 – 2016-10-31 (×5): 40 mg via INTRAVENOUS
  Filled 2016-10-29 (×5): qty 1

## 2016-10-29 MED ORDER — IPRATROPIUM-ALBUTEROL 0.5-2.5 (3) MG/3ML IN SOLN: 3.0000 mL | Freq: Once | RESPIRATORY_TRACT | Status: DC

## 2016-10-29 MED ORDER — MAGNESIUM SULFATE 2 GM/50ML IV SOLN
2.0000 g | Freq: Once | INTRAVENOUS | Status: AC
  Administered 2016-10-29: 2 g via INTRAVENOUS
  Filled 2016-10-29: qty 50

## 2016-10-29 MED ORDER — METHYLPREDNISOLONE SODIUM SUCC 125 MG IJ SOLR
125.0000 mg | Freq: Once | INTRAMUSCULAR | Status: AC
  Administered 2016-10-29: 125 mg via INTRAVENOUS
  Filled 2016-10-29: qty 2

## 2016-10-29 MED ORDER — ACETAMINOPHEN 650 MG RE SUPP: 650.0000 mg | Freq: Four times a day (QID) | RECTAL | Status: DC | PRN

## 2016-10-29 MED ORDER — SODIUM CHLORIDE 0.9 % IV BOLUS (SEPSIS)
1000.0000 mL | Freq: Once | INTRAVENOUS | Status: AC
  Administered 2016-10-29: 1000 mL via INTRAVENOUS

## 2016-10-29 MED ORDER — ACETAMINOPHEN 325 MG PO TABS: 650.0000 mg | ORAL_TABLET | Freq: Four times a day (QID) | ORAL | Status: DC | PRN

## 2016-10-29 MED ORDER — AZITHROMYCIN 250 MG PO TABS: ORAL_TABLET | ORAL | 0 refills | Status: DC

## 2016-10-29 MED ORDER — PREDNISONE 10 MG PO TABS: 50.0000 mg | ORAL_TABLET | Freq: Every day | ORAL | 0 refills | Status: DC

## 2016-10-29 MED ORDER — ALBUTEROL SULFATE (2.5 MG/3ML) 0.083% IN NEBU
5.0000 mg | INHALATION_SOLUTION | Freq: Once | RESPIRATORY_TRACT | Status: AC
  Administered 2016-10-29: 5 mg via RESPIRATORY_TRACT
  Filled 2016-10-29: qty 6

## 2016-10-29 MED ORDER — TIOTROPIUM BROMIDE MONOHYDRATE 18 MCG IN CAPS
18.0000 ug | ORAL_CAPSULE | Freq: Every day | RESPIRATORY_TRACT | Status: DC
  Administered 2016-10-29 – 2016-10-31 (×3): 18 ug via RESPIRATORY_TRACT
  Filled 2016-10-29: qty 5

## 2016-10-29 MED ORDER — SENNOSIDES-DOCUSATE SODIUM 8.6-50 MG PO TABS: 1.0000 | ORAL_TABLET | Freq: Every evening | ORAL | Status: DC | PRN

## 2016-10-29 MED ORDER — IBUPROFEN 400 MG PO TABS: 400.0000 mg | ORAL_TABLET | Freq: Four times a day (QID) | ORAL | Status: DC | PRN

## 2016-10-29 MED ORDER — LEVOFLOXACIN IN D5W 750 MG/150ML IV SOLN
750.0000 mg | INTRAVENOUS | Status: DC
  Administered 2016-10-29 – 2016-10-31 (×3): 750 mg via INTRAVENOUS
  Filled 2016-10-29 (×4): qty 150

## 2016-10-29 MED ORDER — TIOTROPIUM BROMIDE MONOHYDRATE 18 MCG IN CAPS
18.0000 ug | ORAL_CAPSULE | Freq: Every day | RESPIRATORY_TRACT | Status: DC
  Administered 2016-10-29: 11:00:00 18 ug via RESPIRATORY_TRACT
  Filled 2016-10-29: qty 5

## 2016-10-29 MED ORDER — IPRATROPIUM-ALBUTEROL 0.5-2.5 (3) MG/3ML IN SOLN
3.0000 mL | RESPIRATORY_TRACT | Status: DC
  Administered 2016-10-29 – 2016-10-30 (×4): 3 mL via RESPIRATORY_TRACT
  Filled 2016-10-29 (×4): qty 3

## 2016-10-29 MED ORDER — ONDANSETRON HCL 4 MG/2ML IJ SOLN: 4.0000 mg | Freq: Four times a day (QID) | INTRAMUSCULAR | Status: DC | PRN

## 2016-10-29 MED ORDER — PREDNISONE 20 MG PO TABS: 40.0000 mg | ORAL_TABLET | Freq: Every day | ORAL | 0 refills | Status: DC

## 2016-10-29 MED ORDER — VITAMIN B-12 1000 MCG PO TABS
1000.0000 ug | ORAL_TABLET | Freq: Every day | ORAL | Status: DC
  Administered 2016-10-29 – 2016-10-31 (×3): 1000 ug via ORAL
  Filled 2016-10-29 (×2): qty 1

## 2016-10-29 NOTE — Assessment & Plan Note (Addendum)
Trigger: weather changes, increased humidity/heat. No signs of bacterial infection.  Treated in office with alb/atrovent neb x2. Some improvement in air movement, but persistent wheezing and dyspnea and respiratory distress.  For this reason I recommended further evaluation/[facility] - pt will go there today. Her significant other will driver her.  Discussed avoidance of environmental triggers.

## 2016-10-29 NOTE — Progress Notes (Signed)
BP 104/60   Pulse 79   Temp 97.5 F (36.4 C) (Oral)   Wt 148 lb 8 oz (67.4 kg)   SpO2 95%   BMI 25.49 kg/m    CC: bump on head Subjective:    Patient ID: AKIBA MELFI, female    DOB: Jun 11, 1945, 71 y.o.   MRN: [id number]  HPI: PHILOMENA BUTTERMORE is a 71 y.o. female presenting on 10/29/2016 for Larey Seat a week ago, bump on head   DOI: 10/21/2016  Fall 1 wk ago where she hit her head. Fell off sofa onto hardwood floor, hit head R parietal scalp with metal folding chair. No LOC. Did not break skin. Feeling tired and more fatigued since. No nausea/vomiting since fall. Ongoing scalp soreness. Worse headache since fall. Not on blood thinners. Worse dizziness since fall. Treating with tylenol and ibuprofen which was helpful. Has not tried ice.   Also noticing increasing dyspnea, wheezing, increased cough with recent weather change over last few weeks. Known h/o COPD - treated with singulair, spiriva and PRN albuterol. No sputum production. Ex smoker - quit 2003. Using albuterol rescue inhaler TID lately. Requests singulair refilled. No fevers/chills.   Relevant past medical, surgical, family and social history reviewed and updated as indicated. Interim medical history since our last visit reviewed. Allergies and medications reviewed and updated. Outpatient Medications Prior to Visit  Medication Sig Dispense Refill  . albuterol (PROVENTIL) (2.5 MG/3ML) 0.083% nebulizer solution Take 3 mLs (2.5 mg total) by nebulization every 4 (four) hours as needed. Reported on 10/11/2015 125 mL 2  . Biotin 57846 MCG TABS Take 1 tablet by mouth.    . calcium-vitamin D (OSCAL WITH D) 500-200 MG-UNIT tablet Take 1 tablet by mouth.    . Multiple Vitamins-Minerals (MULTIVITAMIN ADULT PO) Take 1 tablet by mouth.    Marland Kitchen PROAIR HFA 108 (90 Base) MCG/ACT inhaler Inhale 2 puffs into the lungs every 4 (four) hours as needed. 1 Inhaler 3  . Probiotic Product (PROBIOTIC-10 PO) Take 1 tablet by mouth.    . Respiratory  Therapy Supplies (FLUTTER) DEVI 1 each by Does not apply route once. 1 each 0  . tiotropium (SPIRIVA) 18 MCG inhalation capsule Place 1 capsule (18 mcg total) into inhaler and inhale daily. 30 capsule 6  . vitamin B-12 (CYANOCOBALAMIN) 1000 MCG tablet Take 1,000 mcg by mouth daily.    . montelukast (SINGULAIR) 10 MG tablet Take 1 tablet by mouth daily.  3  . Omega-3 Fatty Acids (FISH OIL) 1000 MG CAPS Take 1 capsule (1,000 mg total) by mouth daily.  0   No facility-administered medications prior to visit.      Per HPI unless specifically indicated in ROS section below Review of Systems     Objective:    BP 104/60   Pulse 79   Temp 97.5 F (36.4 C) (Oral)   Wt 148 lb 8 oz (67.4 kg)   SpO2 95%   BMI 25.49 kg/m   Wt Readings from Last 3 Encounters:  10/29/16 148 lb 8 oz (67.4 kg)  08/17/16 149 lb 12.8 oz (67.9 kg)  06/26/16 143 lb (64.9 kg)    Physical Exam  Constitutional: She is oriented to person, place, and time. She appears well-developed and well-nourished. No distress.  HENT:  Head: Normocephalic and atraumatic.  Mouth/Throat: Oropharynx is clear and moist. No oropharyngeal exudate.  Swelling left parietal scalp along with point tenderness throughout left parietal skull  Eyes: Conjunctivae and EOM are normal. Pupils are equal,  round, and reactive to light. No scleral icterus.  Cardiovascular: Normal rate, regular rhythm, normal heart sounds and intact distal pulses.   No murmur heard. Pulmonary/Chest: She is in respiratory distress. She has decreased breath sounds. She has wheezes. She has no rhonchi. She has no rales.  Diffuse wheezing throughout with increased work of breathing  Musculoskeletal: She exhibits no edema.  Neurological: She is alert and oriented to person, place, and time. No cranial nerve deficit. Coordination and gait normal.  CN 2-12 intact FTN intact EOMI  Nursing note and vitals reviewed.  Post 1st  albuterol/atrovent neb: Improved air movement,  persistent dyspnea and wheeze  Post 2nd albuterol/atrovent neb: Improved air movement, persistent dyspnea, wheezing, cough     Assessment & Plan:   Problem List Items Addressed This Visit    Chronic obstructive pulmonary disease (HCC)    Followed by Dr Belia HemanKasa. Ex smoker.  Refilled singulair.       Relevant Medications   montelukast (SINGULAIR) 10 MG tablet   albuterol (PROVENTIL) (2.5 MG/3ML) 0.083% nebulizer solution 2.5 mg (Completed)   ipratropium-albuterol (DUONEB) 0.5-2.5 (3) MG/3ML nebulizer solution 3 mL (Completed)   predniSONE (DELTASONE) 20 MG tablet   albuterol (PROVENTIL) (2.5 MG/3ML) 0.083% nebulizer solution 2.5 mg (Completed)   ipratropium (ATROVENT) nebulizer solution 0.5 mg (Completed)   ipratropium (ATROVENT) nebulizer solution 0.5 mg (Completed)   COPD exacerbation (HCC) - Primary    Trigger: weather changes, increased humidity/heat. No signs of bacterial infection.  Treated in office with alb/atrovent neb x2. Some improvement in air movement, but persistent wheezing and dyspnea and respiratory distress.  For this reason I recommended further evaluation/[facility] - pt will go there today. Her significant other will driver her.  Discussed avoidance of environmental triggers.       Relevant Medications   montelukast (SINGULAIR) 10 MG tablet   albuterol (PROVENTIL) (2.5 MG/3ML) 0.083% nebulizer solution 2.5 mg (Completed)   ipratropium-albuterol (DUONEB) 0.5-2.5 (3) MG/3ML nebulizer solution 3 mL (Completed)   predniSONE (DELTASONE) 20 MG tablet   albuterol (PROVENTIL) (2.5 MG/3ML) 0.083% nebulizer solution 2.5 mg (Completed)   ipratropium (ATROVENT) nebulizer solution 0.5 mg (Completed)   ipratropium (ATROVENT) nebulizer solution 0.5 mg (Completed)   Head injury, acute, initial encounter    Fall without LOC over 1 wk ago, pain and discomfort actually worsening despite tylenol, NSAID. Possible slowing of cognition noted as well, although otherwise  non-focal neurological exam. Point tender at parietal skull.  Check CT scan r/o subdural hematoma, r/o parietal skull fracture.  Rec start ice to skull at area of fall.  Pt agrees with plan.   ADDENDUM ==> actually we ended up [facility] for COPD exacerbation, will keep head CT in system but discussed I recommended breathing evaluation/stabilization first, if head imaging not done, to contact us and we will schedule when released from hospital.       Relevant Orders   CT Head Wo Contrast       Follow up plan: Return if symptoms worsen or fail to improve.  Eustaquio BoydenJavier Chrisie Jankovich, MD

## 2016-10-29 NOTE — ED Provider Notes (Signed)
 -----------------------------------------   5:06 PM on 10/29/2016 -----------------------------------------  Reassessed, patient still with increased work of breathing, tachypnea. Some accessory muscle use. Diffuse expiratory wheezing with significant prolonged expiratory phase. Oxygenation is adequate although occasionally dips down to 89% on room air. She's not on home oxygen. This is despite 5 nebs, Solu-Medrol, magnesium, IV fluids. Case d/w hospitalist for further management.  ----------------------------------------- 5:07 PM on 10/29/2016 -----------------------------------------   END OF OBSERVATION STATUS: After an appropriate period of observation, this patient is being admitted due to the following reason(s):  COPD exacerbation    Sharman CheekStafford, Citlali Gautney, MD 10/29/16 [phone]

## 2016-10-29 NOTE — ED Triage Notes (Signed)
Pt presents to ED with c/o shortness of breath. [facility] is [facility] at [facility] and was sent over for COPD exacerbation, per nurse that called report pt was given 2 duonebs in the office without relief. Pt is noted to have increased work of breathing at this time, is occasionally able to speak in complete sentences.

## 2016-10-29 NOTE — Patient Instructions (Addendum)
For COPD flare - treated in office with albuterol/atrovent neb x2 without improvement. I recommend evaluation at [facility] today to help stabilize breathing.  Avoid exposure to hot humid weather as much as able, especially with summer coming on. AC is your friend.  For head injury - we will schedule head CT once breathing is better. May use ice (covered with towel) to right skull for 10-5015min at a time. May continue tylenol.

## 2016-10-29 NOTE — H&P (Signed)
[facility] [place] [facility]   PATIENT NAME: Stacey Durham    MR#:  [id number]  DATE OF BIRTH:  27-Feb-1946  DATE OF ADMISSION:  10/29/2016  PRIMARY CARE PHYSICIAN: Eustaquio Boyden, MD   REQUESTING/REFERRING PHYSICIAN: dr Scotty Court  CHIEF COMPLAINT:   SOB HISTORY OF PRESENT ILLNESS:  Stacey Durham  is a 71 y.o. female with a known history of COPD who presents with above complaint. Over the past several weeks patient has had increasing shortness of breath, wheezing and cough. She presents today because of shortness of breath has worsened. In the emergency room she has received several rounds of nebulizer treatments and IV steroids however she continues to have wheezing and shortness of breath. She denies fever, chills, recent travel or sick contacts.  PAST MEDICAL HISTORY:   Past Medical History:  Diagnosis Date  . Acute cholecystitis   . Cholecystitis with cholelithiasis 10/11/2015  . COPD (chronic obstructive pulmonary disease) (HCC)   . Ex-smoker    quit 2000s  . Pneumonia 11/26/2015    PAST SURGICAL HISTORY:   Past Surgical History:  Procedure Laterality Date  . ABDOMINAL HYSTERECTOMY  1978   part of an ovary remained  . APPENDECTOMY  1978  . CATARACT EXTRACTION, BILATERAL  2013  . CHOLECYSTECTOMY N/A 10/11/2015   Procedure: LAPAROSCOPIC CHOLECYSTECTOMY;  Surgeon: Leafy Ro, MD;  Location: [facility] ORS;  Service: General;  Laterality: N/A;  . COLONOSCOPY  07/2016   HP, ext hem, diverticulosis, rpt 5 yrs (Outlaw)  . TONSILLECTOMY      SOCIAL HISTORY:   Social History  Substance Use Topics  . Smoking status: Former Smoker    Packs/day: 1.00    Years: 40.00    Quit date: 06/01/2001  . Smokeless tobacco: Never Used  . Alcohol use No    FAMILY HISTORY:   Family History  Problem Relation Age of Onset  . Heart attack Father 20  . Diabetes Father   . Diabetes Mother   . Stroke Neg Hx   . Cancer Neg Hx     DRUG ALLERGIES:  No  Known Allergies  REVIEW OF SYSTEMS:   Review of Systems  Constitutional: Negative.  Negative for chills, fever and malaise/fatigue.  HENT: Negative.  Negative for ear discharge, ear pain, hearing loss, nosebleeds and sore throat.   Eyes: Negative.  Negative for blurred vision and pain.  Respiratory: Positive for cough and shortness of breath. Negative for hemoptysis and wheezing.   Cardiovascular: Negative.  Negative for chest pain, palpitations and leg swelling.  Gastrointestinal: Negative.  Negative for abdominal pain, blood in stool, diarrhea, nausea and vomiting.  Genitourinary: Negative.  Negative for dysuria.  Musculoskeletal: Negative.  Negative for back pain.  Skin: Negative.   Neurological: Negative for dizziness, tremors, speech change, focal weakness, seizures and headaches.  Endo/Heme/Allergies: Negative.  Does not bruise/bleed easily.  Psychiatric/Behavioral: Negative.  Negative for depression, hallucinations and suicidal ideas.    MEDICATIONS AT HOME:   Prior to Admission medications   Medication Sig Start Date End Date Taking? Authorizing Provider  albuterol (PROVENTIL) (2.5 MG/3ML) 0.083% nebulizer solution Take 3 mLs (2.5 mg total) by nebulization every 4 (four) hours as needed. Reported on 10/11/2015 11/26/15  Yes Mungal, Vishal, MD  Biotin 40981 MCG TABS Take 1 tablet by mouth.   Yes [provider]  calcium-vitamin D (OSCAL WITH D) 500-200 MG-UNIT tablet Take 1 tablet by mouth.   Yes [provider]  montelukast (SINGULAIR) 10 MG tablet Take 1  tablet (10 mg total) by mouth daily. 10/29/16  Yes Eustaquio BoydenGutierrez, Javier, MD  Multiple Vitamins-Minerals (MULTIVITAMIN ADULT PO) Take 1 tablet by mouth.   Yes [provider]  PROAIR HFA 108 (90 Base) MCG/ACT inhaler Inhale 2 puffs into the lungs every 4 (four) hours as needed. 11/26/15  Yes Mungal, Vishal, MD  Probiotic Product (PROBIOTIC-10 PO) Take 1 tablet by mouth.   Yes [provider]   tiotropium (SPIRIVA) 18 MCG inhalation capsule Place 1 capsule (18 mcg total) into inhaler and inhale daily. 07/02/16  Yes Erin FullingKasa, Kurian, MD  vitamin B-12 (CYANOCOBALAMIN) 1000 MCG tablet Take 1,000 mcg by mouth daily.   Yes [provider]  azithromycin (ZITHROMAX Z-PAK) 250 MG tablet As directed 10/29/16   Merrily Brittleifenbark, Neil, MD  predniSONE (DELTASONE) 10 MG tablet Take 5 tablets (50 mg total) by mouth daily. 10/29/16 11/02/16  Merrily Brittleifenbark, Neil, MD  Respiratory Therapy Supplies (FLUTTER) DEVI 1 each by Does not apply route once. 11/26/15   Mungal, Eston EstersVishal, MD      VITAL SIGNS:  Blood pressure 127/80, pulse (!) 109, temperature 98.1 F (36.7 C), temperature source Axillary, resp. rate (!) 22, height 5\' 4"  (1.626 m), weight 67.1 kg (148 lb), SpO2 94 %.  PHYSICAL EXAMINATION:   Physical Exam  Constitutional: She is oriented to person, place, and time and well-developed, well-nourished, and in no distress. No distress.  HENT:  Head: Normocephalic.  Eyes: No scleral icterus.  Neck: Normal range of motion. Neck supple. No JVD present. No tracheal deviation present.  Cardiovascular: Normal rate, regular rhythm and normal heart sounds.  Exam reveals no gallop and no friction rub.   No murmur heard. Pulmonary/Chest: Effort normal. No respiratory distress. She has wheezes (Diffuse prolonged expiratory wheezing). She has no rales. She exhibits no tenderness.  Abdominal: Soft. Bowel sounds are normal. She exhibits no distension and no mass. There is no tenderness. There is no rebound and no guarding.  Musculoskeletal: Normal range of motion. She exhibits no edema.  Neurological: She is alert and oriented to person, place, and time.  Skin: Skin is warm. No rash noted. No erythema.  Psychiatric: Affect and judgment normal.      LABORATORY PANEL:   CBC  Recent Labs Lab 10/29/16 1424  WBC 6.0  HGB 14.0  HCT 40.5  PLT 254    ------------------------------------------------------------------------------------------------------------------  Chemistries   Recent Labs Lab 10/29/16 1424  NA 139  K 3.5  CL 105  CO2 26  GLUCOSE 114*  BUN 22*  CREATININE 0.99  CALCIUM 9.3   ------------------------------------------------------------------------------------------------------------------  Cardiac Enzymes  Recent Labs Lab 10/29/16 1424  TROPONINI <0.03   ------------------------------------------------------------------------------------------------------------------  RADIOLOGY:  Dg Chest 2 View  Result Date: 10/29/2016 CLINICAL DATA:  [age] year old presenting with a 2 week history of shortness of breath. Former smoker with current history of COPD. EXAM: CHEST  2 VIEW COMPARISON:  CT chest 01/06/2016, screening CT chest 09/27/2015. Chest x-ray 11/22/2015, 08/16/2015 and earlier. FINDINGS: Cardiac silhouette upper normal in size, unchanged. Thoracic aorta atherosclerotic and mildly tortuous, unchanged. Hilar and mediastinal contours otherwise unremarkable. Stable calcified granuloma involving the left upper lobe. Emphysematous changes in the upper lobes, unchanged. Linear scarring in the lower lobes, unchanged. No new pulmonary parenchymal abnormalities. No pleural effusions. No pneumothorax. Degenerative changes involving the thoracic spine. IMPRESSION: COPD/emphysema. No acute cardiopulmonary disease. Stable scarring in the lower lobes and stable calcified granuloma involving the left upper lobe. Electronically Signed   By: Hulan Saashomas  Lawrence M.D.   On: 10/29/2016 15:00  EKG:    Normal sinus rhythm without ST elevation or depression IMPRESSION AND PLAN:   71 year old female with history of COPD who presents with shortness of breath, cough and wheezing over the past 2-3 weeks.  1. Acute exacerbation of COPD with diffuse wheezing acute bronchitis: Continue IV steroids, nebs and Levaquin for acute  bronchitis  2. Hyperglycemia: Repeat BMP in a.m. If this is still elevated consider ordering hemoglobin A1c  All the records are reviewed and case discussed with ED provider. Management plans discussed with the patient and she is in agreement  CODE STATUS: full  TOTAL TIME TAKING CARE OF THIS PATIENT: 45 minutes.    Kerrigan Gombos M.D on 10/29/2016 at 5:13 PM  Between 7am to 6pm - Pager - [phone]  After 6pm go to [email or web address] - Social research officer, government  [facility]  Office  [phone]  CC: Primary care physician; Eustaquio Boyden, MD

## 2016-10-29 NOTE — Assessment & Plan Note (Addendum)
Followed by Dr Belia HemanKasa. Ex smoker.  Refilled singulair.

## 2016-10-29 NOTE — Assessment & Plan Note (Addendum)
Fall without LOC over 1 wk ago, pain and discomfort actually worsening despite tylenol, NSAID. Possible slowing of cognition noted as well, although otherwise non-focal neurological exam. Point tender at parietal skull.  Check CT scan r/o subdural hematoma, r/o parietal skull fracture.  Rec start ice to skull at area of fall.  Pt agrees with plan.   ADDENDUM ==> actually we ended up [facility] for COPD exacerbation, will keep head CT in system but discussed I recommended breathing evaluation/stabilization first, if head imaging not done, to contact us and we will schedule when released from hospital.

## 2016-10-29 NOTE — ED Notes (Signed)
Report called to floor nurse wynetta rn.

## 2016-10-29 NOTE — Discharge Instructions (Signed)
Please take your antibiotics and your steroids as prescribed and follow-up with your primary care physician on Monday for recheck. Return to the emergency department sooner for any new or worsening symptoms such as shortness of breath, if you cannot eat or drink, or for any other concerns.  It was a pleasure to take care of you today, and thank you for coming to our emergency department.  If you have any questions or concerns before leaving please ask the nurse to grab me and I'm more than happy to go through your aftercare instructions again.  If you were prescribed any opioid pain medication today such as Norco, Vicodin, Percocet, morphine, hydrocodone, or oxycodone please make sure you do not drive when you are taking this medication as it can alter your ability to drive safely.  If you have any concerns once you are home that you are not improving or are in fact getting worse before you can make it to your follow-up appointment, please do not hesitate to call 911 and come back for further evaluation.  Merrily BrittleNeil Jakyiah Briones MD  Results for orders placed or performed during the hospital encounter of 10/29/16  Basic metabolic panel  Result Value Ref Range   Sodium 139 135 - 145 mmol/L   Potassium 3.5 3.5 - 5.1 mmol/L   Chloride 105 101 - 111 mmol/L   CO2 26 22 - 32 mmol/L   Glucose, Bld 114 (H) 65 - 99 mg/dL   BUN 22 (H) 6 - 20 mg/dL   Creatinine, Ser 7.820.99 0.44 - 1.00 mg/dL   Calcium 9.3 8.9 - 95.610.3 mg/dL   GFR calc non Af Amer 56 (L) >60 mL/min   GFR calc Af Amer >60 >60 mL/min   Anion gap 8 5 - 15  CBC  Result Value Ref Range   WBC 6.0 3.6 - 11.0 K/uL   RBC 4.52 3.80 - 5.20 MIL/uL   Hemoglobin 14.0 12.0 - 16.0 g/dL   HCT 21.340.5 08.635.0 - 57.847.0 %   MCV 89.7 80.0 - 100.0 fL   MCH 30.9 26.0 - 34.0 pg   MCHC 34.4 32.0 - 36.0 g/dL   RDW 46.913.0 62.911.5 - 52.814.5 %   Platelets 254 150 - 440 K/uL  Troponin I  Result Value Ref Range   Troponin I <0.03 <0.03 ng/mL   Dg Chest 2 View  Result Date:  10/29/2016 CLINICAL DATA:  71 year old presenting with a 2 week history of shortness of breath. Former smoker with current history of COPD. EXAM: CHEST  2 VIEW COMPARISON:  CT chest 01/06/2016, screening CT chest 09/27/2015. Chest x-ray 11/22/2015, 08/16/2015 and earlier. FINDINGS: Cardiac silhouette upper normal in size, unchanged. Thoracic aorta atherosclerotic and mildly tortuous, unchanged. Hilar and mediastinal contours otherwise unremarkable. Stable calcified granuloma involving the left upper lobe. Emphysematous changes in the upper lobes, unchanged. Linear scarring in the lower lobes, unchanged. No new pulmonary parenchymal abnormalities. No pleural effusions. No pneumothorax. Degenerative changes involving the thoracic spine. IMPRESSION: COPD/emphysema. No acute cardiopulmonary disease. Stable scarring in the lower lobes and stable calcified granuloma involving the left upper lobe. Electronically Signed   By: Hulan Saashomas  Lawrence M.D.   On: 10/29/2016 15:00

## 2016-10-29 NOTE — ED Provider Notes (Signed)
[facility] Emergency Department Provider Note  ____________________________________________   First MD Initiated Contact with Patient 10/29/16 1437     (approximate)  I have reviewed the triage vital signs and the nursing notes.   HISTORY  Chief Complaint Shortness of Breath   HPI Stacey Durham is a 71 y.o. female who is sent to the emergency department from her primary care office for shortness of breath. She initially went to her primary care physician today not for any shortness of breath that because she wanted to be evaluated for headache after a trauma last week. Although she was noted to be quite short of breath and was given 2 nebulizations and then advised to come to the hospital. The patient is a long-standing history of COPD and says for the past several days with the change in the weather she has felt more short of breath requiring more albuterol use at home. She does not use home oxygen. She has sharp moderate severity aching chest pain worse when coughing improved when not coughing. She has no sputum production. No fevers or chills.   Past Medical History:  Diagnosis Date  . Acute cholecystitis   . Cholecystitis with cholelithiasis 10/11/2015  . COPD (chronic obstructive pulmonary disease) (HCC)   . Ex-smoker    quit 2000s  . Pneumonia 11/26/2015    Patient Active Problem List   Diagnosis Date Noted  . Head injury, acute, initial encounter 10/29/2016  . Health maintenance examination 06/26/2016  . Advanced care planning/counseling discussion 06/26/2016  . Incontinence of feces with fecal urgency 06/26/2016  . HLD (hyperlipidemia) 06/26/2016  . Prediabetes 06/26/2016  . Infected epidermoid cyst 01/23/2016  . Hair loss 01/07/2016  . Abnormal TSH 01/07/2016  . Aortic atherosclerosis (HCC) 01/07/2016  . CAD (coronary artery disease) 01/07/2016  . COPD exacerbation (HCC) 11/26/2015  . Ex-smoker 09/25/2015  . Chronic obstructive  pulmonary disease (HCC) 08/16/2015    Past Surgical History:  Procedure Laterality Date  . ABDOMINAL HYSTERECTOMY  1978   part of an ovary remained  . APPENDECTOMY  1978  . CATARACT EXTRACTION, BILATERAL  2013  . CHOLECYSTECTOMY N/A 10/11/2015   Procedure: LAPAROSCOPIC CHOLECYSTECTOMY;  Surgeon: Leafy Roiego F Pabon, MD;  Location: [facility] ORS;  Service: General;  Laterality: N/A;  . COLONOSCOPY  07/2016   HP, ext hem, diverticulosis, rpt 5 yrs (Outlaw)  . TONSILLECTOMY      Prior to Admission medications   Medication Sig Start Date End Date Taking? Authorizing Provider  albuterol (PROVENTIL) (2.5 MG/3ML) 0.083% nebulizer solution Take 3 mLs (2.5 mg total) by nebulization every 4 (four) hours as needed. Reported on 10/11/2015 11/26/15   Stephanie AcreMungal, Vishal, MD  azithromycin (ZITHROMAX Z-PAK) 250 MG tablet As directed 10/29/16   Merrily Brittleifenbark, Briar Sword, MD  Biotin 1610910000 MCG TABS Take 1 tablet by mouth.    [provider]  calcium-vitamin D (OSCAL WITH D) 500-200 MG-UNIT tablet Take 1 tablet by mouth.    [provider]  montelukast (SINGULAIR) 10 MG tablet Take 1 tablet (10 mg total) by mouth daily. 10/29/16   Eustaquio BoydenGutierrez, Javier, MD  Multiple Vitamins-Minerals (MULTIVITAMIN ADULT PO) Take 1 tablet by mouth.    [provider]  predniSONE (DELTASONE) 10 MG tablet Take 5 tablets (50 mg total) by mouth daily. 10/29/16 11/02/16  Merrily Brittleifenbark, Laneah Luft, MD  PROAIR HFA 108 [phone](90 Base) MCG/ACT inhaler Inhale 2 puffs into the lungs every 4 (four) hours as needed. 11/26/15   Stephanie AcreMungal, Vishal, MD  Probiotic Product (PROBIOTIC-10 PO)  Take 1 tablet by mouth.    [provider]  Respiratory Therapy Supplies (FLUTTER) DEVI 1 each by Does not apply route once. 11/26/15   Stephanie Acre, MD  tiotropium (SPIRIVA) 18 MCG inhalation capsule Place 1 capsule (18 mcg total) into inhaler and inhale daily. 07/02/16   Erin Fulling, MD  vitamin B-12 (CYANOCOBALAMIN) 1000 MCG tablet Take 1,000 mcg by mouth daily.    [provider]    Allergies Patient has no known allergies.  Family History  Problem Relation Age of Onset  . Heart attack Father 95  . Diabetes Father   . Diabetes Mother   . Stroke Neg Hx   . Cancer Neg Hx     Social History Social History  Substance Use Topics  . Smoking status: Former Smoker    Packs/day: 1.00    Years: 40.00    Quit date: 06/01/2001  . Smokeless tobacco: Never Used  . Alcohol use No    Review of Systems Constitutional: No fever/chills Eyes: No visual changes. ENT: No sore throat. Cardiovascular: Positive chest pain. Respiratory: Positive shortness of breath. Gastrointestinal: No abdominal pain.  No nausea, no vomiting.  No diarrhea.  No constipation. Genitourinary: Negative for dysuria. Musculoskeletal: Negative for back pain. Skin: Negative for rash. Neurological: Negative for headaches, focal weakness or numbness.   ____________________________________________   PHYSICAL EXAM:  VITAL SIGNS: ED Triage Vitals  Enc Vitals Group     BP 10/29/16 1428 125/70     Pulse Rate 10/29/16 1428 80     Resp 10/29/16 1428 (!) 26     Temp 10/29/16 1428 98.1 F (36.7 C)     Temp Source 10/29/16 1428 Axillary     SpO2 10/29/16 1428 98 %     Weight 10/29/16 1421 148 lb (67.1 kg)     Height 10/29/16 1421 5\' 4"  (1.626 m)     Head Circumference --      Peak Flow --      Pain Score --      Pain Loc --      Pain Edu? --      Excl. in GC? --     Constitutional: Alert and oriented x 4 Using accessory muscles appears quite short of breath speaking in 3-4 word sentences although laughing and joking Eyes: PERRL EOMI. Head: Atraumatic. Nose: No congestion/rhinnorhea. Mouth/Throat: No trismus Neck: No stridor.   Cardiovascular: Normal rate, regular rhythm. Grossly normal heart sounds.  Good peripheral circulation. Respiratory: Increased respiratory effort using accessory muscles and diffuse expiratory wheezes throughout with prolonged expiratory phase but  able to move air Gastrointestinal: Soft nontender Musculoskeletal: No lower extremity edema   Neurologic:  Normal speech and language. No gross focal neurologic deficits are appreciated. Skin:  Skin is warm, dry and intact. No rash noted. Psychiatric: Mood and affect are normal. Speech and behavior are normal.    ____________________________________________   DIFFERENTIAL  COPD exacerbation, pneumothorax, lobar collapse, pneumonia, pulmonary embolism, acute coronary syndrome, CHF ____________________________________________   LABS (all labs ordered are listed, but only abnormal results are displayed)  Labs Reviewed  BASIC METABOLIC PANEL - Abnormal; Notable for the following:       Result Value   Glucose, Bld 114 (*)    BUN 22 (*)    GFR calc non Af Amer 56 (*)    All other components within normal limits  CBC  TROPONIN I    Labs unremarkable __________________________________________  EKG  ED ECG REPORT I, Merrily Brittle, the  attending physician, personally viewed and interpreted this ECG.  Date: 10/29/2016 Rate: 70 Rhythm: normal sinus rhythm QRS Axis: normal Intervals: normal ST/T Wave abnormalities: normal Conduction Disturbances: none Narrative Interpretation: unremarkable  ____________________________________________  RADIOLOGY  Chest x-ray with COPD but no acute disease noted ____________________________________________   PROCEDURES  Procedure(s) performed: no  Procedures  Critical Care performed: no  Observation: yes ____________________________________________   INITIAL IMPRESSION / ASSESSMENT AND PLAN / ED COURSE  Pertinent labs & imaging results that were available during my care of the patient were reviewed by me and considered in my medical decision making (see chart for details).  On arrival the patient is quite short of breath and wheezy. I will give her 3 more nebulizations, magnesium, Solu-Medrol, and IV fluids for her  dehydration and reevaluate. Unclear if the patient will require inpatient admission at this time. She is hoping to go home.     ----------------------------------------- 3:00 PM on 10/29/2016 -----------------------------------------   OBSERVATION CARE: This patient is being placed under observation care for the following reasons: The patient has COPD and requires serial exams with multiple treatments to determine response to treatment and the possibility for inpatient admission versus outpatient management.   ----------------------------------------- 3:23 PM on 10/29/2016 -----------------------------------------  After 2 nebulizations the patient's lungs are more clear but still persists with wheezes. I will defer antibiotics at this time as I doubt this is a bacterial source I will continue to observe. ____________________________________________   FINAL CLINICAL IMPRESSION(S) / ED DIAGNOSES  Final diagnoses:  COPD exacerbation (HCC)      NEW MEDICATIONS STARTED DURING THIS VISIT:  New Prescriptions   AZITHROMYCIN (ZITHROMAX Z-PAK) 250 MG TABLET    As directed   PREDNISONE (DELTASONE) 10 MG TABLET    Take 5 tablets (50 mg total) by mouth daily.     Note:  This document was prepared using Dragon voice recognition software and may include unintentional dictation errors.     Merrily Brittle, MD 10/29/16 1526

## 2016-10-29 NOTE — ED Notes (Signed)
States feeling better but feeling jittery.

## 2016-10-29 NOTE — ED Notes (Signed)
Resumed care from [place] rn.  Pt alert.  Receiving alb treatment.  Family with pt.

## 2016-10-30 ENCOUNTER — Inpatient Hospital Stay: Payer: Medicare Other

## 2016-10-30 LAB — CBC
HEMATOCRIT: 38.7 % (ref 35.0–47.0)
HEMOGLOBIN: 13.3 g/dL (ref 12.0–16.0)
MCH: 30.8 pg (ref 26.0–34.0)
MCHC: 34.5 g/dL (ref 32.0–36.0)
MCV: 89.4 fL (ref 80.0–100.0)
Platelets: 246 10*3/uL (ref 150–440)
RBC: 4.33 MIL/uL (ref 3.80–5.20)
RDW: 12.8 % (ref 11.5–14.5)
WBC: 10 10*3/uL (ref 3.6–11.0)

## 2016-10-30 LAB — BASIC METABOLIC PANEL
ANION GAP: 9 (ref 5–15)
BUN: 20 mg/dL (ref 6–20)
CO2: 21 mmol/L — AB (ref 22–32)
Calcium: 8.8 mg/dL — ABNORMAL LOW (ref 8.9–10.3)
Chloride: 109 mmol/L (ref 101–111)
Creatinine, Ser: 1.19 mg/dL — ABNORMAL HIGH (ref 0.44–1.00)
GFR calc non Af Amer: 45 mL/min — ABNORMAL LOW (ref >60)
GFR, EST AFRICAN AMERICAN: 52 mL/min — AB (ref >60)
GLUCOSE: 175 mg/dL — AB (ref 65–99)
Potassium: 3.6 mmol/L (ref 3.5–5.1)
Sodium: 139 mmol/L (ref 135–145)

## 2016-10-30 MED ORDER — IPRATROPIUM-ALBUTEROL 0.5-2.5 (3) MG/3ML IN SOLN
3.0000 mL | Freq: Three times a day (TID) | RESPIRATORY_TRACT | Status: DC
  Administered 2016-10-30 – 2016-10-31 (×2): 3 mL via RESPIRATORY_TRACT
  Filled 2016-10-30 (×3): qty 3

## 2016-10-30 MED ORDER — RISAQUAD PO CAPS
1.0000 | ORAL_CAPSULE | Freq: Two times a day (BID) | ORAL | Status: DC
Start: 2016-10-30 — End: 2016-10-31
  Administered 2016-10-30: 16:00:00 1 via ORAL
  Filled 2016-10-30: qty 1

## 2016-10-30 MED ORDER — IPRATROPIUM-ALBUTEROL 0.5-2.5 (3) MG/3ML IN SOLN
3.0000 mL | Freq: Four times a day (QID) | RESPIRATORY_TRACT | Status: DC
  Administered 2016-10-30 (×2): 3 mL via RESPIRATORY_TRACT
  Filled 2016-10-30 (×2): qty 3

## 2016-10-30 NOTE — Progress Notes (Signed)
[facility] - [place] at [facility]   PATIENT NAME: Stacey Durham    MR#:  [id number]  DATE OF BIRTH:  1946-02-24  SUBJECTIVE: Admitted for COPD exacerbation. Patient says she feels better but still has cough. Complains of history of fall at home recently and has headache on the right side but no other neurological problems.   CHIEF COMPLAINT:   Chief Complaint  Patient presents with  . Shortness of Breath    REVIEW OF SYSTEMS:   ROS CONSTITUTIONAL: No fever, fatigue or weakness.  EYES: No blurred or double vision.  EARS, NOSE, AND THROAT: No tinnitus or ear pain.  RESPIRATORY: cough, no shortness of breath, wheezing or hemoptysis.  CARDIOVASCULAR: No chest pain, orthopnea, edema.  GASTROINTESTINAL: No nausea, vomiting, diarrhea or abdominal pain.  GENITOURINARY: No dysuria, hematuria.  ENDOCRINE: No polyuria, nocturia,  HEMATOLOGY: No anemia, easy bruising or bleeding SKIN: No rash or lesion. MUSCULOSKELETAL: No joint pain or arthritis.   NEUROLOGIC: No tingling, numbness, weakness.  PSYCHIATRY: No anxiety or depression.   DRUG ALLERGIES:  No Known Allergies  VITALS:  Blood pressure (!) 116/53, pulse (!) 106, temperature 97.7 F (36.5 C), temperature source Oral, resp. rate 16, height 5\' 4"  (1.626 m), weight 67.9 kg (149 lb 11.2 oz), SpO2 93 %.  PHYSICAL EXAMINATION:  GENERAL:  71 y.o.-year-old patient lying in the bed with no acute distress.  EYES: Pupils equal, round, reactive to light and accommodation. No scleral icterus. Extraocular muscles intact.  HEENT: Head atraumatic, normocephalic. Oropharynx and nasopharynx clear.  NECK:  Supple, no jugular venous distention. No thyroid enlargement, no tenderness.  LUNGS: Normal breath sounds bilaterally, Bilateral expiratory wheeze in all lung fields.no, rales,rhonchi or crepitation. No use of accessory muscles of respiration.  CARDIOVASCULAR: S1, S2 normal. No murmurs, rubs, or gallops.   ABDOMEN: Soft, nontender, nondistended. Bowel sounds present. No organomegaly or mass.  EXTREMITIES: No pedal edema, cyanosis, or clubbing.  NEUROLOGIC: Cranial nerves II through XII are intact. Muscle strength 5/5 in all extremities. Sensation intact. Gait not checked.  PSYCHIATRIC: The patient is alert and oriented x 3.  SKIN: No obvious rash, lesion, or ulcer.    LABORATORY PANEL:   CBC  Recent Labs Lab 10/30/16 0504  WBC 10.0  HGB 13.3  HCT 38.7  PLT 246   ------------------------------------------------------------------------------------------------------------------  Chemistries   Recent Labs Lab 10/29/16 1424  NA 139  K 3.5  CL 105  CO2 26  GLUCOSE 114*  BUN 22*  CREATININE 0.99  CALCIUM 9.3   ------------------------------------------------------------------------------------------------------------------  Cardiac Enzymes  Recent Labs Lab 10/29/16 1424  TROPONINI <0.03   ------------------------------------------------------------------------------------------------------------------  RADIOLOGY:  Dg Chest 2 View  Result Date: 10/29/2016 CLINICAL DATA:  71 year old presenting with a 2 week history of shortness of breath. Former smoker with current history of COPD. EXAM: CHEST  2 VIEW COMPARISON:  CT chest 01/06/2016, screening CT chest 09/27/2015. Chest x-ray 11/22/2015, 08/16/2015 and earlier. FINDINGS: Cardiac silhouette upper normal in size, unchanged. Thoracic aorta atherosclerotic and mildly tortuous, unchanged. Hilar and mediastinal contours otherwise unremarkable. Stable calcified granuloma involving the left upper lobe. Emphysematous changes in the upper lobes, unchanged. Linear scarring in the lower lobes, unchanged. No new pulmonary parenchymal abnormalities. No pleural effusions. No pneumothorax. Degenerative changes involving the thoracic spine. IMPRESSION: COPD/emphysema. No acute cardiopulmonary disease. Stable scarring in the lower lobes and  stable calcified granuloma involving the left upper lobe. Electronically Signed   By: Hulan Saas M.D.   On: 10/29/2016 15:00  EKG:   Orders placed or performed during the hospital encounter of 10/29/16  . ED EKG  . ED EKG  . EKG 12-Lead  . EKG 12-Lead    ASSESSMENT AND PLAN:   #1 COPD exacerbation: Patient clinically still has lots of wheezing and dry cough. Continue IV steroids, nebulizers and empiric antibiotics for bronchitis. #2 headache and history of fall and bump her head on the right side still has tenderness on the right side and wants head CAT scan. Ordered . Likely discharge tomorrow morning.    All the records are reviewed and case discussed with Care Management/Social Workerr. Management plans discussed with the patient, family and they are in agreement.  CODE STATUS: full  TOTAL TIME TAKING CARE OF THIS PATIENT: 35minutes.   POSSIBLE D/C IN 1DAY, DEPENDING ON CLINICAL CONDITION.   Stacey Durham,Stacey Durham M.D on 10/30/2016 at 9:27 AM  Between 7am to 6pm - Pager - [phone]  After 6pm go to [email or web address] - password [email or web address]  [facility] [place] [facility]  Office  [phone]  CC: Primary care physician; Eustaquio BoydenGutierrez, Javier, MD   Note: This dictation was prepared with Dragon dictation along with smaller phrase technology. Any transcriptional errors that result from this process are unintentional.

## 2016-10-31 ENCOUNTER — Telehealth: Payer: Self-pay | Admitting: Family Medicine

## 2016-10-31 MED ORDER — PREDNISONE 10 MG PO TABS: ORAL_TABLET | ORAL | 0 refills | Status: DC

## 2016-10-31 NOTE — Telephone Encounter (Signed)
Pt seen last week with COPD exacerbation and head injury. Hospitalized, discharged over weekend.  Would call for f/u this week, ensure improving from COPD exac. I also saw she had reassuring CT scan. Would offer hosp f/u visit however if doing well no need for hosp f/u.

## 2016-10-31 NOTE — Discharge Summary (Signed)
[facility] - [place] at [facility]   PATIENT NAME: Stacey Durham    MR#:  [id number]  DATE OF BIRTH:  1946-04-19  DATE OF ADMISSION:  10/29/2016   ADMITTING PHYSICIAN: Adrian SaranSital Mody, MD  DATE OF DISCHARGE: 10/31/2016 11:33 AM  PRIMARY CARE PHYSICIAN: Eustaquio BoydenGutierrez, Javier, MD   ADMISSION DIAGNOSIS:  COPD exacerbation (HCC) [J44.1] DISCHARGE DIAGNOSIS:  Active Problems:   COPD exacerbation (HCC)  SECONDARY DIAGNOSIS:   Past Medical History:  Diagnosis Date  . Acute cholecystitis   . Cholecystitis with cholelithiasis 10/11/2015  . COPD (chronic obstructive pulmonary disease) (HCC)   . Ex-smoker    quit 2000s  . Pneumonia 11/26/2015   HOSPITAL COURSE:   #1 COPD exacerbation:  The patient has been treated with IV steroids, nebulizers and empiric antibiotics for bronchitis. Her symptoms has much improved. Change to by mouth prednisone taper.  #2 headache and history of fall and bump her head on the right side  Improved. CAT scan is unremarkable. Clinically stable. DISCHARGE CONDITIONS:  Stable, discharged to home today. CONSULTS OBTAINED:   DRUG ALLERGIES:  No Known Allergies DISCHARGE MEDICATIONS:   Allergies as of 10/31/2016   No Known Allergies     Medication List    TAKE these medications   albuterol (2.5 MG/3ML) 0.083% nebulizer solution Commonly known as:  PROVENTIL Take 3 mLs (2.5 mg total) by nebulization every 4 (four) hours as needed. Reported on 10/11/2015   PROAIR HFA 108 (90 Base) MCG/ACT inhaler Generic drug:  albuterol Inhale 2 puffs into the lungs every 4 (four) hours as needed.   Biotin 4098110000 MCG Tabs Take 1 tablet by mouth.   calcium-vitamin D 500-200 MG-UNIT tablet Commonly known as:  OSCAL WITH D Take 1 tablet by mouth.   FLUTTER Devi 1 each by Does not apply route once.   montelukast 10 MG tablet Commonly known as:  SINGULAIR Take 1 tablet (10 mg total) by mouth daily.   MULTIVITAMIN ADULT PO Take 1 tablet by  mouth.   predniSONE 10 MG tablet Commonly known as:  DELTASONE 40 mg po daily for 2 days, 20 mg po daily for 2 days, 10 mg po daily for 2 days. What changed:  medication strength  how much to take  how to take this  when to take this  additional instructions   PROBIOTIC-10 PO Take 1 tablet by mouth.   tiotropium 18 MCG inhalation capsule Commonly known as:  SPIRIVA Place 1 capsule (18 mcg total) into inhaler and inhale daily.   vitamin B-12 1000 MCG tablet Commonly known as:  CYANOCOBALAMIN Take 1,000 mcg by mouth daily.        DISCHARGE INSTRUCTIONS:  See AVS.  If you experience worsening of your admission symptoms, develop shortness of breath, life threatening emergency, suicidal or homicidal thoughts you must seek medical attention immediately by calling 911 or calling your MD immediately  if symptoms less severe.  You Must read complete instructions/literature along with all the possible adverse reactions/side effects for all the Medicines you take and that have been prescribed to you. Take any new Medicines after you have completely understood and accpet all the possible adverse reactions/side effects.   Please note  You were cared for by a hospitalist during your hospital stay. If you have any questions about your discharge medications or the care you received while you were in the hospital after you are discharged, you can call the unit and asked to speak with the hospitalist on call if  the hospitalist that took care of you is not available. Once you are discharged, your primary care physician will handle any further medical issues. Please note that NO REFILLS for any discharge medications will be authorized once you are discharged, as it is imperative that you return to your primary care physician (or establish a relationship with a primary care physician if you do not have one) for your aftercare needs so that they can reassess your need for medications and monitor  your lab values.    On the day of Discharge:  VITAL SIGNS:  Blood pressure (!) 121/54, pulse 84, temperature 98.3 F (36.8 C), temperature source Oral, resp. rate 20, height 5\' 4"  (1.626 m), weight 149 lb 11.2 oz (67.9 kg), SpO2 95 %. PHYSICAL EXAMINATION:  GENERAL:  71 y.o.-year-old patient lying in the bed with no acute distress.  EYES: Pupils equal, round, reactive to light and accommodation. No scleral icterus. Extraocular muscles intact.  HEENT: Head atraumatic, normocephalic. Oropharynx and nasopharynx clear.  NECK:  Supple, no jugular venous distention. No thyroid enlargement, no tenderness.  LUNGS: Normal breath sounds bilaterally, no wheezing, rales,rhonchi or crepitation. No use of accessory muscles of respiration.  CARDIOVASCULAR: S1, S2 normal. No murmurs, rubs, or gallops.  ABDOMEN: Soft, non-tender, non-distended. Bowel sounds present. No organomegaly or mass.  EXTREMITIES: No pedal edema, cyanosis, or clubbing.  NEUROLOGIC: Cranial nerves II through XII are intact. Muscle strength 5/5 in all extremities. Sensation intact. Gait not checked.  PSYCHIATRIC: The patient is alert and oriented x 3.  SKIN: No obvious rash, lesion, or ulcer.  DATA REVIEW:   CBC  Recent Labs Lab 10/30/16 0504  WBC 10.0  HGB 13.3  HCT 38.7  PLT 246    Chemistries   Recent Labs Lab 10/30/16 0504  NA 139  K 3.6  CL 109  CO2 21*  GLUCOSE 175*  BUN 20  CREATININE 1.19*  CALCIUM 8.8*     Microbiology Results  Results for orders placed or performed in visit on 01/28/16  WOUND CULTURE     Status: None   Collection Time: 01/28/16  5:00 PM  Result Value Ref Range Status   Gram Stain Abundant  Final   Gram Stain WBC present-predominately PMN  Final   Gram Stain No Squamous Epithelial Cells Seen  Final   Gram Stain No Organisms Seen  Final   Organism ID, Bacteria NO GROWTH 2 DAYS  Final    RADIOLOGY:  No results found.   Management plans discussed with the patient, family and  they are in agreement.  CODE STATUS: Prior   TOTAL TIME TAKING CARE OF THIS PATIENT: 32 minutes.    Shaune Pollack M.D on 10/31/2016 at 4:20 PM  Between 7am to 6pm - Pager - [phone]  After 6pm go to [email or web address] - Social research officer, government  [facility] [place] [facility]  Office  [phone]  CC: Primary care physician; Eustaquio Boyden, MD   Note: This dictation was prepared with Dragon dictation along with smaller phrase technology. Any transcriptional errors that result from this process are unintentional.

## 2016-10-31 NOTE — Progress Notes (Signed)
Discharge instructions given and went over with patient at bedside. Prescription reviewed. All questions answered. Patient to discharge home, awaiting transportation. Bo McclintockBrewer,Krisinda Giovanni S, RN

## 2016-10-31 NOTE — Progress Notes (Signed)
Pt's ride present for discharge; pt discharged via wheelchair by auxillary to the visitor's entrance 

## 2016-11-02 ENCOUNTER — Telehealth: Payer: Self-pay | Admitting: *Deleted

## 2016-11-02 NOTE — Telephone Encounter (Signed)
Pt on TCM list, also. LM on pts vm requesting call back

## 2016-11-02 NOTE — Telephone Encounter (Signed)
Lm requesting return call to complete TCM and confirm hosp f/u appt  

## 2016-11-09 ENCOUNTER — Ambulatory Visit (INDEPENDENT_AMBULATORY_CARE_PROVIDER_SITE_OTHER): Payer: Medicare Other | Admitting: Family Medicine

## 2016-11-09 ENCOUNTER — Encounter: Payer: Self-pay | Admitting: Family Medicine

## 2016-11-09 VITALS — BP 110/84 | HR 83 | Temp 98.2°F | Ht 64.0 in | Wt 144.5 lb

## 2016-11-09 DIAGNOSIS — J449 Chronic obstructive pulmonary disease, unspecified: Secondary | ICD-10-CM | POA: Diagnosis not present

## 2016-11-09 DIAGNOSIS — J441 Chronic obstructive pulmonary disease with (acute) exacerbation: Secondary | ICD-10-CM | POA: Diagnosis not present

## 2016-11-09 NOTE — Progress Notes (Signed)
BP 110/84   Pulse 83   Temp 98.2 F (36.8 C) (Oral)   Ht 5\' 4"  (1.626 m)   Wt 144 lb 8 oz (65.5 kg)   SpO2 94%   BMI 24.80 kg/m    CC: hosp f/u visit Subjective:    Patient ID: Stacey Durham, female    DOB: Mar 23, 1946, 71 y.o.   MRN: [id number]  HPI: Stacey Durham is a 71 y.o. female presenting on 11/09/2016 for Hospitalization Follow-up (Pt states she was recently hospitalized for COPD. She states she is much better at this point.)   Recent hospitalization for COPD exacerbation with acute respiratory distress, treated with IV steroids, repeated nebulizer treatments, and empiric abx for bronchitis. Discharged with 6 more days of prednisone taper. COPD trigger was weather change.   She has been doing better since discharge. She just mowed the yard today and feels well. She did not use a mask.   See prior note for details on recent head injury - she did undergo reassuring CT scan during hospitalization. Headache continues to improve.   Normal COPD regimen is singulair, spiriva, and PRN albuterol. She sees Dr Belia Heman pulmonologist. She has f/u scheduled for July.  UTD pneumonia shots.   DATE OF ADMISSION:  10/29/2016     DATE OF DISCHARGE: 10/31/2016  TCM hosp f/u phone call attempted: 11/02/2016  D/C Diagnosis: COPD exacerbation  Relevant past medical, surgical, family and social history reviewed and updated as indicated. Interim medical history since our last visit reviewed. Allergies and medications reviewed and updated. Outpatient Medications Prior to Visit  Medication Sig Dispense Refill  . albuterol (PROVENTIL) (2.5 MG/3ML) 0.083% nebulizer solution Take 3 mLs (2.5 mg total) by nebulization every 4 (four) hours as needed. Reported on 10/11/2015 125 mL 2  . Biotin 40981 MCG TABS Take 1 tablet by mouth.    . calcium-vitamin D (OSCAL WITH D) 500-200 MG-UNIT tablet Take 1 tablet by mouth.    . montelukast (SINGULAIR) 10 MG tablet Take 1 tablet (10 mg total) by mouth  daily. 90 tablet 3  . Multiple Vitamins-Minerals (MULTIVITAMIN ADULT PO) Take 1 tablet by mouth.    . predniSONE (DELTASONE) 10 MG tablet 40 mg po daily for 2 days, 20 mg po daily for 2 days, 10 mg po daily for 2 days. 14 tablet 0  . PROAIR HFA 108 (90 Base) MCG/ACT inhaler Inhale 2 puffs into the lungs every 4 (four) hours as needed. 1 Inhaler 3  . Probiotic Product (PROBIOTIC-10 PO) Take 1 tablet by mouth.    . Respiratory Therapy Supplies (FLUTTER) DEVI 1 each by Does not apply route once. 1 each 0  . tiotropium (SPIRIVA) 18 MCG inhalation capsule Place 1 capsule (18 mcg total) into inhaler and inhale daily. 30 capsule 6  . vitamin B-12 (CYANOCOBALAMIN) 1000 MCG tablet Take 1,000 mcg by mouth daily.    Marland Kitchen ipratropium-albuterol (DUONEB) 0.5-2.5 (3) MG/3ML nebulizer solution 3 mL      No facility-administered medications prior to visit.      Per HPI unless specifically indicated in ROS section below Review of Systems     Objective:    BP 110/84   Pulse 83   Temp 98.2 F (36.8 C) (Oral)   Ht 5\' 4"  (1.626 m)   Wt 144 lb 8 oz (65.5 kg)   SpO2 94%   BMI 24.80 kg/m   Wt Readings from Last 3 Encounters:  11/09/16 144 lb 8 oz (65.5 kg)  10/29/16 149 lb  11.2 oz (67.9 kg)  10/29/16 148 lb 8 oz (67.4 kg)    Physical Exam  Constitutional: She appears well-developed and well-nourished. No distress.  HENT:  Mouth/Throat: Oropharynx is clear and moist. No oropharyngeal exudate.  Eyes: Conjunctivae and EOM are normal. Pupils are equal, round, and reactive to light. No scleral icterus.  Neck: Normal range of motion. Neck supple.  Cardiovascular: Normal rate, regular rhythm, normal heart sounds and intact distal pulses.   No murmur heard. Pulmonary/Chest: Effort normal and breath sounds normal. No respiratory distress. She has no wheezes. She has no rales.  Good air movement, no wheezing appreciated  Musculoskeletal: She exhibits no edema.  Lymphadenopathy:    She has no cervical  adenopathy.  Skin: Skin is warm and dry. No rash noted.  Psychiatric: She has a normal mood and affect.  Nursing note and vitals reviewed.     Assessment & Plan:   Problem List Items Addressed This Visit    Chronic obstructive pulmonary disease (HCC)    Chronic, stable. Continue singulair, spiriva, albuterol PRN. She has f/u planned with pulm next month.       COPD exacerbation (HCC) - Primary    Recent trigger was weather change. Discussed trigger avoidance, allergen avoidance, and avoidance of heat/humidity to help keep COPD under control.  Has recovered well after hospitalization. Reviewed respiratory regimen. rec keep f/u with pulm.           Follow up plan: Return in about 8 months (around 06/26/2017) for annual exam, prior fasting for blood work.  Stacey BoydenJavier Fawnda Vitullo, MD

## 2016-11-09 NOTE — Assessment & Plan Note (Signed)
Recent trigger was weather change. Discussed trigger avoidance, allergen avoidance, and avoidance of heat/humidity to help keep COPD under control.  Has recovered well after hospitalization. Reviewed respiratory regimen. rec keep f/u with pulm.

## 2016-11-09 NOTE — Assessment & Plan Note (Signed)
Chronic, stable. Continue singulair, spiriva, albuterol PRN. She has f/u planned with pulm next month.

## 2016-11-09 NOTE — Patient Instructions (Addendum)
You are doing well today. Continue current medicines. I will let Dr Belia HemanKasa know about recent hospitalization.  Return as needed or after 06/26/2017 for next physical.

## 2016-11-26 ENCOUNTER — Telehealth: Payer: Self-pay | Admitting: Internal Medicine

## 2016-11-26 NOTE — Telephone Encounter (Signed)
Returned call to patient. She was seen by her pcp on 11/09/16 for hosp f/u. She was in no distress. No active sx asked patient if she was wheezing, sob etc. Patient denies all sx. Informed patient Dr. Belia HemanKasa does not have any earlier appointment. If she declines please call office back for acute appointment.

## 2016-11-26 NOTE — Telephone Encounter (Signed)
Pt states he COPD is getting worse. She thinks she may need a medication change. Pt has an appt scheduled for the end of July.

## 2016-11-30 DIAGNOSIS — J449 Chronic obstructive pulmonary disease, unspecified: Secondary | ICD-10-CM | POA: Diagnosis not present

## 2016-12-09 ENCOUNTER — Other Ambulatory Visit: Payer: Self-pay | Admitting: Internal Medicine

## 2016-12-09 MED ORDER — PROAIR HFA 108 (90 BASE) MCG/ACT IN AERS: 2.0000 | INHALATION_SPRAY | RESPIRATORY_TRACT | 2 refills | Status: DC | PRN

## 2016-12-09 MED ORDER — ALBUTEROL SULFATE (2.5 MG/3ML) 0.083% IN NEBU: 2.5000 mg | INHALATION_SOLUTION | RESPIRATORY_TRACT | 2 refills | Status: DC | PRN

## 2016-12-24 ENCOUNTER — Ambulatory Visit (INDEPENDENT_AMBULATORY_CARE_PROVIDER_SITE_OTHER): Payer: Medicare Other | Admitting: Internal Medicine

## 2016-12-24 ENCOUNTER — Encounter: Payer: Self-pay | Admitting: Internal Medicine

## 2016-12-24 VITALS — BP 114/72 | HR 79 | Resp 16 | Ht 64.0 in | Wt 148.0 lb

## 2016-12-24 DIAGNOSIS — J441 Chronic obstructive pulmonary disease with (acute) exacerbation: Secondary | ICD-10-CM

## 2016-12-24 MED ORDER — MOMETASONE FURO-FORMOTEROL FUM 200-5 MCG/ACT IN AERO: 2.0000 | INHALATION_SPRAY | Freq: Two times a day (BID) | RESPIRATORY_TRACT | 0 refills | Status: DC

## 2016-12-24 MED ORDER — MOMETASONE FURO-FORMOTEROL FUM 200-5 MCG/ACT IN AERO: 2.0000 | INHALATION_SPRAY | Freq: Two times a day (BID) | RESPIRATORY_TRACT | 5 refills | Status: DC

## 2016-12-24 MED ORDER — MOMETASONE FURO-FORMOTEROL FUM 200-5 MCG/ACT IN AERO: 2.0000 | INHALATION_SPRAY | Freq: Two times a day (BID) | RESPIRATORY_TRACT | Status: DC

## 2016-12-24 MED ORDER — PREDNISONE 20 MG PO TABS
40.0000 mg | ORAL_TABLET | Freq: Every day | ORAL | 0 refills | Status: DC
Start: 2016-12-24 — End: 2017-03-15

## 2016-12-24 NOTE — Patient Instructions (Signed)
Start prednisone 40 mg daily for 10 days Start DULERA inhaler

## 2016-12-24 NOTE — Addendum Note (Signed)
Addended by: Alease FrameARTER, Tylicia Sherman S on: 12/24/2016 11:09 AM   Modules accepted: Orders

## 2016-12-24 NOTE — Progress Notes (Signed)
[facility] [place]     Date: 12/24/2016,   MRN# [id number] Lyda PeroneJoanne L Galanti [birth year] Code Status:  Code Status History    Date Active Date Inactive Code Status Order ID Comments User Context   08/16/2015 11:33 AM 08/21/2015  7:28 PM Full Code [id number]  Adrian SaranSital Mody, MD Inpatient          AdmissionWeight: 148 lb (67.1 kg)                 CurrentWeight: 148 lb (67.1 kg)     CHIEF COMPLAINT:   Follow up COPD   HISTORY OF PRESENT ILLNESS   patient has chronic SOB, chronic DOE  presents to  office with bilateral  Wheezing on spiriva,  Using albuterol nebs as needed  patient was admitted to the hospital in May  hospitalized for 3 days    no signs of infection at this time  no signs of CHF at this time  patient has mild COPD exacerbation at this   Current Medication:   Current Outpatient Prescriptions:  .  albuterol (PROVENTIL) (2.5 MG/3ML) 0.083% nebulizer solution, Take 3 mLs (2.5 mg total) by nebulization every 4 (four) hours as needed. Reported on 10/11/2015, Disp: 125 mL, Rfl: 2 .  Biotin [id number] MCG TABS, Take 1 tablet by mouth., Disp: , Rfl:  .  calcium-vitamin D (OSCAL WITH D) 500-200 MG-UNIT tablet, Take 1 tablet by mouth., Disp: , Rfl:  .  montelukast (SINGULAIR) 10 MG tablet, Take 1 tablet (10 mg total) by mouth daily., Disp: 90 tablet, Rfl: 3 .  Multiple Vitamins-Minerals (MULTIVITAMIN ADULT PO), Take 1 tablet by mouth., Disp: , Rfl:  .  PROAIR HFA 108 (90 Base) MCG/ACT inhaler, Inhale 2 puffs into the lungs every 4 (four) hours as needed., Disp: 1 Inhaler, Rfl: 2 .  Probiotic Product (PROBIOTIC-10 PO), Take 1 tablet by mouth., Disp: , Rfl:  .  Respiratory Therapy Supplies (FLUTTER) DEVI, 1 each by Does not apply route once., Disp: 1 each, Rfl: 0 .  tiotropium (SPIRIVA) 18 MCG inhalation capsule, Place 1 capsule (18 mcg total) into inhaler and inhale daily., Disp: 30 capsule, Rfl: 6 .  vitamin B-12 (CYANOCOBALAMIN) 1000 MCG tablet, Take  1,000 mcg by mouth daily., Disp: , Rfl:      ALLERGIES   Patient has no known allergies.     REVIEW OF SYSTEMS   Review of Systems  Constitutional: Negative for chills, fever, malaise/fatigue and weight loss.  HENT: Positive for congestion.   Respiratory: Positive for shortness of breath and wheezing. Negative for cough, hemoptysis and sputum production.        Chronic SOB/DOE at baseline  Cardiovascular: Negative for chest pain, palpitations, orthopnea and leg swelling.  All other systems reviewed and are negative.    VS: BP 114/72 (BP Location: Left Arm, Cuff Size: Normal)   Pulse 79   Resp 16   Ht 5\' 4"  (1.626 m)   Wt 148 lb (67.1 kg)   SpO2 93%   BMI 25.40 kg/m      PHYSICAL EXAM   Physical Exam  Constitutional: She is oriented to person, place, and time. She appears well-developed and well-nourished.  HENT:  Head: Normocephalic and atraumatic.  Mouth/Throat: No oropharyngeal exudate.  Eyes: EOM are normal. No scleral icterus.  Cardiovascular: Normal rate, regular rhythm and normal heart sounds.   No murmur heard. Pulmonary/Chest: Effort normal and breath sounds normal. No stridor. No respiratory distress. She has no wheezes. She has no rales.  Musculoskeletal: Normal range of motion. She exhibits no edema.  Neurological: She is alert and oriented to person, place, and time.  Skin: Skin is warm.  Psychiatric: She has a normal mood and affect.   CT chest Lung cancer Screening: 08/2015 No nodules/massess PFT 08/2015 Ratio 52% fev1 1.1 46%  fvc 2.1 67%  6MWT WNL ONO WNL     ASSESSMENT/PLAN   71 yo white female with Severe COPD based on Pulmonary function with Gold Stage B with h/o extensive smoking history  With recent admission to the hospital for COPD exacerbation now with persistent wheezing,  I have advised to receive a breathing treatment in the office but patient refused at this time  Chronic obstructive pulmonary disease (HCC) - severe Gold  COPD B -continueSpiriva -albuterol as needed  add  Dulera to her regimen -recommend avoid smoke exposure  COPD exacerbation  start prednisone 40 mg daily for 10 days  Deconditioned state  recommend exercise as tolerated  Follow up in 3 months  Patient  satisfied with Plan of action and management. All questions answered  Lucie LeatherKurian David Zyrah Wiswell, M.D.  [facility]  Medical Director [facility] Medical Director [facility]io-P[facility]

## 2016-12-31 ENCOUNTER — Telehealth: Payer: Self-pay | Admitting: *Deleted

## 2016-12-31 DIAGNOSIS — J449 Chronic obstructive pulmonary disease, unspecified: Secondary | ICD-10-CM | POA: Diagnosis not present

## 2016-12-31 DIAGNOSIS — Z122 Encounter for screening for malignant neoplasm of respiratory organs: Secondary | ICD-10-CM

## 2016-12-31 NOTE — Telephone Encounter (Signed)
Notified patient that annual lung cancer screening low dose CT scan is due currently or will be in near future. Confirmed that patient is within the age range of 55-77, and asymptomatic, (no signs or symptoms of lung cancer). Patient denies illness that would prevent curative treatment for lung cancer if found. Verified smoking history, (former, quit 2003, 40 pack year). The shared decision making visit was done 09/27/15. Patient is agreeable for CT scan being scheduled.

## 2017-01-15 ENCOUNTER — Ambulatory Visit: Payer: Medicare Other

## 2017-01-25 ENCOUNTER — Ambulatory Visit
Admission: RE | Admit: 2017-01-25 | Discharge: 2017-01-25 | Disposition: A | Discharge status: Home / Self Care | Payer: Medicare Other | Source: Ambulatory Visit | Attending: Oncology | Admitting: Oncology

## 2017-01-25 DIAGNOSIS — J432 Centrilobular emphysema: Secondary | ICD-10-CM | POA: Insufficient documentation

## 2017-01-25 DIAGNOSIS — Z122 Encounter for screening for malignant neoplasm of respiratory organs: Secondary | ICD-10-CM | POA: Insufficient documentation

## 2017-01-25 DIAGNOSIS — R918 Other nonspecific abnormal finding of lung field: Secondary | ICD-10-CM | POA: Diagnosis not present

## 2017-01-25 DIAGNOSIS — Z87891 Personal history of nicotine dependence: Secondary | ICD-10-CM | POA: Diagnosis not present

## 2017-01-25 DIAGNOSIS — I700 Atherosclerosis of aorta: Secondary | ICD-10-CM | POA: Insufficient documentation

## 2017-01-27 ENCOUNTER — Ambulatory Visit: Payer: Medicare Other

## 2017-01-30 ENCOUNTER — Encounter: Payer: Self-pay | Admitting: Family Medicine

## 2017-01-30 DIAGNOSIS — M542 Cervicalgia: Secondary | ICD-10-CM | POA: Insufficient documentation

## 2017-02-02 ENCOUNTER — Encounter: Payer: Self-pay | Admitting: *Deleted

## 2017-02-02 ENCOUNTER — Encounter: Payer: Self-pay | Admitting: Family Medicine

## 2017-02-02 DIAGNOSIS — R911 Solitary pulmonary nodule: Secondary | ICD-10-CM | POA: Insufficient documentation

## 2017-03-15 ENCOUNTER — Ambulatory Visit (INDEPENDENT_AMBULATORY_CARE_PROVIDER_SITE_OTHER): Payer: Medicare Other | Admitting: Internal Medicine

## 2017-03-15 ENCOUNTER — Encounter: Payer: Self-pay | Admitting: Internal Medicine

## 2017-03-15 VITALS — BP 128/68 | HR 78 | Ht 64.0 in | Wt 144.0 lb

## 2017-03-15 DIAGNOSIS — J449 Chronic obstructive pulmonary disease, unspecified: Secondary | ICD-10-CM | POA: Diagnosis not present

## 2017-03-15 NOTE — Progress Notes (Signed)
[facility] [place] Pulmonary Medicine Consultation     Date: 03/15/2017,   MRN# [id number] Stacey Durham 1945/11/30 Code Status:  Code Status History    Date Active Date Inactive Code Status Order ID Comments User Context   08/16/2015 11:33 AM 08/21/2015  7:28 PM Full Code [id number]  Adrian Saran, MD Inpatient          AdmissionWeight: 144 lb (65.3 kg)                 CurrentWeight: 144 lb (65.3 kg)     CHIEF COMPLAINT:   Follow up COPD   HISTORY OF PRESENT ILLNESS   patient has chronic SOB, chronic DOE on spiriva,  Using albuterol  as needed Overall patient has no signs of symptoms of infection and doing well Patient exercises 3 times per week, approximately 1 hour per session along with the walking several miles per day 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  albuterol (PROVENTIL) (2.5 MG/3ML) 0.083% nebulizer solution, Take 3 mLs (2.5 mg total) by nebulization every 4 (four) hours as needed. Reported on 10/11/2015, Disp: 125 mL, Rfl: 2 .  Biotin 86578 MCG TABS, Take 1 tablet by mouth., Disp: , Rfl:  .  calcium-vitamin D (OSCAL WITH D) 500-200 MG-UNIT tablet, Take 1 tablet by mouth., Disp: , Rfl:  .  mometasone-formoterol (DULERA) 200-5 MCG/ACT AERO, Inhale 2 puffs into the lungs 2 (two) times daily., Disp: 1 Inhaler, Rfl: 0 .  montelukast (SINGULAIR) 10 MG tablet, Take 1 tablet (10 mg total) by mouth daily., Disp: 90 tablet, Rfl: 3 .  Multiple Vitamins-Minerals (MULTIVITAMIN ADULT PO), Take 1 tablet by mouth., Disp: , Rfl:  .  PROAIR HFA 108 (90 Base) MCG/ACT inhaler, Inhale 2 puffs into the lungs every 4 (four) hours as needed., Disp: 1 Inhaler, Rfl: 2 .  Probiotic Product (PROBIOTIC-10 PO), Take 1 tablet by mouth., Disp: , Rfl:  .  Respiratory Therapy Supplies (FLUTTER)  DEVI, 1 each by Does not apply route once., Disp: 1 each, Rfl: 0 .  tiotropium (SPIRIVA) 18 MCG inhalation capsule, Place 1 capsule (18 mcg total) into inhaler and inhale daily., Disp: 30 capsule, Rfl: 6 .  vitamin B-12 (CYANOCOBALAMIN) 1000 MCG tablet, Take 1,000 mcg by mouth daily., Disp: , Rfl:      ALLERGIES   Patient has no known allergies.     REVIEW OF SYSTEMS   Review of Systems  Constitutional: Negative for chills, fever, malaise/fatigue and weight loss.  HENT: Negative for congestion.   Respiratory: Positive for shortness of breath. Negative for cough, hemoptysis, sputum production and wheezing.        Chronic SOB/DOE at baseline  Cardiovascular: Negative for chest pain, palpitations, orthopnea and leg swelling.  All other systems reviewed and are negative.    VS: BP 128/68 (BP Location: Left Arm, Cuff Size: Normal)   Pulse 78   Ht  (1.626 m)   Wt 144 lb (65.3 kg)   SpO2 96%   BMI 24.72 kg/m      PHYSICAL EXAM   Physical Exam  Constitutional: She is oriented to person, place, and time. She appears well-developed and well-nourished.  HENT:  Mouth/Throat: No oropharyngeal exudate.  Eyes: EOM are normal. No scleral icterus.  Cardiovascular: Normal  rate, regular rhythm and normal heart sounds.   No murmur heard. Pulmonary/Chest: Effort normal and breath sounds normal. No stridor. No respiratory distress. She has no wheezes. She has no rales.  Musculoskeletal: Normal range of motion. She exhibits no edema.  Neurological: She is alert and oriented to person, place, and time.  Skin: Skin is warm.  Psychiatric: She has a normal mood and affect.   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 All questions answered  Lucie Leather, M.D.  [name]  Medical Director [facility] Medical Director [facility] Cardio-Pulmonary Department

## 2017-03-15 NOTE — Patient Instructions (Signed)
Will hold all inhalers except for albuterol Use albuterol as needed and prior to exercise

## 2017-03-26 ENCOUNTER — Ambulatory Visit: Payer: Medicare Other | Admitting: Internal Medicine

## 2017-06-02 ENCOUNTER — Ambulatory Visit: Payer: Self-pay | Admitting: Hematology

## 2017-06-02 NOTE — Telephone Encounter (Signed)
Called patient back to triage her per Agent request.  Per protocol patient needs appointment.  Appointment made for tomorrow 06/03/2017.  Home care instructions given to patient. Reason for Disposition . SEVERE coughing spells (e.g., whooping sound after coughing, vomiting after coughing)  Answer Assessment - Initial Assessment Questions 1. ONSET: "When did the cough begin?"      Over the weekend 2. SEVERITY: "How bad is the cough today?"    severe 3. RESPIRATORY DISTRESS: "Describe your breathing."      Denies difficulty breathing  5. SPUTUM: "Describe the color of your sputum" (e.g., clear, white, yellow, green), "Has there been any change recently?"    No able to cough anything up  7. CARDIAC HISTORY: "Do you have any history of heart disease?" (e.g., heart attack, congestive heart failure)    No 8. LUNG HISTORY: "Do you have any history of lung disease?"  (e.g., pulmonary embolus, asthma, emphysema/COPD)    HX: Copd 9. OTHER SYMPTOMS: "Do you have any other symptoms? (e.g., runny nose, wheezing, chest pain)     Runny nose  Protocols used: COUGH - CHRONIC-A-AH

## 2017-06-03 ENCOUNTER — Ambulatory Visit (INDEPENDENT_AMBULATORY_CARE_PROVIDER_SITE_OTHER): Payer: Medicare HMO | Admitting: Family Medicine

## 2017-06-03 ENCOUNTER — Encounter: Payer: Self-pay | Admitting: Family Medicine

## 2017-06-03 VITALS — BP 116/70 | HR 82 | Temp 97.9°F | Wt 143.0 lb

## 2017-06-03 DIAGNOSIS — J449 Chronic obstructive pulmonary disease, unspecified: Secondary | ICD-10-CM

## 2017-06-03 DIAGNOSIS — Z87891 Personal history of nicotine dependence: Secondary | ICD-10-CM

## 2017-06-03 DIAGNOSIS — J441 Chronic obstructive pulmonary disease with (acute) exacerbation: Secondary | ICD-10-CM | POA: Diagnosis not present

## 2017-06-03 MED ORDER — PREDNISONE 20 MG PO TABS: ORAL_TABLET | ORAL | 0 refills | Status: DC

## 2017-06-03 MED ORDER — DOXYCYCLINE HYCLATE 100 MG PO TABS: 100.0000 mg | ORAL_TABLET | Freq: Two times a day (BID) | ORAL | 0 refills | Status: DC

## 2017-06-03 NOTE — Assessment & Plan Note (Signed)
On singulair daily. Now off spiriva and dulera. Regularly exercising at gym.

## 2017-06-03 NOTE — Patient Instructions (Signed)
I think you have COPD flare - treat with prednisone course and doxycycline antibiotic.  Continue scheduled albuterol nebulizer three times a day and as needed for next few days.  Let us know or return if worsening fever 101, productive cough, shortness of breath or not improving with treatment.

## 2017-06-03 NOTE — Assessment & Plan Note (Addendum)
Last flare was in summer.  Anticipate acute exacerbation of COPD - treat with doxy/prednisone course.  Red flags to seek further care reviewed. Pt declines breathing treatment in office today.

## 2017-06-03 NOTE — Progress Notes (Signed)
BP 116/70 (BP Location: Left Arm, Patient Position: Sitting, Cuff Size: Normal)   Pulse 82   Temp 97.9 F (36.6 C) (Oral)   Wt 143 lb (64.9 kg)   SpO2 95%   BMI 24.55 kg/m    CC: cough Subjective:    Patient ID: MONNIE GUDGEL, female    DOB: 1946-03-24, 72 y.o.   MRN: [id number]  HPI: YOSHIYE KRAFT is a 72 y.o. female presenting on 06/03/2017 for Cough (productive with clear mucous. Started 05/28/17. Taking Alka Seltzer Plus)   1 wk h/o productive cough of clear phlegm, wheezing, rhinorrhea, head congestion and sore throat. Increased dyspnea. No fevers/chills, ear or tooth pain, HA.   She has been taking albuterol nebulizer three times a day.  No sick contacts at home.  Known COPD (stage A), ex smoker quit 2000s.  Sees pulm Dr Belia Heman - latest stopped spiriva and dulera. She continues PRN albuterol.  She goes to gym daily.   Relevant past medical, surgical, family and social history reviewed and updated as indicated. Interim medical history since our last visit reviewed. Allergies and medications reviewed and updated. Outpatient Medications Prior to Visit  Medication Sig Dispense Refill  . albuterol (PROVENTIL) (2.5 MG/3ML) 0.083% nebulizer solution Take 3 mLs (2.5 mg total) by nebulization every 4 (four) hours as needed. Reported on 10/11/2015 125 mL 2  . Biotin 11914 MCG TABS Take 1 tablet by mouth.    . calcium-vitamin D (OSCAL WITH D) 500-200 MG-UNIT tablet Take 1 tablet by mouth.    . montelukast (SINGULAIR) 10 MG tablet Take 1 tablet (10 mg total) by mouth daily. 90 tablet 3  . Multiple Vitamins-Minerals (MULTIVITAMIN ADULT PO) Take 1 tablet by mouth.    Marland Kitchen PROAIR HFA 108 (90 Base) MCG/ACT inhaler Inhale 2 puffs into the lungs every 4 (four) hours as needed. 1 Inhaler 2  . Probiotic Product (PROBIOTIC-10 PO) Take 1 tablet by mouth.    . Respiratory Therapy Supplies (FLUTTER) DEVI 1 each by Does not apply route once. 1 each 0  . vitamin B-12 (CYANOCOBALAMIN) 1000 MCG  tablet Take 1,000 mcg by mouth daily.    . mometasone-formoterol (DULERA) 200-5 MCG/ACT AERO Inhale 2 puffs into the lungs 2 (two) times daily. 1 Inhaler 0  . tiotropium (SPIRIVA) 18 MCG inhalation capsule Place 1 capsule (18 mcg total) into inhaler and inhale daily. 30 capsule 6   No facility-administered medications prior to visit.      Per HPI unless specifically indicated in ROS section below Review of Systems     Objective:    BP 116/70 (BP Location: Left Arm, Patient Position: Sitting, Cuff Size: Normal)   Pulse 82   Temp 97.9 F (36.6 C) (Oral)   Wt 143 lb (64.9 kg)   SpO2 95%   BMI 24.55 kg/m   Wt Readings from Last 3 Encounters:  06/03/17 143 lb (64.9 kg)  03/15/17 144 lb (65.3 kg)  01/25/17 148 lb (67.1 kg)    Physical Exam  Constitutional: She appears well-developed and well-nourished. No distress.  HENT:  Head: Normocephalic and atraumatic.  Right Ear: Hearing, tympanic membrane, external ear and ear canal normal.  Left Ear: Hearing, tympanic membrane, external ear and ear canal normal.  Nose: No mucosal edema or rhinorrhea. Right sinus exhibits no maxillary sinus tenderness and no frontal sinus tenderness. Left sinus exhibits no maxillary sinus tenderness and no frontal sinus tenderness.  Mouth/Throat: Uvula is midline, oropharynx is clear and moist and mucous membranes are  normal. No oropharyngeal exudate, posterior oropharyngeal edema, posterior oropharyngeal erythema or tonsillar abscesses.  Eyes: Conjunctivae and EOM are normal. Pupils are equal, round, and reactive to light. No scleral icterus.  Neck: Normal range of motion. Neck supple.  Cardiovascular: Normal rate, regular rhythm, normal heart sounds and intact distal pulses.  No murmur heard. Pulmonary/Chest: Effort normal. No respiratory distress. She has no decreased breath sounds. She has wheezes (faint exp). She has rhonchi (throughout). She has no rales.  Lymphadenopathy:    She has no cervical  adenopathy.  Skin: Skin is warm and dry. No rash noted.  Nursing note and vitals reviewed.      Assessment & Plan:   Problem List Items Addressed This Visit    Chronic obstructive pulmonary disease (HCC)    On singulair daily. Now off spiriva and dulera. Regularly exercising at gym.       Relevant Medications   predniSONE (DELTASONE) 20 MG tablet   COPD exacerbation (HCC) - Primary    Last flare was in summer.  Anticipate acute exacerbation of COPD - treat with doxy/prednisone course.  Red flags to seek further care reviewed. Pt declines breathing treatment in office today.       Relevant Medications   predniSONE (DELTASONE) 20 MG tablet   Ex-smoker       Follow up plan: Return if symptoms worsen or fail to improve.  Eustaquio BoydenJavier Kem Parcher, MD

## 2017-06-10 ENCOUNTER — Other Ambulatory Visit: Payer: Self-pay | Admitting: Family Medicine

## 2017-06-10 DIAGNOSIS — Z139 Encounter for screening, unspecified: Secondary | ICD-10-CM

## 2017-06-24 ENCOUNTER — Ambulatory Visit (INDEPENDENT_AMBULATORY_CARE_PROVIDER_SITE_OTHER): Payer: Medicare HMO | Admitting: *Deleted

## 2017-06-24 DIAGNOSIS — Z23 Encounter for immunization: Secondary | ICD-10-CM

## 2017-06-28 DIAGNOSIS — R69 Illness, unspecified: Secondary | ICD-10-CM | POA: Diagnosis not present

## 2017-07-11 ENCOUNTER — Other Ambulatory Visit: Payer: Self-pay | Admitting: Family Medicine

## 2017-07-11 DIAGNOSIS — R7303 Prediabetes: Secondary | ICD-10-CM

## 2017-07-11 DIAGNOSIS — R7989 Other specified abnormal findings of blood chemistry: Secondary | ICD-10-CM

## 2017-07-11 DIAGNOSIS — E785 Hyperlipidemia, unspecified: Secondary | ICD-10-CM

## 2017-07-14 ENCOUNTER — Ambulatory Visit (INDEPENDENT_AMBULATORY_CARE_PROVIDER_SITE_OTHER): Payer: Medicare HMO

## 2017-07-14 VITALS — BP 102/74 | HR 68 | Temp 97.5°F | Ht 64.0 in | Wt 143.5 lb

## 2017-07-14 DIAGNOSIS — E785 Hyperlipidemia, unspecified: Secondary | ICD-10-CM

## 2017-07-14 DIAGNOSIS — R7989 Other specified abnormal findings of blood chemistry: Secondary | ICD-10-CM | POA: Diagnosis not present

## 2017-07-14 DIAGNOSIS — Z0000 Encounter for general adult medical examination without abnormal findings: Secondary | ICD-10-CM | POA: Diagnosis not present

## 2017-07-14 DIAGNOSIS — R7303 Prediabetes: Secondary | ICD-10-CM

## 2017-07-14 LAB — BASIC METABOLIC PANEL
BUN: 23 mg/dL (ref 6–23)
CALCIUM: 9.2 mg/dL (ref 8.4–10.5)
CO2: 28 meq/L (ref 19–32)
CREATININE: 1.01 mg/dL (ref 0.40–1.20)
Chloride: 104 mEq/L (ref 96–112)
GFR: 57.17 mL/min — ABNORMAL LOW (ref >60.00)
Glucose, Bld: 121 mg/dL — ABNORMAL HIGH (ref 70–99)
Potassium: 4.2 mEq/L (ref 3.5–5.1)
Sodium: 139 mEq/L (ref 135–145)

## 2017-07-14 LAB — LIPID PANEL
CHOLESTEROL: 216 mg/dL — AB (ref 0–200)
HDL: 56 mg/dL (ref >39.00)
NonHDL: 159.81
Total CHOL/HDL Ratio: 4
Triglycerides: 215 mg/dL — ABNORMAL HIGH (ref 0.0–149.0)
VLDL: 43 mg/dL — AB (ref 0.0–40.0)

## 2017-07-14 LAB — TSH: TSH: 5.29 u[IU]/mL — ABNORMAL HIGH (ref 0.35–4.50)

## 2017-07-14 LAB — HEMOGLOBIN A1C: HEMOGLOBIN A1C: 6.1 % (ref 4.6–6.5)

## 2017-07-14 LAB — LDL CHOLESTEROL, DIRECT: LDL DIRECT: 132 mg/dL

## 2017-07-14 NOTE — Progress Notes (Signed)
   Subjective:    Patient ID: Stacey PeroneJoanne L Berkovich, female    DOB: Jul 09, 1945, 72 y.o.   MRN: [id number]  HPI I reviewed health advisor's note, was available for consultation, and agree with documentation and plan.    Review of Systems     Objective:   Physical Exam        Assessment & Plan:

## 2017-07-14 NOTE — Progress Notes (Signed)
Pre visit review using our clinic review tool, if applicable. No additional management support is needed unless otherwise documented below in the visit note. 

## 2017-07-14 NOTE — Patient Instructions (Signed)
[name] , Thank you for taking time to come for your Medicare Wellness Visit. I appreciate your ongoing commitment to your health goals. Please review the following plan we discussed and let me know if I can assist you in the future.   These are the goals we discussed: Goals    . Increase physical activity     Starting 07/14/2017, I will continue to exercise for 45 minutes 3 days per week.        This is a list of the screening recommended for you and due dates:  Health Maintenance  Topic Date Due  . Mammogram  07/15/2018  . Colon Cancer Screening  08/12/2021  . DTaP/Tdap/Td vaccine (2 - Td) 06/23/2022  . Tetanus Vaccine  06/23/2022  . Flu Shot  Completed  . DEXA scan (bone density measurement)  Completed  .  Hepatitis C: One time screening is recommended by Center for Disease Control  (CDC) for  adults born from [birth year] through 1965.   Completed  . Pneumonia vaccines  Completed   Preventive Care for Adults  A healthy lifestyle and preventive care can promote health and wellness. Preventive health guidelines for adults include the following key practices.  . A routine yearly physical is a good way to check with your health care provider about your health and preventive screening. It is a chance to share any concerns and updates on your health and to receive a thorough exam.  . Visit your dentist for a routine exam and preventive care every 6 months. Brush your teeth twice a day and floss once a day. Good oral hygiene prevents tooth decay and gum disease.  . The frequency of eye exams is based on your age, health, family medical history, use  of contact lenses, and other factors. Follow your health care provider's recommendations for frequency of eye exams.  . Eat a healthy diet. Foods like vegetables, fruits, whole grains, low-fat dairy products, and lean protein foods contain the nutrients you need without too many calories. Decrease your intake of foods high in solid fats,  added sugars, and salt. Eat the right amount of calories for you. Get information about a proper diet from your health care provider, if necessary.  . Regular physical exercise is one of the most important things you can do for your health. Most adults should get at least 150 minutes of moderate-intensity exercise (any activity that increases your heart rate and causes you to sweat) each week. In addition, most adults need muscle-strengthening exercises on 2 or more days a week.  Silver Sneakers may be a benefit available to you. To determine eligibility, you may visit the website: www.silversneakers.com or contact program at 503 [phone] Mon-Fri between 8AM-8PM.   . Maintain a healthy weight. The body mass index (BMI) is a screening tool to identify possible weight problems. It provides an estimate of body fat based on height and weight. Your health care provider can find your BMI and can help you achieve or maintain a healthy weight.   For adults 20 years and older: ? A BMI below 18.5 is considered underweight. ? A BMI of 18.5 to 24.9 is normal. ? A BMI of 25 to 29.9 is considered overweight. ? A BMI of 30 and above is considered obese.   . Maintain normal blood lipids and cholesterol levels by exercising and minimizing your intake of saturated fat. Eat a balanced diet with plenty of fruit and vegetables. Blood tests for lipids and cholesterol should begin at  age 41 and be repeated every 5 years. If your lipid or cholesterol levels are high, you are over 50, or you are at high risk for heart disease, you may need your cholesterol levels checked more frequently. Ongoing high lipid and cholesterol levels should be treated with medicines if diet and exercise are not working.  . If you smoke, find out from your health care provider how to quit. If you do not use tobacco, please do not start.  . If you choose to drink alcohol, please do not consume more than 2 drinks per day. One drink is  considered to be 12 ounces (355 mL) of beer, 5 ounces (148 mL) of wine, or 1.5 ounces (44 mL) of liquor.  . If you are 61-37 years old, ask your health care provider if you should take aspirin to prevent strokes.  . Use sunscreen. Apply sunscreen liberally and repeatedly throughout the day. You should seek shade when your shadow is shorter than you. Protect yourself by wearing long sleeves, pants, a wide-brimmed hat, and sunglasses year round, whenever you are outdoors.  . Once a month, do a whole body skin exam, using a mirror to look at the skin on your back. Tell your health care provider of new moles, moles that have irregular borders, moles that are larger than a pencil eraser, or moles that have changed in shape or color.

## 2017-07-14 NOTE — Progress Notes (Signed)
PCP notes:   Health maintenance:  No gaps identified.   Abnormal screenings:   Hearing - failed  Hearing Screening   125Hz  250Hz  500Hz  1000Hz  2000Hz  3000Hz  4000Hz  6000Hz  8000Hz   Right ear:   0 0 40  0    Left ear:   40 0 40  0      Patient concerns:   None   Nurse concerns:  None  Next PCP appt:   07/21/17 @1130 

## 2017-07-14 NOTE — Progress Notes (Signed)
Subjective:   Stacey Durham is a 72 y.o. female who presents for Medicare Annual (Subsequent) preventive examination.  Review of Systems:  N/A Cardiac Risk Factors include: advanced age (>8355men, 57>65 women);dyslipidemia     Objective:     Vitals: BP 102/74 (BP Location: Right Arm, Patient Position: Sitting, Cuff Size: Normal)   Pulse 68   Temp (!) 97.5 F (36.4 C) (Oral)   Ht 5\' 4"  (1.626 m) Comment: no shoes  Wt 143 lb 8 oz (65.1 kg)   SpO2 93%   BMI 24.63 kg/m   Body mass index is 24.63 kg/m.  Advanced Directives 07/14/2017 10/29/2016 10/29/2016 06/22/2016 10/11/2015 08/16/2015  Does Patient Have a Medical Advance Directive? No No No No No No  Would patient like information on creating a medical advance directive? No - Patient declined No - Patient declined - - - Yes - Spiritual care consult ordered    Tobacco Social History   Tobacco Use  Smoking Status Former Smoker  . Packs/day: 1.00  . Years: 40.00  . Pack years: 40.00  . Last attempt to quit: 06/01/2001  . Years since quitting: 16.1  Smokeless Tobacco Never Used     Counseling given: No   Clinical Intake:  Pre-visit preparation completed: Yes  Pain : No/denies pain Pain Score: 0-No pain     Nutritional Status: BMI of 19-24  Normal Nutritional Risks: None Diabetes: No  How often do you need to have someone help you when you read instructions, pamphlets, or other written materials from your doctor or pharmacy?: 1 - Never What is the last grade level you completed in school?: 12th grade  Interpreter Needed?: No  Comments: pt lives with significant other Information entered by :: LPinson, LPN  Past Medical History:  Diagnosis Date  . Acute cholecystitis   . Cholecystitis with cholelithiasis 10/11/2015  . COPD (chronic obstructive pulmonary disease) (HCC)   . Ex-smoker    quit 2000s  . Pneumonia 11/26/2015   Past Surgical History:  Procedure Laterality Date  . ABDOMINAL HYSTERECTOMY  1978   part of an ovary remained  . APPENDECTOMY  1978  . CATARACT EXTRACTION, BILATERAL  2013  . CHOLECYSTECTOMY N/A 10/11/2015   Procedure: LAPAROSCOPIC CHOLECYSTECTOMY;  Surgeon: Leafy Roiego F Pabon, MD;  Location: [facility] ORS;  Service: General;  Laterality: N/A;  . COLONOSCOPY  07/2016   HP, ext hem, diverticulosis, rpt 5 yrs (Outlaw)  . TONSILLECTOMY     Family History  Problem Relation Age of Onset  . Heart attack Father [id number]  . Diabetes Father   . Diabetes Mother   . Stroke Neg Hx   . Cancer Neg Hx    Social History   Socioeconomic History  . Marital status: Widowed    Spouse name: None  . Number of children: None  . Years of education: None  . Highest education level: None  Social Needs  . Financial resource strain: None  . Food insecurity - worry: None  . Food insecurity - inability: None  . Transportation needs - medical: None  . Transportation needs - non-medical: None  Occupational History  . None  Tobacco Use  . Smoking status: Former Smoker    Packs/day: 1.00    Years: 40.00    Pack years: 40.00    Last attempt to quit: 06/01/2001    Years since quitting: 16.1  . Smokeless tobacco: Never Used  Substance and Sexual Activity  . Alcohol use: No  . Drug use: No  .  Sexual activity: None  Other Topics Concern  . None  Social History Narrative   Originally from [place]   Widower - husband passed away 2010/10/13   Lives alone, 1 chihuahua   GF of Stacey Durham   Occ: retired, worked at [employer]: enjoys going to [employer]: good water, fruits/vegetables daily    Outpatient Encounter Medications as of 07/14/2017  Medication Sig  . albuterol (PROVENTIL) (2.5 MG/3ML) 0.083% nebulizer solution Take 3 mLs (2.5 mg total) by nebulization every 4 (four) hours as needed. Reported on 10/11/2015  . Biotin 16109 MCG TABS Take 1 tablet by mouth.  . calcium-vitamin D (OSCAL WITH D) 500-200 MG-UNIT tablet Take 1 tablet by mouth.  . montelukast (SINGULAIR) 10 MG tablet Take  1 tablet (10 mg total) by mouth daily.  . Multiple Vitamins-Minerals (MULTIVITAMIN ADULT PO) Take 1 tablet by mouth.  Marland Kitchen PROAIR HFA 108 (90 Base) MCG/ACT inhaler Inhale 2 puffs into the lungs every 4 (four) hours as needed.  . Probiotic Product (PROBIOTIC-10 PO) Take 1 tablet by mouth.  . Respiratory Therapy Supplies (FLUTTER) DEVI 1 each by Does not apply route once.  . vitamin B-12 (CYANOCOBALAMIN) 1000 MCG tablet Take 1,000 mcg by mouth daily.  . [DISCONTINUED] doxycycline (VIBRA-TABS) 100 MG tablet Take 1 tablet (100 mg total) by mouth 2 (two) times daily.  . [DISCONTINUED] predniSONE (DELTASONE) 20 MG tablet Take two tablets daily for 3 days followed by one tablet daily for 4 days   No facility-administered encounter medications on file as of 07/14/2017.     Activities of Daily Living In your present state of health, do you have any difficulty performing the following activities: 07/14/2017 10/29/2016  Hearing? N N  Vision? N N  Difficulty concentrating or making decisions? N N  Walking or climbing stairs? N N  Dressing or bathing? N N  Doing errands, shopping? N N  Preparing Food and eating ? N -  Using the Toilet? N -  In the past six months, have you accidently leaked urine? N -  Do you have problems with loss of bowel control? N -  Managing your Medications? N -  Managing your Finances? N -  Housekeeping or managing your Housekeeping? N -  Some recent data might be hidden    Patient Care Team: Eustaquio Boyden, MD as PCP - General (Family Medicine) Willis Modena, MD as Consulting Physician (Gastroenterology) Carrie Mew, OD as Consulting Physician (Optometry)    Assessment:   This is a routine wellness examination for Stacey Durham.  Exercise Activities and Dietary recommendations Current Exercise Habits: The patient does not participate in regular exercise at present, Exercise limited by: None identified  Goals    . Increase physical activity     Starting 07/14/2017, I  will continue to exercise for 45 minutes 3 days per week.        Fall Risk Fall Risk  07/14/2017 06/22/2016  Falls in the past year? No No   Depression Screen PHQ 2/9 Scores 07/14/2017 06/22/2016  PHQ - 2 Score 0 0  PHQ- 9 Score 0 -     Cognitive Function MMSE - Mini Mental State Exam 07/14/2017 06/22/2016  Orientation to time 5 5  Orientation to Place 5 5  Registration 3 3  Attention/ Calculation 0 0  Recall 3 3  Language- name 2 objects 0 0  Language- repeat 1 1  Language- follow 3 step command 3 3  Language- read & follow direction  0 0  Write a sentence 0 0  Copy design 0 0  Total score 20 20        Immunization History  Administered Date(s) Administered  . Influenza Split 01/30/2015  . Influenza,inj,Quad PF,6+ Mos 01/31/2016, 06/24/2017  . Influenza-Unspecified 06/05/2015  . Pneumococcal Conjugate-13 06/05/2015  . Pneumococcal Polysaccharide-23 03/28/2012  . Tdap 06/23/2012    Screening Tests Health Maintenance  Topic Date Due  . MAMMOGRAM  07/15/2018  . COLONOSCOPY  08/12/2021  . DTaP/Tdap/Td (2 - Td) 06/23/2022  . TETANUS/TDAP  06/23/2022  . INFLUENZA VACCINE  Completed  . DEXA SCAN  Completed  . Hepatitis C Screening  Completed  . PNA vac Low Risk Adult  Completed       Plan:     I have personally reviewed, addressed, and noted the following in the patient's chart:  A. Medical and social history B. Use of alcohol, tobacco or illicit drugs  C. Current medications and supplements D. Functional ability and status E.  Nutritional status F.  Physical activity G. Advance directives H. List of other physicians I.  Hospitalizations, surgeries, and ER visits in previous 12 months J.  Vitals K. Screenings to include hearing, vision, cognitive, depression L. Referrals and appointments - none  In addition, I have reviewed and discussed with patient certain preventive protocols, quality metrics, and best practice recommendations. A written personalized  care plan for preventive services as well as general preventive health recommendations were provided to patient.  See attached scanned questionnaire for additional information.   Signed,   Randa Evens, MHA, BS, LPN Health Coach

## 2017-07-15 ENCOUNTER — Ambulatory Visit
Admission: RE | Admit: 2017-07-15 | Discharge: 2017-07-15 | Disposition: A | Discharge status: Home / Self Care | Payer: Medicare HMO | Source: Ambulatory Visit | Attending: Family Medicine | Admitting: Family Medicine

## 2017-07-15 DIAGNOSIS — Z1231 Encounter for screening mammogram for malignant neoplasm of breast: Secondary | ICD-10-CM | POA: Diagnosis not present

## 2017-07-15 DIAGNOSIS — Z139 Encounter for screening, unspecified: Secondary | ICD-10-CM

## 2017-07-15 LAB — HM MAMMOGRAPHY

## 2017-07-20 ENCOUNTER — Encounter: Payer: Self-pay | Admitting: Family Medicine

## 2017-07-21 ENCOUNTER — Ambulatory Visit (INDEPENDENT_AMBULATORY_CARE_PROVIDER_SITE_OTHER): Payer: Medicare HMO | Admitting: Family Medicine

## 2017-07-21 ENCOUNTER — Encounter: Payer: Self-pay | Admitting: Family Medicine

## 2017-07-21 VITALS — BP 120/70 | HR 76 | Temp 97.9°F | Ht 64.0 in | Wt 147.5 lb

## 2017-07-21 DIAGNOSIS — R7303 Prediabetes: Secondary | ICD-10-CM | POA: Diagnosis not present

## 2017-07-21 DIAGNOSIS — J449 Chronic obstructive pulmonary disease, unspecified: Secondary | ICD-10-CM

## 2017-07-21 DIAGNOSIS — Z7189 Other specified counseling: Secondary | ICD-10-CM

## 2017-07-21 DIAGNOSIS — I700 Atherosclerosis of aorta: Secondary | ICD-10-CM | POA: Diagnosis not present

## 2017-07-21 DIAGNOSIS — E2839 Other primary ovarian failure: Secondary | ICD-10-CM

## 2017-07-21 DIAGNOSIS — E038 Other specified hypothyroidism: Secondary | ICD-10-CM

## 2017-07-21 DIAGNOSIS — Z87891 Personal history of nicotine dependence: Secondary | ICD-10-CM

## 2017-07-21 DIAGNOSIS — Z0000 Encounter for general adult medical examination without abnormal findings: Secondary | ICD-10-CM | POA: Diagnosis not present

## 2017-07-21 DIAGNOSIS — E785 Hyperlipidemia, unspecified: Secondary | ICD-10-CM | POA: Diagnosis not present

## 2017-07-21 DIAGNOSIS — E039 Hypothyroidism, unspecified: Secondary | ICD-10-CM

## 2017-07-21 NOTE — Assessment & Plan Note (Signed)
Chronic off medication. No longer on fish oil. Reviewed healthy diet choices to maintain lipid control. The 10-year ASCVD risk score Denman George(Goff DC Montez HagemanJr., et al., 2013) is: 10.6%   Values used to calculate the score:     Age: [age] years     Sex: Female     Is Non-Hispanic African American: No     Diabetic: No     Tobacco smoker: No     Systolic Blood Pressure: 120 mmHg     Is BP treated: No     HDL Cholesterol: 56 mg/dL     Total Cholesterol: 216 mg/dL

## 2017-07-21 NOTE — Assessment & Plan Note (Addendum)
Preventative protocols reviewed and updated unless pt declined. Discussed healthy diet and lifestyle.  Pt states she had DEXA >5 yrs ago - I don't see records of this in system. She does have risk factors for osteoporosis - will update baseline DEXA.

## 2017-07-21 NOTE — Progress Notes (Signed)
BP 120/70 (BP Location: Left Arm, Patient Position: Sitting, Cuff Size: Normal)   Pulse 76   Temp 97.9 F (36.6 C) (Oral)   Ht 5\' 4"  (1.626 m)   Wt 147 lb 8 oz (66.9 kg)   SpO2 95%   BMI 25.32 kg/m    CC: CPE Subjective:    Patient ID: Stacey Durham, female    DOB: 1945/10/28, 72 y.o.   MRN: [id number]  HPI: Stacey Durham is a 72 y.o. female presenting on 07/21/2017 for Annual Exam (Pt 2.)   Saw Virl Axe last week for medicare wellness visit. Note reviewed. Failed hearing screen. Pt denies trouble.  COPD exac 06/2017 treated with prednisone/doxy. Sees pulm regularly, last saw Dr Belia Heman 03/2017. Takes singulair regularly, albuterol PRN.   Preventative: Colonoscopy Oct 10, 2012 with 5 polyps (Outlaw) - to call and f/u with [facility] (outlaw)  COLONOSCOPY 07/2016 HP, ext hem, diverticulosis, rpt 5 yrs (Outlaw) Lung cancer screening - recent chest CT 12/2016 stable. Completed screening.  Breast cancer screening - mammo WNL 07/2017 Well woman exam - s/p hysterectomy 1978, part of an ovary remains.  DEXA scan - normal per patient but unsure date, doesn't know results.  Flu shot - yearly Tdap 10-10-12 Pneumovax 03/2012, prevnar 06/2015 shingrix - discussed Advanced directive discussion - packet provided earlier this week. She thinks she has had this completed by attorney - will check at home. HCPOA - likely [name].  Seat belt use discussed Sunscreen use discussed. No changing moles on skin. Ex smoker quit 10-10-01 Alcohol - none  Originally [place] Widower - husband passed away 10-11-2010 Lives alone, 1 chihuahua GF of Page Spiro Occ: retired, worked at [employer]: enjoys going to [employer]: good water, fruits/vegetables daily  Relevant past medical, surgical, family and social history reviewed and updated as indicated. Interim medical history since our last visit reviewed. Allergies and medications reviewed and updated. Outpatient Medications Prior to Visit  Medication  Sig Dispense Refill  . albuterol (PROVENTIL) (2.5 MG/3ML) 0.083% nebulizer solution Take 3 mLs (2.5 mg total) by nebulization every 4 (four) hours as needed. Reported on 10/11/2015 125 mL 2  . Biotin 40981 MCG TABS Take 1 tablet by mouth.    . calcium-vitamin D (OSCAL WITH D) 500-200 MG-UNIT tablet Take 1 tablet by mouth.    . Dextromethorphan-Guaifenesin (MUCINEX DM PO) Take by mouth daily.    . montelukast (SINGULAIR) 10 MG tablet Take 1 tablet (10 mg total) by mouth daily. 90 tablet 3  . Multiple Vitamins-Minerals (MULTIVITAMIN ADULT PO) Take 1 tablet by mouth.    Marland Kitchen PROAIR HFA 108 (90 Base) MCG/ACT inhaler Inhale 2 puffs into the lungs every 4 (four) hours as needed. 1 Inhaler 2  . Probiotic Product (PROBIOTIC-10 PO) Take 1 tablet by mouth.    . Respiratory Therapy Supplies (FLUTTER) DEVI 1 each by Does not apply route once. 1 each 0  . vitamin B-12 (CYANOCOBALAMIN) 1000 MCG tablet Take 1,000 mcg by mouth daily.     No facility-administered medications prior to visit.      Per HPI unless specifically indicated in ROS section below Review of Systems  Constitutional: Negative for activity change, appetite change, chills, fatigue, fever and unexpected weight change.  HENT: Negative for hearing loss.   Eyes: Negative for visual disturbance.  Respiratory: Positive for cough, shortness of breath and wheezing. Negative for chest tightness.   Cardiovascular: Negative for chest pain, palpitations and leg swelling.  Gastrointestinal: Positive for diarrhea (chronic). Negative for  abdominal distention, abdominal pain, blood in stool, constipation, nausea and vomiting.  Genitourinary: Negative for difficulty urinating and hematuria.  Musculoskeletal: Negative for arthralgias, myalgias and neck pain.  Skin: Negative for rash.  Neurological: Negative for dizziness, seizures, syncope and headaches.  Hematological: Negative for adenopathy. Does not bruise/bleed easily.  Psychiatric/Behavioral: Negative  for dysphoric mood. The patient is not nervous/anxious.        Objective:    BP 120/70 (BP Location: Left Arm, Patient Position: Sitting, Cuff Size: Normal)   Pulse 76   Temp 97.9 F (36.6 C) (Oral)   Ht 5\' 4"  (1.626 m)   Wt 147 lb 8 oz (66.9 kg)   SpO2 95%   BMI 25.32 kg/m   Wt Readings from Last 3 Encounters:  07/21/17 147 lb 8 oz (66.9 kg)  07/14/17 143 lb 8 oz (65.1 kg)  06/03/17 143 lb (64.9 kg)    Physical Exam  Constitutional: She is oriented to person, place, and time. She appears well-developed and well-nourished. No distress.  HENT:  Head: Normocephalic and atraumatic.  Right Ear: Hearing, tympanic membrane, external ear and ear canal normal.  Left Ear: Hearing, tympanic membrane, external ear and ear canal normal.  Nose: Nose normal.  Mouth/Throat: Uvula is midline, oropharynx is clear and moist and mucous membranes are normal. No oropharyngeal exudate, posterior oropharyngeal edema or posterior oropharyngeal erythema.  Eyes: Conjunctivae and EOM are normal. Pupils are equal, round, and reactive to light. No scleral icterus.  Neck: Normal range of motion. Neck supple. Carotid bruit is not present. No thyromegaly present.  Cardiovascular: Normal rate, regular rhythm, normal heart sounds and intact distal pulses.  No murmur heard. Pulses:      Radial pulses are 2+ on the right side, and 2+ on the left side.  Pulmonary/Chest: Effort normal and breath sounds normal. No respiratory distress. She has no wheezes. She has no rales.  Abdominal: Soft. Bowel sounds are normal. She exhibits no distension and no mass. There is no tenderness. There is no rebound and no guarding.  Musculoskeletal: Normal range of motion. She exhibits no edema.  Lymphadenopathy:    She has no cervical adenopathy.  Neurological: She is alert and oriented to person, place, and time.  CN grossly intact, station and gait intact  Skin: Skin is warm and dry. No rash noted.  Psychiatric: She has a  normal mood and affect. Her behavior is normal. Judgment and thought content normal.  Nursing note and vitals reviewed.  Results for orders placed or performed in visit on 07/20/17  HM MAMMOGRAPHY  Result Value Ref Range   HM Mammogram 0-4 Bi-Rad 0-4 Bi-Rad, Self Reported Normal      Assessment & Plan:   Problem List Items Addressed This Visit    Advanced care planning/counseling discussion    Advanced directive discussion - packet provided earlier this week. She thinks she has had this completed by attorney - will check at home. HCPOA - likely Rocky LinkKen.       Aortic atherosclerosis (HCC)    Off aspirin, statin. continue to monitor.      Chronic obstructive pulmonary disease (HCC)    Appreciate pulm care of patient. Continue singulair and albuterol PRN.      Relevant Medications   Dextromethorphan-Guaifenesin (MUCINEX DM PO)   Ex-smoker    Remains abstinent. Has completed lung cancer screening (>15 yrs since quit smoking)      Health maintenance examination - Primary    Preventative protocols reviewed and updated unless pt  declined. Discussed healthy diet and lifestyle.  Pt states she had DEXA >5 yrs ago - I don't see records of this in system. She does have risk factors for osteoporosis - will update baseline DEXA.       HLD (hyperlipidemia)    Chronic off medication. No longer on fish oil. Reviewed healthy diet choices to maintain lipid control. The 10-year ASCVD risk score Denman George DC Montez Hageman., et al., 2013) is: 10.6%   Values used to calculate the score:     Age: 57 years     Sex: Female     Is Non-Hispanic African American: No     Diabetic: No     Tobacco smoker: No     Systolic Blood Pressure: 120 mmHg     Is BP treated: No     HDL Cholesterol: 56 mg/dL     Total Cholesterol: 216 mg/dL       Prediabetes    Reviewed avoiding added sugars and carbs.       Subclinical hypothyroidism    Reviewed with patient- denies significant hypothyroid symptoms. Will continue to  monitor.        Other Visit Diagnoses    Estrogen deficiency       Relevant Orders   DG Bone Density       No orders of the defined types were placed in this encounter.  Orders Placed This Encounter  Procedures  . DG Bone Density    Standing Status:   Future    Standing Expiration Date:   09/19/2018    Order Specific Question:   Reason for Exam (SYMPTOM  OR DIAGNOSIS REQUIRED)    Answer:   osteoporosis screening    Order Specific Question:   Preferred imaging location?    Answer:   [facility]    Follow up plan: Return in about 1 year (around 07/21/2018) for annual exam, prior fasting for blood work, medicare wellness visit.  Eustaquio Boyden, MD

## 2017-07-21 NOTE — Assessment & Plan Note (Signed)
Reviewed avoiding added sugars and carbs.

## 2017-07-21 NOTE — Patient Instructions (Addendum)
I may refer you to update bone density scan. If interested, check with pharmacy about new 2 shot shingles series (shingrix).  Bring us copy of your advanced directive.  Thyroid was a bit underactive - watch for low thyroid symptoms (constipation, weight gain, fatigue, and cold intolerance. Return as needed or in 1 year for next medicare wellness visit with Lesia and physical with me.   Health Maintenance, Female Adopting a healthy lifestyle and getting preventive care can go a long way to promote health and wellness. Talk with your health care provider about what schedule of regular examinations is right for you. This is a good chance for you to check in with your provider about disease prevention and staying healthy. In between checkups, there are plenty of things you can do on your own. Experts have done a lot of research about which lifestyle changes and preventive measures are most likely to keep you healthy. Ask your health care provider for more information. Weight and diet Eat a healthy diet  Be sure to include plenty of vegetables, fruits, low-fat dairy products, and lean protein.  Do not eat a lot of foods high in solid fats, added sugars, or salt.  Get regular exercise. This is one of the most important things you can do for your health. ? Most adults should exercise for at least 150 minutes each week. The exercise should increase your heart rate and make you sweat (moderate-intensity exercise). ? Most adults should also do strengthening exercises at least twice a week. This is in addition to the moderate-intensity exercise.  Maintain a healthy weight  Body mass index (BMI) is a measurement that can be used to identify possible weight problems. It estimates body fat based on height and weight. Your health care provider can help determine your BMI and help you achieve or maintain a healthy weight.  For females 20 years of age and older: ? A BMI below 18.5 is considered  underweight. ? A BMI of 18.5 to 24.9 is normal. ? A BMI of 25 to 29.9 is considered overweight. ? A BMI of 30 and above is considered obese.  Watch levels of cholesterol and blood lipids  You should start having your blood tested for lipids and cholesterol at 73 years of age, then have this test every 5 years.  You may need to have your cholesterol levels checked more often if: ? Your lipid or cholesterol levels are high. ? You are older than 73 years of age. ? You are at high risk for heart disease.  Cancer screening Lung Cancer  Lung cancer screening is recommended for adults 55-80 years old who are at high risk for lung cancer because of a history of smoking.  A yearly low-dose CT scan of the lungs is recommended for people who: ? Currently smoke. ? Have quit within the past 15 years. ? Have at least a 30-pack-year history of smoking. A pack year is smoking an average of one pack of cigarettes a day for 1 year.  Yearly screening should continue until it has been 15 years since you quit.  Yearly screening should stop if you develop a health problem that would prevent you from having lung cancer treatment.  Breast Cancer  Practice breast self-awareness. This means understanding how your breasts normally appear and feel.  It also means doing regular breast self-exams. Let your health care provider know about any changes, no matter how small.  If you are in your 20s or 30s, you should   have a clinical breast exam (CBE) by a health care provider every 1-3 years as part of a regular health exam.  If you are 72 or older, have a CBE every year. Also consider having a breast X-ray (mammogram) every year.  If you have a family history of breast cancer, talk to your health care provider about genetic screening.  If you are at high risk for breast cancer, talk to your health care provider about having an MRI and a mammogram every year.  Breast cancer gene (BRCA) assessment is  recommended for women who have family members with BRCA-related cancers. BRCA-related cancers include: ? Breast. ? Ovarian. ? Tubal. ? Peritoneal cancers.  Results of the assessment will determine the need for genetic counseling and BRCA1 and BRCA2 testing.  Cervical Cancer Your health care provider may recommend that you be screened regularly for cancer of the pelvic organs (ovaries, uterus, and vagina). This screening involves a pelvic examination, including checking for microscopic changes to the surface of your cervix (Pap test). You may be encouraged to have this screening done every 3 years, beginning at age 24.  For women ages 3-65, health care providers may recommend pelvic exams and Pap testing every 3 years, or they may recommend the Pap and pelvic exam, combined with testing for human papilloma virus (HPV), every 5 years. Some types of HPV increase your risk of cervical cancer. Testing for HPV may also be done on women of any age with unclear Pap test results.  Other health care providers may not recommend any screening for nonpregnant women who are considered low risk for pelvic cancer and who do not have symptoms. Ask your health care provider if a screening pelvic exam is right for you.  If you have had past treatment for cervical cancer or a condition that could lead to cancer, you need Pap tests and screening for cancer for at least 20 years after your treatment. If Pap tests have been discontinued, your risk factors (such as having a new sexual partner) need to be reassessed to determine if screening should resume. Some women have medical problems that increase the chance of getting cervical cancer. In these cases, your health care provider may recommend more frequent screening and Pap tests.  Colorectal Cancer  This type of cancer can be detected and often prevented.  Routine colorectal cancer screening usually begins at 73 years of age and continues through 73 years of  age.  Your health care provider may recommend screening at an earlier age if you have risk factors for colon cancer.  Your health care provider may also recommend using home test kits to check for hidden blood in the stool.  A small camera at the end of a tube can be used to examine your colon directly (sigmoidoscopy or colonoscopy). This is done to check for the earliest forms of colorectal cancer.  Routine screening usually begins at age 21.  Direct examination of the colon should be repeated every 5-10 years through 73 years of age. However, you may need to be screened more often if early forms of precancerous polyps or small growths are found.  Skin Cancer  Check your skin from head to toe regularly.  Tell your health care provider about any new moles or changes in moles, especially if there is a change in a mole's shape or color.  Also tell your health care provider if you have a mole that is larger than the size of a pencil eraser.  Always  use sunscreen. Apply sunscreen liberally and repeatedly throughout the day.  Protect yourself by wearing long sleeves, pants, a wide-brimmed hat, and sunglasses whenever you are outside.  Heart disease, diabetes, and high blood pressure  High blood pressure causes heart disease and increases the risk of stroke. High blood pressure is more likely to develop in: ? People who have blood pressure in the high end of the normal range (130-139/85-89 mm Hg). ? People who are overweight or obese. ? People who are African American.  If you are 18-39 years of age, have your blood pressure checked every 3-5 years. If you are 40 years of age or older, have your blood pressure checked every year. You should have your blood pressure measured twice-once when you are at a hospital or clinic, and once when you are not at a hospital or clinic. Record the average of the two measurements. To check your blood pressure when you are not at a hospital or clinic, you  can use: ? An automated blood pressure machine at a pharmacy. ? A home blood pressure monitor.  If you are between 55 years and 79 years old, ask your health care provider if you should take aspirin to prevent strokes.  Have regular diabetes screenings. This involves taking a blood sample to check your fasting blood sugar level. ? If you are at a normal weight and have a low risk for diabetes, have this test once every three years after 73 years of age. ? If you are overweight and have a high risk for diabetes, consider being tested at a younger age or more often. Preventing infection Hepatitis B  If you have a higher risk for hepatitis B, you should be screened for this virus. You are considered at high risk for hepatitis B if: ? You were born in a country where hepatitis B is common. Ask your health care provider which countries are considered high risk. ? Your parents were born in a high-risk country, and you have not been immunized against hepatitis B (hepatitis B vaccine). ? You have HIV or AIDS. ? You use needles to inject street drugs. ? You live with someone who has hepatitis B. ? You have had sex with someone who has hepatitis B. ? You get hemodialysis treatment. ? You take certain medicines for conditions, including cancer, organ transplantation, and autoimmune conditions.  Hepatitis C  Blood testing is recommended for: ? Everyone born from 1945 through 1965. ? Anyone with known risk factors for hepatitis C.  Sexually transmitted infections (STIs)  You should be screened for sexually transmitted infections (STIs) including gonorrhea and chlamydia if: ? You are sexually active and are younger than 73 years of age. ? You are older than 73 years of age and your health care provider tells you that you are at risk for this type of infection. ? Your sexual activity has changed since you were last screened and you are at an increased risk for chlamydia or gonorrhea. Ask your  health care provider if you are at risk.  If you do not have HIV, but are at risk, it may be recommended that you take a prescription medicine daily to prevent HIV infection. This is called pre-exposure prophylaxis (PrEP). You are considered at risk if: ? You are sexually active and do not regularly use condoms or know the HIV status of your partner(s). ? You take drugs by injection. ? You are sexually active with a partner who has HIV.  Talk with your   health care provider about whether you are at high risk of being infected with HIV. If you choose to begin PrEP, you should first be tested for HIV. You should then be tested every 3 months for as long as you are taking PrEP. Pregnancy  If you are premenopausal and you may become pregnant, ask your health care provider about preconception counseling.  If you may become pregnant, take 400 to 800 micrograms (mcg) of folic acid every day.  If you want to prevent pregnancy, talk to your health care provider about birth control (contraception). Osteoporosis and menopause  Osteoporosis is a disease in which the bones lose minerals and strength with aging. This can result in serious bone fractures. Your risk for osteoporosis can be identified using a bone density scan.  If you are 65 years of age or older, or if you are at risk for osteoporosis and fractures, ask your health care provider if you should be screened.  Ask your health care provider whether you should take a calcium or vitamin D supplement to lower your risk for osteoporosis.  Menopause may have certain physical symptoms and risks.  Hormone replacement therapy may reduce some of these symptoms and risks. Talk to your health care provider about whether hormone replacement therapy is right for you. Follow these instructions at home:  Schedule regular health, dental, and eye exams.  Stay current with your immunizations.  Do not use any tobacco products including cigarettes, chewing  tobacco, or electronic cigarettes.  If you are pregnant, do not drink alcohol.  If you are breastfeeding, limit how much and how often you drink alcohol.  Limit alcohol intake to no more than 1 drink per day for nonpregnant women. One drink equals 12 ounces of beer, 5 ounces of wine, or 1 ounces of hard liquor.  Do not use street drugs.  Do not share needles.  Ask your health care provider for help if you need support or information about quitting drugs.  Tell your health care provider if you often feel depressed.  Tell your health care provider if you have ever been abused or do not feel safe at home. This information is not intended to replace advice given to you by your health care provider. Make sure you discuss any questions you have with your health care provider. Document Released: 12/01/2010 Document Revised: 10/24/2015 Document Reviewed: 02/19/2015 Elsevier Interactive Patient Education  2018 [employer]  

## 2017-07-21 NOTE — Assessment & Plan Note (Addendum)
Advanced directive discussion - packet provided earlier this week. She thinks she has had this completed by attorney - will check at home. HCPOA - likely [place].

## 2017-07-21 NOTE — Assessment & Plan Note (Signed)
Appreciate pulm care of patient. Continue singulair and albuterol PRN.

## 2017-07-21 NOTE — Assessment & Plan Note (Signed)
Off aspirin, statin. continue to monitor.

## 2017-07-21 NOTE — Assessment & Plan Note (Signed)
Reviewed with patient- denies significant hypothyroid symptoms. Will continue to monitor.

## 2017-07-21 NOTE — Assessment & Plan Note (Addendum)
Remains abstinent. Has completed lung cancer screening (>15 yrs since quit smoking)

## 2017-08-05 ENCOUNTER — Encounter: Payer: Self-pay | Admitting: Internal Medicine

## 2017-08-05 NOTE — Telephone Encounter (Signed)
This encounter was created in error - please disregard.

## 2017-08-06 ENCOUNTER — Ambulatory Visit
Admission: RE | Admit: 2017-08-06 | Discharge: 2017-08-06 | Disposition: A | Discharge status: Home / Self Care | Payer: Medicare HMO | Source: Ambulatory Visit | Attending: Internal Medicine | Admitting: Internal Medicine

## 2017-08-06 ENCOUNTER — Ambulatory Visit: Payer: Medicare HMO | Admitting: Internal Medicine

## 2017-08-06 ENCOUNTER — Other Ambulatory Visit
Admission: RE | Admit: 2017-08-06 | Discharge: 2017-08-06 | Disposition: A | Discharge status: Home / Self Care | Payer: Medicare HMO | Source: Ambulatory Visit | Attending: Internal Medicine | Admitting: Internal Medicine

## 2017-08-06 ENCOUNTER — Encounter: Payer: Self-pay | Admitting: Internal Medicine

## 2017-08-06 VITALS — BP 118/80 | HR 90 | Ht 64.0 in

## 2017-08-06 DIAGNOSIS — J441 Chronic obstructive pulmonary disease with (acute) exacerbation: Secondary | ICD-10-CM

## 2017-08-06 DIAGNOSIS — R0602 Shortness of breath: Secondary | ICD-10-CM | POA: Diagnosis not present

## 2017-08-06 DIAGNOSIS — R69 Illness, unspecified: Secondary | ICD-10-CM | POA: Diagnosis not present

## 2017-08-06 LAB — INFLUENZA PANEL BY PCR (TYPE A & B)
Influenza A By PCR: NEGATIVE
Influenza B By PCR: NEGATIVE

## 2017-08-06 MED ORDER — IPRATROPIUM-ALBUTEROL 0.5-2.5 (3) MG/3ML IN SOLN
3.0000 mL | Freq: Once | RESPIRATORY_TRACT | Status: AC
  Administered 2017-08-06: 3 mL via RESPIRATORY_TRACT

## 2017-08-06 MED ORDER — PREDNISONE 20 MG PO TABS: 40.0000 mg | ORAL_TABLET | Freq: Every day | ORAL | 0 refills | Status: DC

## 2017-08-06 MED ORDER — PREDNISONE 10 MG PO TABS: 40.0000 mg | ORAL_TABLET | Freq: Every day | ORAL | 0 refills | Status: DC

## 2017-08-06 MED ORDER — AMOXICILLIN-POT CLAVULANATE 875-125 MG PO TABS: 1.0000 | ORAL_TABLET | Freq: Two times a day (BID) | ORAL | 0 refills | Status: DC

## 2017-08-06 MED ORDER — METHYLPREDNISOLONE ACETATE 80 MG/ML IJ SUSP
120.0000 mg | Freq: Once | INTRAMUSCULAR | Status: AC
  Administered 2017-08-06: 120 mg via INTRAMUSCULAR

## 2017-08-06 NOTE — Progress Notes (Signed)
[facility] [place] Consultation     Date: 08/06/2017,   MRN# [id number] Stacey Durham [birth year]   CHIEF COMPLAINT:  +resp distress   HISTORY OF PRESENT ILLNESS  patient has chronic SOB, chronic DOE Now with resp distress +wheezing +fevers +feeling bad +SOB/DOE on spiriva,  Using albuterol  as needed  She uses Dulera as needed She takes Spiriva daily    +signs of infection at this time  no signs of CHF at this time Patient given douneb in office depomedrol shot given in office 120 mg IM  Current Medication:   Current Outpatient Medications:  .  albuterol (PROVENTIL) (2.5 MG/3ML) 0.083% nebulizer solution, Take 3 mLs (2.5 mg total) by nebulization every 4 (four) hours as needed. Reported on 10/11/2015, Disp: 125 mL, Rfl: 2 .  Biotin 0865710000 MCG TABS, Take 1 tablet by mouth., Disp: , Rfl:  .  calcium-vitamin D (OSCAL WITH D) 500-200 MG-UNIT tablet, Take 1 tablet by mouth., Disp: , Rfl:  .  Dextromethorphan-Guaifenesin (MUCINEX DM PO), Take by mouth daily., Disp: , Rfl:  .  montelukast (SINGULAIR) 10 MG tablet, Take 1 tablet (10 mg total) by mouth daily., Disp: 90 tablet, Rfl: 3 .  Multiple Vitamins-Minerals (MULTIVITAMIN ADULT PO), Take 1 tablet by mouth., Disp: , Rfl:  .  PROAIR HFA 108 (90 Base) MCG/ACT inhaler, Inhale 2 puffs into the lungs every 4 (four) hours as needed., Disp: 1 Inhaler, Rfl: 2 .  Probiotic Product (PROBIOTIC-10 PO), Take 1 tablet by mouth., Disp: , Rfl:  .  Respiratory Therapy Supplies (FLUTTER) DEVI, 1 each by Does not apply route once., Disp: 1 each, Rfl: 0 .  vitamin B-12 (CYANOCOBALAMIN) 1000 MCG tablet, Take 1,000 mcg by mouth daily., Disp: , Rfl:      ALLERGIES   Patient has no known allergies.     REVIEW OF SYSTEMS   Review of Systems  Constitutional: Negative for chills, fever, malaise/fatigue and weight loss.  HENT: Negative for congestion.   Respiratory: Positive for shortness of breath. Negative for cough,  hemoptysis, sputum production and wheezing.        Chronic SOB/DOE at baseline  Cardiovascular: Negative for chest pain, palpitations, orthopnea and leg swelling.  All other systems reviewed and are negative.    VS: BP 118/80 (BP Location: Left Arm, Cuff Size: Normal)   Pulse 90   Ht 5\' 4"  (1.626 m)   SpO2 93%   BMI 25.32 kg/m      PHYSICAL EXAM   Physical Exam  Constitutional: She is oriented to person, place, and time. She appears well-developed and well-nourished.  HENT:  Mouth/Throat: No oropharyngeal exudate.  Eyes: EOM are normal. No scleral icterus.  Cardiovascular: Normal rate, regular rhythm and normal heart sounds.  No murmur heard. Pulmonary/Chest: No stridor. She is in respiratory distress. She has wheezes. She has rales.  Musculoskeletal: Normal range of motion. She exhibits no edema.  Neurological: She is alert and oriented to person, place, and time.  Skin: Skin is warm.  Psychiatric: She has a normal mood and affect.   CT chest Lung cancer Screening: 08/2015 No nodules/massess PFT 08/2015 Ratio 52% fev1 1.1 46%  fvc 2.1 67%  6MWT WNL ONO WNL     ASSESSMENT/PLAN   72 yo white female with Severe COPD based on Pulmonary function with Gold Stage A with h/o extensive smoking history    +COPD exacerbation Check for FLU Check CXR Prednisone 40 mg daily for 10 day   Chronic obstructive pulmonary disease (  HCC) - severe Gold COPD A -restart inhalers Spiriva hold Dulera -albuterol as needed and prior to exercise -recommend avoid smoke exposure   Deconditioned state  recommend exercise as tolerated Continue exercises tolerated  Follow up in 2 weeks  I Have advised patient to go to ER if symptoms worsen.  Patient  satisfied with Plan of action and management. All questions answered  Lucie Leather, M.D.  [name]  Medical Director [facility] Medical Director [facility] Cardio-Pulmonary Department

## 2017-08-06 NOTE — Addendum Note (Signed)
Addended by: Janean SarkSNIPES, Chayse Gracey K on: 08/06/2017 03:00 PM   Modules accepted: Orders

## 2017-08-06 NOTE — Addendum Note (Signed)
Addended by: Deloria LairAYLOR, Lutricia Widjaja on: 08/06/2017 11:08 AM   Modules accepted: Orders

## 2017-08-06 NOTE — Patient Instructions (Signed)
Prednisone 40 mg daily for 10 days augmentin for 14 days Check for FLU Check CXR

## 2017-08-18 ENCOUNTER — Other Ambulatory Visit: Payer: Self-pay | Admitting: Internal Medicine

## 2017-08-18 DIAGNOSIS — R69 Illness, unspecified: Secondary | ICD-10-CM | POA: Diagnosis not present

## 2017-08-20 ENCOUNTER — Ambulatory Visit: Payer: Medicare HMO | Admitting: Internal Medicine

## 2017-08-20 ENCOUNTER — Encounter: Payer: Self-pay | Admitting: Internal Medicine

## 2017-08-20 VITALS — BP 122/74 | HR 75 | Ht 64.0 in | Wt 144.0 lb

## 2017-08-20 DIAGNOSIS — J449 Chronic obstructive pulmonary disease, unspecified: Secondary | ICD-10-CM | POA: Diagnosis not present

## 2017-08-20 MED ORDER — ALBUTEROL SULFATE HFA 108 (90 BASE) MCG/ACT IN AERS: 2.0000 | INHALATION_SPRAY | RESPIRATORY_TRACT | 12 refills | Status: DC | PRN

## 2017-08-20 MED ORDER — UMECLIDINIUM BROMIDE 62.5 MCG/INH IN AEPB: 1.0000 | INHALATION_SPRAY | Freq: Every day | RESPIRATORY_TRACT | 12 refills | Status: DC

## 2017-08-20 MED ORDER — FLUCONAZOLE 200 MG PO TABS: 200.0000 mg | ORAL_TABLET | Freq: Once | ORAL | 1 refills | Status: AC

## 2017-08-20 NOTE — Progress Notes (Signed)
[facility] Pulmonary Medicine Consultation     Date: 08/20/2017,   MRN# [id number] Stacey Durham 1945-10-27   CHIEF COMPLAINT:  +resp distress   HISTORY OF PRESENT ILLNESS  patient has chronic SOB, chronic DOE Last OV with COPD exacerbation  Feels better Now with yeast infection-vaginal Will need to start INCRUSE still SOB and DOE    no signs of CHF at this time No signs of infection at this time  Current Medication:   Current Outpatient Medications:  .  albuterol (PROVENTIL) (2.5 MG/3ML) 0.083% nebulizer solution, TAKE 3 MLS (2.5 MG TOTAL) BY NEBULIZATION EVERY 4 (FOUR) HOURS AS NEEDED., Disp: 125 mL, Rfl: 2 .  amoxicillin-clavulanate (AUGMENTIN) 875-125 MG tablet, Take 1 tablet by mouth 2 (two) times daily for 14 days., Disp: 28 tablet, Rfl: 0 .  Biotin [id number] MCG TABS, Take 1 tablet by mouth., Disp: , Rfl:  .  calcium-vitamin D (OSCAL WITH D) 500-200 MG-UNIT tablet, Take 1 tablet by mouth., Disp: , Rfl:  .  Dextromethorphan-Guaifenesin (MUCINEX DM PO), Take by mouth daily., Disp: , Rfl:  .  montelukast (SINGULAIR) 10 MG tablet, Take 1 tablet (10 mg total) by mouth daily., Disp: 90 tablet, Rfl: 3 .  Multiple Vitamins-Minerals (MULTIVITAMIN ADULT PO), Take 1 tablet by mouth., Disp: , Rfl:  .  predniSONE (DELTASONE) 10 MG tablet, Take 4 tablets (40 mg total) by mouth daily with breakfast. 10 days, Disp: 40 tablet, Rfl: 0 .  PROAIR HFA 108 (90 Base) MCG/ACT inhaler, Inhale 2 puffs into the lungs every 4 (four) hours as needed., Disp: 1 Inhaler, Rfl: 2 .  Probiotic Product (PROBIOTIC-10 PO), Take 1 tablet by mouth., Disp: , Rfl:  .  Respiratory Therapy Supplies (FLUTTER) DEVI, 1 each by Does not apply route once., Disp: 1 each, Rfl: 0 .  vitamin B-12 (CYANOCOBALAMIN) 1000 MCG tablet, Take 1,000 mcg by mouth daily., Disp: , Rfl:      ALLERGIES   Patient has no known allergies.     REVIEW OF SYSTEMS   Review of Systems  Constitutional: Negative for chills, fever,  malaise/fatigue and weight loss.  HENT: Negative for congestion.   Respiratory: Positive for shortness of breath. Negative for cough, hemoptysis, sputum production and wheezing.        Chronic SOB/DOE at baseline  Cardiovascular: Negative for chest pain, palpitations, orthopnea and leg swelling.  All other systems reviewed and are negative.    VS: Ht 5\' 4"  (1.626 m)   Wt 144 lb (65.3 kg)   BMI 24.72 kg/m      PHYSICAL EXAM   Physical Exam  Constitutional: She is oriented to person, place, and time. She appears well-developed and well-nourished.  HENT:  Mouth/Throat: No oropharyngeal exudate.  Eyes: EOM are normal. No scleral icterus.  Cardiovascular: Normal rate, regular rhythm and normal heart sounds.  No murmur heard. Pulmonary/Chest: No stridor. No respiratory distress. She has no wheezes. She has no rales.  Musculoskeletal: Normal range of motion. She exhibits no edema.  Neurological: She is alert and oriented to person, place, and time.  Skin: Skin is warm.  Psychiatric: She has a normal mood and affect.   CT chest Lung cancer Screening: 08/2015 No nodules/massess PFT 08/2015 Ratio 52% fev1 1.1 46%  fvc 2.1 67%  WNL ONO WNL     ASSESSMENT/PLAN   72 yo white female with Severe COPD based on Pulmonary function with Gold Stage A with h/o extensive smoking history    COPD exacerbation resolved Feels better  after ABx and steroids nad neb therapy    Chronic obstructive pulmonary disease (HCC) - severe Gold COPD A -will need to restart inhalers with INCRUSE -albuterol(ventolin) as needed and prior to exercise -recommend avoid smoke exposure   Deconditioned state  recommend exercise as tolerated Continue exercises tolerated   Patient  satisfied with Plan of action and management. All questions answered  Lucie LeatherKurian David Rose-Marie Hickling, M.D.  [facility]  Medical Director [facility] Medical Director [facility]  Department

## 2017-08-20 NOTE — Patient Instructions (Addendum)
Start INCRUSE Use albuterol(Ventolin) as needed NEBULIZERS as needed

## 2017-08-31 DIAGNOSIS — Z961 Presence of intraocular lens: Secondary | ICD-10-CM | POA: Diagnosis not present

## 2017-08-31 DIAGNOSIS — H524 Presbyopia: Secondary | ICD-10-CM | POA: Diagnosis not present

## 2017-08-31 DIAGNOSIS — H43393 Other vitreous opacities, bilateral: Secondary | ICD-10-CM | POA: Diagnosis not present

## 2017-09-15 ENCOUNTER — Other Ambulatory Visit: Payer: Self-pay | Admitting: Internal Medicine

## 2017-09-15 MED ORDER — ALBUTEROL SULFATE HFA 108 (90 BASE) MCG/ACT IN AERS: 2.0000 | INHALATION_SPRAY | RESPIRATORY_TRACT | 6 refills | Status: DC | PRN

## 2017-09-22 ENCOUNTER — Ambulatory Visit
Admission: RE | Admit: 2017-09-22 | Discharge: 2017-09-22 | Disposition: A | Discharge status: Home / Self Care | Payer: Medicare HMO | Source: Ambulatory Visit | Attending: Family Medicine | Admitting: Family Medicine

## 2017-09-22 DIAGNOSIS — Z780 Asymptomatic menopausal state: Secondary | ICD-10-CM | POA: Diagnosis not present

## 2017-09-22 DIAGNOSIS — E2839 Other primary ovarian failure: Secondary | ICD-10-CM

## 2017-09-22 DIAGNOSIS — M8589 Other specified disorders of bone density and structure, multiple sites: Secondary | ICD-10-CM | POA: Diagnosis not present

## 2017-09-22 LAB — HM DEXA SCAN

## 2017-09-25 ENCOUNTER — Encounter: Payer: Self-pay | Admitting: Family Medicine

## 2017-09-25 DIAGNOSIS — M8580 Other specified disorders of bone density and structure, unspecified site: Secondary | ICD-10-CM | POA: Insufficient documentation

## 2017-09-27 ENCOUNTER — Encounter: Payer: Self-pay | Admitting: Family Medicine

## 2017-10-25 ENCOUNTER — Other Ambulatory Visit: Payer: Self-pay | Admitting: Family Medicine

## 2018-01-18 DIAGNOSIS — R69 Illness, unspecified: Secondary | ICD-10-CM | POA: Diagnosis not present

## 2018-01-29 ENCOUNTER — Other Ambulatory Visit: Payer: Self-pay

## 2018-01-29 ENCOUNTER — Emergency Department: Payer: Medicare HMO

## 2018-01-29 ENCOUNTER — Inpatient Hospital Stay
Admission: EM | Admit: 2018-01-29 | Discharge: 2018-02-02 | DRG: 190 | Disposition: A | Discharge status: Home / Self Care | Payer: Medicare HMO | Attending: Family Medicine | Admitting: Family Medicine

## 2018-01-29 ENCOUNTER — Encounter: Payer: Self-pay | Admitting: Emergency Medicine

## 2018-01-29 DIAGNOSIS — R402363 Coma scale, best motor response, obeys commands, at hospital admission: Secondary | ICD-10-CM | POA: Diagnosis present

## 2018-01-29 DIAGNOSIS — R69 Illness, unspecified: Secondary | ICD-10-CM | POA: Diagnosis not present

## 2018-01-29 DIAGNOSIS — R05 Cough: Secondary | ICD-10-CM | POA: Diagnosis not present

## 2018-01-29 DIAGNOSIS — R402143 Coma scale, eyes open, spontaneous, at hospital admission: Secondary | ICD-10-CM | POA: Diagnosis present

## 2018-01-29 DIAGNOSIS — J441 Chronic obstructive pulmonary disease with (acute) exacerbation: Secondary | ICD-10-CM

## 2018-01-29 DIAGNOSIS — R402253 Coma scale, best verbal response, oriented, at hospital admission: Secondary | ICD-10-CM | POA: Diagnosis present

## 2018-01-29 DIAGNOSIS — J9611 Chronic respiratory failure with hypoxia: Secondary | ICD-10-CM | POA: Diagnosis present

## 2018-01-29 DIAGNOSIS — E860 Dehydration: Secondary | ICD-10-CM | POA: Diagnosis present

## 2018-01-29 DIAGNOSIS — Z87891 Personal history of nicotine dependence: Secondary | ICD-10-CM

## 2018-01-29 DIAGNOSIS — R0602 Shortness of breath: Secondary | ICD-10-CM | POA: Diagnosis not present

## 2018-01-29 DIAGNOSIS — J9601 Acute respiratory failure with hypoxia: Secondary | ICD-10-CM | POA: Diagnosis not present

## 2018-01-29 DIAGNOSIS — J449 Chronic obstructive pulmonary disease, unspecified: Secondary | ICD-10-CM | POA: Diagnosis not present

## 2018-01-29 DIAGNOSIS — J411 Mucopurulent chronic bronchitis: Secondary | ICD-10-CM | POA: Diagnosis not present

## 2018-01-29 DIAGNOSIS — F40240 Claustrophobia: Secondary | ICD-10-CM | POA: Diagnosis present

## 2018-01-29 DIAGNOSIS — R0603 Acute respiratory distress: Secondary | ICD-10-CM

## 2018-01-29 DIAGNOSIS — Z5320 Procedure and treatment not carried out because of patient's decision for unspecified reasons: Secondary | ICD-10-CM | POA: Diagnosis not present

## 2018-01-29 DIAGNOSIS — Z79899 Other long term (current) drug therapy: Secondary | ICD-10-CM

## 2018-01-29 LAB — BASIC METABOLIC PANEL
Anion gap: 9 (ref 5–15)
BUN: 22 mg/dL (ref 8–23)
CALCIUM: 9 mg/dL (ref 8.9–10.3)
CHLORIDE: 105 mmol/L (ref 98–111)
CO2: 27 mmol/L (ref 22–32)
CREATININE: 0.94 mg/dL (ref 0.44–1.00)
GFR calc Af Amer: >60 mL/min (ref >60)
GFR, EST NON AFRICAN AMERICAN: 59 mL/min — AB (ref >60)
Glucose, Bld: 130 mg/dL — ABNORMAL HIGH (ref 70–99)
POTASSIUM: 3.8 mmol/L (ref 3.5–5.1)
Sodium: 141 mmol/L (ref 135–145)

## 2018-01-29 LAB — CBC WITH DIFFERENTIAL/PLATELET
Basophils Absolute: 0.1 10*3/uL (ref 0–0.1)
Basophils Relative: 1 %
Eosinophils Absolute: 0.7 10*3/uL (ref 0–0.7)
Eosinophils Relative: 10 %
HCT: 41.4 % (ref 35.0–47.0)
HEMOGLOBIN: 14.1 g/dL (ref 12.0–16.0)
LYMPHS ABS: 1.6 10*3/uL (ref 1.0–3.6)
LYMPHS PCT: 24 %
MCH: 30.9 pg (ref 26.0–34.0)
MCHC: 34.1 g/dL (ref 32.0–36.0)
MCV: 90.5 fL (ref 80.0–100.0)
Monocytes Absolute: 0.5 10*3/uL (ref 0.2–0.9)
Monocytes Relative: 9 %
NEUTROS PCT: 56 %
Neutro Abs: 3.6 10*3/uL (ref 1.4–6.5)
Platelets: 257 10*3/uL (ref 150–440)
RBC: 4.57 MIL/uL (ref 3.80–5.20)
RDW: 12.9 % (ref 11.5–14.5)
WBC: 6.4 10*3/uL (ref 3.6–11.0)

## 2018-01-29 LAB — MAGNESIUM: Magnesium: 2.2 mg/dL (ref 1.7–2.4)

## 2018-01-29 LAB — PROCALCITONIN: Procalcitonin: <0.1 ng/mL

## 2018-01-29 LAB — GLUCOSE, CAPILLARY: GLUCOSE-CAPILLARY: 163 mg/dL — AB (ref 70–99)

## 2018-01-29 LAB — MRSA PCR SCREENING: MRSA BY PCR: NEGATIVE

## 2018-01-29 LAB — TROPONIN I: Troponin I: <0.03 ng/mL (ref <0.03)

## 2018-01-29 MED ORDER — BISACODYL 5 MG PO TBEC: 5.0000 mg | DELAYED_RELEASE_TABLET | Freq: Every day | ORAL | Status: DC | PRN

## 2018-01-29 MED ORDER — SODIUM CHLORIDE 0.9 % IV SOLN: 250.0000 mL | INTRAVENOUS | Status: DC | PRN

## 2018-01-29 MED ORDER — RISAQUAD PO CAPS
1.0000 | ORAL_CAPSULE | Freq: Every day | ORAL | Status: DC
  Administered 2018-01-29 – 2018-02-02 (×5): 1 via ORAL
  Filled 2018-01-29 (×5): qty 1

## 2018-01-29 MED ORDER — ONDANSETRON HCL 4 MG PO TABS: 4.0000 mg | ORAL_TABLET | Freq: Four times a day (QID) | ORAL | Status: DC | PRN

## 2018-01-29 MED ORDER — FAMOTIDINE 20 MG PO TABS
20.0000 mg | ORAL_TABLET | Freq: Every day | ORAL | Status: DC
  Administered 2018-01-29 – 2018-02-02 (×5): 20 mg via ORAL
  Filled 2018-01-29 (×5): qty 1

## 2018-01-29 MED ORDER — VITAMIN B-12 1000 MCG PO TABS
1000.0000 ug | ORAL_TABLET | Freq: Every day | ORAL | Status: DC
  Administered 2018-01-29 – 2018-02-02 (×5): 1000 ug via ORAL
  Filled 2018-01-29 (×5): qty 1

## 2018-01-29 MED ORDER — SODIUM CHLORIDE 0.9% FLUSH
3.0000 mL | Freq: Two times a day (BID) | INTRAVENOUS | Status: DC
  Administered 2018-01-29 – 2018-02-02 (×9): 3 mL via INTRAVENOUS

## 2018-01-29 MED ORDER — METHYLPREDNISOLONE SODIUM SUCC 125 MG IJ SOLR
60.0000 mg | Freq: Three times a day (TID) | INTRAMUSCULAR | Status: DC
  Administered 2018-01-29 – 2018-01-31 (×6): 60 mg via INTRAVENOUS
  Filled 2018-01-29 (×6): qty 2

## 2018-01-29 MED ORDER — ALBUTEROL SULFATE (2.5 MG/3ML) 0.083% IN NEBU
10.0000 mg | INHALATION_SOLUTION | Freq: Once | RESPIRATORY_TRACT | Status: AC
  Administered 2018-01-29: 10 mg via RESPIRATORY_TRACT
  Filled 2018-01-29: qty 12

## 2018-01-29 MED ORDER — ONDANSETRON HCL 4 MG/2ML IJ SOLN: 4.0000 mg | Freq: Four times a day (QID) | INTRAMUSCULAR | Status: DC | PRN

## 2018-01-29 MED ORDER — GUAIFENESIN 100 MG/5ML PO SOLN
5.0000 mL | ORAL | Status: DC | PRN
  Administered 2018-01-29 – 2018-02-02 (×9): 100 mg via ORAL
  Filled 2018-01-29 (×10): qty 5

## 2018-01-29 MED ORDER — MONTELUKAST SODIUM 10 MG PO TABS
10.0000 mg | ORAL_TABLET | Freq: Every day | ORAL | Status: DC
  Administered 2018-01-29 – 2018-02-02 (×5): 10 mg via ORAL
  Filled 2018-01-29 (×5): qty 1

## 2018-01-29 MED ORDER — SENNOSIDES-DOCUSATE SODIUM 8.6-50 MG PO TABS: 1.0000 | ORAL_TABLET | Freq: Every evening | ORAL | Status: DC | PRN

## 2018-01-29 MED ORDER — IPRATROPIUM-ALBUTEROL 0.5-2.5 (3) MG/3ML IN SOLN
3.0000 mL | Freq: Once | RESPIRATORY_TRACT | Status: AC
  Administered 2018-01-29: 3 mL via RESPIRATORY_TRACT

## 2018-01-29 MED ORDER — MOMETASONE FURO-FORMOTEROL FUM 100-5 MCG/ACT IN AERO
2.0000 | INHALATION_SPRAY | Freq: Two times a day (BID) | RESPIRATORY_TRACT | Status: DC
  Administered 2018-01-29 – 2018-02-02 (×9): 2 via RESPIRATORY_TRACT
  Filled 2018-01-29: qty 8.8

## 2018-01-29 MED ORDER — ENOXAPARIN SODIUM 40 MG/0.4ML ~~LOC~~ SOLN
40.0000 mg | SUBCUTANEOUS | Status: DC
  Administered 2018-01-29 – 2018-01-31 (×3): 40 mg via SUBCUTANEOUS
  Filled 2018-01-29 (×4): qty 0.4

## 2018-01-29 MED ORDER — HYDROCODONE-ACETAMINOPHEN 5-325 MG PO TABS: 1.0000 | ORAL_TABLET | ORAL | Status: DC | PRN

## 2018-01-29 MED ORDER — DOXYCYCLINE HYCLATE 100 MG PO TABS
100.0000 mg | ORAL_TABLET | Freq: Two times a day (BID) | ORAL | Status: AC
  Administered 2018-01-29 – 2018-01-31 (×5): 100 mg via ORAL
  Filled 2018-01-29 (×5): qty 1

## 2018-01-29 MED ORDER — METHYLPREDNISOLONE SODIUM SUCC 125 MG IJ SOLR
60.0000 mg | Freq: Four times a day (QID) | INTRAMUSCULAR | Status: DC
  Administered 2018-01-29: 60 mg via INTRAVENOUS
  Filled 2018-01-29: qty 2

## 2018-01-29 MED ORDER — IPRATROPIUM-ALBUTEROL 0.5-2.5 (3) MG/3ML IN SOLN
3.0000 mL | Freq: Four times a day (QID) | RESPIRATORY_TRACT | Status: DC
  Administered 2018-01-29 – 2018-02-02 (×16): 3 mL via RESPIRATORY_TRACT
  Filled 2018-01-29 (×15): qty 3

## 2018-01-29 MED ORDER — METHYLPREDNISOLONE SODIUM SUCC 125 MG IJ SOLR
125.0000 mg | Freq: Once | INTRAMUSCULAR | Status: AC
  Administered 2018-01-29: 125 mg via INTRAVENOUS
  Filled 2018-01-29: qty 2

## 2018-01-29 MED ORDER — ORAL CARE MOUTH RINSE
15.0000 mL | Freq: Two times a day (BID) | OROMUCOSAL | Status: DC
  Administered 2018-01-29 – 2018-02-02 (×6): 15 mL via OROMUCOSAL

## 2018-01-29 MED ORDER — IPRATROPIUM-ALBUTEROL 0.5-2.5 (3) MG/3ML IN SOLN
RESPIRATORY_TRACT | Status: AC
  Filled 2018-01-29: qty 9

## 2018-01-29 MED ORDER — ACETAMINOPHEN 325 MG PO TABS: 650.0000 mg | ORAL_TABLET | Freq: Four times a day (QID) | ORAL | Status: DC | PRN

## 2018-01-29 MED ORDER — SODIUM CHLORIDE 0.9% FLUSH: 3.0000 mL | INTRAVENOUS | Status: DC | PRN

## 2018-01-29 MED ORDER — ALBUTEROL SULFATE (2.5 MG/3ML) 0.083% IN NEBU
2.5000 mg | INHALATION_SOLUTION | RESPIRATORY_TRACT | Status: DC | PRN
  Administered 2018-02-01: 2.5 mg via RESPIRATORY_TRACT
  Filled 2018-01-29: qty 3

## 2018-01-29 MED ORDER — ACETAMINOPHEN 650 MG RE SUPP: 650.0000 mg | Freq: Four times a day (QID) | RECTAL | Status: DC | PRN

## 2018-01-29 NOTE — H&P (Addendum)
[facility]   PATIENT NAME: Stacey Durham    MR#:  [id number]  DATE OF BIRTH:  Mar 13, 1946  DATE OF ADMISSION:  01/29/2018  PRIMARY CARE PHYSICIAN: Eustaquio BoydenGutierrez, Javier, MD   REQUESTING/REFERRING PHYSICIAN: Nita SickleVeronese, [place], MD  CHIEF COMPLAINT:   Chief Complaint  Patient presents with  . Shortness of Breath   Worsening shortness of breath for 3 days. HISTORY OF PRESENT ILLNESS:  Stacey Durham  is a 72 y.o. female with a known history of COPD, pneumonia, cholecystitis and cholelithiasis.  The patient presented to ED with above chief complaints.  She has had worsening shortness of breath, wheezing and a cough for 3 days.  He also complains some fever and chest tightness but no chills.  She is found hypoxia and put on high flow.  ED physician suggest put on BiPAP but the patient refused BIPAP.  PAST MEDICAL HISTORY:   Past Medical History:  Diagnosis Date  . Acute cholecystitis   . Cholecystitis with cholelithiasis 10/11/2015  . COPD (chronic obstructive pulmonary disease) (HCC)   . Ex-smoker    quit 2000s  . Pneumonia 11/26/2015    PAST SURGICAL HISTORY:   Past Surgical History:  Procedure Laterality Date  . ABDOMINAL HYSTERECTOMY  1978   part of an ovary remained  . APPENDECTOMY  1978  . CATARACT EXTRACTION, BILATERAL  2013  . CHOLECYSTECTOMY N/A 10/11/2015   Procedure: LAPAROSCOPIC CHOLECYSTECTOMY;  Surgeon: Leafy Roiego F Pabon, MD;  Location: [facility] ORS;  Service: General;  Laterality: N/A;  . COLONOSCOPY  07/2016   HP, ext hem, diverticulosis, rpt 5 yrs (Outlaw)  . TONSILLECTOMY      SOCIAL HISTORY:   Social History   Tobacco Use  . Smoking status: Former Smoker    Packs/day: 1.00    Years: 40.00    Pack years: 40.00    Last attempt to quit: 06/01/2001    Years since quitting: 16.6  . Smokeless tobacco: Never Used  Substance Use Topics  . Alcohol use: No    FAMILY HISTORY:   Family History  Problem Relation  Age of Onset  . Heart attack Father [id number]  . Diabetes Father   . Diabetes Mother   . Stroke Neg Hx   . Cancer Neg Hx     DRUG ALLERGIES:  No Known Allergies  REVIEW OF SYSTEMS:   Review of Systems  Constitutional: Negative for chills, fever and malaise/fatigue.  HENT: Negative for sore throat.   Eyes: Negative for blurred vision and double vision.  Respiratory: Positive for cough, shortness of breath and wheezing. Negative for hemoptysis, sputum production and stridor.   Cardiovascular: Negative for chest pain, palpitations, orthopnea and leg swelling.  Gastrointestinal: Negative for abdominal pain, blood in stool, diarrhea, melena, nausea and vomiting.  Genitourinary: Negative for dysuria, flank pain and hematuria.  Musculoskeletal: Negative for back pain and joint pain.  Skin: Negative for rash.  Neurological: Negative for dizziness, sensory change, focal weakness, seizures, loss of consciousness, weakness and headaches.  Endo/Heme/Allergies: Negative for polydipsia.  Psychiatric/Behavioral: Negative for depression. The patient is nervous/anxious.     MEDICATIONS AT HOME:   Prior to Admission medications   Medication Sig Start Date End Date Taking? Authorizing Provider  albuterol (PROVENTIL HFA;VENTOLIN HFA) 108 (90 Base) MCG/ACT inhaler Inhale 2 puffs into the lungs every 4 (four) hours as needed for wheezing or shortness of breath. 09/15/17  Yes Kasa, Wallis BambergKurian, MD  albuterol (PROVENTIL) (2.5 MG/3ML) 0.083% nebulizer solution TAKE 3 MLS (  2.5 MG TOTAL) BY NEBULIZATION EVERY 4 (FOUR) HOURS AS NEEDED. Patient taking differently: Take 2.5 mg by nebulization every 4 (four) hours as needed for wheezing or shortness of breath.  08/18/17  Yes Kasa, Wallis Bamberg, MD  Biotin 16109 MCG TABS Take 1 tablet by mouth.   Yes [provider]  calcium-vitamin D (OSCAL WITH D) 500-200 MG-UNIT tablet Take 1 tablet by mouth.   Yes [provider]  Dextromethorphan-Guaifenesin (MUCINEX DM PO)  Take 1 tablet by mouth daily.    Yes [provider]  montelukast (SINGULAIR) 10 MG tablet TAKE 1 TABLET BY MOUTH EVERY DAY 10/26/17  Yes Eustaquio Boyden, MD  Multiple Vitamins-Minerals (MULTIVITAMIN ADULT PO) Take 1 tablet by mouth.   Yes [provider]  Probiotic Product (PROBIOTIC-10 PO) Take 1 tablet by mouth.   Yes [provider]  Respiratory Therapy Supplies (FLUTTER) DEVI 1 each by Does not apply route once. 11/26/15  Yes Mungal, Vishal, MD  vitamin B-12 (CYANOCOBALAMIN) 1000 MCG tablet Take 1,000 mcg by mouth daily.   Yes [provider]  umeclidinium bromide (INCRUSE ELLIPTA) 62.5 MCG/INH AEPB Inhale 1 puff into the lungs daily. Patient not taking: Reported on 01/29/2018 08/20/17   Erin Fulling, MD      VITAL SIGNS:  Blood pressure 115/74, pulse 77, resp. rate (!) 21, weight 65.3 kg, SpO2 97 %.  PHYSICAL EXAMINATION:  Physical Exam  GENERAL:  72 y.o.-year-old patient lying in the bed with no acute distress.  EYES: Pupils equal, round, reactive to light and accommodation. No scleral icterus. Extraocular muscles intact.  HEENT: Head atraumatic, normocephalic. Oropharynx and nasopharynx clear.  NECK:  Supple, no jugular venous distention. No thyroid enlargement, no tenderness.  LUNGS: Bilateral severe expiratory wheezing, no rales,rhonchi or crepitation. No use of accessory muscles of respiration.  CARDIOVASCULAR: S1, S2 normal. No murmurs, rubs, or gallops.  ABDOMEN: Soft, nontender, nondistended. Bowel sounds present. No organomegaly or mass.  EXTREMITIES: No pedal edema, cyanosis, or clubbing.  NEUROLOGIC: Cranial nerves II through XII are intact. Muscle strength 5/5 in all extremities. Sensation intact. Gait not checked.  PSYCHIATRIC: The patient is alert and oriented x 3.  SKIN: No obvious rash, lesion, or ulcer.   LABORATORY PANEL:   CBC Recent Labs  Lab 01/29/18 0751  WBC 6.4  HGB 14.1  HCT 41.4  PLT 257    ------------------------------------------------------------------------------------------------------------------  Chemistries  Recent Labs  Lab 01/29/18 0751  NA 141  K 3.8  CL 105  CO2 27  GLUCOSE 130*  BUN 22  CREATININE 0.94  CALCIUM 9.0   ------------------------------------------------------------------------------------------------------------------  Cardiac Enzymes Recent Labs  Lab 01/29/18 0751  TROPONINI <0.03   ------------------------------------------------------------------------------------------------------------------  RADIOLOGY:  Dg Chest Portable 1 View  Result Date: 01/29/2018 CLINICAL DATA:  Cough and shortness of breath for 2 weeks. Symptoms have worsened over the past 2 days. EXAM: PORTABLE CHEST 1 VIEW COMPARISON:  Lung cancer screening CT 01/25/2017. PA and lateral chest 08/06/2017 and 10/29/2016. FINDINGS: Mild linear atelectasis or scar seen in the right lung base. The lungs are otherwise clear. Emphysema is seen. Heart size is normal. Aortic atherosclerosis is noted. No acute or focal bony abnormality. IMPRESSION: Emphysema without acute disease. Electronically Signed   By: Drusilla Kanner M.D.   On: 01/29/2018 08:16      IMPRESSION AND PLAN:   Acute respiratory failure with hypoxia due to COPD exacerbation. The patient will be admitted to stepdown unit. Continue high flow, BiPAP as needed. Continue IV cell Medrol, DuoNeb every 6  hours, Dulera twice daily and Robitussin as needed. Intensivist consult.  Called Dr. Kristen Cardinal and discussed with him. All the records are reviewed and case discussed with ED provider. Management plans discussed with the patient, family and they are in agreement.  CODE STATUS: Full code.  TOTAL TIME TAKING CARE OF THIS PATIENT: 42 minutes.    Shaune Pollack M.D on 01/29/2018 at 10:11 AM  Between 7am to 6pm - Pager - [phone]  After 6pm go to [email or web address] - Social research officer, government  [facility] [place]  [facility]  Office  [phone]  CC: Primary care physician; Eustaquio Boyden, MD   Note: This dictation was prepared with Dragon dictation along with smaller phrase technology. Any transcriptional errors that result from this process are unin

## 2018-01-29 NOTE — ED Notes (Signed)
Waiting on RT to take pt up to CCU 6.

## 2018-01-29 NOTE — Progress Notes (Signed)
Attempted to place pt on BiPAP, pt unable to tol mask. Pt stated she is very claustrophobic. MD aware. HFNC order

## 2018-01-29 NOTE — ED Notes (Signed)
Attempted to call report on pt. Was informed to call back.

## 2018-01-29 NOTE — Consult Note (Signed)
Name: Stacey Durham MRN: [id number] DOB: 04/08/1946     CONSULTATION DATE: 01/29/2018   HISTORY OF PRESENT ILLNESS:  72 years old lady with history of chronic obstructive pulmonary disease.  The patient presented to the ED with worsening shortness of breath and wheezes for the last 3 days.  The patient did not tolerate BiPAP while in the ED, she was placed on high flow nasal cannula and admitted to the intensive care unit. Patient arrived to ICU on high flow nasal cannula 35%, no distress. All history was obtained from admitting physician, the patient and EMR  PAST MEDICAL HISTORY :   has a past medical history of Acute cholecystitis, Cholecystitis with cholelithiasis (10/11/2015), COPD (chronic obstructive pulmonary disease) (HCC), Ex-smoker, and Pneumonia (11/26/2015).  has a past surgical history that includes Abdominal hysterectomy (1978); Tonsillectomy; Appendectomy (1978); Cholecystectomy (N/A, 10/11/2015); Cataract extraction, bilateral (2013); and Colonoscopy (07/2016). Prior to Admission medications   Medication Sig Start Date End Date Taking? Authorizing Provider  albuterol (PROVENTIL HFA;VENTOLIN HFA) 108 (90 Base) MCG/ACT inhaler Inhale 2 puffs into the lungs every 4 (four) hours as needed for wheezing or shortness of breath. 09/15/17  Yes Kasa, Wallis Bamberg, MD  albuterol (PROVENTIL) (2.5 MG/3ML) 0.083% nebulizer solution TAKE 3 MLS (2.5 MG TOTAL) BY NEBULIZATION EVERY 4 (FOUR) HOURS AS NEEDED. Patient taking differently: Take 2.5 mg by nebulization every 4 (four) hours as needed for wheezing or shortness of breath.  08/18/17  Yes Kasa, Wallis Bamberg, MD  Biotin 40981 MCG TABS Take 1 tablet by mouth.   Yes [provider]  calcium-vitamin D (OSCAL WITH D) 500-200 MG-UNIT tablet Take 1 tablet by mouth.   Yes [provider]  Dextromethorphan-Guaifenesin (MUCINEX DM PO) Take 1 tablet by mouth daily.    Yes [provider]  montelukast (SINGULAIR) 10 MG tablet TAKE 1  TABLET BY MOUTH EVERY DAY 10/26/17  Yes Eustaquio Boyden, MD  Multiple Vitamins-Minerals (MULTIVITAMIN ADULT PO) Take 1 tablet by mouth.   Yes [provider]  Probiotic Product (PROBIOTIC-10 PO) Take 1 tablet by mouth.   Yes [provider]  Respiratory Therapy Supplies (FLUTTER) DEVI 1 each by Does not apply route once. 11/26/15  Yes Mungal, Vishal, MD  vitamin B-12 (CYANOCOBALAMIN) 1000 MCG tablet Take 1,000 mcg by mouth daily.   Yes [provider]  umeclidinium bromide (INCRUSE ELLIPTA) 62.5 MCG/INH AEPB Inhale 1 puff into the lungs daily. Patient not taking: Reported on 01/29/2018 08/20/17   Erin Fulling, MD   No Known Allergies  FAMILY HISTORY:  family history includes Diabetes in her father and mother; Heart attack (age of onset: 55) in her father. SOCIAL HISTORY:  reports that she quit smoking about 16 years ago. She has a 40.00 pack-year smoking history. She has never used smokeless tobacco. She reports that she does not drink alcohol or use drugs.  REVIEW OF SYSTEMS:   Unable to obtain due to critical illness   VITAL SIGNS: Temp:  [97.7 F (36.5 C)] 97.7 F (36.5 C) (08/31 1230) Pulse Rate:  [77-95] 92 (08/31 1230) Resp:  [11-22] 17 (08/31 1230) BP: (107-138)/(64-83) 138/78 (08/31 1230) SpO2:  [93 %-100 %] 99 % (08/31 1230) FiO2 (%):  [35 %] 35 % (08/31 0852) Weight:  [65.3 kg-66.1 kg] 66.1 kg (08/31 1230)  Physical Examination:  Awake and oriented with no focal neurological deficits Tolerating high flow nasal cannula 35%, no distress, able to talk in full sentences, bilateral equal air entry with no adventitious sounds S1 & S2  are audible with no murmur Benign abdominal exam with normal peristalsis Within normal extremities and no edema  ASSESSMENT / PLAN:  Acute respiratory failure tolerating high flow nasal cannula 35% -Monitor work of breathing and ABG.  Consider PPV if needed  Acute exacerbation of COPD -Bronchodilators + tapering  systemic steroids + empiric doxycycline.  Singulair home medication -Follows procalcitonin and consider discontinuing antimicrobial if sepsis is ruled out.  Full code  DVT & GI prophylaxis.  Continue with supportive care  Critical care time 35 minutes

## 2018-01-29 NOTE — ED Notes (Signed)
ICU not able to take report at this time. Charge rn aware.

## 2018-01-29 NOTE — Plan of Care (Signed)

## 2018-01-29 NOTE — Progress Notes (Signed)
Advanced Care Plan.  Purpose of Encounter: CODE STATUS. Parties in Attendance: The patient and me. Patient's Decisional Capacity: Yes. Medical Story: Stacey Durham  is a 72 y.o. female with a known history of COPD, pneumonia, cholecystitis and cholelithiasis.  She is admitted for acute respiratory failure with hypoxia due to COPD exacerbation.  She is put on high flow oxygen and admitted to stepdown unit.  I discussed the patient's current condition, prognosis and CODE STATUS.  The patient stated she want to be resuscitated and intubated but not want to be on machine long-term. Plan:  Code Status: Full code Time spent discussing advance care planning: 16-17 minutes.

## 2018-01-29 NOTE — ED Provider Notes (Signed)
[facility] Emergency Department Provider Note  ____________________________________________  Time seen: Approximately 7:53 AM  I have reviewed the triage vital signs and the nursing notes.   HISTORY  Chief Complaint Shortness of Breath   HPI Stacey Durham is a 72 y.o. female with a history of COPD and former smoking who presents for evaluation of respiratory distress.  Patient reports 2 to 3 days of progressively worsening wheezing and shortness of breath.  This morning the shortness of breath became severe and constant.  Breathing treatments were not helping this morning.  She has had no fever or chills, no chest pain, no nausea or vomiting.  She reports a week of cough productive of yellow sputum.  Past Medical History:  Diagnosis Date  . Acute cholecystitis   . Cholecystitis with cholelithiasis 10/11/2015  . COPD (chronic obstructive pulmonary disease) (HCC)   . Ex-smoker    quit 2000s  . Pneumonia 11/26/2015    Patient Active Problem List   Diagnosis Date Noted  . Osteopenia 09/25/2017  . Pulmonary nodule 02/02/2017  . Neck pain 01/30/2017  . Head injury, acute, initial encounter 10/29/2016  . Health maintenance examination 06/26/2016  . Advanced care planning/counseling discussion 06/26/2016  . Incontinence of feces with fecal urgency 06/26/2016  . HLD (hyperlipidemia) 06/26/2016  . Prediabetes 06/26/2016  . Infected epidermoid cyst 01/23/2016  . Hair loss 01/07/2016  . Subclinical hypothyroidism 01/07/2016  . Aortic atherosclerosis (HCC) 01/07/2016  . CAD (coronary artery disease) 01/07/2016  . COPD exacerbation (HCC) 11/26/2015  . Ex-smoker 09/25/2015  . Chronic obstructive pulmonary disease (HCC) 08/16/2015    Past Surgical History:  Procedure Laterality Date  . ABDOMINAL HYSTERECTOMY  1978   part of an ovary remained  . APPENDECTOMY  1978  . CATARACT EXTRACTION, BILATERAL  2013  . CHOLECYSTECTOMY N/A 10/11/2015   Procedure: LAPAROSCOPIC CHOLECYSTECTOMY;  Surgeon: Leafy Ro, MD;  Location: [facility] ORS;  Service: General;  Laterality: N/A;  . COLONOSCOPY  07/2016   HP, ext hem, diverticulosis, rpt 5 yrs (Outlaw)  . TONSILLECTOMY      Prior to Admission medications   Medication Sig Start Date End Date Taking? Authorizing Provider  albuterol (PROVENTIL HFA;VENTOLIN HFA) 108 (90 Base) MCG/ACT inhaler Inhale 2 puffs into the lungs every 4 (four) hours as needed for wheezing or shortness of breath. 09/15/17   Erin Fulling, MD  albuterol (PROVENTIL) (2.5 MG/3ML) 0.083% nebulizer solution TAKE 3 MLS (2.5 MG TOTAL) BY NEBULIZATION EVERY 4 (FOUR) HOURS AS NEEDED. 08/18/17   Erin Fulling, MD  Biotin 16109 MCG TABS Take 1 tablet by mouth.    [provider]  calcium-vitamin D (OSCAL WITH D) 500-200 MG-UNIT tablet Take 1 tablet by mouth.    [provider]  Dextromethorphan-Guaifenesin [facility] DM PO) Take by mouth daily.    [provider]  montelukast (SINGULAIR) 10 MG tablet TAKE 1 TABLET BY MOUTH EVERY DAY 10/26/17   Eustaquio Boyden, MD  Multiple Vitamins-Minerals (MULTIVITAMIN ADULT PO) Take 1 tablet by mouth.    [provider]  Probiotic Product (PROBIOTIC-10 PO) Take 1 tablet by mouth.    [provider]  Respiratory Therapy Supplies (FLUTTER) DEVI 1 each by Does not apply route once. 11/26/15   Mungal, Eston Esters, MD  umeclidinium bromide (INCRUSE ELLIPTA) 62.5 MCG/INH AEPB Inhale 1 puff into the lungs daily. 08/20/17   Erin Fulling, MD  vitamin B-12 (CYANOCOBALAMIN) 1000 MCG tablet Take 1,000 mcg by mouth daily.    [provider]  Allergies Patient has no known allergies.  Family History  Problem Relation Age of Onset  . Heart attack Father 67  . Diabetes Father   . Diabetes Mother   . Stroke Neg Hx   . Cancer Neg Hx     Social History Social History   Tobacco Use  . Smoking status: Former Smoker    Packs/day: 1.00    Years: 40.00    Pack  years: 40.00    Last attempt to quit: 06/01/2001    Years since quitting: 16.6  . Smokeless tobacco: Never Used  Substance Use Topics  . Alcohol use: No  . Drug use: No    Review of Systems  Constitutional: Negative for fever. Eyes: Negative for visual changes. ENT: Negative for sore throat. Neck: No neck pain  Cardiovascular: Negative for chest pain. Respiratory: + shortness of breath, cough Gastrointestinal: Negative for abdominal pain, vomiting or diarrhea. Genitourinary: Negative for dysuria. Musculoskeletal: Negative for back pain. Skin: Negative for rash. Neurological: Negative for headaches, weakness or numbness. Psych: No SI or HI  ____________________________________________   PHYSICAL EXAM:  VITAL SIGNS: ED Triage Vitals  Enc Vitals Group     BP --      Pulse Rate 01/29/18 0738 87     Resp 01/29/18 0738 (!) 22     Temp --      Temp src --      SpO2 01/29/18 0738 94 %     Weight 01/29/18 0739 143 lb 15.4 oz (65.3 kg)     Height --      Head Circumference --      Peak Flow --      Pain Score 01/29/18 0739 0     Pain Loc --      Pain Edu? --      Excl. in GC? --     Constitutional: Alert and oriented. Well appearing and in no apparent distress. HEENT:      Head: Normocephalic and atraumatic.         Eyes: Conjunctivae are normal. Sclera is non-icteric.       Mouth/Throat: Mucous membranes are moist.       Neck: Supple with no signs of meningismus. Cardiovascular: Regular rate and rhythm. No murmurs, gallops, or rubs. 2+ symmetrical distal pulses are present in all extremities. No JVD. Respiratory: Moderate respiratory distress, tachypneic, satting 95% on room air with diffuse wheezes bilaterally, patient is also using accessory muscles of respiration.   Gastrointestinal: Soft, non tender, and non distended with positive bowel sounds. No rebound or guarding.  Musculoskeletal: Nontender with normal range of motion in all extremities. No edema, cyanosis, or  erythema of extremities. Neurologic: Normal speech and language. Face is symmetric. Moving all extremities. No gross focal neurologic deficits are appreciated. Skin: Skin is warm, dry and intact. No rash noted. Psychiatric: Mood and affect are normal. Speech and behavior are normal.  ____________________________________________   LABS (all labs ordered are listed, but only abnormal results are displayed)  Labs Reviewed  BASIC METABOLIC PANEL - Abnormal; Notable for the following components:      Result Value   Glucose, Bld 130 (*)    GFR calc non Af Amer 59 (*)    All other components within normal limits  BLOOD GAS, VENOUS - Abnormal; Notable for the following components:   pO2, Ven <31.0 (*)    Bicarbonate 28.3 (*)    Acid-Base Excess 2.1 (*)    All other components within normal limits  CBC WITH DIFFERENTIAL/PLATELET  TROPONIN I   ____________________________________________  EKG  ED ECG REPORT I, Nita Sickle, the attending physician, personally viewed and interpreted this ECG.  Normal sinus rhythm, rate of 79, normal intervals, normal axis, no ST elevations or depressions, T wave inversion in anterior leads.  Unchanged from prior from 2018 ____________________________________________  RADIOLOGY  I have personally reviewed the images performed during this visit and I agree with the Radiologist's read.   Interpretation by Radiologist:  Dg Chest Portable 1 View  Result Date: 01/29/2018 CLINICAL DATA:  Cough and shortness of breath for 2 weeks. Symptoms have worsened over the past 2 days. EXAM: PORTABLE CHEST 1 VIEW COMPARISON:  Lung cancer screening CT 01/25/2017. PA and lateral chest 08/06/2017 and 10/29/2016. FINDINGS: Mild linear atelectasis or scar seen in the right lung base. The lungs are otherwise clear. Emphysema is seen. Heart size is normal. Aortic atherosclerosis is noted. No acute or focal bony abnormality. IMPRESSION: Emphysema without acute disease.  Electronically Signed   By: Drusilla Kanner M.D.   On: 01/29/2018 08:16     ____________________________________________   PROCEDURES  Procedure(s) performed: None Procedures Critical Care performed: yes  CRITICAL CARE Performed by: Nita Sickle  ?  Total critical care time: 35 min  Critical care time was exclusive of separately billable procedures and treating other patients.  Critical care was necessary to treat or prevent imminent or life-threatening deterioration.  Critical care was time spent personally by me on the following activities: development of treatment plan with patient and/or surrogate as well as nursing, discussions with consultants, evaluation of patient's response to treatment, examination of patient, obtaining history from patient or surrogate, ordering and performing treatments and interventions, ordering and review of laboratory studies, ordering and review of radiographic studies, pulse oximetry and re-evaluation of patient's condition.  ____________________________________________   INITIAL IMPRESSION / ASSESSMENT AND PLAN / ED COURSE  72 y.o. female with a history of COPD and former smoking who presents for evaluation of respiratory distress.  Patient arrives in moderate respiratory distress due to COPD exacerbation.  She is not hypoxic but is tachypneic with accessory muscles of respiration and diffuse wheezing bilaterally.  No history of CHF and patient looks euvolemic on exam.  No history of blood clots, no leg swelling, no risk factors for PE.  Chest x-ray is pending to rule out pneumonia.  Patient was started on 3 DuoNeb's and Solu-Medrol.  Labs are pending.  Clinical Course as of Jan 29 846  Sat Jan 29, 2018  8119 After 3 DuoNeb's patient with persistently increased work of breathing and wheezing.  I ordered BiPAP however patient is unable to tolerate due to severe claustrophobia.  Patient was started on 10 mg of albuterol an hour and will  place patient on high flow nasal cannula.  VBG showing normal pH of 7.37 with a PCO2 of 49.   [CV]    Clinical Course User Index [CV] Don Perking [place], MD   _________________________ 8:47 AM on 01/29/2018 -----------------------------------------  Labs and CXR WNL. Will admit to Hospitalist  As part of my medical decision making, I reviewed the following data within the electronic MEDICAL RECORD NUMBER Nursing notes reviewed and incorporated, Labs reviewed , EKG interpreted , Old EKG reviewed, Old chart reviewed, Radiograph reviewed , Discussed with admitting physician , Notes from prior ED visits and [place] Controlled Substance Database    Pertinent labs & imaging results that were available during my care of the patient were reviewed by me and considered  in my medical decision making (see chart for details).    ____________________________________________   FINAL CLINICAL IMPRESSION(S) / ED DIAGNOSES  Final diagnoses:  COPD exacerbation (HCC)  Respiratory distress      NEW MEDICATIONS STARTED DURING THIS VISIT:  ED Discharge Orders    None       Note:  This document was prepared using Dragon voice recognition software and may include unintentional dictation errors.    Nita SickleVeronese, [place], MD 01/29/18 [id number]

## 2018-01-29 NOTE — ED Notes (Signed)
Date and time results received: 01/29/18 0807  Test: p02 Critical Value: 31  Name of Provider Notified: Don PerkingVeronese  Orders Received? Or Actions Taken?: will continue to monitor

## 2018-01-29 NOTE — ED Triage Notes (Signed)
C/O sob and cough for a few weeks, but worse over past 2 days.    Respirations labored.  Audible wheezing heard.  RA sat 94%

## 2018-01-29 NOTE — Progress Notes (Signed)
Patient admitted from ER. Pt AAOX4, O2 HFNC @ 35%.Marland Kitchen.o2 sats 98. VSS. No complaints of pain. MRSA swab and CHG completed. Pt given call bell and bed in lowest postion.

## 2018-01-30 LAB — BASIC METABOLIC PANEL
Anion gap: 10 (ref 5–15)
BUN: 25 mg/dL — ABNORMAL HIGH (ref 8–23)
CALCIUM: 8.9 mg/dL (ref 8.9–10.3)
CO2: 22 mmol/L (ref 22–32)
Chloride: 107 mmol/L (ref 98–111)
Creatinine, Ser: 1.09 mg/dL — ABNORMAL HIGH (ref 0.44–1.00)
GFR calc non Af Amer: 49 mL/min — ABNORMAL LOW (ref >60)
GFR, EST AFRICAN AMERICAN: 57 mL/min — AB (ref >60)
Glucose, Bld: 182 mg/dL — ABNORMAL HIGH (ref 70–99)
Potassium: 3.7 mmol/L (ref 3.5–5.1)
Sodium: 139 mmol/L (ref 135–145)

## 2018-01-30 LAB — CBC
HCT: 38.9 % (ref 35.0–47.0)
HEMOGLOBIN: 13.6 g/dL (ref 12.0–16.0)
MCH: 31.6 pg (ref 26.0–34.0)
MCHC: 35 g/dL (ref 32.0–36.0)
MCV: 90.3 fL (ref 80.0–100.0)
Platelets: 241 10*3/uL (ref 150–440)
RBC: 4.31 MIL/uL (ref 3.80–5.20)
RDW: 12.7 % (ref 11.5–14.5)
WBC: 11.9 10*3/uL — ABNORMAL HIGH (ref 3.6–11.0)

## 2018-01-30 MED ORDER — FAMOTIDINE 20 MG PO TABS
10.0000 mg | ORAL_TABLET | Freq: Once | ORAL | Status: DC
  Filled 2018-01-30: qty 1

## 2018-01-30 MED ORDER — FAMOTIDINE 20 MG PO TABS
20.0000 mg | ORAL_TABLET | Freq: Once | ORAL | Status: AC
  Administered 2018-01-30: 20 mg via ORAL

## 2018-01-30 NOTE — Progress Notes (Signed)
[facility] - [place] at [facility]   PATIENT NAME: Stacey Durham    MR#:  [id number]  DATE OF BIRTH:  1946-02-08  SUBJECTIVE:  CHIEF COMPLAINT:   Chief Complaint  Patient presents with  . Shortness of Breath   Better shortness of breath and wheezing, still on high flow oxygen. REVIEW OF SYSTEMS:  Review of Systems  Constitutional: Positive for malaise/fatigue. Negative for chills and fever.  HENT: Negative for sore throat.   Eyes: Negative for blurred vision and double vision.  Respiratory: Positive for cough, hemoptysis, shortness of breath and wheezing. Negative for sputum production and stridor.   Cardiovascular: Negative for chest pain, palpitations, orthopnea and leg swelling.  Gastrointestinal: Negative for abdominal pain, blood in stool, diarrhea, melena, nausea and vomiting.  Genitourinary: Negative for dysuria, flank pain and hematuria.  Musculoskeletal: Negative for back pain and joint pain.  Skin: Negative for rash.  Neurological: Negative for dizziness, sensory change, focal weakness, seizures, loss of consciousness, weakness and headaches.  Endo/Heme/Allergies: Negative for polydipsia.  Psychiatric/Behavioral: Negative for depression. The patient is nervous/anxious.     DRUG ALLERGIES:  No Known Allergies VITALS:  Blood pressure 121/62, pulse 83, temperature 97.6 F (36.4 C), temperature source Oral, resp. rate 20, height 5\' 4"  (1.626 m), weight 66.1 kg, SpO2 96 %. PHYSICAL EXAMINATION:  Physical Exam  Constitutional: She is oriented to person, place, and time. She appears well-developed.  HENT:  Head: Normocephalic.  Mouth/Throat: Oropharynx is clear and moist.  Eyes: Pupils are equal, round, and reactive to light. Conjunctivae and EOM are normal. No scleral icterus.  Neck: Normal range of motion. Neck supple. No JVD present. No tracheal deviation present.  Cardiovascular: Normal rate, regular rhythm and normal heart sounds. Exam  reveals no gallop.  No murmur heard. Pulmonary/Chest: Effort normal. No stridor. No respiratory distress. She has wheezes. She has no rales.  Abdominal: Soft. Bowel sounds are normal. She exhibits no distension. There is no tenderness. There is no rebound.  Musculoskeletal: Normal range of motion. She exhibits no edema or tenderness.  Neurological: She is alert and oriented to person, place, and time. No cranial nerve deficit.  Skin: No rash noted. No erythema.  Psychiatric: She has a normal mood and affect.   LABORATORY PANEL:  Female CBC Recent Labs  Lab 01/30/18 0511  WBC 11.9*  HGB 13.6  HCT 38.9  PLT 241   ------------------------------------------------------------------------------------------------------------------ Chemistries  Recent Labs  Lab 01/29/18 0751 01/30/18 0511  NA 141 139  K 3.8 3.7  CL 105 107  CO2 27 22  GLUCOSE 130* 182*  BUN 22 25*  CREATININE 0.94 1.09*  CALCIUM 9.0 8.9  MG 2.2  --    RADIOLOGY:  No results found. ASSESSMENT AND PLAN:   Acute respiratory failure with hypoxia due to COPD exacerbation. Continue high flow, BiPAP as needed. Continue IV solumedrol, DuoNeb every 6 hours, Dulera twice daily and Robitussin as needed. empiric doxycycline per Dr. Duanne Limerick.  Dehydration.  Encourage fluid intake. All the records are reviewed and case discussed with Care Management/Social Worker. Management plans discussed with the patient, family and they are in agreement.  CODE STATUS: Full Code  TOTAL TIME TAKING CARE OF THIS PATIENT: 33 minutes.   More than 50% of the time was spent in counseling/coordination of care: YES  POSSIBLE D/C IN 3 DAYS, DEPENDING ON CLINICAL CONDITION.   Shaune Pollack M.D on 01/30/2018 at 12:30 PM  Between 7am to 6pm - Pager - [phone]  After 6pm  go to [email or web address] - [facility]

## 2018-01-31 MED ORDER — METHYLPREDNISOLONE SODIUM SUCC 40 MG IJ SOLR
40.0000 mg | Freq: Two times a day (BID) | INTRAMUSCULAR | Status: DC
  Administered 2018-01-31 – 2018-02-02 (×4): 40 mg via INTRAVENOUS
  Filled 2018-01-31 (×4): qty 1

## 2018-01-31 NOTE — Progress Notes (Signed)
[facility] - [place] at [facility]   PATIENT NAME: Stacey Durham    MR#:  [id number]  DATE OF BIRTH:  08-Nov-1945  SUBJECTIVE:  CHIEF COMPLAINT:   Chief Complaint  Patient presents with  . Shortness of Breath   Better shortness of breath and wheezing, on O2 [place] 3 L, off high flow oxygen. REVIEW OF SYSTEMS:  Review of Systems  Constitutional: Negative for chills, fever and malaise/fatigue.  HENT: Negative for sore throat.   Eyes: Negative for blurred vision and double vision.  Respiratory: Positive for cough, shortness of breath and wheezing. Negative for hemoptysis, sputum production and stridor.   Cardiovascular: Negative for chest pain, palpitations, orthopnea and leg swelling.  Gastrointestinal: Negative for abdominal pain, blood in stool, diarrhea, melena, nausea and vomiting.  Genitourinary: Negative for dysuria, flank pain and hematuria.  Musculoskeletal: Negative for back pain and joint pain.  Skin: Negative for rash.  Neurological: Negative for dizziness, sensory change, focal weakness, seizures, loss of consciousness, weakness and headaches.  Endo/Heme/Allergies: Negative for polydipsia.  Psychiatric/Behavioral: Negative for depression. The patient is not nervous/anxious.     DRUG ALLERGIES:  No Known Allergies VITALS:  Blood pressure (!) 144/73, pulse 84, temperature 97.8 F (36.6 C), temperature source Oral, resp. rate 20, height 5\' 4"  (1.626 m), weight 66.1 kg, SpO2 94 %. PHYSICAL EXAMINATION:  Physical Exam  Constitutional: She is oriented to person, place, and time. She appears well-developed.  HENT:  Head: Normocephalic.  Mouth/Throat: Oropharynx is clear and moist.  Eyes: Pupils are equal, round, and reactive to light. Conjunctivae and EOM are normal. No scleral icterus.  Neck: Normal range of motion. Neck supple. No JVD present. No tracheal deviation present.  Cardiovascular: Normal rate, regular rhythm and normal heart sounds. Exam  reveals no gallop.  No murmur heard. Pulmonary/Chest: Effort normal. No stridor. No respiratory distress. She has wheezes. She has no rales.  Abdominal: Soft. Bowel sounds are normal. She exhibits no distension. There is no tenderness. There is no rebound.  Musculoskeletal: Normal range of motion. She exhibits no edema or tenderness.  Neurological: She is alert and oriented to person, place, and time. No cranial nerve deficit.  Skin: No rash noted. No erythema.  Psychiatric: She has a normal mood and affect.   LABORATORY PANEL:  Female CBC Recent Labs  Lab 01/30/18 0511  WBC 11.9*  HGB 13.6  HCT 38.9  PLT 241   ------------------------------------------------------------------------------------------------------------------ Chemistries  Recent Labs  Lab 01/29/18 0751 01/30/18 0511  NA 141 139  K 3.8 3.7  CL 105 107  CO2 27 22  GLUCOSE 130* 182*  BUN 22 25*  CREATININE 0.94 1.09*  CALCIUM 9.0 8.9  MG 2.2  --    RADIOLOGY:  No results found. ASSESSMENT AND PLAN:   Acute respiratory failure with hypoxia due to COPD exacerbation. Continue O2 [place], off high flow. Taper IV solumedrol, DuoNeb prn, Dulera twice daily and Robitussin as needed. empiric doxycycline completed.  Dehydration.  Encourage fluid intake.  All the records are reviewed and case discussed with Care Management/Social Worker. Management plans discussed with the patient, family and they are in agreement.  CODE STATUS: Full Code  TOTAL TIME TAKING CARE OF THIS PATIENT: 33 minutes.   More than 50% of the time was spent in counseling/coordination of care: YES  POSSIBLE D/C IN 2 DAYS, DEPENDING ON CLINICAL CONDITION.   Shaune Pollack M.D on 01/31/2018 at 1:40 PM  Between 7am to 6pm - Pager - [phone]  After  6pm go to [email or web address] - [facility]

## 2018-01-31 NOTE — Progress Notes (Signed)
Telephone report called to Juan,RN on 2C.  Patient to be moved from ICU 6 to room 205 via wheelchair.

## 2018-01-31 NOTE — Progress Notes (Signed)
Patient taken to room 205 via wheelchair by RN.

## 2018-02-01 MED ORDER — FLUTICASONE-SALMETEROL 250-50 MCG/DOSE IN AEPB: 1.0000 | INHALATION_SPRAY | Freq: Two times a day (BID) | RESPIRATORY_TRACT | 1 refills | Status: DC

## 2018-02-01 MED ORDER — GUAIFENESIN 100 MG/5ML PO SOLN: 5.0000 mL | ORAL | 0 refills | Status: DC | PRN

## 2018-02-01 MED ORDER — PREDNISONE 20 MG PO TABS: 40.0000 mg | ORAL_TABLET | Freq: Every day | ORAL | 0 refills | Status: DC

## 2018-02-01 NOTE — Care Management Important Message (Signed)
Important Message  Patient Details  Name: Stacey Durham MRN: [id number] Date of Birth: [birth year]   Medicare Important Message Given:  Yes    Olegario Messier A Aseel Truxillo 02/01/2018, 10:36 AM

## 2018-02-01 NOTE — Progress Notes (Signed)
[facility] - [place] at [facility]   PATIENT NAME: Stacey Durham    MR#:  [id number]  DATE OF BIRTH:  07-03-1945  SUBJECTIVE:  CHIEF COMPLAINT:   Chief Complaint  Patient presents with  . Shortness of Breath   Better shortness of breath and wheezing, hypoxia at 83% in room air, on O2 [place] 2 L, off high flow oxygen. REVIEW OF SYSTEMS:  Review of Systems  Constitutional: Negative for chills, fever and malaise/fatigue.  HENT: Negative for sore throat.   Eyes: Negative for blurred vision and double vision.  Respiratory: Positive for cough, shortness of breath and wheezing. Negative for hemoptysis, sputum production and stridor.   Cardiovascular: Negative for chest pain, palpitations, orthopnea and leg swelling.  Gastrointestinal: Negative for abdominal pain, blood in stool, diarrhea, melena, nausea and vomiting.  Genitourinary: Negative for dysuria, flank pain and hematuria.  Musculoskeletal: Negative for back pain and joint pain.  Skin: Negative for rash.  Neurological: Negative for dizziness, sensory change, focal weakness, seizures, loss of consciousness, weakness and headaches.  Endo/Heme/Allergies: Negative for polydipsia.  Psychiatric/Behavioral: Negative for depression. The patient is not nervous/anxious.     DRUG ALLERGIES:  No Known Allergies VITALS:  Blood pressure (!) 146/81, pulse 74, temperature 97.6 F (36.4 C), temperature source Oral, resp. rate 17, height 5\' 4"  (1.626 m), weight 66.1 kg, SpO2 95 %. PHYSICAL EXAMINATION:  Physical Exam  Constitutional: She is oriented to person, place, and time. She appears well-developed.  HENT:  Head: Normocephalic.  Mouth/Throat: Oropharynx is clear and moist.  Eyes: Pupils are equal, round, and reactive to light. Conjunctivae and EOM are normal. No scleral icterus.  Neck: Normal range of motion. Neck supple. No JVD present. No tracheal deviation present.  Cardiovascular: Normal rate, regular rhythm  and normal heart sounds. Exam reveals no gallop.  No murmur heard. Pulmonary/Chest: Effort normal. No stridor. No respiratory distress. She has wheezes. She has no rales.  Abdominal: Soft. Bowel sounds are normal. She exhibits no distension. There is no tenderness. There is no rebound.  Musculoskeletal: Normal range of motion. She exhibits no edema or tenderness.  Neurological: She is alert and oriented to person, place, and time. No cranial nerve deficit.  Skin: No rash noted. No erythema.  Psychiatric: She has a normal mood and affect.   LABORATORY PANEL:  Female CBC Recent Labs  Lab 01/30/18 0511  WBC 11.9*  HGB 13.6  HCT 38.9  PLT 241   ------------------------------------------------------------------------------------------------------------------ Chemistries  Recent Labs  Lab 01/29/18 0751 01/30/18 0511  NA 141 139  K 3.8 3.7  CL 105 107  CO2 27 22  GLUCOSE 130* 182*  BUN 22 25*  CREATININE 0.94 1.09*  CALCIUM 9.0 8.9  MG 2.2  --    RADIOLOGY:  No results found. ASSESSMENT AND PLAN:   Acute respiratory failure with hypoxia due to COPD exacerbation. Try to wean off O2 [place], but she may need home O2 . Taper IV solumedrol, DuoNeb prn, Dulera twice daily and Robitussin as needed. empiric doxycycline completed.  Dehydration.  Encourage fluid intake.  All the records are reviewed and case discussed with Care Management/Social Worker. Management plans discussed with the patient, family and they are in agreement.  CODE STATUS: Full Code  TOTAL TIME TAKING CARE OF THIS PATIENT: 25 minutes.   More than 50% of the time was spent in counseling/coordination of care: YES  POSSIBLE D/C IN 1-2 DAYS, DEPENDING ON CLINICAL CONDITION.   Shaune Pollack M.D on 02/01/2018 at  12:37 PM  Between 7am to 6pm - Pager - [phone]  After 6pm go to [email or web address] - [facility]rapist, [facility] Hospital[facility]

## 2018-02-01 NOTE — Progress Notes (Signed)
SATURATION QUALIFICATIONS: (This note is used to comply with regulatory documentation for home oxygen)  Patient Saturations on Room Air at Rest = 93%  Patient Saturations on Room Air while Ambulating = 87%  Patient Saturations on 2 Liters of oxygen while Ambulating = 94%  Please briefly explain why patient needs home oxygen: 

## 2018-02-02 MED ORDER — ALBUTEROL SULFATE (2.5 MG/3ML) 0.083% IN NEBU: 2.5000 mg | INHALATION_SOLUTION | RESPIRATORY_TRACT | 1 refills | Status: DC | PRN

## 2018-02-02 NOTE — Discharge Summary (Signed)
[facility] - [place] at [facility]   PATIENT NAME: Stacey Durham    MR#:  [id number]  DATE OF BIRTH:  Jun 07, 1945  DATE OF ADMISSION:  01/29/2018 ADMITTING PHYSICIAN: Shaune Pollack, MD  DATE OF DISCHARGE: No discharge date for patient encounter.  PRIMARY CARE PHYSICIAN: Eustaquio Boyden, MD    ADMISSION DIAGNOSIS:  Respiratory distress [R06.03] COPD exacerbation (HCC) [J44.1]  DISCHARGE DIAGNOSIS:  Active Problems:   Acute respiratory failure with hypoxia (HCC)   SECONDARY DIAGNOSIS:   Past Medical History:  Diagnosis Date  . Acute cholecystitis   . Cholecystitis with cholelithiasis 10/11/2015  . COPD (chronic obstructive pulmonary disease) (HCC)   . Ex-smoker    quit 2000s  . Pneumonia 11/26/2015    HOSPITAL COURSE:  Acute respiratory failure with hypoxia due to COPD exacerbation Resolved Patient unable to be weaned off oxygen-86% on room air with activity, 92% on 2 L via nasal cannula, treated with aggressive pulmonary toilet, IV Solu-Medrol, Dulera twice daily and Robitussin as needed, empiric doxycycline completed, and patient did well.  Dehydration Solved with IV fluids for rehydration  DISCHARGE CONDITIONS:   stable  CONSULTS OBTAINED:    DRUG ALLERGIES:  No Known Allergies  DISCHARGE MEDICATIONS:   Allergies as of 02/02/2018   No Known Allergies     Medication List    TAKE these medications   albuterol 108 (90 Base) MCG/ACT inhaler Commonly known as:  PROVENTIL HFA;VENTOLIN HFA Inhale 2 puffs into the lungs every 4 (four) hours as needed for wheezing or shortness of breath. What changed:  Another medication with the same name was changed. Make sure you understand how and when to take each.   albuterol (2.5 MG/3ML) 0.083% nebulizer solution Commonly known as:  PROVENTIL Take 3 mLs (2.5 mg total) by nebulization every 4 (four) hours as needed for wheezing or shortness of breath. What changed:  See the new instructions.   Biotin 40981 MCG Tabs Take 1 tablet by mouth.   calcium-vitamin D 500-200 MG-UNIT tablet Commonly known as:  OSCAL WITH D Take 1 tablet by mouth.   FLUTTER Devi 1 each by Does not apply route once.   guaiFENesin 100 MG/5ML Soln Commonly known as:  ROBITUSSIN Take 5 mLs (100 mg total) by mouth every 4 (four) hours as needed for cough or to loosen phlegm.   montelukast 10 MG tablet Commonly known as:  SINGULAIR TAKE 1 TABLET BY MOUTH EVERY DAY   MUCINEX DM PO Take 1 tablet by mouth daily.   MULTIVITAMIN ADULT PO Take 1 tablet by mouth.   predniSONE 20 MG tablet Commonly known as:  DELTASONE Take 2 tablets (40 mg total) by mouth daily.   PROBIOTIC-10 PO Take 1 tablet by mouth.   umeclidinium bromide 62.5 MCG/INH Aepb Commonly known as:  INCRUSE ELLIPTA Inhale 1 puff into the lungs daily.   vitamin B-12 1000 MCG tablet Commonly known as:  CYANOCOBALAMIN Take 1,000 mcg by mouth daily.            Durable Medical Equipment  (From admission, onward)         Start     Ordered   02/02/18 1124  DME Oxygen  Once    Comments:  86% on room air with activity, 92% on 2 L  Question Answer Comment  Mode or (Route) Nasal cannula   Liters per Minute 2   Oxygen conserving device Yes   Oxygen delivery system Gas      02/02/18 1123  DISCHARGE INSTRUCTIONS:  If you experience worsening of your admission symptoms, develop shortness of breath, life threatening emergency, suicidal or homicidal thoughts you must seek medical attention immediately by calling 911 or calling your MD immediately  if symptoms less severe.  You Must read complete instructions/literature along with all the possible adverse reactions/side effects for all the Medicines you take and that have been prescribed to you. Take any new Medicines after you have completely understood and accept all the possible adverse reactions/side effects.   Please note  You were cared for by a hospitalist  during your hospital stay. If you have any questions about your discharge medications or the care you received while you were in the hospital after you are discharged, you can call the unit and asked to speak with the hospitalist on call if the hospitalist that took care of you is not available. Once you are discharged, your primary care physician will handle any further medical issues. Please note that NO REFILLS for any discharge medications will be authorized once you are discharged, as it is imperative that you return to your primary care physician (or establish a relationship with a primary care physician if you do not have one) for your aftercare needs so that they can reassess your need for medications and monitor your lab values.    Today   CHIEF COMPLAINT:   Chief Complaint  Patient presents with  . Shortness of Breath    HISTORY OF PRESENT ILLNESS:  72 y.o. female with a known history of COPD, pneumonia, cholecystitis and cholelithiasis.  The patient presented to ED with above chief complaints.  She has had worsening shortness of breath, wheezing and a cough for 3 days.  He also complains some fever and chest tightness but no chills.  She is found hypoxia and put on high flow.  ED physician suggest put on BiPAP but the patient refused BIPAP.  VITAL SIGNS:  Blood pressure 134/78, pulse 88, temperature 97.6 F (36.4 C), temperature source Oral, resp. rate (!) 24, height 5\' 4"  (1.626 m), weight 66.1 kg, SpO2 93 %.  I/O:    Intake/Output Summary (Last 24 hours) at 02/02/2018 1125 Last data filed at 02/02/2018 1006 Gross per 24 hour  Intake 240 ml  Output 1700 ml  Net -1460 ml    PHYSICAL EXAMINATION:  GENERAL:  72 y.o.-year-old patient lying in the bed with no acute distress.  EYES: Pupils equal, round, reactive to light and accommodation. No scleral icterus. Extraocular muscles intact.  HEENT: Head atraumatic, normocephalic. Oropharynx and nasopharynx clear.  NECK:  Supple, no  jugular venous distention. No thyroid enlargement, no tenderness.  LUNGS: Normal breath sounds bilaterally, no wheezing, rales,rhonchi or crepitation. No use of accessory muscles of respiration.  CARDIOVASCULAR: S1, S2 normal. No murmurs, rubs, or gallops.  ABDOMEN: Soft, non-tender, non-distended. Bowel sounds present. No organomegaly or mass.  EXTREMITIES: No pedal edema, cyanosis, or clubbing.  NEUROLOGIC: Cranial nerves II through XII are intact. Muscle strength 5/5 in all extremities. Sensation intact. Gait not checked.  PSYCHIATRIC: The patient is alert and oriented x 3.  SKIN: No obvious rash, lesion, or ulcer.   DATA REVIEW:   CBC Recent Labs  Lab 01/30/18 0511  WBC 11.9*  HGB 13.6  HCT 38.9  PLT 241    Chemistries  Recent Labs  Lab 01/29/18 0751 01/30/18 0511  NA 141 139  K 3.8 3.7  CL 105 107  CO2 27 22  GLUCOSE 130* 182*  BUN 22 25*  CREATININE 0.94 1.09*  CALCIUM 9.0 8.9  MG 2.2  --     Cardiac Enzymes Recent Labs  Lab 01/29/18 0751  TROPONINI <0.03    Microbiology Results  Results for orders placed or performed during the hospital encounter of 01/29/18  MRSA PCR Screening     Status: None   Collection Time: 01/29/18 12:32 PM  Result Value Ref Range Status   MRSA by PCR NEGATIVE NEGATIVE Final    Comment:        The GeneXpert MRSA Assay (FDA approved for NASAL specimens only), is one component of a comprehensive MRSA colonization surveillance program. It is not intended to diagnose MRSA infection nor to guide or monitor treatment for MRSA infections. Performed at [facility], [address] [place], [place] [address]     RADIOLOGY:  No results found.  EKG:   Orders placed or performed during the hospital encounter of 01/29/18  . ED EKG  . ED EKG  . EKG 12-Lead  . EKG 12-Lead      Management plans discussed with the patient, family and they are in agreement.  CODE STATUS:     Code Status Orders  (From  admission, onward)         Start     Ordered   01/29/18 1241  Full code  Continuous     01/29/18 1240        Code Status History    Date Active Date Inactive Code Status Order ID Comments User Context   10/29/2016 1821 10/31/2016 1438 Full Code [id number]  Adrian Saran, MD Inpatient   10/11/2015 2157 10/12/2015 1444 Full Code [id number]  Leafy Ro, MD Inpatient   08/16/2015 1133 08/21/2015 1928 Full Code [id number]  Adrian Saran, MD Inpatient      TOTAL TIME TAKING CARE OF THIS PATIENT: 45 minutes.    Evelena Asa Salary M.D on 02/02/2018 at 11:25 AM  Between 7am to 6pm - Pager - [phone]  After 6pm go to [email or web address] - password [email or web address]  [facility]  Office  [phone]  CC: Primary care physician; Eustaquio Boyden, MD   Note: This dictation was prepared with Dragon dictation along with smaller phrase technology. Any transcriptional errors that result from this process are unintentional.

## 2018-02-02 NOTE — Care Management Note (Signed)
Case Management Note  Patient Details  Name: Stacey Durham MRN: [id number] Date of Birth: 23-Aug-1945  Subjective/Objective:   Admitted to [facility] with the diagnosiis of acute respiratory failure.      Discharge to home today per Dr. Katheren Shams             Action/Plan:                                                                                                    Referral for home oxygen.  Feliberto Gottron, [facility] representative updated   Expected Discharge Date:  02/02/18               Expected Discharge Plan:     In-House Referral:   yes  Discharge planning Services   yes  Post Acute Care Choice:   yes Choice offered to:   patient  DME Arranged:   yes DME Agency:   [facility]  HH Arranged:    HH Agency:     Status of Service:     If discussed at Long Length of Stay Meetings, dates discussed:    Additional Comments:  Gwenette Greet, RN  MSN CCM Care Management [phone] 02/02/2018, 12:39 PM

## 2018-02-02 NOTE — Evaluation (Signed)
Physical Therapy Evaluation Patient Details Name: Stacey Durham MRN: [id number] DOB: 01-Jan-1946 Today's Date: 02/02/2018   History of Present Illness  Pt is a 72 y.o. female with a known history of COPD, pneumonia, cholecystitis and cholelithiasis.  The patient presented to ED with complaints of SOB.  She has had worsening shortness of breath, wheezing and a cough for 3 days.  He also complained of fever and chest tightness but no chills.  Pt was found with hypoxia and put on high flow.  ED physician suggest put on BiPAP but the patient refused BIPAP.  Assessment includes acute respiratory failure with hypoxia due to COPD exacerbation and dehydration.      Clinical Impression  Pt presents with deficits in activity tolerance with SOB on exertion.  Pt was Ind with bed mobility and transfers and was able to amb 200 feet without an AD independently but presented with Mod SOB after amb.  Pt's SpO2 remained 90-91% on 2LO2/min during amb.  Pt's primary goal is improved endurance and ability to participate in activities with decreased fatigue.  Pt will benefit from participation in the [facility] program to address these goals and to decrease the pt's risk of further functional decline.  SW and nursing contacted regarding discharge recommendation.     Follow Up Recommendations Other (comment)([facility] program)    Furniture conservator/restorer  None recommended by PT    Recommendations for [employer]       Precautions / Restrictions Precautions Precautions: None Restrictions Weight Bearing Restrictions: No      Mobility  Bed Mobility Overal bed mobility: Independent                Transfers Overall transfer level: Independent               General transfer comment: Good control and stability  Ambulation/Gait Ambulation/Gait assistance: Independent Gait Distance (Feet): 200 Feet Assistive device: None Gait Pattern/deviations: WFL(Within Functional Limits)      General Gait Details: Good stability with SpO2 90-91% after amb on 2LO2/min, mod SOB  Stairs            Wheelchair Mobility    Modified Rankin (Stroke Patients Only)       Balance Overall balance assessment: No apparent balance deficits (not formally assessed)                                           Pertinent Vitals/Pain Pain Assessment: No/denies pain    Home Living Family/patient expects to be discharged to:: Private residence Living Arrangements: Non-relatives/Friends Available Help at Discharge: Friend(s);Available PRN/intermittently Type of Home: Mobile home Home Access: Stairs to enter Entrance Stairs-Rails: Right Entrance Stairs-Number of Steps: 3 Home Layout: One level Home Equipment: None      Prior Function Level of Independence: Independent         Comments: Ind amb without AD, Ind with ADLs, no fall history     Hand Dominance   Dominant Hand: Right    Extremity/Trunk Assessment   Upper Extremity Assessment Upper Extremity Assessment: Overall WFL for tasks assessed    Lower Extremity Assessment Lower Extremity Assessment: Overall WFL for tasks assessed       Communication   Communication: No difficulties  Cognition Arousal/Alertness: Awake/alert Behavior During Therapy: WFL for tasks assessed/performed Overall Cognitive Status: Within Functional Limits for tasks assessed  General Comments      Exercises     Assessment/Plan    PT Assessment Patient needs continued PT services  PT Problem List         PT Treatment Interventions Therapeutic exercise    PT Goals (Current goals can be found in the Care Plan section)  Acute Rehab PT Goals Patient Stated Goal: Improved endurance PT Goal Formulation: With patient Time For Goal Achievement: 02/15/18 Potential to Achieve Goals: Good    Frequency Min 2X/week   Barriers to discharge         Co-evaluation               AM-PAC PT "6 Clicks" Daily Activity  Outcome Measure Difficulty turning over in bed (including adjusting bedclothes, sheets and blankets)?: None Difficulty moving from lying on back to sitting on the side of the bed? : None Difficulty sitting down on and standing up from a chair with arms (e.g., wheelchair, bedside commode, etc,.)?: None Help needed moving to and from a bed to chair (including a wheelchair)?: None Help needed walking in hospital room?: None Help needed climbing 3-5 steps with a railing? : None 6 Click Score: 24    End of Session Equipment Utilized During Treatment: Gait belt;Oxygen Activity Tolerance: Patient tolerated treatment well Patient left: in bed;with call bell/phone within reach;with family/visitor present Nurse Communication: Mobility status;Other (comment)(Pt found on room air in room, unsure if pt should be on continuous O2) PT Visit Diagnosis: Difficulty in walking, not elsewhere classified (R26.2)    Time: 1320-1340 PT Time Calculation (min) (ACUTE ONLY): 20 min   Charges:   PT Evaluation $PT Eval Low Complexity: 1 Low          D. Scott Craigory Toste PT, DPT 02/02/18, 2:12 PM

## 2018-02-02 NOTE — Progress Notes (Signed)
SATURATION QUALIFICATIONS: (This note is used to comply with regulatory documentation for home oxygen)  Patient Saturations on Room Air at Rest = 93%  Patient Saturations on Room Air while Ambulating = 86%  Patient Saturations on 2 Liters of oxygen while Ambulating = 92%  Please briefly explain why patient needs home oxygen: 

## 2018-02-03 ENCOUNTER — Telehealth: Payer: Self-pay | Admitting: *Deleted

## 2018-02-03 NOTE — Telephone Encounter (Signed)
Transition Care Management Follow-up Telephone Call   Date discharged? 02/02/2018   How have you been since you were released from the hospital? "terrible"   Do you understand why you were in the hospital? yes   Do you understand the discharge instructions? yes   Where were you discharged to? home   Items Reviewed:  Medications reviewed: yes. Pt was sent home with O2 but was given an empty tank. Home health came out today with a replacement  Allergies reviewed: yes  Dietary changes reviewed: yes  Referrals reviewed: yes   Functional Questionnaire:   Activities of Daily Living (ADLs):   She states they are independent in the following: independent in all areas    Any transportation issues/concerns?: yes   Any patient concerns? Yes; Pt states she is wanting a sooner appt and is not wanting to wait until 9/11   Confirmed importance and date/time of follow-up visits scheduled yes  Provider Appointment booked with Dr Reece Agar 9/11; pt wanting sooner appt  Confirmed with patient if condition begins to worsen call PCP or go to the ER.  Patient was given the office number and encouraged to call back with question or concerns.  : yes

## 2018-02-03 NOTE — Telephone Encounter (Signed)
Spoke to pt who states she is "feeling terrible" and is not wanting to wait until 09/11 to be seen. She is requesting to be worked in before the weekend and is wanting a cb if this can occur. pls advise

## 2018-02-04 ENCOUNTER — Ambulatory Visit: Payer: Medicare HMO | Admitting: Family Medicine

## 2018-02-04 ENCOUNTER — Encounter: Payer: Self-pay | Admitting: Family Medicine

## 2018-02-04 ENCOUNTER — Ambulatory Visit (INDEPENDENT_AMBULATORY_CARE_PROVIDER_SITE_OTHER): Payer: Medicare HMO | Admitting: Family Medicine

## 2018-02-04 VITALS — BP 120/68 | HR 84 | Temp 97.8°F | Ht 64.0 in | Wt 144.2 lb

## 2018-02-04 DIAGNOSIS — J449 Chronic obstructive pulmonary disease, unspecified: Secondary | ICD-10-CM | POA: Diagnosis not present

## 2018-02-04 DIAGNOSIS — J441 Chronic obstructive pulmonary disease with (acute) exacerbation: Secondary | ICD-10-CM

## 2018-02-04 NOTE — Telephone Encounter (Signed)
Noted. Pt is on today's schedule.

## 2018-02-04 NOTE — Patient Instructions (Addendum)
I'm glad you're getting better. Daily controller medication will be incruse ellipta - take 1 puff every day to help prevent recurrent flares.  Finish prednisone course.  You passed oxygen test today. Keep oxygen for next several days at hand and use if you feel short winded or if oxygen levels drop, especially at night and with exertion.  Let us know if not improving as expected.

## 2018-02-04 NOTE — Assessment & Plan Note (Signed)
Requiring hospitalization - now improving. Today markedly improved from yesterday.  Finish prednisone course. She only received 3 days of doxy in hospital. No signs of ongoing bacterial infection.

## 2018-02-04 NOTE — Assessment & Plan Note (Addendum)
H/o severe COPD exacerbations requiring hospitalization.  Advised of importance of daily incruse ellipta to avoid recurrent exacerbations. Continue albuterol PRN.  Maintains oxygenation >90% with ambulation today.

## 2018-02-04 NOTE — Progress Notes (Signed)
BP 120/68 (BP Location: Left Arm, Patient Position: Sitting, Cuff Size: Normal)   Pulse 84   Temp 97.8 F (36.6 C) (Oral)   Ht 5\' 4"  (1.626 m)   Wt 144 lb 4 oz (65.4 kg)   SpO2 90% Comment: Ambulatory  BMI 24.76 kg/m    CC: hosp f/u visit Subjective:    Patient ID: Stacey Durham, female    DOB: 06-22-1945, 72 y.o.   MRN: [id number]  HPI: Stacey Durham is a 72 y.o. female presenting on 02/04/2018 for Hospitalization Follow-up (Pt is accompanied by her friend, Iantha Fallen. )   Recent hospitalization 8/31 for acute respi failure due to COPD exacerbation. Treated with IV solumedrol, dulera bid, robitussin, doxycycline and aggressive pulm toilet. Unable to be weaned off supplemental O2. Completed 3 days of doxycycline. Dehydration treated with IVF. Discharged on incruse ellipta 1 puff daily. Prior only on singulair.   Since home was not feeling well but today started feeling better.  Taking robitussin OTC prn.  Dry cough persists, dyspnea improving, wheezing improving.  No fevers.   DATE OF ADMISSION:  01/29/2018  DATE OF DISCHARGE: No discharge date for patient encounter (9/4). TCM f/u phone call completed 02/03/2018  D/C dx: Acute respiratory failure with hypoxia (HCC) COPD exacerbation  Relevant past medical, surgical, family and social history reviewed and updated as indicated. Interim medical history since our last visit reviewed. Allergies and medications reviewed and updated. D/C medications reconciled with current med list Outpatient Medications Prior to Visit  Medication Sig Dispense Refill  . albuterol (PROVENTIL HFA;VENTOLIN HFA) 108 (90 Base) MCG/ACT inhaler Inhale 2 puffs into the lungs every 4 (four) hours as needed for wheezing or shortness of breath. 1 Inhaler 6  . albuterol (PROVENTIL) (2.5 MG/3ML) 0.083% nebulizer solution Take 3 mLs (2.5 mg total) by nebulization every 4 (four) hours as needed for wheezing or shortness of breath. 300 mL 1  . Biotin 30940  MCG TABS Take 1 tablet by mouth.    . calcium-vitamin D (OSCAL WITH D) 500-200 MG-UNIT tablet Take 1 tablet by mouth.    . Dextromethorphan-Guaifenesin (MUCINEX DM PO) Take 1 tablet by mouth daily.     Marland Kitchen guaiFENesin (ROBITUSSIN) 100 MG/5ML SOLN Take 5 mLs (100 mg total) by mouth every 4 (four) hours as needed for cough or to loosen phlegm. 236 mL 0  . montelukast (SINGULAIR) 10 MG tablet TAKE 1 TABLET BY MOUTH EVERY DAY 90 tablet 2  . Multiple Vitamins-Minerals (MULTIVITAMIN ADULT PO) Take 1 tablet by mouth.    . predniSONE (DELTASONE) 20 MG tablet Take 2 tablets (40 mg total) by mouth daily. 8 tablet 0  . Probiotic Product (PROBIOTIC-10 PO) Take 1 tablet by mouth.    . Respiratory Therapy Supplies (FLUTTER) DEVI 1 each by Does not apply route once. 1 each 0  . umeclidinium bromide (INCRUSE ELLIPTA) 62.5 MCG/INH AEPB Inhale 1 puff into the lungs daily. 1 each 12  . vitamin B-12 (CYANOCOBALAMIN) 1000 MCG tablet Take 1,000 mcg by mouth daily.     No facility-administered medications prior to visit.      Per HPI unless specifically indicated in ROS section below Review of Systems     Objective:    BP 120/68 (BP Location: Left Arm, Patient Position: Sitting, Cuff Size: Normal)   Pulse 84   Temp 97.8 F (36.6 C) (Oral)   Ht 5\' 4"  (1.626 m)   Wt 144 lb 4 oz (65.4 kg)   SpO2 90% Comment: Ambulatory  BMI 24.76 kg/m   Wt Readings from Last 3 Encounters:  02/04/18 144 lb 4 oz (65.4 kg)  01/29/18 145 lb 11.6 oz (66.1 kg)  08/20/17 144 lb (65.3 kg)    Physical Exam  Constitutional: She appears well-developed and well-nourished. No distress.  HENT:  Mouth/Throat: Oropharynx is clear and moist. No oropharyngeal exudate.  Cardiovascular: Normal rate, regular rhythm and normal heart sounds.  No murmur heard. Pulmonary/Chest: No respiratory distress. She has decreased breath sounds. She has wheezes (faint exp). She has rhonchi (diffuse). She has no rales.  Skin: No erythema.  Psychiatric:  She has a normal mood and affect.  Nursing note and vitals reviewed.     Assessment & Plan:   Problem List Items Addressed This Visit    COPD exacerbation (HCC) - Primary    Requiring hospitalization - now improving. Today markedly improved from yesterday.  Finish prednisone course. She only received 3 days of doxy in hospital. No signs of ongoing bacterial infection.       Chronic obstructive pulmonary disease (HCC)    H/o severe COPD exacerbations requiring hospitalization.  Advised of importance of daily incruse ellipta to avoid recurrent exacerbations. Continue albuterol PRN.  Maintains oxygenation >90% with ambulation today.           No orders of the defined types were placed in this encounter.  No orders of the defined types were placed in this encounter.   Follow up plan: Return if symptoms worsen or fail to improve.  Eustaquio Boyden, MD

## 2018-02-04 NOTE — Telephone Encounter (Signed)
plz have her come in today at 3:30pm.

## 2018-02-07 ENCOUNTER — Ambulatory Visit: Payer: Medicare HMO | Admitting: Family Medicine

## 2018-02-07 DIAGNOSIS — R69 Illness, unspecified: Secondary | ICD-10-CM | POA: Diagnosis not present

## 2018-02-09 ENCOUNTER — Inpatient Hospital Stay: Payer: Medicare HMO | Admitting: Family Medicine

## 2018-02-18 LAB — BLOOD GAS, VENOUS
Acid-Base Excess: 2.1 mmol/L — ABNORMAL HIGH (ref 0.0–2.0)
Bicarbonate: 28.3 mmol/L — ABNORMAL HIGH (ref 20.0–28.0)
FIO2: 0.21
PATIENT TEMPERATURE: 37
pCO2, Ven: 49 mmHg (ref 44.0–60.0)
pH, Ven: 7.37 (ref 7.250–7.430)

## 2018-03-04 DIAGNOSIS — J9601 Acute respiratory failure with hypoxia: Secondary | ICD-10-CM | POA: Diagnosis not present

## 2018-03-04 DIAGNOSIS — J411 Mucopurulent chronic bronchitis: Secondary | ICD-10-CM | POA: Diagnosis not present

## 2018-03-04 DIAGNOSIS — J449 Chronic obstructive pulmonary disease, unspecified: Secondary | ICD-10-CM | POA: Diagnosis not present

## 2018-03-22 ENCOUNTER — Ambulatory Visit: Payer: Medicare HMO | Admitting: Internal Medicine

## 2018-03-22 ENCOUNTER — Encounter: Payer: Self-pay | Admitting: Internal Medicine

## 2018-03-22 ENCOUNTER — Other Ambulatory Visit: Payer: Self-pay | Admitting: Internal Medicine

## 2018-03-22 ENCOUNTER — Telehealth: Payer: Self-pay | Admitting: Internal Medicine

## 2018-03-22 VITALS — BP 120/70 | HR 70 | Ht 64.0 in | Wt 149.2 lb

## 2018-03-22 DIAGNOSIS — J441 Chronic obstructive pulmonary disease with (acute) exacerbation: Secondary | ICD-10-CM

## 2018-03-22 MED ORDER — PREDNISONE 20 MG PO TABS: 40.0000 mg | ORAL_TABLET | Freq: Every day | ORAL | 0 refills | Status: DC

## 2018-03-22 MED ORDER — TIOTROPIUM BROMIDE MONOHYDRATE 1.25 MCG/ACT IN AERS: 2.0000 | INHALATION_SPRAY | RESPIRATORY_TRACT | 6 refills | Status: DC

## 2018-03-22 MED ORDER — GUAIFENESIN-CODEINE 100-10 MG/5ML PO SOLN: 5.0000 mL | ORAL | 0 refills | Status: DC | PRN

## 2018-03-22 MED ORDER — UMECLIDINIUM-VILANTEROL 62.5-25 MCG/INH IN AEPB: 1.0000 | INHALATION_SPRAY | Freq: Every day | RESPIRATORY_TRACT | 5 refills | Status: DC

## 2018-03-22 NOTE — Telephone Encounter (Signed)
Pt c/o medication issue:  1. Name of Medication: Anoro Ellipta   2. How are you currently taking this medication (dosage and times per day)? 62.5-25 MCG  3. Are you having a reaction (difficulty breathing--STAT)?   4. What is your medication issue? Tobi Bastos from [facility] calling - needs directions specified for medication.  Please call to discuss at [phone].

## 2018-03-22 NOTE — Progress Notes (Signed)
[facility] Pulmonary Medicine Consultation     Date: 03/22/2018,   MRN# [id number] Stacey Durham [birth year]   CHIEF COMPLAINT:  Follow-up COPD Active wheezing   HISTORY OF PRESENT ILLNESS   Patient with a recent hospital admission in end of August for 4 days Admitted for COPD exacerbation and wheezing  Patient has chronic shortness of breath and dyspnea on exertion Previous office visit several months ago also had COPD exacerbation  Patient still wheezing at this time No signs of acute respiratory distress No signs of infection 3.  I got Patient states that Incruse is not helping at this time I will need to add additional inhaler to her therapy and stop Incruse  Patient was discharged on oxygen therapy She will need 6-minute walk test and ONO for assessment   Current Medication:   Current Outpatient Medications:  .  albuterol (PROVENTIL HFA;VENTOLIN HFA) 108 (90 Base) MCG/ACT inhaler, Inhale 2 puffs into the lungs every 4 (four) hours as needed for wheezing or shortness of breath., Disp: 1 Inhaler, Rfl: 6 .  albuterol (PROVENTIL) (2.5 MG/3ML) 0.083% nebulizer solution, Take 3 mLs (2.5 mg total) by nebulization every 4 (four) hours as needed for wheezing or shortness of breath., Disp: 300 mL, Rfl: 1 .  Biotin 40981 MCG TABS, Take 1 tablet by mouth., Disp: , Rfl:  .  calcium-vitamin D (OSCAL WITH D) 500-200 MG-UNIT tablet, Take 1 tablet by mouth., Disp: , Rfl:  .  Dextromethorphan-Guaifenesin (MUCINEX DM PO), Take 1 tablet by mouth daily. , Disp: , Rfl:  .  guaiFENesin (ROBITUSSIN) 100 MG/5ML SOLN, Take 5 mLs (100 mg total) by mouth every 4 (four) hours as needed for cough or to loosen phlegm., Disp: 236 mL, Rfl: 0 .  montelukast (SINGULAIR) 10 MG tablet, TAKE 1 TABLET BY MOUTH EVERY DAY, Disp: 90 tablet, Rfl: 2 .  Multiple Vitamins-Minerals (MULTIVITAMIN ADULT PO), Take 1 tablet by mouth., Disp: , Rfl:  .  predniSONE (DELTASONE) 20 MG tablet, Take 2 tablets (40 mg  total) by mouth daily., Disp: 8 tablet, Rfl: 0 .  Probiotic Product (PROBIOTIC-10 PO), Take 1 tablet by mouth., Disp: , Rfl:  .  Respiratory Therapy Supplies (FLUTTER) DEVI, 1 each by Does not apply route once., Disp: 1 each, Rfl: 0 .  umeclidinium bromide (INCRUSE ELLIPTA) 62.5 MCG/INH AEPB, Inhale 1 puff into the lungs daily., Disp: 1 each, Rfl: 12 .  vitamin B-12 (CYANOCOBALAMIN) 1000 MCG tablet, Take 1,000 mcg by mouth daily., Disp: , Rfl:      ALLERGIES   Patient has no known allergies.     REVIEW OF SYSTEMS   Review of Systems  Constitutional: Negative for chills, fever, malaise/fatigue and weight loss.  HENT: Negative for congestion.   Respiratory: Positive for cough, shortness of breath and wheezing. Negative for hemoptysis and sputum production.        Chronic SOB/DOE at baseline  Cardiovascular: Negative for chest pain, palpitations, orthopnea and leg swelling.  All other systems reviewed and are negative.   BP 120/70 (BP Location: Left Arm, Cuff Size: Normal)   Pulse 70   Ht 5\' 4"  (1.626 m)   Wt 149 lb 3.2 oz (67.7 kg)   SpO2 95%   BMI 25.61 kg/m    PHYSICAL EXAM   Physical Exam  Constitutional: She is oriented to person, place, and time. She appears well-developed and well-nourished.  HENT:  Mouth/Throat: No oropharyngeal exudate.  Eyes: EOM are normal. No scleral icterus.  Cardiovascular: Normal rate, regular rhythm  and normal heart sounds.  No murmur heard. Pulmonary/Chest: No stridor. No respiratory distress. She has wheezes. She has no rales.  Musculoskeletal: Normal range of motion. She exhibits no edema.  Neurological: She is alert and oriented to person, place, and time.  Skin: Skin is warm.  Psychiatric: She has a normal mood and affect.   CT chest Lung cancer Screening: 08/2015 No nodules/massess PFT 08/2015 Ratio 52% fev1 1.1 46%  fvc 2.1 67%  WNL ONO WNL     ASSESSMENT/PLAN    72 year old white female with severe COPD based  on pulmonary function testing however with gold stage a symptoms with history of extensive smoking history  Patient now with COPD exacerbation with constant wheezing  She will need prednisone therapy 40 mg daily for 7 days I will stop Incruse and start Respimat Continue albuterol as needed Patient was discharged home on oxygen therapy she will need reassessment of 6-minute walk and ONO to assess whether or not she needs oxygen therapy I will need to reassess her PFTs at next office visit  Deconditioned state -Recommend increased daily activity and exercise   Patient/Family are satisfied with Plan of action and management. All questions answered  Lucie Leather, M.D.  [name]  Medical Director [facility] Medical Director [facility] Cardio-Pulmonary Department

## 2018-03-22 NOTE — Telephone Encounter (Signed)
rx resent with directions. Nothing further needed.

## 2018-03-22 NOTE — Patient Instructions (Addendum)
Prednisone 40 mg daily for 7 days Robitussin with Codeine  Start Spiriva Respimat 1.25   stop INCRUSE  Check 6 MWT  Check ONO

## 2018-04-04 DIAGNOSIS — J449 Chronic obstructive pulmonary disease, unspecified: Secondary | ICD-10-CM | POA: Diagnosis not present

## 2018-04-04 DIAGNOSIS — J9601 Acute respiratory failure with hypoxia: Secondary | ICD-10-CM | POA: Diagnosis not present

## 2018-04-04 DIAGNOSIS — J411 Mucopurulent chronic bronchitis: Secondary | ICD-10-CM | POA: Diagnosis not present

## 2018-04-11 ENCOUNTER — Ambulatory Visit (INDEPENDENT_AMBULATORY_CARE_PROVIDER_SITE_OTHER): Payer: Medicare HMO | Admitting: *Deleted

## 2018-04-11 DIAGNOSIS — J449 Chronic obstructive pulmonary disease, unspecified: Secondary | ICD-10-CM

## 2018-04-11 NOTE — Progress Notes (Signed)
SIX MIN WALK 04/11/2018 10/04/2015  Medications - Oscal with D, Singulair, Probiotic, Spiriva  Supplimental Oxygen during Test? (L/min) No No  Laps 7 7  Partial Lap (in Meters) 24 21  Baseline BP (sitting) 110/70 124/74  Baseline Heartrate 77 73  Baseline Dyspnea (Borg Scale) 0 0  Baseline Fatigue (Borg Scale) 0 0  Baseline SPO2 94 94  BP (sitting) 120/70 136/80  Heartrate 100 88  Dyspnea (Borg Scale) 2 0  Fatigue (Borg Scale) 1 0  SPO2 96 95  BP (sitting) 108/64 118/76  Heartrate 83 79  SPO2 96 95  Stopped or Paused before Six Minutes No No  Distance Completed 360 357  Tech Comments: Pt walked a brisk walking pace. She did not De-Sat. She did not c/o any sx but was actively wheezing at the end of walk. Pt walked at moderate pace.     SMW performed today

## 2018-04-18 DIAGNOSIS — R0902 Hypoxemia: Secondary | ICD-10-CM | POA: Diagnosis not present

## 2018-04-18 DIAGNOSIS — J449 Chronic obstructive pulmonary disease, unspecified: Secondary | ICD-10-CM | POA: Diagnosis not present

## 2018-04-19 ENCOUNTER — Telehealth: Payer: Self-pay | Admitting: *Deleted

## 2018-04-19 NOTE — Telephone Encounter (Signed)
Pt aware of results Pt says she has 02 currently in the home. She was advised to continue 2 liters at bedtime. Nothing further needed.

## 2018-05-02 ENCOUNTER — Ambulatory Visit (INDEPENDENT_AMBULATORY_CARE_PROVIDER_SITE_OTHER): Payer: Medicare HMO | Admitting: Internal Medicine

## 2018-05-02 ENCOUNTER — Encounter: Payer: Self-pay | Admitting: Internal Medicine

## 2018-05-02 VITALS — BP 124/72 | HR 76 | Temp 97.7°F | Wt 148.0 lb

## 2018-05-02 DIAGNOSIS — J441 Chronic obstructive pulmonary disease with (acute) exacerbation: Secondary | ICD-10-CM

## 2018-05-02 DIAGNOSIS — R69 Illness, unspecified: Secondary | ICD-10-CM | POA: Diagnosis not present

## 2018-05-02 MED ORDER — IPRATROPIUM-ALBUTEROL 0.5-2.5 (3) MG/3ML IN SOLN
3.0000 mL | Freq: Once | RESPIRATORY_TRACT | Status: AC
  Administered 2018-05-02: 3 mL via RESPIRATORY_TRACT

## 2018-05-02 MED ORDER — IPRATROPIUM-ALBUTEROL 0.5-2.5 (3) MG/3ML IN SOLN: 3.0000 mL | Freq: Four times a day (QID) | RESPIRATORY_TRACT | 0 refills | Status: DC | PRN

## 2018-05-02 MED ORDER — PREDNISONE 10 MG PO TABS: ORAL_TABLET | ORAL | 0 refills | Status: DC

## 2018-05-02 NOTE — Patient Instructions (Signed)

## 2018-05-02 NOTE — Addendum Note (Signed)
Addended by: Roena MaladyEVONTENNO, Yasser Hepp Y on: 05/02/2018 04:36 PM   Modules accepted: Orders

## 2018-05-02 NOTE — Progress Notes (Addendum)
HPI  Pt presents to the clinic today with c/o cough, wheezing and shortness of breath. She reports this started 1 week ago. The cough is productive of white mucous. She denies runny nose, nasal congestion, ear pain or sore throat. She denies fever, chills or body aches. She has tried Singulair, Anoro Ellipta, Mucinex and Tylenol Cold and Flu with minimal relief. She has a history of COPD, last exacerbation 01/2018, treated with Doxycycline and Prednisone. She has a history of chronic respiratory failure, on O2 at night.  Review of Systems      Past Medical History:  Diagnosis Date  . Acute cholecystitis   . Cholecystitis with cholelithiasis 10/11/2015  . COPD (chronic obstructive pulmonary disease) (HCC)   . Ex-smoker    quit 2000s  . Pneumonia 11/26/2015    Family History  Problem Relation Age of Onset  . Heart attack Father [id number]  . Diabetes Father   . Diabetes Mother   . Stroke Neg Hx   . Cancer Neg Hx     Social History   Socioeconomic History  . Marital status: Widowed    Spouse name: Not on file  . Number of children: Not on file  . Years of education: Not on file  . Highest education level: Not on file  Occupational History  . Not on file  Social Needs  . Financial resource strain: Not on file  . Food insecurity:    Worry: Not on file    Inability: Not on file  . Transportation needs:    Medical: Not on file    Non-medical: Not on file  Tobacco Use  . Smoking status: Former Smoker    Packs/day: 1.00    Years: 40.00    Pack years: 40.00    Last attempt to quit: 06/01/2001    Years since quitting: 16.9  . Smokeless tobacco: Never Used  Substance and Sexual Activity  . Alcohol use: No  . Drug use: No  . Sexual activity: Not on file  Lifestyle  . Physical activity:    Days per week: Not on file    Minutes per session: Not on file  . Stress: Not on file  Relationships  . Social connections:    Talks on phone: Not on file    Gets together: Not on file   Attends religious service: Not on file    Active member of club or organization: Not on file    Attends meetings of clubs or organizations: Not on file    Relationship status: Not on file  . Intimate partner violence:    Fear of current or ex partner: Not on file    Emotionally abused: Not on file    Physically abused: Not on file    Forced sexual activity: Not on file  Other Topics Concern  . Not on file  Social History Narrative   [place]   Widower - husband passed away 2012   Lives alone, 1 chihuahua   GF of Page SpiroKen Isley   Occ: retired, worked at [employer]   Activity: [place] [employer]   Diet: good water, fruits/vegetables daily    No Known Allergies   Constitutional: Positive headache, fatigue and fever. Denies abrupt weight changes.  HEENT:  Denies eye redness, eye pain, pressure behind the eyes, facial pain, nasal congestion, ear pain, ringing in the ears, wax buildup, runny nose or sore throat. Respiratory: Positive cough, wheezing and shortness of breath.  Cardiovascular: Denies chest pain, chest  tightness, palpitations or swelling in the hands or feet.   No other specific complaints in a complete review of systems (except as listed in HPI above).  Objective:   BP 124/72   Pulse 76   Temp 97.7 F (36.5 C) (Oral)   Wt 148 lb (67.1 kg)   SpO2 96%   BMI 25.40 kg/m   Wt Readings from Last 3 Encounters:  05/02/18 148 lb (67.1 kg)  03/22/18 149 lb 3.2 oz (67.7 kg)  02/04/18 144 lb 4 oz (65.4 kg)     General: Appears her stated age, well developed, well nourished in NAD. HEENT: Head: normal shape and size, no sinus tenderness noted; Nose: mucosa pink and moist, septum midline; Throat/Mouth: Teeth present, mucosa pink and moist, no exudate noted, no lesions or ulcerations noted.  Neck: No cervical lymphadenopathy.  Cardiovascular: Unable to hear due to wheezing Pulmonary/Chest: Increased effort with bilateral inspiratory and expiratory  wheezing noted. No stridor. No rhonchi or rales noted. No respiratory distress.      Assessment & Plan:   COPD Exacerbation:  Get some rest and drink plenty of water Duoneb given in office, RX provided for this to use at home 80 mg Depo IM today Continue Anoro Elliptal, Albuterol, Singulair eRx for Pred Taper x 6 days No indication for abx at this time Continue oxygen as prescribed  RTC as needed or if symptoms persist.   Stacey Reaper, NP

## 2018-05-02 NOTE — Addendum Note (Signed)
Addended by: Lorre MunroeBAITY, REGINA W on: 05/02/2018 11:56 AM   Modules accepted: Orders

## 2018-05-04 DIAGNOSIS — J411 Mucopurulent chronic bronchitis: Secondary | ICD-10-CM | POA: Diagnosis not present

## 2018-05-04 DIAGNOSIS — J449 Chronic obstructive pulmonary disease, unspecified: Secondary | ICD-10-CM | POA: Diagnosis not present

## 2018-05-10 ENCOUNTER — Encounter: Payer: Self-pay | Admitting: Family Medicine

## 2018-05-10 ENCOUNTER — Ambulatory Visit (INDEPENDENT_AMBULATORY_CARE_PROVIDER_SITE_OTHER): Payer: Medicare HMO | Admitting: Family Medicine

## 2018-05-10 VITALS — BP 132/70 | HR 81 | Temp 97.8°F | Ht 64.0 in | Wt 147.0 lb

## 2018-05-10 DIAGNOSIS — J9611 Chronic respiratory failure with hypoxia: Secondary | ICD-10-CM | POA: Diagnosis not present

## 2018-05-10 DIAGNOSIS — R197 Diarrhea, unspecified: Secondary | ICD-10-CM | POA: Diagnosis not present

## 2018-05-10 DIAGNOSIS — J441 Chronic obstructive pulmonary disease with (acute) exacerbation: Secondary | ICD-10-CM

## 2018-05-10 DIAGNOSIS — K529 Noninfective gastroenteritis and colitis, unspecified: Secondary | ICD-10-CM | POA: Diagnosis not present

## 2018-05-10 DIAGNOSIS — Z87891 Personal history of nicotine dependence: Secondary | ICD-10-CM

## 2018-05-10 MED ORDER — PREDNISONE 20 MG PO TABS: ORAL_TABLET | ORAL | 0 refills | Status: DC

## 2018-05-10 MED ORDER — IPRATROPIUM-ALBUTEROL 0.5-2.5 (3) MG/3ML IN SOLN
3.0000 mL | Freq: Once | RESPIRATORY_TRACT | Status: AC
  Administered 2018-05-10: 3 mL via RESPIRATORY_TRACT

## 2018-05-10 MED ORDER — LEVOFLOXACIN 500 MG PO TABS: 500.0000 mg | ORAL_TABLET | Freq: Every day | ORAL | 0 refills | Status: DC

## 2018-05-10 NOTE — Assessment & Plan Note (Signed)
Patient has complicated COPD - severe requiring prior hospitalizations, s/p multiple courses of antibiotics in the past year, [age] yo, possible pseudomonas risk - will treat with levaquin course and another prednisone taper. Update if not improving with treatment. Red flags to seek ER care reviewed. Pt and partner agree with plan.

## 2018-05-10 NOTE — Assessment & Plan Note (Addendum)
H/o severe COPD with hospitalizations in the past. Sees pulm. Now on supplemental oxygen at night time.

## 2018-05-10 NOTE — Progress Notes (Signed)
BP 132/70 (BP Location: Left Arm, Patient Position: Sitting, Cuff Size: Normal)   Pulse 81   Temp 97.8 F (36.6 C) (Oral)   Ht 5\' 4"  (1.626 m)   Wt 147 lb (66.7 kg)   SpO2 96%   BMI 25.23 kg/m    CC: COPD exacerbation, ongoing Subjective:    Patient ID: Stacey Durham, female    DOB: 06-25-1945, 72 y.o.   MRN: [id number]  HPI: Stacey Durham is a 72 y.o. female presenting on 05/10/2018 for COPD (C/o COPD exacerbation.  Started about 3 wks ago. Was seen last week. Pt accompanied by her friend, Rocky LinkKen.)   Seen last week 05/02/2018 by Rene Kocheregina with dx COPD exacerbation, treated with duoneb in office and depo IM 80mg  then prednisone taper. Rec continued anoro ellipta, albuterol, singulair.   Prednisone course was not effective. Ongoing non productive cough, dyspnea. Appetite ok but ongoing diarrhea with anything she eats.   No fevers, body aches.  No sick contacts at home.  Quit smoking >15 yrs ago  COLONOSCOPY 07/2016 - HP, ext hem, diverticulosis, rpt 5 yrs (Outlaw)  Ongoing diarrhea for over a year. At times black tarry. Some trouble controlling bowels. Has not seen GI for this.   Relevant past medical, surgical, family and social history reviewed and updated as indicated. Interim medical history since our last visit reviewed. Allergies and medications reviewed and updated. Outpatient Medications Prior to Visit  Medication Sig Dispense Refill  . albuterol (PROVENTIL HFA;VENTOLIN HFA) 108 (90 Base) MCG/ACT inhaler Inhale 2 puffs into the lungs every 4 (four) hours as needed for wheezing or shortness of breath. 1 Inhaler 6  . albuterol (PROVENTIL) (2.5 MG/3ML) 0.083% nebulizer solution Take 3 mLs (2.5 mg total) by nebulization every 4 (four) hours as needed for wheezing or shortness of breath. 300 mL 1  . Biotin 4098110000 MCG TABS Take 1 tablet by mouth.    . calcium-vitamin D (OSCAL WITH D) 500-200 MG-UNIT tablet Take 1 tablet by mouth.    . Dextromethorphan-Guaifenesin  (MUCINEX DM PO) Take 1 tablet by mouth daily.     Marland Kitchen. ipratropium-albuterol (DUONEB) 0.5-2.5 (3) MG/3ML SOLN Take 3 mLs by nebulization every 6 (six) hours as needed. 360 mL 0  . montelukast (SINGULAIR) 10 MG tablet TAKE 1 TABLET BY MOUTH EVERY DAY 90 tablet 2  . Multiple Vitamins-Minerals (MULTIVITAMIN ADULT PO) Take 1 tablet by mouth.    . Probiotic Product (PROBIOTIC-10 PO) Take 1 tablet by mouth.    . Respiratory Therapy Supplies (FLUTTER) DEVI 1 each by Does not apply route once. 1 each 0  . umeclidinium-vilanterol (ANORO ELLIPTA) 62.5-25 MCG/INH AEPB Inhale 1 puff into the lungs daily. 1 each 5  . vitamin B-12 (CYANOCOBALAMIN) 1000 MCG tablet Take 1,000 mcg by mouth daily.    . predniSONE (DELTASONE) 10 MG tablet Take 6 tabs day 1, 5 tabs day 2, 4 tabs day 3, 3 tabs day 4, 2 tabs day 5, 1 tab day 6 21 tablet 0   No facility-administered medications prior to visit.      Per HPI unless specifically indicated in ROS section below Review of Systems     Objective:    BP 132/70 (BP Location: Left Arm, Patient Position: Sitting, Cuff Size: Normal)   Pulse 81   Temp 97.8 F (36.6 C) (Oral)   Ht 5\' 4"  (1.626 m)   Wt 147 lb (66.7 kg)   SpO2 96%   BMI 25.23 kg/m   Wt Readings from Last  3 Encounters:  05/10/18 147 lb (66.7 kg)  05/02/18 148 lb (67.1 kg)  03/22/18 149 lb 3.2 oz (67.7 kg)    Physical Exam  Constitutional: She appears well-developed and well-nourished. No distress.  HENT:  Mouth/Throat: Oropharynx is clear and moist. No oropharyngeal exudate.  Cardiovascular: Normal rate, regular rhythm and normal heart sounds.  No murmur heard. Pulmonary/Chest: Tachypnea noted. She has decreased breath sounds. She has wheezes (diffuse coarse throughout). She has no rhonchi. She has no rales.  Increased work of breathing noted  Musculoskeletal: She exhibits no edema.  Nursing note and vitals reviewed.  After 2 albuterol/atrovent nebulization treatments, improved air flow with  decreased work of breathing. Persistent wheezing but less coarse and less diffuse. Able to speak in complete sentences.   Lab Results  Component Value Date   CREATININE 1.09 (H) 01/30/2018   BUN 25 (H) 01/30/2018   NA 139 01/30/2018   K 3.7 01/30/2018   CL 107 01/30/2018   CO2 22 01/30/2018    Lab Results  Component Value Date   WBC 11.9 (H) 01/30/2018   HGB 13.6 01/30/2018   HCT 38.9 01/30/2018   MCV 90.3 01/30/2018   PLT 241 01/30/2018       Assessment & Plan:   Problem List Items Addressed This Visit    Ex-smoker   COPD exacerbation (HCC) - Primary    Patient has complicated COPD - severe requiring prior hospitalizations, s/p multiple courses of antibiotics in the past year, >62 yo, possible pseudomonas risk - will treat with levaquin course and another prednisone taper. Update if not improving with treatment. Red flags to seek ER care reviewed. Pt and partner agree with plan.       Relevant Medications   ipratropium-albuterol (DUONEB) 0.5-2.5 (3) MG/3ML nebulizer solution 3 mL (Completed)   predniSONE (DELTASONE) 20 MG tablet   Chronic respiratory failure with hypoxia (HCC)   Chronic obstructive pulmonary disease (HCC)    H/o severe COPD with hospitalizations in the past. Sees pulm. Now on supplemental oxygen at night time.       Relevant Medications   ipratropium-albuterol (DUONEB) 0.5-2.5 (3) MG/3ML nebulizer solution 3 mL (Completed)   predniSONE (DELTASONE) 20 MG tablet   Chronic diarrhea    Present for last almost 2 yrs (since cholecystectomy). Pt endorses episodes of fecal urgency and rare incontinence, as well as episodes of black tarry stool. May be post-cholecystectomy diarrhea, however I did send home today with iFOB and GI pathogen panel today to further evaluate chronic diarrhea.           Meds ordered this encounter  Medications  . ipratropium-albuterol (DUONEB) 0.5-2.5 (3) MG/3ML nebulizer solution 3 mL  . predniSONE (DELTASONE) 20 MG tablet    Sig:  Take two tablets daily for 3 days followed by one tablet daily for 4 days    Dispense:  10 tablet    Refill:  0  . levofloxacin (LEVAQUIN) 500 MG tablet    Sig: Take 1 tablet (500 mg total) by mouth daily.    Dispense:  7 tablet    Refill:  0   Orders Placed This Encounter  Procedures  . Fecal occult blood, imunochemical    Standing Status:   Future    Standing Expiration Date:   05/11/2019  . Gastrointestinal Pathogen Panel PCR    Standing Status:   Future    Standing Expiration Date:   05/11/2019    Follow up plan: Return if symptoms worsen or fail to improve.  Ria Bush, MD

## 2018-05-10 NOTE — Patient Instructions (Addendum)
You have persistent COPD exacerbation.  2 breathing treatments in office.  Start prednisone taper as well as levaquin antibiotic sent to pharmacy.  If worsening shortness of breath or trouble breathing, go to ER right away.  For diarrhea, pass by lab to pick up 2 stool kits one to test for blood and one for infection.

## 2018-05-10 NOTE — Assessment & Plan Note (Signed)
Present for last almost 2 yrs (since cholecystectomy). Pt endorses episodes of fecal urgency and rare incontinence, as well as episodes of black tarry stool. May be post-cholecystectomy diarrhea, however I did send home today with iFOB and GI pathogen panel today to further evaluate chronic diarrhea.

## 2018-05-11 ENCOUNTER — Other Ambulatory Visit (INDEPENDENT_AMBULATORY_CARE_PROVIDER_SITE_OTHER): Payer: Medicare HMO

## 2018-05-11 DIAGNOSIS — R197 Diarrhea, unspecified: Secondary | ICD-10-CM | POA: Diagnosis not present

## 2018-05-18 LAB — GASTROINTESTINAL PATHOGEN PANEL PCR
C. DIFFICILE TOX A/B, PCR: UNDETERMINED — AB
CAMPYLOBACTER, PCR: UNDETERMINED — AB
Cryptosporidium, PCR: UNDETERMINED — AB
E COLI (ETEC) LT/ST, PCR: UNDETERMINED — AB
E COLI 0157, PCR: UNDETERMINED — AB
E coli (STEC) stx1/stx2, PCR: UNDETERMINED — AB
Giardia lamblia, PCR: UNDETERMINED — AB
Norovirus, PCR: UNDETERMINED — AB
Rotavirus A, PCR: UNDETERMINED — AB
SALMONELLA, PCR: UNDETERMINED — AB
SHIGELLA, PCR: UNDETERMINED — AB

## 2018-05-19 ENCOUNTER — Other Ambulatory Visit: Payer: Self-pay | Admitting: Family Medicine

## 2018-05-19 DIAGNOSIS — K529 Noninfective gastroenteritis and colitis, unspecified: Secondary | ICD-10-CM

## 2018-05-20 ENCOUNTER — Other Ambulatory Visit (INDEPENDENT_AMBULATORY_CARE_PROVIDER_SITE_OTHER): Payer: Medicare HMO

## 2018-05-20 DIAGNOSIS — K529 Noninfective gastroenteritis and colitis, unspecified: Secondary | ICD-10-CM

## 2018-05-20 DIAGNOSIS — R197 Diarrhea, unspecified: Secondary | ICD-10-CM | POA: Diagnosis not present

## 2018-05-20 DIAGNOSIS — R159 Full incontinence of feces: Secondary | ICD-10-CM | POA: Diagnosis not present

## 2018-05-24 ENCOUNTER — Other Ambulatory Visit: Payer: Self-pay | Admitting: Internal Medicine

## 2018-05-26 ENCOUNTER — Other Ambulatory Visit (INDEPENDENT_AMBULATORY_CARE_PROVIDER_SITE_OTHER): Payer: Medicare HMO

## 2018-05-26 DIAGNOSIS — R197 Diarrhea, unspecified: Secondary | ICD-10-CM | POA: Diagnosis not present

## 2018-05-26 LAB — FECAL OCCULT BLOOD, IMMUNOCHEMICAL: Fecal Occult Bld: NEGATIVE

## 2018-05-27 LAB — GASTROINTESTINAL PATHOGEN PANEL PCR
C. difficile Tox A/B, PCR: UNDETERMINED — AB
Campylobacter, PCR: UNDETERMINED — AB
Cryptosporidium, PCR: UNDETERMINED — AB
E COLI (STEC) STX1/STX2, PCR: UNDETERMINED — AB
E coli (ETEC) LT/ST PCR: UNDETERMINED — AB
E coli 0157, PCR: UNDETERMINED — AB
Giardia lamblia, PCR: UNDETERMINED — AB
Norovirus, PCR: UNDETERMINED — AB
ROTAVIRUS, PCR: UNDETERMINED — AB
SALMONELLA, PCR: UNDETERMINED — AB
SHIGELLA, PCR: UNDETERMINED — AB

## 2018-05-30 ENCOUNTER — Other Ambulatory Visit: Payer: Self-pay | Admitting: Family Medicine

## 2018-05-30 DIAGNOSIS — K529 Noninfective gastroenteritis and colitis, unspecified: Secondary | ICD-10-CM

## 2018-06-07 ENCOUNTER — Other Ambulatory Visit: Payer: Self-pay | Admitting: Family Medicine

## 2018-06-07 DIAGNOSIS — Z1231 Encounter for screening mammogram for malignant neoplasm of breast: Secondary | ICD-10-CM

## 2018-06-29 ENCOUNTER — Encounter: Payer: Self-pay | Admitting: Family Medicine

## 2018-06-29 ENCOUNTER — Ambulatory Visit (INDEPENDENT_AMBULATORY_CARE_PROVIDER_SITE_OTHER): Payer: Medicare HMO | Admitting: Family Medicine

## 2018-06-29 ENCOUNTER — Telehealth: Payer: Self-pay | Admitting: Family Medicine

## 2018-06-29 VITALS — BP 126/68 | HR 84 | Temp 97.6°F | Ht 64.0 in | Wt 148.5 lb

## 2018-06-29 DIAGNOSIS — J9611 Chronic respiratory failure with hypoxia: Secondary | ICD-10-CM | POA: Diagnosis not present

## 2018-06-29 DIAGNOSIS — J439 Emphysema, unspecified: Secondary | ICD-10-CM

## 2018-06-29 DIAGNOSIS — R69 Illness, unspecified: Secondary | ICD-10-CM | POA: Diagnosis not present

## 2018-06-29 DIAGNOSIS — J411 Mucopurulent chronic bronchitis: Secondary | ICD-10-CM | POA: Diagnosis not present

## 2018-06-29 DIAGNOSIS — J449 Chronic obstructive pulmonary disease, unspecified: Secondary | ICD-10-CM | POA: Diagnosis not present

## 2018-06-29 DIAGNOSIS — R0602 Shortness of breath: Secondary | ICD-10-CM | POA: Diagnosis not present

## 2018-06-29 DIAGNOSIS — R197 Diarrhea, unspecified: Secondary | ICD-10-CM | POA: Diagnosis not present

## 2018-06-29 MED ORDER — PREDNISONE 20 MG PO TABS: ORAL_TABLET | ORAL | 0 refills | Status: DC

## 2018-06-29 MED ORDER — ALBUTEROL SULFATE (2.5 MG/3ML) 0.083% IN NEBU: 2.5000 mg | INHALATION_SOLUTION | RESPIRATORY_TRACT | 1 refills | Status: DC | PRN

## 2018-06-29 MED ORDER — FLUTICASONE PROPIONATE 50 MCG/ACT NA SUSP: 2.0000 | Freq: Every day | NASAL | 6 refills | Status: DC

## 2018-06-29 NOTE — Assessment & Plan Note (Addendum)
Patient with severe COPD at risk for deterioration. Sometimes difficult to tell how much is chronic COPD changes vs acute change. I don't see evidence of acute COPD exacerbation at this time. Will recommend short prednisone taper, duoneb x 2-3 days then return to albuterol neb PRN (pt already on anticholinergic anoro).  Symptoms markedly better on recent trip to [facility] - will add flonase in case allergic component to worsening symptoms.  I did recommend expedited pulm f/u - to consider augmentation of therapy (ICS vs low dose oral prednisone vs other). Pt agrees with plan.

## 2018-06-29 NOTE — Patient Instructions (Addendum)
I want you to take prednisone taper sent to pharmacy.  Use duoneb (albuterol/ipratropium) nebulizer three times daily over the next several days, then may return to plain albuterol as needed.  Schedule follow up with Dr Belia Heman for COPD check - see if you need to add on inhaled steroid to daily preventative medicines.  Add flonase nasal steroid 2 squirts into each nostril daily.

## 2018-06-29 NOTE — Telephone Encounter (Signed)
Received call from Dr Dulce Sellar who is seeing patient today for chronic diarrhea - notes deteriorated respiratory status, pt refuses ER eval. I asked her to go to [facility] or come in to office at 1:30pm today for evaluation. Misty Stanley please call patient later this morning to see if she wants to come in at 1:30.

## 2018-06-29 NOTE — Assessment & Plan Note (Signed)
Continues night time oxygen supplementation.

## 2018-06-29 NOTE — Progress Notes (Signed)
BP 126/68 (BP Location: Right Arm, Patient Position: Sitting, Cuff Size: Normal)   Pulse 84   Temp 97.6 F (36.4 C) (Oral)   Ht 5\' 4"  (1.626 m)   Wt 148 lb 8 oz (67.4 kg)   SpO2 97%   BMI 25.49 kg/m    CC: COPD  Subjective:    Patient ID: Stacey Durham, female    DOB: 1945-12-23, 72 y.o.   MRN: [id number]  HPI: Stacey Durham is a 73 y.o. female presenting on 06/29/2018 for COPD (Was seen by Dr. Dulce Sellar [facility]) this morning. He was concerned with pt's breathing. Pt accompanied by her friend, [name]. )   Received call from [facility] Dr Dulce Sellar today who was concerned about pt's respiratory status. He was seeing her for chronic diarrhea - unable to do colonoscopy due to respiratory status. F/u planned in 3 mo.  Chronic dyspnea at rest and with exertion. Last saw pulm 03/2018 ([facility]).  Partner feels she is more short winded, but that this normally occurs in the mornings, no increase in cough or increase in sputum production. Pt states she feels stable.  Denies fevers, chills, denies change in dyspnea.   Current COPD regimen: Anoro ellipta, singulair, and duoneb PRN  Not on inhaled corticosteroid.   H/o complicated severe COPD requiring prior hospitalizations. Recurrent COPD exacerbations, last 05/2018 treated with levaquin and prednisone with benefit.   No symptoms during recent trip to [place] (80 degree weather but wasn't too humid in the winter).      Relevant past medical, surgical, family and social history reviewed and updated as indicated. Interim medical history since our last visit reviewed. Allergies and medications reviewed and updated. Outpatient Medications Prior to Visit  Medication Sig Dispense Refill  . albuterol (PROVENTIL HFA;VENTOLIN HFA) 108 (90 Base) MCG/ACT inhaler Inhale 2 puffs into the lungs every 4 (four) hours as needed for wheezing or shortness of breath. 1 Inhaler 6  . Biotin 44034 MCG TABS Take 1 tablet by mouth.    . calcium-vitamin D (OSCAL  WITH D) 500-200 MG-UNIT tablet Take 1 tablet by mouth.    . Dextromethorphan-Guaifenesin (MUCINEX DM PO) Take 1 tablet by mouth daily.     Marland Kitchen ipratropium-albuterol (DUONEB) 0.5-2.5 (3) MG/3ML SOLN TAKE 3 MLS BY NEBULIZATION EVERY 6 (SIX) HOURS AS NEEDED. 360 mL 1  . montelukast (SINGULAIR) 10 MG tablet TAKE 1 TABLET BY MOUTH EVERY DAY 90 tablet 2  . Multiple Vitamins-Minerals (MULTIVITAMIN ADULT PO) Take 1 tablet by mouth.    . Probiotic Product (PROBIOTIC-10 PO) Take 1 tablet by mouth.    . Respiratory Therapy Supplies (FLUTTER) DEVI 1 each by Does not apply route once. 1 each 0  . umeclidinium-vilanterol (ANORO ELLIPTA) 62.5-25 MCG/INH AEPB Inhale 1 puff into the lungs daily. 1 each 5  . vitamin B-12 (CYANOCOBALAMIN) 1000 MCG tablet Take 1,000 mcg by mouth daily.    Marland Kitchen albuterol (PROVENTIL) (2.5 MG/3ML) 0.083% nebulizer solution Take 3 mLs (2.5 mg total) by nebulization every 4 (four) hours as needed for wheezing or shortness of breath. 300 mL 1  . levofloxacin (LEVAQUIN) 500 MG tablet Take 1 tablet (500 mg total) by mouth daily. 7 tablet 0  . predniSONE (DELTASONE) 20 MG tablet Take two tablets daily for 3 days followed by one tablet daily for 4 days 10 tablet 0   No facility-administered medications prior to visit.      Per HPI unless specifically indicated in ROS section below Review of Systems Objective:  BP 126/68 (BP Location: Right Arm, Patient Position: Sitting, Cuff Size: Normal)   Pulse 84   Temp 97.6 F (36.4 C) (Oral)   Ht 5\' 4"  (1.626 m)   Wt 148 lb 8 oz (67.4 kg)   SpO2 97%   BMI 25.49 kg/m   Wt Readings from Last 3 Encounters:  06/29/18 148 lb 8 oz (67.4 kg)  05/10/18 147 lb (66.7 kg)  05/02/18 148 lb (67.1 kg)    Physical Exam Vitals signs and nursing note reviewed.  Constitutional:      General: She is not in acute distress.    Appearance: Normal appearance.  Cardiovascular:     Rate and Rhythm: Normal rate and regular rhythm.     Pulses: Normal pulses.      Heart sounds: No murmur.     Comments: Distant heart sounds Pulmonary:     Effort: Pulmonary effort is normal.     Breath sounds: Decreased breath sounds, wheezing (mild) and rhonchi present.     Comments: Coarse harsh breath sounds throughout Neurological:     Mental Status: She is alert.  Psychiatric:        Mood and Affect: Mood normal.       Results for orders placed or performed in visit on 05/26/18  Fecal occult blood, imunochemical  Result Value Ref Range   Fecal Occult Bld Negative Negative   Assessment & Plan:   Problem List Items Addressed This Visit    Chronic respiratory failure with hypoxia (HCC)    Continues night time oxygen supplementation.      Chronic obstructive pulmonary disease (HCC) - Primary    Patient with severe COPD at risk for deterioration. Sometimes difficult to tell how much is chronic COPD changes vs acute change. I don't see evidence of acute COPD exacerbation at this time. Will recommend short prednisone taper, duoneb x 2-3 days then return to albuterol neb PRN (pt already on anticholinergic anoro).  Symptoms markedly better on recent trip to [facility] - will add flonase in case allergic component to worsening symptoms.  I did recommend expedited pulm f/u - to consider augmentation of therapy (ICS vs low dose oral prednisone vs other). Pt agrees with plan.       Relevant Medications   predniSONE (DELTASONE) 20 MG tablet   albuterol (PROVENTIL) (2.5 MG/3ML) 0.083% nebulizer solution   fluticasone (FLONASE) 50 MCG/ACT nasal spray       Meds ordered this encounter  Medications  . predniSONE (DELTASONE) 20 MG tablet    Sig: Take two tablets daily for 3 days followed by one tablet daily for 4 days    Dispense:  10 tablet    Refill:  0  . albuterol (PROVENTIL) (2.5 MG/3ML) 0.083% nebulizer solution    Sig: Take 3 mLs (2.5 mg total) by nebulization every 4 (four) hours as needed for wheezing or shortness of breath.    Dispense:  300 mL    Refill:  1    . fluticasone (FLONASE) 50 MCG/ACT nasal spray    Sig: Place 2 sprays into both nostrils daily.    Dispense:  16 g    Refill:  6   No orders of the defined types were placed in this encounter.   Follow up plan: Return if symptoms worsen or fail to improve.  Eustaquio Boyden, MD

## 2018-06-29 NOTE — Telephone Encounter (Signed)
Spoke with pt relaying Dr. Timoteo Expose message.  Pt states she wants to be seen here so she will be here at 1:30.

## 2018-07-18 ENCOUNTER — Ambulatory Visit
Admission: RE | Admit: 2018-07-18 | Discharge: 2018-07-18 | Disposition: A | Discharge status: Home / Self Care | Payer: Medicare HMO | Source: Ambulatory Visit | Attending: Family Medicine | Admitting: Family Medicine

## 2018-07-18 DIAGNOSIS — Z1231 Encounter for screening mammogram for malignant neoplasm of breast: Secondary | ICD-10-CM

## 2018-07-19 LAB — HM MAMMOGRAPHY

## 2018-07-20 ENCOUNTER — Encounter: Payer: Self-pay | Admitting: Family Medicine

## 2018-07-21 ENCOUNTER — Ambulatory Visit: Payer: Medicare HMO

## 2018-07-21 ENCOUNTER — Ambulatory Visit (INDEPENDENT_AMBULATORY_CARE_PROVIDER_SITE_OTHER): Payer: Medicare HMO

## 2018-07-21 VITALS — BP 132/80 | HR 83 | Temp 97.6°F | Ht 65.0 in | Wt 150.7 lb

## 2018-07-21 DIAGNOSIS — Z0000 Encounter for general adult medical examination without abnormal findings: Secondary | ICD-10-CM | POA: Diagnosis not present

## 2018-07-21 DIAGNOSIS — E782 Mixed hyperlipidemia: Secondary | ICD-10-CM | POA: Diagnosis not present

## 2018-07-21 DIAGNOSIS — R7303 Prediabetes: Secondary | ICD-10-CM | POA: Diagnosis not present

## 2018-07-21 DIAGNOSIS — E038 Other specified hypothyroidism: Secondary | ICD-10-CM

## 2018-07-21 DIAGNOSIS — E039 Hypothyroidism, unspecified: Secondary | ICD-10-CM | POA: Diagnosis not present

## 2018-07-21 LAB — COMPREHENSIVE METABOLIC PANEL
ALBUMIN: 3.9 g/dL (ref 3.5–5.2)
ALT: 20 U/L (ref 0–35)
AST: 18 U/L (ref 0–37)
Alkaline Phosphatase: 73 U/L (ref 39–117)
BUN: 23 mg/dL (ref 6–23)
CHLORIDE: 103 meq/L (ref 96–112)
CO2: 29 mEq/L (ref 19–32)
Calcium: 9.4 mg/dL (ref 8.4–10.5)
Creatinine, Ser: 1.13 mg/dL (ref 0.40–1.20)
GFR: 47.12 mL/min — ABNORMAL LOW (ref >60.00)
Glucose, Bld: 102 mg/dL — ABNORMAL HIGH (ref 70–99)
POTASSIUM: 4.2 meq/L (ref 3.5–5.1)
SODIUM: 140 meq/L (ref 135–145)
Total Bilirubin: 0.9 mg/dL (ref 0.2–1.2)
Total Protein: 6.7 g/dL (ref 6.0–8.3)

## 2018-07-21 LAB — TSH: TSH: 3.57 u[IU]/mL (ref 0.35–4.50)

## 2018-07-21 LAB — HEMOGLOBIN A1C: HEMOGLOBIN A1C: 6.4 % (ref 4.6–6.5)

## 2018-07-21 LAB — LIPID PANEL
Cholesterol: 204 mg/dL — ABNORMAL HIGH (ref 0–200)
HDL: 62.1 mg/dL (ref >39.00)
LDL Cholesterol: 103 mg/dL — ABNORMAL HIGH (ref 0–99)
NonHDL: 142.24
TRIGLYCERIDES: 194 mg/dL — AB (ref 0.0–149.0)
Total CHOL/HDL Ratio: 3
VLDL: 38.8 mg/dL (ref 0.0–40.0)

## 2018-07-21 LAB — LDL CHOLESTEROL, DIRECT: Direct LDL: 131 mg/dL

## 2018-07-21 NOTE — Progress Notes (Signed)
Subjective:   DEIDRE GREGOR is a 73 y.o. female who presents for Medicare Annual (Subsequent) preventive examination.  Review of Systems:  N/A Cardiac Risk Factors include: advanced age (>2men, >27 women);dyslipidemia     Objective:     Vitals: BP 132/80 (BP Location: Right Arm, Patient Position: Sitting, Cuff Size: Normal)   Pulse 83   Temp 97.6 F (36.4 C) (Oral)   Ht 5\' 5"  (1.651 m) Comment: no shoes  Wt 150 lb 11.2 oz (68.4 kg)   SpO2 94%   BMI 25.08 kg/m   Body mass index is 25.08 kg/m.  Advanced Directives 07/21/2018 01/29/2018 07/14/2017 10/29/2016 10/29/2016 06/22/2016 10/11/2015  Does Patient Have a Medical Advance Directive? No No No No No No No  Would patient like information on creating a medical advance directive? No - Patient declined No - Patient declined No - Patient declined No - Patient declined - - -    Tobacco Social History   Tobacco Use  Smoking Status Former Smoker  . Packs/day: 1.00  . Years: 40.00  . Pack years: 40.00  . Last attempt to quit: 06/01/2001  . Years since quitting: 17.1  Smokeless Tobacco Never Used     Counseling given: No   Clinical Intake:  Pre-visit preparation completed: Yes  Pain : No/denies pain Pain Score: 0-No pain     Nutritional Status: BMI 25 -29 Overweight Nutritional Risks: None Diabetes: No  How often do you need to have someone help you when you read instructions, pamphlets, or other written materials from your doctor or pharmacy?: 1 - Never What is the last grade level you completed in school?: 12th grade  Interpreter Needed?: No  Comments: pt is a widow  Information entered by :: LPinson, LPN  Past Medical History:  Diagnosis Date  . Acute cholecystitis   . Cholecystitis with cholelithiasis 10/11/2015  . COPD (chronic obstructive pulmonary disease) (HCC)   . Ex-smoker    quit 2000s  . Pneumonia 11/26/2015   Past Surgical History:  Procedure Laterality Date  . ABDOMINAL HYSTERECTOMY  1978     part of an ovary remained  . APPENDECTOMY  1978  . CATARACT EXTRACTION, BILATERAL  2013  . CHOLECYSTECTOMY N/A 10/11/2015   Procedure: LAPAROSCOPIC CHOLECYSTECTOMY;  Surgeon: Leafy Ro, MD;  Location: [facility] ORS;  Service: General;  Laterality: N/A;  . COLONOSCOPY  07/2016   HP, ext hem, diverticulosis, rpt 5 yrs (Outlaw)  . TONSILLECTOMY     Family History  Problem Relation Age of Onset  . Heart attack Father 43  . Diabetes Father   . Diabetes Mother   . Stroke Neg Hx   . Cancer Neg Hx    Social History   Socioeconomic History  . Marital status: Widowed    Spouse name: Not on file  . Number of children: Not on file  . Years of education: Not on file  . Highest education level: Not on file  Occupational History  . Not on file  Social Needs  . Financial resource strain: Not on file  . Food insecurity:    Worry: Not on file    Inability: Not on file  . Transportation needs:    Medical: Not on file    Non-medical: Not on file  Tobacco Use  . Smoking status: Former Smoker    Packs/day: 1.00    Years: 40.00    Pack years: 40.00    Last attempt to quit: 06/01/2001    Years since quitting:  17.1  . Smokeless tobacco: Never Used  Substance and Sexual Activity  . Alcohol use: No  . Drug use: No  . Sexual activity: Not on file  Lifestyle  . Physical activity:    Days per week: Not on file    Minutes per session: Not on file  . Stress: Not on file  Relationships  . Social connections:    Talks on phone: Not on file    Gets together: Not on file    Attends religious service: Not on file    Active member of club or organization: Not on file    Attends meetings of clubs or organizations: Not on file    Relationship status: Not on file  Other Topics Concern  . Not on file  Social History Narrative   Originally from [place]   Widower - husband passed away 2010/09/29   Lives alone, 1 chihuahua   GF of Page Spiro   Occ: retired, worked at [employer]:  enjoys going to [employer]: good water, fruits/vegetables daily    Outpatient Encounter Medications as of 07/21/2018  Medication Sig  . albuterol (PROVENTIL HFA;VENTOLIN HFA) 108 (90 Base) MCG/ACT inhaler Inhale 2 puffs into the lungs every 4 (four) hours as needed for wheezing or shortness of breath.  Marland Kitchen albuterol (PROVENTIL) (2.5 MG/3ML) 0.083% nebulizer solution Take 3 mLs (2.5 mg total) by nebulization every 4 (four) hours as needed for wheezing or shortness of breath.  . Biotin 44315 MCG TABS Take 1 tablet by mouth.  . calcium-vitamin D (OSCAL WITH D) 500-200 MG-UNIT tablet Take 1 tablet by mouth.  . Dextromethorphan-Guaifenesin (MUCINEX DM PO) Take 1 tablet by mouth daily.   . fluticasone (FLONASE) 50 MCG/ACT nasal spray Place 2 sprays into both nostrils daily.  Marland Kitchen ipratropium-albuterol (DUONEB) 0.5-2.5 (3) MG/3ML SOLN TAKE 3 MLS BY NEBULIZATION EVERY 6 (SIX) HOURS AS NEEDED.  Marland Kitchen montelukast (SINGULAIR) 10 MG tablet TAKE 1 TABLET BY MOUTH EVERY DAY  . Multiple Vitamins-Minerals (MULTIVITAMIN ADULT PO) Take 1 tablet by mouth.  . Probiotic Product (PROBIOTIC-10 PO) Take 1 tablet by mouth.  . Respiratory Therapy Supplies (FLUTTER) DEVI 1 each by Does not apply route once.  . umeclidinium-vilanterol (ANORO ELLIPTA) 62.5-25 MCG/INH AEPB Inhale 1 puff into the lungs daily.  . vitamin B-12 (CYANOCOBALAMIN) 1000 MCG tablet Take 1,000 mcg by mouth daily.  . [DISCONTINUED] predniSONE (DELTASONE) 20 MG tablet Take two tablets daily for 3 days followed by one tablet daily for 4 days   No facility-administered encounter medications on file as of 07/21/2018.     Activities of Daily Living In your present state of health, do you have any difficulty performing the following activities: 07/21/2018 01/29/2018  Hearing? N N  Vision? N N  Difficulty concentrating or making decisions? N N  Walking or climbing stairs? N N  Dressing or bathing? N N  Doing errands, shopping? N N  Preparing Food and eating ?  N -  Using the Toilet? N -  In the past six months, have you accidently leaked urine? N -  Do you have problems with loss of bowel control? Y -  Managing your Medications? N -  Managing your Finances? N -  Housekeeping or managing your Housekeeping? N -  Some recent data might be hidden    Patient Care Team: Eustaquio Boyden, MD as PCP - General (Family Medicine) Willis Modena, MD as Consulting Physician (Gastroenterology) Carrie Mew, OD as Consulting Physician (Optometry)  Assessment:   This is a routine wellness examination for Randa EvensJoanne.   Hearing Screening   125Hz  250Hz  500Hz  1000Hz  2000Hz  3000Hz  4000Hz  6000Hz  8000Hz   Right ear:   40 40 40  40    Left ear:   40 40 40  40    Vision Screening Comments: Vision exam in April 2019 with Dr. Lew Dawesitter   Exercise Activities and Dietary recommendations Current Exercise Habits: Home exercise routine;Structured exercise class(Silver Sneakers 45 min 1-2 days/wk; walking 30 min daily), Type of exercise: walking;stretching;strength training/weights, Time (Minutes): 30, Frequency (Times/Week): 7, Weekly Exercise (Minutes/Week): 210, Intensity: Moderate, Exercise limited by: None identified  Goals    . Increase physical activity     Starting 07/21/18, I will continue to exercise for 45 minutes 3 days per week.        Fall Risk Fall Risk  07/21/2018 07/14/2017 06/22/2016  Falls in the past year? 0 No No   Depression Screen PHQ 2/9 Scores 07/21/2018 07/14/2017 06/22/2016  PHQ - 2 Score 0 0 0  PHQ- 9 Score 0 0 -     Cognitive Function MMSE - Mini Mental State Exam 07/21/2018 07/14/2017 06/22/2016  Orientation to time 5 5 5   Orientation to Place 5 5 5   Registration 3 3 3   Attention/ Calculation 0 0 0  Recall 1 3 3   Recall-comments unable to recall 2 of 3 words - -  Language- name 2 objects 0 0 0  Language- repeat 1 1 1   Language- follow 3 step command 3 3 3   Language- read & follow direction 0 0 0  Write a sentence 0 0 0  Copy  design 0 0 0  Total score 18 20 20        PLEASE NOTE: A Mini-Cog screen was completed. Maximum score is 20. A value of 0 denotes this part of Folstein MMSE was not completed or the patient failed this part of the Mini-Cog screening.   Mini-Cog Screening Orientation to Time - Max 5 pts Orientation to Place - Max 5 pts Registration - Max 3 pts Recall - Max 3 pts Language Repeat - Max 1 pts Language Follow 3 Step Command - Max 3 pts   Immunization History  Administered Date(s) Administered  . Influenza Split 01/30/2015  . Influenza, High Dose Seasonal PF 02/07/2018  . Influenza,inj,Quad PF,6+ Mos 01/31/2016, 06/24/2017  . Influenza-Unspecified 06/05/2015  . Pneumococcal Conjugate-13 06/05/2015  . Pneumococcal Polysaccharide-23 03/28/2012  . Tdap 06/23/2012    Screening Tests Health Maintenance  Topic Date Due  . MAMMOGRAM  07/20/2019  . COLONOSCOPY  08/12/2021  . DTaP/Tdap/Td (2 - Td) 06/23/2022  . TETANUS/TDAP  06/23/2022  . INFLUENZA VACCINE  Completed  . DEXA SCAN  Completed  . Hepatitis C Screening  Completed  . PNA vac Low Risk Adult  Completed        Plan:     I have personally reviewed, addressed, and noted the following in the patient's chart:  A. Medical and social history B. Use of alcohol, tobacco or illicit drugs  C. Current medications and supplements D. Functional ability and status E.  Nutritional status F.  Physical activity G. Advance directives H. List of other physicians I.  Hospitalizations, surgeries, and ER visits in previous 12 months J.  Vitals K. Screenings to include hearing, vision, cognitive, depression L. Referrals and appointments - none  In addition, I have reviewed and discussed with patient certain preventive protocols, quality metrics, and best practice recommendations. A written personalized care plan  for preventive services as well as general preventive health recommendations were provided to patient.  See attached scanned  questionnaire for additional information.   Signed,   Randa EvensLesia , MHA, BS, LPN Health Coach

## 2018-07-21 NOTE — Progress Notes (Signed)
PCP notes:   Health maintenance:  No gaps identified.  Abnormal screenings:   Mini-Cog score: 18/20 MMSE - Mini Mental State Exam 07/21/2018 07/14/2017 06/22/2016  Orientation to time 5 5 5   Orientation to Place 5 5 5   Registration 3 3 3   Attention/ Calculation 0 0 0  Recall 1 3 3   Recall-comments unable to recall 2 of 3 words - -  Language- name 2 objects 0 0 0  Language- repeat 1 1 1   Language- follow 3 step command 3 3 3   Language- read & follow direction 0 0 0  Write a sentence 0 0 0  Copy design 0 0 0  Total score 18 20 20     Patient concerns:   None  Nurse concerns:  None  Next PCP appt:   07/28/18 @ 0930  I reviewed health advisor's note, was available for consultation on the day of service listed in this note, and agree with documentation and plan. Crawford Givens, MD.

## 2018-07-21 NOTE — Patient Instructions (Addendum)
Ms. Etsitty , Thank you for taking time to come for your Medicare Wellness Visit. I appreciate your ongoing commitment to your health goals. Please review the following plan we discussed and let me know if I can assist you in the future.   These are the goals we discussed: Goals    . Increase physical activity     Starting 07/21/18, I will continue to exercise for 45 minutes 3 days per week.        This is a list of the screening recommended for you and due dates:  Health Maintenance  Topic Date Due  . Mammogram  07/20/2019  . Colon Cancer Screening  08/12/2021  . DTaP/Tdap/Td vaccine (2 - Td) 06/23/2022  . Tetanus Vaccine  06/23/2022  . Flu Shot  Completed  . DEXA scan (bone density measurement)  Completed  .  Hepatitis C: One time screening is recommended by Center for Disease Control  (CDC) for  adults born from [birth year] through 1966.   Completed  . Pneumonia vaccines  Completed   Preventive Care for Adults  A healthy lifestyle and preventive care can promote health and wellness. Preventive health guidelines for adults include the following key practices.  . A routine yearly physical is a good way to check with your health care provider about your health and preventive screening. It is a chance to share any concerns and updates on your health and to receive a thorough exam.  . Visit your dentist for a routine exam and preventive care every 6 months. Brush your teeth twice a day and floss once a day. Good oral hygiene prevents tooth decay and gum disease.  . The frequency of eye exams is based on your age, health, family medical history, use  of contact lenses, and other factors. Follow your health care provider's recommendations for frequency of eye exams.  . Eat a healthy diet. Foods like vegetables, fruits, whole grains, low-fat dairy products, and lean protein foods contain the nutrients you need without too many calories. Decrease your intake of foods high in solid fats, added  sugars, and salt. Eat the right amount of calories for you. Get information about a proper diet from your health care provider, if necessary.  . Regular physical exercise is one of the most important things you can do for your health. Most adults should get at least 150 minutes of moderate-intensity exercise (any activity that increases your heart rate and causes you to sweat) each week. In addition, most adults need muscle-strengthening exercises on 2 or more days a week.  Silver Sneakers may be a benefit available to you. To determine eligibility, you may visit the website: www.silversneakers.com or contact program at 716 652 3203 Mon-Fri between 8AM-8PM.   . Maintain a healthy weight. The body mass index (BMI) is a screening tool to identify possible weight problems. It provides an estimate of body fat based on height and weight. Your health care provider can find your BMI and can help you achieve or maintain a healthy weight.   For adults 20 years and older: ? A BMI below 18.5 is considered underweight. ? A BMI of 18.5 to 24.9 is normal. ? A BMI of 25 to 29.9 is considered overweight. ? A BMI of 30 and above is considered obese.   . Maintain normal blood lipids and cholesterol levels by exercising and minimizing your intake of saturated fat. Eat a balanced diet with plenty of fruit and vegetables. Blood tests for lipids and cholesterol should begin at  age 55 and be repeated every 5 years. If your lipid or cholesterol levels are high, you are over 50, or you are at high risk for heart disease, you may need your cholesterol levels checked more frequently. Ongoing high lipid and cholesterol levels should be treated with medicines if diet and exercise are not working.  . If you smoke, find out from your health care provider how to quit. If you do not use tobacco, please do not start.  . If you choose to drink alcohol, please do not consume more than 2 drinks per day. One drink is considered to  be 12 ounces (355 mL) of beer, 5 ounces (148 mL) of wine, or 1.5 ounces (44 mL) of liquor.  . If you are 74-73 years old, ask your health care provider if you should take aspirin to prevent strokes.  . Use sunscreen. Apply sunscreen liberally and repeatedly throughout the day. You should seek shade when your shadow is shorter than you. Protect yourself by wearing long sleeves, pants, a wide-brimmed hat, and sunglasses year round, whenever you are outdoors.  . Once a month, do a whole body skin exam, using a mirror to look at the skin on your back. Tell your health care provider of new moles, moles that have irregular borders, moles that are larger than a pencil eraser, or moles that have changed in shape or color.

## 2018-07-23 ENCOUNTER — Other Ambulatory Visit: Payer: Self-pay | Admitting: Family Medicine

## 2018-07-25 DIAGNOSIS — J449 Chronic obstructive pulmonary disease, unspecified: Secondary | ICD-10-CM | POA: Diagnosis not present

## 2018-07-25 DIAGNOSIS — J411 Mucopurulent chronic bronchitis: Secondary | ICD-10-CM | POA: Diagnosis not present

## 2018-07-28 ENCOUNTER — Encounter: Payer: Self-pay | Admitting: Family Medicine

## 2018-07-28 ENCOUNTER — Ambulatory Visit (INDEPENDENT_AMBULATORY_CARE_PROVIDER_SITE_OTHER): Payer: Medicare HMO | Admitting: Family Medicine

## 2018-07-28 VITALS — BP 122/82 | HR 80 | Temp 97.6°F | Ht 65.0 in | Wt 149.3 lb

## 2018-07-28 DIAGNOSIS — Z7189 Other specified counseling: Secondary | ICD-10-CM

## 2018-07-28 DIAGNOSIS — K529 Noninfective gastroenteritis and colitis, unspecified: Secondary | ICD-10-CM

## 2018-07-28 DIAGNOSIS — E782 Mixed hyperlipidemia: Secondary | ICD-10-CM | POA: Diagnosis not present

## 2018-07-28 DIAGNOSIS — I700 Atherosclerosis of aorta: Secondary | ICD-10-CM

## 2018-07-28 DIAGNOSIS — N183 Chronic kidney disease, stage 3 unspecified: Secondary | ICD-10-CM | POA: Insufficient documentation

## 2018-07-28 DIAGNOSIS — J439 Emphysema, unspecified: Secondary | ICD-10-CM | POA: Diagnosis not present

## 2018-07-28 DIAGNOSIS — Z87891 Personal history of nicotine dependence: Secondary | ICD-10-CM | POA: Diagnosis not present

## 2018-07-28 DIAGNOSIS — J9611 Chronic respiratory failure with hypoxia: Secondary | ICD-10-CM | POA: Diagnosis not present

## 2018-07-28 DIAGNOSIS — E039 Hypothyroidism, unspecified: Secondary | ICD-10-CM | POA: Diagnosis not present

## 2018-07-28 DIAGNOSIS — R7303 Prediabetes: Secondary | ICD-10-CM

## 2018-07-28 DIAGNOSIS — Z0000 Encounter for general adult medical examination without abnormal findings: Secondary | ICD-10-CM

## 2018-07-28 DIAGNOSIS — E038 Other specified hypothyroidism: Secondary | ICD-10-CM

## 2018-07-28 DIAGNOSIS — M8580 Other specified disorders of bone density and structure, unspecified site: Secondary | ICD-10-CM

## 2018-07-28 MED ORDER — LOPERAMIDE HCL 2 MG PO TABS: 2.0000 mg | ORAL_TABLET | Freq: Every day | ORAL | 0 refills | Status: DC | PRN

## 2018-07-28 MED ORDER — ATORVASTATIN CALCIUM 20 MG PO TABS: 20.0000 mg | ORAL_TABLET | Freq: Every day | ORAL | 3 refills | Status: DC

## 2018-07-28 NOTE — Progress Notes (Signed)
BP 122/82 (BP Location: Left Arm, Patient Position: Sitting, Cuff Size: Normal)   Pulse 80   Temp 97.6 F (36.4 C) (Oral)   Ht 5\' 5"  (1.651 m)   Wt 149 lb 5 oz (67.7 kg)   SpO2 92%   BMI 24.85 kg/m    CC: CPE Subjective:    Patient ID: Stacey Durham, female    DOB: Oct 03, 1945, 73 y.o.   MRN: [id number]  HPI: Stacey Durham is a 73 y.o. female presenting on 07/28/2018 for Annual Exam (Pt 2.)   Saw Virl Axe last week for medicare wellness visit. Note reviewed.    Severe COPD with chronic dyspnea and wheezing - has not returned to see pulm ([facility]). On singulair, anoro ellipta (umeclidinium-vilanterol), duonebs PRN. Had normal 6 minute walk test at pulm 04/2018. Flonase added last month - she feels this has significantly helped. Uses oxygen 2L at night time.   She feels diarrhea has improved with addition of imodium.   Preventative: COLONOSCOPY 07/2016 HP, ext hem, diverticulosis, rpt 5 yrs (Outlaw) Lung cancer screening -recent chest CT 12/2016 stable. Completed screening.  Breast cancer screening -mammo WNL 07/2018. She does not do breast exams at home.  Well woman exam -s/p hysterectomy 10-11-76, part of an ovary remains. DEXA T score -2.2 spine, -1.2 hip 2017-10-11). Takes 1 cal/vit D pill daily. Discussed weight bearing exercise.  Flu shot -yearly Tdap 2012-10-11 Pneumovax 03/2012, prevnar 06/2015 shingrix - discussed Advanced directive discussion - packet previously provided. HCPOA - likely [name] but not fully sure. Seat belt use discussed Sunscreen usediscussed. No changing moles on skin. Ex smoker quit 2001-10-11 Alcohol - none Dentist - doesn't see, has full dentures Eye exam yearly  Originally from [place] - husband passed away 10-12-2010 Lives alone, 1 chihuahua GF of Page Spiro Occ: retired, worked at [employer]: enjoys going to [employer]: good water, fruits/vegetables daily     Relevant past medical, surgical, family and social history reviewed  and updated as indicated. Interim medical history since our last visit reviewed. Allergies and medications reviewed and updated. Outpatient Medications Prior to Visit  Medication Sig Dispense Refill  . albuterol (PROVENTIL HFA;VENTOLIN HFA) 108 (90 Base) MCG/ACT inhaler Inhale 2 puffs into the lungs every 4 (four) hours as needed for wheezing or shortness of breath. 1 Inhaler 6  . albuterol (PROVENTIL) (2.5 MG/3ML) 0.083% nebulizer solution Take 3 mLs (2.5 mg total) by nebulization every 4 (four) hours as needed for wheezing or shortness of breath. 300 mL 1  . Biotin 60156 MCG TABS Take 1 tablet by mouth.    . calcium-vitamin D (OSCAL WITH D) 500-200 MG-UNIT tablet Take 1 tablet by mouth.    . Dextromethorphan-Guaifenesin (MUCINEX DM PO) Take 1 tablet by mouth daily.     . fluticasone (FLONASE) 50 MCG/ACT nasal spray Place 2 sprays into both nostrils daily. 16 g 6  . ipratropium-albuterol (DUONEB) 0.5-2.5 (3) MG/3ML SOLN TAKE 3 MLS BY NEBULIZATION EVERY 6 (SIX) HOURS AS NEEDED. 360 mL 1  . montelukast (SINGULAIR) 10 MG tablet TAKE 1 TABLET BY MOUTH EVERY DAY 90 tablet 0  . Multiple Vitamins-Minerals (MULTIVITAMIN ADULT PO) Take 1 tablet by mouth.    . Probiotic Product (PROBIOTIC-10 PO) Take 1 tablet by mouth.    . Respiratory Therapy Supplies (FLUTTER) DEVI 1 each by Does not apply route once. 1 each 0  . umeclidinium-vilanterol (ANORO ELLIPTA) 62.5-25 MCG/INH AEPB Inhale 1 puff into the lungs daily. 1 each 5  . vitamin  B-12 (CYANOCOBALAMIN) 1000 MCG tablet Take 1,000 mcg by mouth daily.     No facility-administered medications prior to visit.      Per HPI unless specifically indicated in ROS section below Review of Systems  Constitutional: Negative for activity change, appetite change, chills, fatigue, fever and unexpected weight change.  HENT: Negative for hearing loss.   Eyes: Negative for visual disturbance.  Respiratory: Positive for cough, shortness of breath and wheezing. Negative  for chest tightness.   Cardiovascular: Negative for chest pain, palpitations and leg swelling.  Gastrointestinal: Negative for abdominal distention, abdominal pain, blood in stool, constipation, diarrhea, nausea and vomiting.  Genitourinary: Negative for difficulty urinating and hematuria.  Musculoskeletal: Negative for arthralgias, myalgias and neck pain.  Skin: Negative for rash.  Neurological: Positive for headaches. Negative for dizziness, seizures and syncope.  Hematological: Negative for adenopathy. Does not bruise/bleed easily.  Psychiatric/Behavioral: Negative for dysphoric mood. The patient is not nervous/anxious.    Objective:    BP 122/82 (BP Location: Left Arm, Patient Position: Sitting, Cuff Size: Normal)   Pulse 80   Temp 97.6 F (36.4 C) (Oral)   Ht 5\' 5"  (1.651 m)   Wt 149 lb 5 oz (67.7 kg)   SpO2 92%   BMI 24.85 kg/m   Wt Readings from Last 3 Encounters:  07/28/18 149 lb 5 oz (67.7 kg)  07/21/18 150 lb 11.2 oz (68.4 kg)  06/29/18 148 lb 8 oz (67.4 kg)    Physical Exam Vitals signs and nursing note reviewed.  Constitutional:      General: She is not in acute distress.    Appearance: She is well-developed.  HENT:     Head: Normocephalic and atraumatic.     Right Ear: Hearing, tympanic membrane, ear canal and external ear normal.     Left Ear: Hearing, tympanic membrane, ear canal and external ear normal.     Nose: Nose normal.     Mouth/Throat:     Mouth: Mucous membranes are moist.     Pharynx: Uvula midline. No oropharyngeal exudate or posterior oropharyngeal erythema.  Eyes:     General: No scleral icterus.    Conjunctiva/sclera: Conjunctivae normal.     Pupils: Pupils are equal, round, and reactive to light.  Neck:     Musculoskeletal: Normal range of motion and neck supple.  Cardiovascular:     Rate and Rhythm: Normal rate and regular rhythm.     Pulses: Normal pulses.          Radial pulses are 2+ on the right side and 2+ on the left side.      Heart sounds: Normal heart sounds. No murmur.  Pulmonary:     Effort: Pulmonary effort is normal. No respiratory distress.     Breath sounds: Normal breath sounds. No wheezing, rhonchi or rales.  Abdominal:     General: Bowel sounds are normal. There is no distension.     Palpations: Abdomen is soft. There is no mass.     Tenderness: There is no abdominal tenderness. There is no guarding or rebound.  Musculoskeletal: Normal range of motion.  Lymphadenopathy:     Cervical: No cervical adenopathy.  Skin:    General: Skin is warm and dry.     Findings: No rash.  Neurological:     Mental Status: She is alert and oriented to person, place, and time.     Comments: CN grossly intact, station and gait intact  Psychiatric:        Mood  and Affect: Mood normal.        Behavior: Behavior normal.        Thought Content: Thought content normal.        Judgment: Judgment normal.       Results for orders placed or performed in visit on 07/21/18  Comprehensive metabolic panel  Result Value Ref Range   Sodium 140 135 - 145 mEq/L   Potassium 4.2 3.5 - 5.1 mEq/L   Chloride 103 96 - 112 mEq/L   CO2 29 19 - 32 mEq/L   Glucose, Bld 102 (H) 70 - 99 mg/dL   BUN 23 6 - 23 mg/dL   Creatinine, Ser 1.61 0.40 - 1.20 mg/dL   Total Bilirubin 0.9 0.2 - 1.2 mg/dL   Alkaline Phosphatase 73 39 - 117 U/L   AST 18 0 - 37 U/L   ALT 20 0 - 35 U/L   Total Protein 6.7 6.0 - 8.3 g/dL   Albumin 3.9 3.5 - 5.2 g/dL   Calcium 9.4 8.4 - 09.6 mg/dL   GFR 04.54 (L) >09.81 mL/min  TSH  Result Value Ref Range   TSH 3.57 0.35 - 4.50 uIU/mL  Hemoglobin A1c  Result Value Ref Range   Hgb A1c MFr Bld 6.4 4.6 - 6.5 %  Lipid Panel  Result Value Ref Range   Cholesterol 204 (H) 0 - 200 mg/dL   Triglycerides 191.4 (H) 0.0 - 149.0 mg/dL   HDL 78.29 >56.21 mg/dL   VLDL 30.8 0.0 - 65.7 mg/dL   LDL Cholesterol 846 (H) 0 - 99 mg/dL   Total CHOL/HDL Ratio 3    NonHDL 142.24   LDL Cholesterol, Direct  Result Value Ref Range     Direct LDL 131.0 mg/dL   Assessment & Plan:   Problem List Items Addressed This Visit    Subclinical hypothyroidism    TSH stable today. Notes weight gain over the past year.       Prediabetes    She knows to avoid added sugars in diet. Recent prednisone courses have likely contributed to worsening readings.       Osteopenia    Discussed calcium, vit D, weight bearing exercise.       HLD (hyperlipidemia)    Chronic off meds. Discussed pros/cons of statin. Pt interested in trial of statin. Sent in atorvastatin  The 10-year ASCVD risk score Denman George DC Jr., et al., 2013) is: 11.9%   Values used to calculate the score:     Age: 44 years     Sex: Female     Is Non-Hispanic African American: No     Diabetic: No     Tobacco smoker: No     Systolic Blood Pressure: 122 mmHg     Is BP treated: No     HDL Cholesterol: 62.1 mg/dL     Total Cholesterol: 204 mg/dL       Relevant Medications   atorvastatin (LIPITOR) 20 MG tablet   Health maintenance examination - Primary    Preventative protocols reviewed and updated unless pt declined. Discussed healthy diet and lifestyle.       Ex-smoker    Continue abstinence.       CKD (chronic kidney disease) stage 3, GFR 30-59 ml/min (HCC)    Reviewed with patient, rec avoid NSAIDs and stay well hydrated      Chronic respiratory failure with hypoxia (HCC)   Chronic obstructive pulmonary disease (HCC)    Severe, oxygen dependent at night. She feels flonase has  significantly helped symptoms. Today relatively stable from a respiratory status. Continue current regimen.       Chronic diarrhea    Saw GI but unable to complete evaluation due to tenuous respiratory status at that time. Diarrhea is currently well managed on imodium PRN.       Aortic atherosclerosis (HCC)    See above. Start statin.       Relevant Medications   atorvastatin (LIPITOR) 20 MG tablet   Advanced care planning/counseling discussion    Advanced directive  discussion - packet previously provided. HCPOA - likely [name] but not fully sure.          Meds ordered this encounter  Medications  . loperamide (IMODIUM A-D) 2 MG tablet    Sig: Take 1 tablet (2 mg total) by mouth daily as needed for diarrhea or loose stools.    Refill:  0  . atorvastatin (LIPITOR) 20 MG tablet    Sig: Take 1 tablet (20 mg total) by mouth daily.    Dispense:  90 tablet    Refill:  3   No orders of the defined types were placed in this encounter.   Follow up plan: Return in about 6 months (around 01/26/2019) for follow up visit.  Eustaquio Boyden, MD

## 2018-07-28 NOTE — Assessment & Plan Note (Signed)
Continue abstinence

## 2018-07-28 NOTE — Assessment & Plan Note (Signed)
Preventative protocols reviewed and updated unless pt declined. Discussed healthy diet and lifestyle.  

## 2018-07-28 NOTE — Assessment & Plan Note (Signed)
TSH stable today. Notes weight gain over the past year.

## 2018-07-28 NOTE — Assessment & Plan Note (Signed)
See above. Start statin.

## 2018-07-28 NOTE — Assessment & Plan Note (Signed)
Severe, oxygen dependent at night. She feels flonase has significantly helped symptoms. Today relatively stable from a respiratory status. Continue current regimen.

## 2018-07-28 NOTE — Assessment & Plan Note (Signed)
Chronic off meds. Discussed pros/cons of statin. Pt interested in trial of statin. Sent in atorvastatin 20mg  The 10-year ASCVD risk score Denman George DC Jr., et al., 2013) is: 11.9%   Values used to calculate the score:     Age: 73 years     Sex: Female     Is Non-Hispanic African American: No     Diabetic: No     Tobacco smoker: No     Systolic Blood Pressure: 122 mmHg     Is BP treated: No     HDL Cholesterol: 62.1 mg/dL     Total Cholesterol: 204 mg/dL

## 2018-07-28 NOTE — Assessment & Plan Note (Signed)
Advanced directive discussion - packet previously provided. HCPOA - likely [name] but not fully sure.

## 2018-07-28 NOTE — Assessment & Plan Note (Signed)
Discussed calcium, vit D, weight bearing exercise.

## 2018-07-28 NOTE — Assessment & Plan Note (Signed)
She knows to avoid added sugars in diet. Recent prednisone courses have likely contributed to worsening readings.

## 2018-07-28 NOTE — Assessment & Plan Note (Deleted)
Saw GI but unable to complete evaluation due to tenuous respiratory status at that time. Diarrhea is currently well managed on imodium PRN.

## 2018-07-28 NOTE — Assessment & Plan Note (Signed)
Saw GI but unable to complete evaluation due to tenuous respiratory status at that time. Diarrhea is currently well managed on imodium PRN.  

## 2018-07-28 NOTE — Assessment & Plan Note (Signed)
Reviewed with patient, rec avoid NSAIDs and stay well hydrated

## 2018-07-28 NOTE — Patient Instructions (Addendum)
If interested, check with pharmacy about new 2 shot shingles series (shingrix).  You are doing well today Return as needed or in 6 months for follow up visit.   Health Maintenance After Age 73 After age 61, you are at a higher risk for certain long-term diseases and infections as well as injuries from falls. Falls are a major cause of broken bones and head injuries in people who are older than age 22. Getting regular preventive care can help to keep you healthy and well. Preventive care includes getting regular testing and making lifestyle changes as recommended by your health care provider. Talk with your health care provider about:  Which screenings and tests you should have. A screening is a test that checks for a disease when you have no symptoms.  A diet and exercise plan that is right for you. What should I know about screenings and tests to prevent falls? Screening and testing are the best ways to find a health problem early. Early diagnosis and treatment give you the best chance of managing medical conditions that are common after age 65. Certain conditions and lifestyle choices may make you more likely to have a fall. Your health care provider may recommend:  Regular vision checks. Poor vision and conditions such as cataracts can make you more likely to have a fall. If you wear glasses, make sure to get your prescription updated if your vision changes.  Medicine review. Work with your health care provider to regularly review all of the medicines you are taking, including over-the-counter medicines. Ask your health care provider about any side effects that may make you more likely to have a fall. Tell your health care provider if any medicines that you take make you feel dizzy or sleepy.  Osteoporosis screening. Osteoporosis is a condition that causes the bones to get weaker. This can make the bones weak and cause them to break more easily.  Blood pressure screening. Blood pressure changes  and medicines to control blood pressure can make you feel dizzy.  Strength and balance checks. Your health care provider may recommend certain tests to check your strength and balance while standing, walking, or changing positions.  Foot health exam. Foot pain and numbness, as well as not wearing proper footwear, can make you more likely to have a fall.  Depression screening. You may be more likely to have a fall if you have a fear of falling, feel emotionally low, or feel unable to do activities that you used to do.  Alcohol use screening. Using too much alcohol can affect your balance and may make you more likely to have a fall. What actions can I take to lower my risk of falls? General instructions  Talk with your health care provider about your risks for falling. Tell your health care provider if: ? You fall. Be sure to tell your health care provider about all falls, even ones that seem minor. ? You feel dizzy, sleepy, or off-balance.  Take over-the-counter and prescription medicines only as told by your health care provider. These include any supplements.  Eat a healthy diet and maintain a healthy weight. A healthy diet includes low-fat dairy products, low-fat (lean) meats, and fiber from whole grains, beans, and lots of fruits and vegetables. Home safety  Remove any tripping hazards, such as rugs, cords, and clutter.  Install safety equipment such as grab bars in bathrooms and safety rails on stairs.  Keep rooms and walkways well-lit. Activity   Follow a regular exercise  program to stay fit. This will help you maintain your balance. Ask your health care provider what types of exercise are appropriate for you.  If you need a cane or walker, use it as recommended by your health care provider.  Wear supportive shoes that have nonskid soles. Lifestyle  Do not drink alcohol if your health care provider tells you not to drink.  If you drink alcohol, limit how much you  have: ? 0-1 drink a day for women. ? 0-2 drinks a day for men.  Be aware of how much alcohol is in your drink. In the [place] one drink equals one typical bottle of beer (12 oz), one-half glass of wine (5 oz), or one shot of hard liquor (1 oz).  Do not use any products that contain nicotine or tobacco, such as cigarettes and e-cigarettes. If you need help quitting, ask your health care provider. Summary  Having a healthy lifestyle and getting preventive care can help to protect your health and wellness after age 59.  Screening and testing are the best way to find a health problem early and help you avoid having a fall. Early diagnosis and treatment give you the best chance for managing medical conditions that are more common for people who are older than age 61.  Falls are a major cause of broken bones and head injuries in people who are older than age 32. Take precautions to prevent a fall at home.  Work with your health care provider to learn what changes you can make to improve your health and wellness and to prevent falls. This information is not intended to replace advice given to you by your health care provider. Make sure you discuss any questions you have with your health care provider. Document Released: 03/31/2017 Document Revised: 03/31/2017 Document Reviewed: 03/31/2017 Elsevier Interactive Patient Education  2019 [employer].

## 2018-07-30 DIAGNOSIS — J449 Chronic obstructive pulmonary disease, unspecified: Secondary | ICD-10-CM | POA: Diagnosis not present

## 2018-07-30 DIAGNOSIS — J411 Mucopurulent chronic bronchitis: Secondary | ICD-10-CM | POA: Diagnosis not present

## 2018-08-08 ENCOUNTER — Ambulatory Visit (INDEPENDENT_AMBULATORY_CARE_PROVIDER_SITE_OTHER): Payer: Medicare HMO | Admitting: Internal Medicine

## 2018-08-08 ENCOUNTER — Encounter: Payer: Self-pay | Admitting: Internal Medicine

## 2018-08-08 VITALS — BP 120/70 | HR 83 | Resp 16 | Ht 65.0 in | Wt 150.0 lb

## 2018-08-08 DIAGNOSIS — J441 Chronic obstructive pulmonary disease with (acute) exacerbation: Secondary | ICD-10-CM

## 2018-08-08 DIAGNOSIS — R69 Illness, unspecified: Secondary | ICD-10-CM | POA: Diagnosis not present

## 2018-08-08 MED ORDER — METHYLPREDNISOLONE ACETATE 40 MG/ML IJ SUSP: 40.0000 mg | Freq: Once | INTRAMUSCULAR | Status: DC

## 2018-08-08 MED ORDER — IPRATROPIUM-ALBUTEROL 0.5-2.5 (3) MG/3ML IN SOLN
3.0000 mL | RESPIRATORY_TRACT | Status: AC
  Administered 2018-08-08: 3 mL via RESPIRATORY_TRACT

## 2018-08-08 MED ORDER — METHYLPREDNISOLONE ACETATE 80 MG/ML IJ SUSP
40.0000 mg | Freq: Once | INTRAMUSCULAR | Status: AC
  Administered 2018-08-08: 40 mg via INTRAMUSCULAR

## 2018-08-08 MED ORDER — AZITHROMYCIN 250 MG PO TABS: ORAL_TABLET | ORAL | 0 refills | Status: DC

## 2018-08-08 MED ORDER — METHYLPREDNISOLONE ACETATE 80 MG/ML IJ SUSP
80.0000 mg | Freq: Once | INTRAMUSCULAR | Status: AC
  Administered 2018-08-08: 80 mg via INTRAMUSCULAR

## 2018-08-08 MED ORDER — IPRATROPIUM-ALBUTEROL 0.5-2.5 (3) MG/3ML IN SOLN: 3.0000 mL | RESPIRATORY_TRACT | 1 refills | Status: DC | PRN

## 2018-08-08 MED ORDER — PREDNISONE 20 MG PO TABS: 40.0000 mg | ORAL_TABLET | Freq: Every day | ORAL | 0 refills | Status: DC

## 2018-08-08 NOTE — Progress Notes (Signed)
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  albuterol (PROVENTIL HFA;VENTOLIN HFA) 108 (90 Base) MCG/ACT inhaler, Inhale 2 puffs into the lungs every 4 (four) hours as needed for wheezing or shortness of breath., Disp: 1 Inhaler, Rfl: 6 .  albuterol (PROVENTIL) (2.5 MG/3ML) 0.083% nebulizer solution, Take 3 mLs (2.5 mg total) by nebulization every 4 (four) hours as needed for wheezing or shortness of breath., Disp: 300 mL, Rfl: 1 .  atorvastatin (LIPITOR) 20 MG tablet, Take 1 tablet (20 mg total) by mouth daily., Disp: 90 tablet, Rfl: 3 .  Biotin 58850 MCG TABS, Take 1 tablet by mouth., Disp: , Rfl:  .  calcium-vitamin D (OSCAL WITH D) 500-200 MG-UNIT tablet, Take 1 tablet by mouth., Disp: , Rfl:  .  Dextromethorphan-Guaifenesin (MUCINEX DM PO), Take 1 tablet by mouth daily. , Disp: , Rfl:  .  fluticasone (FLONASE) 50 MCG/ACT nasal spray, Place 2 sprays into both nostrils daily., Disp: 16 g, Rfl: 6 .  ipratropium-albuterol (DUONEB) 0.5-2.5 (3) MG/3ML SOLN, Take 3 mLs by nebulization every 4 (four) hours as needed., Disp: 360 mL, Rfl: 1 .  loperamide (IMODIUM A-D) 2 MG tablet, Take 1 tablet (2 mg total) by mouth daily as needed for diarrhea or loose stools., Disp: , Rfl: 0 .  montelukast (SINGULAIR) 10 MG tablet, TAKE 1 TABLET BY MOUTH EVERY DAY, Disp: 90 tablet, Rfl: 0 .  Multiple Vitamins-Minerals (MULTIVITAMIN ADULT PO), Take 1 tablet by mouth., Disp: , Rfl:  .  Probiotic Product (PROBIOTIC-10 PO), Take 1 tablet by mouth., Disp: , Rfl:  .  Respiratory Therapy Supplies (FLUTTER) DEVI, 1 each by Does not apply route once.,  Disp: 1 each, Rfl: 0 .  umeclidinium-vilanterol (ANORO ELLIPTA) 62.5-25 MCG/INH AEPB, Inhale 1 puff into the lungs daily., Disp: 1 each, Rfl: 5 .  vitamin B-12 (CYANOCOBALAMIN) 1000 MCG tablet, Take 1,000 mcg by mouth daily., Disp: , Rfl:  .  azithromycin (ZITHROMAX Z-PAK) 250 MG tablet, Take 2 tablets on Day 1 and then 1 tablet daily till gone., Disp: 6 each, Rfl: 0 .  predniSONE (DELTASONE) 20 MG tablet, Take 2 tablets (40 mg total) by mouth daily with breakfast. 10 days, Disp: 20 tablet, Rfl: 0     ALLERGIES   Patient has no known allergies.     REVIEW OF SYSTEMS   Review of Systems  Constitutional: Negative for chills, fever, malaise/fatigue and weight loss.  HENT: Negative for congestion.   Respiratory: Positive for cough, shortness of breath and wheezing. Negative for hemoptysis and sputum production.        Chronic SOB/DOE at baseline  Cardiovascular: Negative for chest pain, palpitations, orthopnea and leg swelling.  All other systems reviewed and are negative.   BP 120/70 (BP Location: Left Arm, Cuff Size: Normal)   Pulse 83   Resp 16   Ht 5\' 5"  (1.651 m)   Wt 150 lb (68 kg)   SpO2 91%   BMI 24.96 kg/m    PHYSICAL EXAM   Physical Exam  Constitutional: She is oriented to person, place, and time. She appears well-developed and well-nourished. She appears  distressed.  HENT:  Mouth/Throat: No oropharyngeal exudate.  Eyes: EOM are normal. No scleral icterus.  Cardiovascular: Normal rate, regular rhythm and normal heart sounds.  No murmur heard. Pulmonary/Chest: No stridor. She is in respiratory distress. She has wheezes. She has no rales.  Musculoskeletal: Normal range of motion.        General: No edema.  Neurological: She is alert and oriented to person, place, and time.  Skin: Skin is warm.  Psychiatric: She has a normal mood and affect.   CT chest Lung cancer Screening: 08/2015 No nodules/massess PFT 08/2015 Ratio 52% fev1 1.1 46%  fvc 2.1 67%   WNL ONO WNL     ASSESSMENT/PLAN    73 year old white female with severe COPD based on pulmonary function testing however with gold stage a symptoms with history of extensive smoking history   +COPD EXACERBATION ACUTE OFFICE VISIT PATIENT GIVEN DOUNEB IN OFFICE IM STEROID SHOT 120 MG DEPOMEDROL PREDNISONE 40 mg daily for 10 days Z pak ADVISED DOUNEBS EVERY 4 HRS FOR NEXT 2 DAYS  CONTINUE OXYGEN AS PRESCRIBED SHE USES AND BENEFITS FROM 2L [place] AT NIGHT    I ADVISED TO GO TO ER IF SYMPTOMS WORSEN     [name], M.D.  [name]  Medical Director [facility] Medical Director [facility] Cardio-Pulmonary Department

## 2018-08-08 NOTE — Patient Instructions (Signed)
Z pak Antibiotics  PREDNISONE 40 MG DAILY FOR 10 days  DOUNEBS NEBULIZER EVERY 4 hrs for next 2 days

## 2018-08-08 NOTE — Addendum Note (Signed)
Addended by: Janean Sark on: 08/08/2018 10:45 AM   Modules accepted: Orders

## 2018-08-28 DIAGNOSIS — J449 Chronic obstructive pulmonary disease, unspecified: Secondary | ICD-10-CM | POA: Diagnosis not present

## 2018-08-28 DIAGNOSIS — J411 Mucopurulent chronic bronchitis: Secondary | ICD-10-CM | POA: Diagnosis not present

## 2018-09-04 DIAGNOSIS — R69 Illness, unspecified: Secondary | ICD-10-CM | POA: Diagnosis not present

## 2018-09-23 ENCOUNTER — Telehealth: Payer: Self-pay | Admitting: Internal Medicine

## 2018-09-23 ENCOUNTER — Telehealth: Payer: Self-pay

## 2018-09-23 MED ORDER — MONTELUKAST SODIUM 5 MG PO CHEW: 10.0000 mg | CHEWABLE_TABLET | Freq: Every day | ORAL | 0 refills | Status: DC

## 2018-09-23 NOTE — Telephone Encounter (Signed)
Spoke to patient, 5 mg chewable tabs sent in to pharmacy. Patient aware.

## 2018-09-23 NOTE — Telephone Encounter (Signed)
Yes that's ok

## 2018-09-23 NOTE — Telephone Encounter (Signed)
PA started in covermymeds for singulair 5 mg chew tabs.  (Key: [id number]). Sent to plan. Will await approval.

## 2018-09-23 NOTE — Telephone Encounter (Signed)
Called [facility] and confirmed that singulair 10mg  is in fact on back order, however they do have singulair 5mg  in stock.  Left message making pt aware that a message has been sent to DK.  DK please advise if an alternative can be called in.

## 2018-09-26 NOTE — Telephone Encounter (Signed)
PA for Singulair 5mg  has been denied by insurance.  Spoke to [facility], who stated that zafirlukast 20mg  is a covered alternative with a $49.00 copay.    DK please advise if okay to switch to alternative. Thanks

## 2018-09-28 DIAGNOSIS — J411 Mucopurulent chronic bronchitis: Secondary | ICD-10-CM | POA: Diagnosis not present

## 2018-09-28 DIAGNOSIS — J449 Chronic obstructive pulmonary disease, unspecified: Secondary | ICD-10-CM | POA: Diagnosis not present

## 2018-09-28 MED ORDER — ZAFIRLUKAST 20 MG PO TABS: 20.0000 mg | ORAL_TABLET | Freq: Two times a day (BID) | ORAL | 2 refills | Status: DC

## 2018-09-28 NOTE — Telephone Encounter (Signed)
Just to verify, is it BID?

## 2018-09-28 NOTE — Telephone Encounter (Signed)
Yes.  BID

## 2018-09-28 NOTE — Telephone Encounter (Signed)
Ok to switch 

## 2018-09-28 NOTE — Telephone Encounter (Signed)
Rx for Accolate 20mg  bid has been sent to preferred pharmacy.  Pt is aware of this information and voiced her understanding. Nothing further is needed.

## 2018-09-29 ENCOUNTER — Telehealth: Payer: Self-pay | Admitting: Internal Medicine

## 2018-09-29 NOTE — Telephone Encounter (Signed)
Pt said that she got the medication Singulair it was back in stock.

## 2018-09-29 NOTE — Telephone Encounter (Signed)
Called and spoke to pt.  Pt stated that [facility] different [facility], and was able to get Singulair 10mg . Nothing further is needed at this time.

## 2018-09-29 NOTE — Telephone Encounter (Signed)
Spoke to patient, let her know she could call her insurance and see what else might be covered for Accolate 20 mg to replace singulair 10 mg.   Called and spoke to [facility], they did confirm the cost is $292. They were not sure what someone may have been looking at when the quoted $49 last week.   Will await call from patient.

## 2018-10-13 ENCOUNTER — Other Ambulatory Visit: Payer: Self-pay | Admitting: Internal Medicine

## 2018-10-15 ENCOUNTER — Other Ambulatory Visit: Payer: Self-pay | Admitting: Internal Medicine

## 2018-10-15 DIAGNOSIS — J441 Chronic obstructive pulmonary disease with (acute) exacerbation: Secondary | ICD-10-CM

## 2018-10-28 DIAGNOSIS — J411 Mucopurulent chronic bronchitis: Secondary | ICD-10-CM | POA: Diagnosis not present

## 2018-10-28 DIAGNOSIS — J449 Chronic obstructive pulmonary disease, unspecified: Secondary | ICD-10-CM | POA: Diagnosis not present

## 2018-11-14 ENCOUNTER — Telehealth: Payer: Self-pay | Admitting: Internal Medicine

## 2018-11-14 NOTE — Telephone Encounter (Signed)
Called and spoke to patient, these are not new symptoms, okay to proceed with visit. Patient aware.

## 2018-11-14 NOTE — Telephone Encounter (Signed)
Called patient for COVID-19 pre-screening for in office visit.  Have you recently traveled any where out of the local area in the last 2 weeks? NO Have you been in close contact with a person diagnosed with COVID-19 within the last 2 weeks? NO Do you currently have any of the following symptoms? If so, when did they start? Cough (YES)     Diarrhea  Joint Pain Fever      Muscle Pain  Red eyes Shortness of breath (YES)   Abdominal painVomiting Loss of smell    Rash   Sore Throat Headache    Weakness  Bruising or bleeding  Pt states that she has these symptoms on and off due to her COPD.   Okay to proceed with visit. (date)  / Needs to reschedule visit. (date)

## 2018-11-15 ENCOUNTER — Encounter: Payer: Self-pay | Admitting: Internal Medicine

## 2018-11-15 ENCOUNTER — Other Ambulatory Visit: Payer: Self-pay

## 2018-11-15 ENCOUNTER — Ambulatory Visit: Payer: Medicare HMO | Admitting: Internal Medicine

## 2018-11-15 VITALS — BP 126/78 | HR 67 | Temp 97.5°F | Ht 65.0 in | Wt 152.4 lb

## 2018-11-15 DIAGNOSIS — J449 Chronic obstructive pulmonary disease, unspecified: Secondary | ICD-10-CM | POA: Diagnosis not present

## 2018-11-15 MED ORDER — TRELEGY ELLIPTA 100-62.5-25 MCG/INH IN AEPB: 1.0000 | INHALATION_SPRAY | Freq: Every morning | RESPIRATORY_TRACT | 6 refills | Status: DC

## 2018-11-15 NOTE — Progress Notes (Signed)
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  albuterol (PROVENTIL) (2.5 MG/3ML) 0.083% nebulizer solution, Take 3 mLs (2.5 mg total) by nebulization every 4 (four) hours as needed for wheezing or shortness of breath., Disp: 300 mL, Rfl: 1 .  albuterol (VENTOLIN HFA) 108 (90 Base) MCG/ACT inhaler, INHALE 2 PUFFS INTO THE LUNGS EVERY 4 (FOUR) HOURS AS NEEDED FOR WHEEZING OR SHORTNESS OF BREATH., Disp: 18 Inhaler, Rfl: 6 .  ANORO ELLIPTA 62.5-25 MCG/INH AEPB, TAKE 1 PUFF BY MOUTH EVERY DAY, Disp: 60 each, Rfl: 5 .  atorvastatin (LIPITOR) 20 MG tablet, Take 1 tablet (20 mg total) by mouth daily., Disp: 90 tablet, Rfl: 3 .  azithromycin (ZITHROMAX Z-PAK) 250 MG tablet, Take 2 tablets on Day 1 and then 1 tablet daily till gone., Disp: 6 each, Rfl: 0 .  Biotin 4098110000 MCG TABS, Take 1 tablet by mouth., Disp: , Rfl:  .  calcium-vitamin D (OSCAL WITH D) 500-200 MG-UNIT tablet, Take 1 tablet by mouth., Disp: , Rfl:  .  Dextromethorphan-Guaifenesin (MUCINEX DM PO), Take 1 tablet by mouth daily. , Disp: , Rfl:  .  fluticasone (FLONASE) 50 MCG/ACT nasal spray, Place 2 sprays into both nostrils daily., Disp: 16 g, Rfl: 6 .  ipratropium-albuterol (DUONEB) 0.5-2.5 (3) MG/3ML SOLN, Take 3 mLs by nebulization every 4 (four) hours as needed., Disp: 360 mL, Rfl: 1 .  loperamide (IMODIUM A-D) 2 MG tablet, Take 1 tablet (2 mg total) by mouth daily as  needed for diarrhea or loose stools., Disp: , Rfl: 0 .  montelukast (SINGULAIR) 10 MG tablet, TAKE 1 TABLET BY MOUTH EVERY DAY, Disp: 90 tablet, Rfl: 0 .  Multiple Vitamins-Minerals (MULTIVITAMIN ADULT PO), Take 1 tablet by mouth., Disp: , Rfl:  .  predniSONE (DELTASONE) 20 MG tablet, Take 2 tablets (40 mg total) by mouth daily with breakfast. 10 days, Disp: 20 tablet, Rfl: 0 .  Probiotic Product (PROBIOTIC-10 PO), Take 1 tablet by mouth., Disp: , Rfl:  .  Respiratory Therapy Supplies (FLUTTER) DEVI, 1 each by Does not apply route once., Disp: 1 each, Rfl: 0 .  vitamin B-12 (CYANOCOBALAMIN) 1000 MCG tablet, Take 1,000 mcg by mouth daily., Disp: , Rfl:      ALLERGIES   Patient has no known allergies.     REVIEW OF SYSTEMS   Review of Systems  Constitutional: Negative for chills, fever, malaise/fatigue and weight loss.  HENT: Negative for congestion.   Respiratory: Positive for cough and wheezing. Negative for hemoptysis, sputum production and shortness of breath.        Chronic SOB/DOE at baseline  Cardiovascular: Negative for chest pain, palpitations, orthopnea and leg swelling.  All other systems reviewed and are negative.   BP 126/78 (BP Location: Left Arm, Cuff Size: Normal)   Pulse 67   Temp (!) 97.5 F (36.4 C) (Oral)   Ht 5\' 5"  (1.651 m)   Wt 152  lb 6.4 oz (69.1 kg)   SpO2 96%   BMI 25.36 kg/m    PHYSICAL EXAM   Physical Exam  Constitutional: She is oriented to person, place, and time. She appears well-developed and well-nourished. No distress.  HENT:  Mouth/Throat: No oropharyngeal exudate.  Eyes: EOM are normal. No scleral icterus.  Cardiovascular: Normal rate, regular rhythm and normal heart sounds.  No murmur heard. Pulmonary/Chest: No stridor. No respiratory distress. She has no wheezes. She has no rales.  Musculoskeletal: Normal range of motion.        General: No edema.  Neurological: She is alert and oriented to person, place, and time.  Skin: Skin  is warm.  Psychiatric: She has a normal mood and affect.   CT chest Lung cancer Screening: 08/2015 No nodules/massess PFT 08/2015 Ratio 52% fev1 1.1 46%  fvc 2.1 67%  6MWT WNL ONO WNL     ASSESSMENT/PLAN   73 year old pleasant white female with severe COPD based on pulmonary function testing in the setting of Gold stage D with previous history of COPD exacerbation with a longstanding history of smoking and tobacco abuse and chronic hypoxic respiratory failure with nocturnal hypoxia  Chronic shortness of breath and dyspnea on exertion related to severe COPD  and deconditioned state Continue treatment for COPD and exercise as tolerated  Severe COPD Gold stage D Stop ANORO  START TRELEGY TRIPLE INHALER     Nocturnal hypoxia chronic respiratory failure with hypoxia Continue oxygen as prescribed Patient uses and benefits from oxygen therapy and needs this for survival Continue 2 L nasal cannula at night   COVID-19 EDUCATION: The signs and symptoms of COVID-19 were discussed with the patient and how to seek care for testing.  The importance of social distancing was discussed today. Hand Washing Techniques and avoid touching face was advised.  MEDICATION ADJUSTMENTS/LABS AND TESTS ORDERED:  CURRENT MEDICATIONS REVIEWED AT LENGTH WITH PATIENT TODAY   Patient satisfied with Plan of action and management. All questions answered Follow up in 3 months   Stacey Durham Stacey Durham, M.D.  [facility]  Medical Director [facility] Cardio-Pulmonary Department

## 2018-11-15 NOTE — Patient Instructions (Addendum)
Continue oxygen as prescribed  Stop ANORO and start [place]  Avoid irritants

## 2018-11-28 DIAGNOSIS — J411 Mucopurulent chronic bronchitis: Secondary | ICD-10-CM | POA: Diagnosis not present

## 2018-11-28 DIAGNOSIS — J449 Chronic obstructive pulmonary disease, unspecified: Secondary | ICD-10-CM | POA: Diagnosis not present

## 2018-12-07 DIAGNOSIS — H524 Presbyopia: Secondary | ICD-10-CM | POA: Diagnosis not present

## 2018-12-21 ENCOUNTER — Other Ambulatory Visit: Payer: Self-pay | Admitting: Family Medicine

## 2018-12-28 DIAGNOSIS — J449 Chronic obstructive pulmonary disease, unspecified: Secondary | ICD-10-CM | POA: Diagnosis not present

## 2018-12-28 DIAGNOSIS — J411 Mucopurulent chronic bronchitis: Secondary | ICD-10-CM | POA: Diagnosis not present

## 2019-01-28 DIAGNOSIS — J411 Mucopurulent chronic bronchitis: Secondary | ICD-10-CM | POA: Diagnosis not present

## 2019-01-28 DIAGNOSIS — J449 Chronic obstructive pulmonary disease, unspecified: Secondary | ICD-10-CM | POA: Diagnosis not present

## 2019-02-03 DIAGNOSIS — R69 Illness, unspecified: Secondary | ICD-10-CM | POA: Diagnosis not present

## 2019-02-17 ENCOUNTER — Other Ambulatory Visit: Payer: Self-pay | Admitting: Family Medicine

## 2019-02-28 DIAGNOSIS — J411 Mucopurulent chronic bronchitis: Secondary | ICD-10-CM | POA: Diagnosis not present

## 2019-02-28 DIAGNOSIS — J449 Chronic obstructive pulmonary disease, unspecified: Secondary | ICD-10-CM | POA: Diagnosis not present

## 2019-03-02 ENCOUNTER — Telehealth: Payer: Self-pay

## 2019-03-02 NOTE — Telephone Encounter (Signed)
Noted  

## 2019-03-02 NOTE — Telephone Encounter (Signed)
Pt said on 03/01/19 pt started with rt middle back pain where bra line would be; hurts worse when takes deep breath; pt fell 1 wk ago in shower but no bruises upper back; Pt has no covid symptoms except diarrhea for yrs and SOB but o more than usual pt has COPD, no travel and no known exposure to + covid.No CP, no weakness in extremities and no swelling in legs or feet. Pt said she is not going to UC or ED. Dr Darnell Level said OK to schedule with him 03/03/19 for 15' appt at 10:15. Pt voiced understanding. ED precautions given to pt and pt voiced understanding. FYI to Dr Darnell Level.

## 2019-03-03 ENCOUNTER — Encounter: Payer: Self-pay | Admitting: Family Medicine

## 2019-03-03 ENCOUNTER — Other Ambulatory Visit: Payer: Self-pay

## 2019-03-03 ENCOUNTER — Ambulatory Visit (INDEPENDENT_AMBULATORY_CARE_PROVIDER_SITE_OTHER): Payer: Medicare HMO | Admitting: Family Medicine

## 2019-03-03 ENCOUNTER — Ambulatory Visit (INDEPENDENT_AMBULATORY_CARE_PROVIDER_SITE_OTHER)
Admission: RE | Admit: 2019-03-03 | Discharge: 2019-03-03 | Disposition: A | Discharge status: Home / Self Care | Payer: Medicare HMO | Source: Ambulatory Visit | Attending: Family Medicine | Admitting: Family Medicine

## 2019-03-03 VITALS — BP 128/72 | HR 71 | Temp 98.0°F | Ht 65.0 in | Wt 149.5 lb

## 2019-03-03 DIAGNOSIS — S299XXA Unspecified injury of thorax, initial encounter: Secondary | ICD-10-CM | POA: Diagnosis not present

## 2019-03-03 DIAGNOSIS — J439 Emphysema, unspecified: Secondary | ICD-10-CM | POA: Diagnosis not present

## 2019-03-03 DIAGNOSIS — M549 Dorsalgia, unspecified: Secondary | ICD-10-CM

## 2019-03-03 DIAGNOSIS — J9611 Chronic respiratory failure with hypoxia: Secondary | ICD-10-CM

## 2019-03-03 NOTE — Patient Instructions (Signed)
I think you strained your ribcage on the right back.  Treat with tylenol 500-mg with meals as needed and heating pad. This should continue to improve each day.  xrays today to check lungs. Let us know if not improving with above.

## 2019-03-03 NOTE — Progress Notes (Signed)
This visit was conducted in person.  BP 128/72 (BP Location: Left Arm, Patient Position: Sitting, Cuff Size: Normal)    Pulse 71    Temp 98 F (36.7 C) (Temporal)    Ht 5\' 5"  (1.651 m)    Wt 149 lb 8 oz (67.8 kg)    SpO2 95%    BMI 24.88 kg/m    CC: back pain Subjective:    Patient ID: Stacey Durham, female    DOB: 08/01/1945, 73 y.o.   MRN: [id number]  HPI: Stacey Durham is a 73 y.o. female presenting on 03/03/2019 for Back Pain (C/o mid back pain on right side when taking a deep breath.  Started on 03/01/19, however pt fell in the shower about 1 wk ago. )   3d h/o R middle to lower back pain worse with deep breath.  Hadn't been using oxygen - but did notice that when she restarted it did help back pain.  Denies chest pain, cough, fever, abd pain, nausea/vomiting, urinary changes.  Known chronic dyspnea from severe COPD with recurrent exacerbations. Now on trelegy with significant benefit! Prescribed nocturnal oxygen.   She did have fall in the shower 1 wk ago - landed on L buttock with bruising. She did hit head on commode but she did not injure R back.   Planning roadtrip to 03/03/19 in 2 weeks.      Relevant past medical, surgical, family and social history reviewed and updated as indicated. Interim medical history since our last visit reviewed. Allergies and medications reviewed and updated. Outpatient Medications Prior to Visit  Medication Sig Dispense Refill   albuterol (PROVENTIL) (2.5 MG/3ML) 0.083% nebulizer solution Take 3 mLs (2.5 mg total) by nebulization every 4 (four) hours as needed for wheezing or shortness of breath. 300 mL 1   albuterol (VENTOLIN HFA) 108 (90 Base) MCG/ACT inhaler INHALE 2 PUFFS INTO THE LUNGS EVERY 4 (FOUR) HOURS AS NEEDED FOR WHEEZING OR SHORTNESS OF BREATH. 18 Inhaler 6   ANORO ELLIPTA 62.5-25 MCG/INH AEPB TAKE 1 PUFF BY MOUTH EVERY DAY 60 each 5   atorvastatin (LIPITOR) 20 MG tablet Take 1 tablet (20 mg total) by mouth daily.  90 tablet 3   Biotin 06-[birth year]-1985 MCG TABS Take 1 tablet by mouth.     calcium-vitamin D (OSCAL WITH D) 500-200 MG-UNIT tablet Take 1 tablet by mouth.     Dextromethorphan-Guaifenesin (MUCINEX DM PO) Take 1 tablet by mouth daily.      fluticasone (FLONASE) 50 MCG/ACT nasal spray SPRAY 2 SPRAYS INTO EACH NOSTRIL EVERY DAY 48 mL 1   Fluticasone-Umeclidin-Vilant (TRELEGY ELLIPTA) 100-62.5-25 MCG/INH AEPB Inhale 1 Act into the lungs every morning. 1 each 6   ipratropium-albuterol (DUONEB) 0.5-2.5 (3) MG/3ML SOLN Take 3 mLs by nebulization every 4 (four) hours as needed. 360 mL 1   loperamide (IMODIUM A-D) 2 MG tablet Take 1 tablet (2 mg total) by mouth daily as needed for diarrhea or loose stools.  0   montelukast (SINGULAIR) 10 MG tablet TAKE 1 TABLET BY MOUTH EVERY DAY 90 tablet 2   Multiple Vitamins-Minerals (MULTIVITAMIN ADULT PO) Take 1 tablet by mouth.     predniSONE (DELTASONE) 20 MG tablet Take 2 tablets (40 mg total) by mouth daily with breakfast. 10 days 20 tablet 0   Probiotic Product (PROBIOTIC-10 PO) Take 1 tablet by mouth.     Respiratory Therapy Supplies (FLUTTER) DEVI 1 each by Does not apply route once. 1 each 0   vitamin B-12 (CYANOCOBALAMIN) 1000  MCG tablet Take 1,000 mcg by mouth daily.     No facility-administered medications prior to visit.      Per HPI unless specifically indicated in ROS section below Review of Systems Objective:    BP 128/72 (BP Location: Left Arm, Patient Position: Sitting, Cuff Size: Normal)    Pulse 71    Temp 98 F (36.7 C) (Temporal)    Ht 5\' 5"  (1.651 m)    Wt 149 lb 8 oz (67.8 kg)    SpO2 95%    BMI 24.88 kg/m   Wt Readings from Last 3 Encounters:  03/03/19 149 lb 8 oz (67.8 kg)  11/15/18 152 lb 6.4 oz (69.1 kg)  08/08/18 150 lb (68 kg)    Physical Exam Vitals signs and nursing note reviewed.  Constitutional:      General: She is not in acute distress.    Appearance: Normal appearance. She is not ill-appearing.  Cardiovascular:      Rate and Rhythm: Normal rate and regular rhythm.     Pulses: Normal pulses.     Heart sounds: Normal heart sounds. No murmur.  Pulmonary:     Effort: Pulmonary effort is normal. No respiratory distress.     Breath sounds: Normal breath sounds. No wheezing, rhonchi or rales.  Chest:     Chest wall: Tenderness present. No edema.     Comments: Reproducible tenderness to R lower ribcage along ~T8 level Skin:    Findings: Bruising (bruising to L buttock) present.  Neurological:     Mental Status: She is alert.       Assessment & Plan:   Problem List Items Addressed This Visit    Upper back pain on right side - Primary    Anticipate ribcage strain after fall suffered 1 wk ago in shower.  Supportive care reviewed - rec heating pad and tylenol PRN. Avoid muscle relaxant given age, avoid NSAIDs in COPD.  Check CXR given pleuritic nature of pain and extensive COPD history.  Update if not improving with treatment.       Relevant Orders   DG Chest 2 View   Chronic respiratory failure with hypoxia (HCC)    Had not been using night time oxygen - rec restart this.       Chronic obstructive pulmonary disease (HCC)    Marked improvement noted on trelegy Encouraged continued nocturnal supplemental oxygen use.           No orders of the defined types were placed in this encounter.  Orders Placed This Encounter  Procedures   DG Chest 2 View    Standing Status:   Future    Number of Occurrences:   1    Standing Expiration Date:   05/02/2020    Order Specific Question:   Reason for Exam (SYMPTOM  OR DIAGNOSIS REQUIRED)    Answer:   R back pain after fall 1 wk ago    Order Specific Question:   Preferred imaging location?    Answer:   [facility]    Order Specific Question:   Radiology Contrast Protocol - do NOT remove file path    Answer:   [email or web address]    Follow up plan: No follow-ups on file.  Ria Bush, MD

## 2019-03-03 NOTE — Assessment & Plan Note (Signed)
Marked improvement noted on trelegy Encouraged continued nocturnal supplemental oxygen use.

## 2019-03-03 NOTE — Assessment & Plan Note (Addendum)
Anticipate ribcage strain after fall suffered 1 wk ago in shower.  Supportive care reviewed - rec heating pad and tylenol PRN. Avoid muscle relaxant given age, avoid NSAIDs in COPD.  Check CXR given pleuritic nature of pain and extensive COPD history.  Update if not improving with treatment.

## 2019-03-03 NOTE — Assessment & Plan Note (Signed)
Had not been using night time oxygen - rec restart this.

## 2019-03-03 NOTE — Addendum Note (Signed)
Addended by: Ria Bush on: 03/03/2019 10:36 AM   Modules accepted: Level of Service

## 2019-03-16 ENCOUNTER — Encounter: Payer: Self-pay | Admitting: Internal Medicine

## 2019-03-16 ENCOUNTER — Ambulatory Visit (INDEPENDENT_AMBULATORY_CARE_PROVIDER_SITE_OTHER): Payer: Medicare HMO | Admitting: Internal Medicine

## 2019-03-16 ENCOUNTER — Other Ambulatory Visit: Payer: Self-pay

## 2019-03-16 VITALS — BP 122/74 | HR 70 | Temp 97.3°F | Ht 65.0 in | Wt 152.2 lb

## 2019-03-16 DIAGNOSIS — J449 Chronic obstructive pulmonary disease, unspecified: Secondary | ICD-10-CM

## 2019-03-16 MED ORDER — TRELEGY ELLIPTA 100-62.5-25 MCG/INH IN AEPB: 1.0000 | INHALATION_SPRAY | Freq: Every day | RESPIRATORY_TRACT | 0 refills | Status: DC

## 2019-03-16 NOTE — Patient Instructions (Signed)
Continue Inhalers as prescribed Check ONO

## 2019-03-16 NOTE — Progress Notes (Signed)
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  albuterol (PROVENTIL) (2.5 MG/3ML) 0.083% nebulizer solution, Take 3 mLs (2.5 mg total) by nebulization every 4 (four) hours as needed for wheezing or shortness of breath., Disp: 300 mL, Rfl: 1 .  albuterol (VENTOLIN HFA) 108 (90 Base) MCG/ACT inhaler, INHALE 2 PUFFS INTO THE LUNGS EVERY 4 (FOUR) HOURS AS NEEDED FOR WHEEZING OR SHORTNESS OF BREATH., Disp: 18 Inhaler, Rfl: 6 .  atorvastatin (LIPITOR) 20 MG tablet, Take 1 tablet (20 mg total) by mouth daily., Disp: 90 tablet, Rfl: 3 .  Biotin 40981 MCG TABS, Take 1 tablet by mouth., Disp: , Rfl:  .  calcium-vitamin D (OSCAL WITH D) 500-200 MG-UNIT tablet, Take 1 tablet by mouth., Disp: , Rfl:  .  Dextromethorphan-Guaifenesin (MUCINEX DM PO), Take 1 tablet by mouth daily. , Disp: , Rfl:  .  fluticasone (FLONASE) 50 MCG/ACT nasal spray, SPRAY 2 SPRAYS INTO EACH NOSTRIL EVERY DAY, Disp: 48 mL, Rfl: 1 .  Fluticasone-Umeclidin-Vilant (TRELEGY ELLIPTA) 100-62.5-25 MCG/INH AEPB, Inhale 1 Act into the lungs every morning., Disp: 1 each, Rfl: 6 .  ipratropium-albuterol (DUONEB) 0.5-2.5 (3) MG/3ML SOLN, Take 3 mLs by nebulization every 4 (four) hours as needed., Disp: 360 mL, Rfl: 1 .  loperamide (IMODIUM A-D) 2 MG tablet, Take 1 tablet (2 mg total) by mouth daily as needed for diarrhea or loose stools., Disp: , Rfl: 0 .  montelukast (SINGULAIR)  10 MG tablet, TAKE 1 TABLET BY MOUTH EVERY DAY, Disp: 90 tablet, Rfl: 2 .  Multiple Vitamins-Minerals (MULTIVITAMIN ADULT PO), Take 1 tablet by mouth., Disp: , Rfl:  .  Probiotic Product (PROBIOTIC-10 PO), Take 1 tablet by mouth., Disp: , Rfl:  .  Respiratory Therapy Supplies (FLUTTER) DEVI, 1 each by Does not apply route once., Disp: 1 each, Rfl: 0 .  vitamin B-12 (CYANOCOBALAMIN) 1000 MCG tablet, Take 1,000 mcg by mouth daily., Disp: , Rfl:      ALLERGIES   Patient has no known allergies.     REVIEW OF SYSTEMS   Review of Systems  Constitutional: Negative for chills, fever, malaise/fatigue and weight loss.  HENT: Negative for congestion.   Respiratory: Positive for cough and wheezing. Negative for hemoptysis, sputum production and shortness of breath.        Chronic SOB/DOE at baseline  Cardiovascular: Negative for chest pain, palpitations, orthopnea and leg swelling.  All other systems reviewed and are negative.   BP 122/74 (BP Location: Left Arm, Cuff Size: Normal)   Pulse 70   Temp (!) 97.3 F (36.3 C) (Oral)   Ht 5\' 5"  (1.651 m)   Wt 152 lb 3.2 oz (69 kg)   SpO2 97% Comment: on RA  BMI 25.33 kg/m    PHYSICAL EXAM   Physical Exam  Constitutional: She is oriented to person, place, and time. She appears well-developed and well-nourished. No distress.  HENT:  Mouth/Throat: No oropharyngeal  exudate.  Eyes: EOM are normal. No scleral icterus.  Cardiovascular: Normal rate, regular rhythm and normal heart sounds.  No murmur heard. Pulmonary/Chest: No stridor. No respiratory distress. She has no wheezes. She has no rales.  Musculoskeletal: Normal range of motion.        General: No edema.  Neurological: She is alert and oriented to person, place, and time.  Skin: Skin is warm.  Psychiatric: She has a normal mood and affect.   CT chest Lung cancer Screening: 08/2015 No nodules/massess PFT 08/2015 Ratio 52% fev1 1.1 46%  fvc 2.1 67%  6MWT WNL ONO WNL      ASSESSMENT/PLAN   73 year old pleasant white female with severe COPD based on pulmonary function testing in the setting of Gold stage D with previous history of COPD exacerbation with a longstanding history of smoking and tobacco abuse and chronic hypoxic respiratory failure with nocturnal hypoxia  Chronic shortness of breath and dyspnea on exertion related to severe COPD  and deconditioned state Continue treatment for COPD and exercise as tolerated  Severe COPD Gold stage D Stop ANORO  START TRELEGY TRIPLE INHALER     Nocturnal hypoxia chronic respiratory failure with hypoxia Continue oxygen as prescribed Patient uses and benefits from oxygen therapy and needs this for survival Continue 2 L nasal cannula at night   COVID-19 EDUCATION: The signs and symptoms of COVID-19 were discussed with the patient and how to seek care for testing.  The importance of social distancing was discussed today. Hand Washing Techniques and avoid touching face was advised.  MEDICATION ADJUSTMENTS/LABS AND TESTS ORDERED:  CURRENT MEDICATIONS REVIEWED AT LENGTH WITH PATIENT TODAY   Patient satisfied with Plan of action and management. All questions answered Follow up in 3 months   Gwenlyn Hottinger Patricia Pesa, M.D.  [facility]  Medical Director [facility] Cardio-Pulmonary Department

## 2019-03-16 NOTE — Addendum Note (Signed)
Addended by: Rosana Berger on: 03/16/2019 02:45 PM   Modules accepted: Orders

## 2019-03-16 NOTE — Progress Notes (Signed)
[facility] Pulmonary Medicine Consultation     Date: 03/16/2019,   MRN# [id number] Stacey Durham 1945-07-02   CHIEF COMPLAINT:  Follow-up COPD   HISTORY OF PRESENT ILLNESS    Since starting triple therapy with Trelegy Patient has been doing much better with her breathing She is more active There is no wheezing There is no cough  Chronic shortness of breath and dyspnea exertion seems to have improved  No COPD exacerbation in the last 3 months She continues to wear oxygen as needed However she needs a repeat overnight pulse oximetry to assess nocturnal hypoxia  No  COPD exacerbation at this time No evidence of heart failure at this time No evidence or signs of infection at this time No respiratory distress No fevers, chills, nausea, vomiting, diarrhea No evidence of lower extremity edema No evidence hemoptysis     Current Medication:   Current Outpatient Medications:  .  albuterol (PROVENTIL) (2.5 MG/3ML) 0.083% nebulizer solution, Take 3 mLs (2.5 mg total) by nebulization every 4 (four) hours as needed for wheezing or shortness of breath., Disp: 300 mL, Rfl: 1 .  albuterol (VENTOLIN HFA) 108 (90 Base) MCG/ACT inhaler, INHALE 2 PUFFS INTO THE LUNGS EVERY 4 (FOUR) HOURS AS NEEDED FOR WHEEZING OR SHORTNESS OF BREATH., Disp: 18 Inhaler, Rfl: 6 .  atorvastatin (LIPITOR) 20 MG tablet, Take 1 tablet (20 mg total) by mouth daily., Disp: 90 tablet, Rfl: 3 .  Biotin 93235 MCG TABS, Take 1 tablet by mouth., Disp: , Rfl:  .  calcium-vitamin D (OSCAL WITH D) 500-200 MG-UNIT tablet, Take 1 tablet by mouth., Disp: , Rfl:  .  Dextromethorphan-Guaifenesin (MUCINEX DM PO), Take 1 tablet by mouth daily. , Disp: , Rfl:  .  fluticasone (FLONASE) 50 MCG/ACT nasal spray, SPRAY 2 SPRAYS INTO EACH NOSTRIL EVERY DAY, Disp: 48 mL, Rfl: 1 .  Fluticasone-Umeclidin-Vilant (TRELEGY ELLIPTA) 100-62.5-25 MCG/INH AEPB, Inhale 1 Act into the lungs every morning., Disp: 1 each, Rfl: 6 .   ipratropium-albuterol (DUONEB) 0.5-2.5 (3) MG/3ML SOLN, Take 3 mLs by nebulization every 4 (four) hours as needed., Disp: 360 mL, Rfl: 1 .  loperamide (IMODIUM A-D) 2 MG tablet, Take 1 tablet (2 mg total) by mouth daily as needed for diarrhea or loose stools., Disp: , Rfl: 0 .  montelukast (SINGULAIR) 10 MG tablet, TAKE 1 TABLET BY MOUTH EVERY DAY, Disp: 90 tablet, Rfl: 2 .  Multiple Vitamins-Minerals (MULTIVITAMIN ADULT PO), Take 1 tablet by mouth., Disp: , Rfl:  .  Probiotic Product (PROBIOTIC-10 PO), Take 1 tablet by mouth., Disp: , Rfl:  .  Respiratory Therapy Supplies (FLUTTER) DEVI, 1 each by Does not apply route once., Disp: 1 each, Rfl: 0 .  vitamin B-12 (CYANOCOBALAMIN) 1000 MCG tablet, Take 1,000 mcg by mouth daily., Disp: , Rfl:      ALLERGIES   Patient has no known allergies.     REVIEW OF SYSTEMS   Review of Systems  Constitutional: Negative for chills, fever, malaise/fatigue and weight loss.  HENT: Negative for congestion.   Respiratory: Positive for cough and wheezing. Negative for hemoptysis, sputum production and shortness of breath.        Chronic SOB/DOE at baseline  Cardiovascular: Negative for chest pain, palpitations, orthopnea and leg swelling.  All other systems reviewed and are negative.   BP 122/74 (BP Location: Left Arm, Cuff Size: Normal)   Pulse 70   Temp (!) 97.3 F (36.3 C) (Oral)   Ht 5\' 5"  (1.651 m)   Wt 152  lb 3.2 oz (69 kg)   SpO2 97% Comment: on RA  BMI 25.33 kg/m    PHYSICAL EXAM   Physical Exam  Constitutional: She is oriented to person, place, and time. She appears well-developed and well-nourished. No distress.  HENT:  Mouth/Throat: No oropharyngeal exudate.  Eyes: EOM are normal. No scleral icterus.  Cardiovascular: Normal rate, regular rhythm and normal heart sounds.  No murmur heard. Pulmonary/Chest: No stridor. No respiratory distress. She has no wheezes. She has no rales.  Musculoskeletal: Normal range of motion.         General: No edema.  Neurological: She is alert and oriented to person, place, and time.  Skin: Skin is warm.  Psychiatric: She has a normal mood and affect.   CT chest Lung cancer Screening: 08/2015 No nodules/massess PFT 08/2015 Ratio 52% fev1 1.1 46%  fvc 2.1 67%  6MWT WNL ONO WNL     ASSESSMENT/PLAN   Severe COPD based on pulmonary function testing Gold stage D Previous history of COPD exacerbations Continue Trelegy therapy Continue albuterol as needed  Chronic hypoxic respiratory failure Patient needs reassessment of her overnight pulse oximetry to assess for nocturnal hypoxia Joselyn Arrow has been ordered   Chronic shortness of breath and dyspnea exertion related to her COPD and deconditioned state Continue activity as tolerated    COVID-19 EDUCATION: The signs and symptoms of COVID-19 were discussed with the patient and how to seek care for testing.  The importance of social distancing was discussed today. Hand Washing Techniques and avoid touching face was advised.  MEDICATION ADJUSTMENTS/LABS AND TESTS ORDERED: Continue Trelegy as prescribed Check overnight pulse oximetry CURRENT MEDICATIONS REVIEWED AT LENGTH WITH PATIENT TODAY   Patient satisfied with Plan of action and management. All questions answered Follow up in 6 months   Shakita Keir Patricia Pesa, M.D.  [facility]  Medical Director [place] Director [facility] Cardio-Pulmonary Department

## 2019-03-30 ENCOUNTER — Emergency Department
Admission: EM | Admit: 2019-03-30 | Discharge: 2019-03-31 | Disposition: A | Discharge status: Home / Self Care | Payer: Medicare HMO | Attending: Emergency Medicine | Admitting: Emergency Medicine

## 2019-03-30 ENCOUNTER — Encounter: Payer: Self-pay | Admitting: Intensive Care

## 2019-03-30 ENCOUNTER — Telehealth: Payer: Self-pay | Admitting: Internal Medicine

## 2019-03-30 ENCOUNTER — Other Ambulatory Visit: Payer: Self-pay

## 2019-03-30 DIAGNOSIS — J449 Chronic obstructive pulmonary disease, unspecified: Secondary | ICD-10-CM | POA: Diagnosis not present

## 2019-03-30 DIAGNOSIS — Z87891 Personal history of nicotine dependence: Secondary | ICD-10-CM | POA: Insufficient documentation

## 2019-03-30 DIAGNOSIS — Z79899 Other long term (current) drug therapy: Secondary | ICD-10-CM | POA: Diagnosis not present

## 2019-03-30 DIAGNOSIS — J411 Mucopurulent chronic bronchitis: Secondary | ICD-10-CM | POA: Diagnosis not present

## 2019-03-30 DIAGNOSIS — Z20828 Contact with and (suspected) exposure to other viral communicable diseases: Secondary | ICD-10-CM | POA: Diagnosis not present

## 2019-03-30 DIAGNOSIS — R112 Nausea with vomiting, unspecified: Secondary | ICD-10-CM | POA: Diagnosis present

## 2019-03-30 DIAGNOSIS — N183 Chronic kidney disease, stage 3 unspecified: Secondary | ICD-10-CM | POA: Diagnosis not present

## 2019-03-30 DIAGNOSIS — I2510 Atherosclerotic heart disease of native coronary artery without angina pectoris: Secondary | ICD-10-CM | POA: Insufficient documentation

## 2019-03-30 DIAGNOSIS — E860 Dehydration: Secondary | ICD-10-CM | POA: Insufficient documentation

## 2019-03-30 DIAGNOSIS — E039 Hypothyroidism, unspecified: Secondary | ICD-10-CM | POA: Insufficient documentation

## 2019-03-30 DIAGNOSIS — R531 Weakness: Secondary | ICD-10-CM | POA: Diagnosis not present

## 2019-03-30 LAB — BASIC METABOLIC PANEL
Anion gap: 12 (ref 5–15)
BUN: 19 mg/dL (ref 8–23)
CO2: 24 mmol/L (ref 22–32)
Calcium: 8.7 mg/dL — ABNORMAL LOW (ref 8.9–10.3)
Chloride: 101 mmol/L (ref 98–111)
Creatinine, Ser: 0.96 mg/dL (ref 0.44–1.00)
GFR calc Af Amer: >60 mL/min (ref >60)
GFR calc non Af Amer: 58 mL/min — ABNORMAL LOW (ref >60)
Glucose, Bld: 130 mg/dL — ABNORMAL HIGH (ref 70–99)
Potassium: 3.5 mmol/L (ref 3.5–5.1)
Sodium: 137 mmol/L (ref 135–145)

## 2019-03-30 LAB — CBC
HCT: 41.3 % (ref 36.0–46.0)
Hemoglobin: 13.8 g/dL (ref 12.0–15.0)
MCH: 30.5 pg (ref 26.0–34.0)
MCHC: 33.4 g/dL (ref 30.0–36.0)
MCV: 91.4 fL (ref 80.0–100.0)
Platelets: 205 10*3/uL (ref 150–400)
RBC: 4.52 MIL/uL (ref 3.87–5.11)
RDW: 12.5 % (ref 11.5–15.5)
WBC: 3.9 10*3/uL — ABNORMAL LOW (ref 4.0–10.5)
nRBC: 0 % (ref 0.0–0.2)

## 2019-03-30 MED ORDER — ONDANSETRON HCL 4 MG/2ML IJ SOLN
4.0000 mg | Freq: Once | INTRAMUSCULAR | Status: AC
  Administered 2019-03-30: 4 mg via INTRAVENOUS
  Filled 2019-03-30: qty 2

## 2019-03-30 MED ORDER — SODIUM CHLORIDE 0.9 % IV BOLUS: 1000.0000 mL | Freq: Once | INTRAVENOUS | Status: DC

## 2019-03-30 NOTE — ED Notes (Signed)
Patient placed on 2L O2 in triage

## 2019-03-30 NOTE — ED Triage Notes (Addendum)
Patient c/o weakness, headache, abd pain with N/V/D X 1 1/2 weeks. Patient wears 2L O2 at home as needed. O2 sats in triage 88% room air.

## 2019-03-30 NOTE — Telephone Encounter (Signed)
agreed

## 2019-03-30 NOTE — Telephone Encounter (Signed)
Pt stated that she was advised by DK to contact our office if she develops any sx. Pt reports of headache, stomach ache and occ diarrhea. Pt denied congestion, sob, wheezing, fever, chills or sweats.  I have recommended that contact PCP regarding these sx, as she is not having respiratory symptoms.  Will route to DK just as an [place].

## 2019-03-31 LAB — CBC WITH DIFFERENTIAL/PLATELET
Abs Immature Granulocytes: 0.02 10*3/uL (ref 0.00–0.07)
Basophils Absolute: 0 10*3/uL (ref 0.0–0.1)
Basophils Relative: 0 %
Eosinophils Absolute: 0 10*3/uL (ref 0.0–0.5)
Eosinophils Relative: 0 %
HCT: 39.6 % (ref 36.0–46.0)
Hemoglobin: 13.9 g/dL (ref 12.0–15.0)
Immature Granulocytes: 1 %
Lymphocytes Relative: 17 %
Lymphs Abs: 0.7 10*3/uL (ref 0.7–4.0)
MCH: 32.7 pg (ref 26.0–34.0)
MCHC: 35.1 g/dL (ref 30.0–36.0)
MCV: 93.2 fL (ref 80.0–100.0)
Monocytes Absolute: 0.4 10*3/uL (ref 0.1–1.0)
Monocytes Relative: 10 %
Neutro Abs: 2.9 10*3/uL (ref 1.7–7.7)
Neutrophils Relative %: 72 %
Platelets: 202 10*3/uL (ref 150–400)
RBC: 4.25 MIL/uL (ref 3.87–5.11)
RDW: 12.5 % (ref 11.5–15.5)
WBC: 4 10*3/uL (ref 4.0–10.5)
nRBC: 0 % (ref 0.0–0.2)

## 2019-03-31 LAB — HEPATIC FUNCTION PANEL
ALT: 38 U/L (ref 0–44)
AST: 39 U/L (ref 15–41)
Albumin: 3.5 g/dL (ref 3.5–5.0)
Alkaline Phosphatase: 115 U/L (ref 38–126)
Bilirubin, Direct: 0.1 mg/dL (ref 0.0–0.2)
Indirect Bilirubin: 0.5 mg/dL (ref 0.3–0.9)
Total Bilirubin: 0.6 mg/dL (ref 0.3–1.2)
Total Protein: 7.1 g/dL (ref 6.5–8.1)

## 2019-03-31 LAB — URINALYSIS, COMPLETE (UACMP) WITH MICROSCOPIC
Bacteria, UA: NONE SEEN
Bilirubin Urine: NEGATIVE
Glucose, UA: NEGATIVE mg/dL
Hgb urine dipstick: NEGATIVE
Ketones, ur: 20 mg/dL — AB
Nitrite: NEGATIVE
Protein, ur: 100 mg/dL — AB
Specific Gravity, Urine: 1.028 (ref 1.005–1.030)
pH: 5 (ref 5.0–8.0)

## 2019-03-31 LAB — LIPASE, BLOOD: Lipase: 28 U/L (ref 11–51)

## 2019-03-31 MED ORDER — ONDANSETRON 4 MG PO TBDP: 4.0000 mg | ORAL_TABLET | Freq: Three times a day (TID) | ORAL | 0 refills | Status: DC | PRN

## 2019-03-31 MED ORDER — ACETAMINOPHEN 500 MG PO TABS
1000.0000 mg | ORAL_TABLET | Freq: Once | ORAL | Status: AC
  Administered 2019-03-31: 1000 mg via ORAL
  Filled 2019-03-31: qty 2

## 2019-03-31 NOTE — ED Provider Notes (Signed)
[facility] Emergency Department Provider Note  ____________________________________________  Time seen: Approximately 1:17 AM  I have reviewed the triage vital signs and the nursing notes.   HISTORY  Chief Complaint Weakness, Headache, and Abdominal Pain   HPI Stacey Durham is a 73 y.o. female with a history of COPD on 3 L nasal cannula, chronic kidney disease, CAD who presents for evaluation of dehydration.  Patient reports that for the last few days she has had nausea, fatigue, has been sleeping more and not eating or drinking as much.  She has had decreased appetite.  Vomited twice, NBNB.  She does have a history of chronic diarrhea and that is unchanged.  No changes in her chronic cough or shortness of breath, no need for more oxygen, no chest pain, no abdominal pain, no fever chills, no sore throat, no loss of taste or smell, no dysuria or hematuria.  Her husband was worried that she was becoming dehydrated.  He reports that she spent most of the day in bed today.  He was also concerned about Covid although he reports no exposures, they have been using masks and staying at home as much as he can. She is complaining of a mild diffuse HA that she has had all day. No fall or head trauma.     Past Medical History:  Diagnosis Date   Acute cholecystitis    Cholecystitis with cholelithiasis 10/11/2015   COPD (chronic obstructive pulmonary disease) (HCC)    Ex-smoker    quit 2000s   Pneumonia 11/26/2015    Patient Active Problem List   Diagnosis Date Noted   Upper back pain on right side 03/03/2019   CKD (chronic kidney disease) stage 3, GFR 30-59 ml/min 07/28/2018   Chronic diarrhea 05/10/2018   Chronic respiratory failure with hypoxia (HCC) 01/29/2018   Osteopenia 09/25/2017   Pulmonary nodule 02/02/2017   Neck pain 01/30/2017   Health maintenance examination 06/26/2016   Advanced care planning/counseling discussion 06/26/2016    Incontinence of feces with fecal urgency 06/26/2016   HLD (hyperlipidemia) 06/26/2016   Prediabetes 06/26/2016   Infected epidermoid cyst 01/23/2016   Hair loss 01/07/2016   Subclinical hypothyroidism 01/07/2016   Aortic atherosclerosis (HCC) 01/07/2016   CAD (coronary artery disease) 01/07/2016   COPD exacerbation (HCC) 11/26/2015   Ex-smoker 09/25/2015   Chronic obstructive pulmonary disease (HCC) 08/16/2015    Past Surgical History:  Procedure Laterality Date   ABDOMINAL HYSTERECTOMY  1978   part of an ovary remained   APPENDECTOMY  1978   CATARACT EXTRACTION, BILATERAL  2013   CHOLECYSTECTOMY N/A 10/11/2015   Procedure: LAPAROSCOPIC CHOLECYSTECTOMY;  Surgeon: Leafy Ro, MD;  Location: [facility] ORS;  Service: General;  Laterality: N/A;   COLONOSCOPY  07/2016   HP, ext hem, diverticulosis, rpt 5 yrs (Outlaw)   TONSILLECTOMY      Prior to Admission medications   Medication Sig Start Date End Date Taking? Authorizing Provider  albuterol (PROVENTIL) (2.5 MG/3ML) 0.083% nebulizer solution Take 3 mLs (2.5 mg total) by nebulization every 4 (four) hours as needed for wheezing or shortness of breath. 06/29/18   Eustaquio Boyden, MD  albuterol (VENTOLIN HFA) 108 (90 Base) MCG/ACT inhaler INHALE 2 PUFFS INTO THE LUNGS EVERY 4 (FOUR) HOURS AS NEEDED FOR WHEEZING OR SHORTNESS OF BREATH. 10/13/18   Erin Fulling, MD  atorvastatin (LIPITOR) 20 MG tablet Take 1 tablet (20 mg total) by mouth daily. 07/28/18   Eustaquio Boyden, MD  Biotin 40981 MCG TABS Take  1 tablet by mouth.    [provider]  calcium-vitamin D (OSCAL WITH D) 500-200 MG-UNIT tablet Take 1 tablet by mouth.    [provider]  Dextromethorphan-Guaifenesin (MUCINEX DM PO) Take 1 tablet by mouth daily.     [provider]  fluticasone (FLONASE) 50 MCG/ACT nasal spray SPRAY 2 SPRAYS INTO EACH NOSTRIL EVERY DAY 02/17/19   Ria Bush, MD  Fluticasone-Umeclidin-Vilant (TRELEGY ELLIPTA)  100-62.5-25 MCG/INH AEPB Inhale 1 Act into the lungs every morning. 11/15/18   Flora Lipps, MD  Fluticasone-Umeclidin-Vilant (TRELEGY ELLIPTA) 100-62.5-25 MCG/INH AEPB Inhale 1 puff into the lungs daily. 03/16/19   Flora Lipps, MD  ipratropium-albuterol (DUONEB) 0.5-2.5 (3) MG/3ML SOLN Take 3 mLs by nebulization every 4 (four) hours as needed. 08/08/18   Flora Lipps, MD  loperamide (IMODIUM A-D) 2 MG tablet Take 1 tablet (2 mg total) by mouth daily as needed for diarrhea or loose stools. 07/28/18   Ria Bush, MD  montelukast (SINGULAIR) 10 MG tablet TAKE 1 TABLET BY MOUTH EVERY DAY 12/21/18   Ria Bush, MD  Multiple Vitamins-Minerals (MULTIVITAMIN ADULT PO) Take 1 tablet by mouth.    [provider]  ondansetron (ZOFRAN ODT) 4 MG disintegrating tablet Take 1 tablet (4 mg total) by mouth every 8 (eight) hours as needed. 03/31/19   Rudene Re, MD  Probiotic Product (PROBIOTIC-10 PO) Take 1 tablet by mouth.    [provider]  Respiratory Therapy Supplies (FLUTTER) DEVI 1 each by Does not apply route once. 11/26/15   Vilinda Boehringer, MD  vitamin B-12 (CYANOCOBALAMIN) 1000 MCG tablet Take 1,000 mcg by mouth daily.    [provider]    Allergies Patient has no known allergies.  Family History  Problem Relation Age of Onset   Heart attack Father 22   Diabetes Father    Diabetes Mother    Stroke Neg Hx    Cancer Neg Hx     Social History Social History   Tobacco Use   Smoking status: Former Smoker    Packs/day: 1.00    Years: 40.00    Pack years: 40.00    Types: Cigarettes    Quit date: 2003    Years since quitting: 17.8   Smokeless tobacco: Never Used  Substance Use Topics   Alcohol use: No   Drug use: No    Review of Systems  Constitutional: Negative for fever. + fatigue Eyes: Negative for visual changes. ENT: Negative for sore throat. Neck: No neck pain  Cardiovascular: Negative for chest pain. Respiratory: Negative  for shortness of breath. Gastrointestinal: Negative for abdominal pain. + vomiting, diarrhea, decreased appetite Genitourinary: Negative for dysuria. Musculoskeletal: Negative for back pain. Skin: Negative for rash. Neurological: Negative for  weakness or numbness. + HA Psych: No SI or HI  ____________________________________________   PHYSICAL EXAM:  VITAL SIGNS: ED Triage Vitals  Enc Vitals Group     BP 03/30/19 1819 138/81     Pulse Rate 03/30/19 1819 98     Resp 03/30/19 1819 14     Temp 03/30/19 1819 98.3 F (36.8 C)     Temp Source 03/30/19 1819 Oral     SpO2 03/30/19 1819 (!) 88 %     Weight 03/30/19 1816 150 lb (68 kg)     Height 03/30/19 1816 5\' 5"  (1.651 m)     Head Circumference --      Peak Flow --      Pain Score 03/30/19 1816 7     Pain  Loc --      Pain Edu? --      Excl. in GC? --     Constitutional: Alert and oriented. Well appearing and in no apparent distress. HEENT:      Head: Normocephalic and atraumatic.         Eyes: Conjunctivae are normal. Sclera is non-icteric.       Mouth/Throat: Mucous membranes are moist.       Neck: Supple with no signs of meningismus. Cardiovascular: Regular rate and rhythm. No murmurs, gallops, or rubs. 2+ symmetrical distal pulses are present in all extremities. No JVD. Respiratory: Normal respiratory effort. Lungs are clear to auscultation bilaterally. No wheezes, crackles, or rhonchi.  Gastrointestinal: Soft, non tender, and non distended with positive bowel sounds. No rebound or guarding. Musculoskeletal: Nontender with normal range of motion in all extremities. No edema, cyanosis, or erythema of extremities. Neurologic: Normal speech and language. Face is symmetric. Moving all extremities. No gross focal neurologic deficits are appreciated. Skin: Skin is warm, dry and intact. No rash noted. Psychiatric: Mood and affect are normal. Speech and behavior are normal.  ____________________________________________    LABS (all labs ordered are listed, but only abnormal results are displayed)  Labs Reviewed  BASIC METABOLIC PANEL - Abnormal; Notable for the following components:      Result Value   Glucose, Bld 130 (*)    Calcium 8.7 (*)    GFR calc non Af Amer 58 (*)    All other components within normal limits  CBC - Abnormal; Notable for the following components:   WBC 3.9 (*)    All other components within normal limits  URINALYSIS, COMPLETE (UACMP) WITH MICROSCOPIC - Abnormal; Notable for the following components:   Color, Urine AMBER (*)    APPearance CLOUDY (*)    Ketones, ur 20 (*)    Protein, ur 100 (*)    Leukocytes,Ua TRACE (*)    All other components within normal limits  URINE CULTURE  HEPATIC FUNCTION PANEL  LIPASE, BLOOD  CBC WITH DIFFERENTIAL/PLATELET   ____________________________________________  EKG  ED ECG REPORT I, Nita Sickle, the attending physician, personally viewed and interpreted this ECG.  Normal sinus rhythm, rate of 93, normal intervals, normal axis, no ST elevations or depressions. ____________________________________________  RADIOLOGY  none  ____________________________________________   PROCEDURES  Procedure(s) performed: None Procedures Critical Care performed:  None ____________________________________________   INITIAL IMPRESSION / ASSESSMENT AND PLAN / ED COURSE   73 y.o. female with a history of COPD on 3 L nasal cannula, chronic kidney disease, CAD who presents for evaluation of dehydration.  Patient with 2 days of fatigue, decreased oral intake, 2 episodes of vomiting, chronic diarrhea.  Looks well appearing but dry on exam, normal vital signs, abdomen is soft with no distention and no tenderness.  EKG with no ischemic changes.  Labs consistent with mild dehydration with 20 ketones in her urine.  No signs of UTI.  Patient with borderline low white count but normal differential.  Discussed these findings with her and recommended  close follow-up with PCP for further evaluation.  Remain cell lines are within normal limits.  Presentation most likely viral illness.  Patient received a liter of fluids and Zofran and felt markedly improved, tolerating p.o. with no further episodes of nausea and vomiting.  Discussed increase oral hydration and Zofran as needed for nausea.  Discussed my standard return precautions and follow-up with PCP.       As part of my medical decision  making, I reviewed the following data within the electronic MEDICAL RECORD NUMBER History obtained from family, Nursing notes reviewed and incorporated, Labs reviewed , EKG interpreted , Old chart reviewed, Notes from prior ED visits and [place] Controlled Substance Database   Patient was evaluated in Emergency Department today for the symptoms described in the history of present illness. Patient was evaluated in the context of the global COVID-19 pandemic, which necessitated consideration that the patient might be at risk for infection with the SARS-CoV-2 virus that causes COVID-19. Institutional protocols and algorithms that pertain to the evaluation of patients at risk for COVID-19 are in a state of rapid change based on information released by regulatory bodies including the CDC and federal and state organizations. These policies and algorithms were followed during the patient's care in the ED.   ____________________________________________   FINAL CLINICAL IMPRESSION(S) / ED DIAGNOSES   Final diagnoses:  Non-intractable vomiting with nausea, unspecified vomiting type  Dehydration      NEW MEDICATIONS STARTED DURING THIS VISIT:  ED Discharge Orders         Ordered    ondansetron (ZOFRAN ODT) 4 MG disintegrating tablet  Every 8 hours PRN     03/31/19 0122           Note:  This document was prepared using Dragon voice recognition software and may include unintentional dictation errors.    Nita SickleVeronese, [place], MD 03/31/19 [phone]

## 2019-04-01 LAB — URINE CULTURE

## 2019-04-03 ENCOUNTER — Emergency Department
Admission: EM | Admit: 2019-04-03 | Discharge: 2019-04-03 | Disposition: A | Discharge status: Home / Self Care | Payer: Medicare HMO | Attending: Student | Admitting: Student

## 2019-04-03 ENCOUNTER — Emergency Department: Payer: Medicare HMO

## 2019-04-03 ENCOUNTER — Encounter: Payer: Self-pay | Admitting: Emergency Medicine

## 2019-04-03 ENCOUNTER — Other Ambulatory Visit: Payer: Self-pay

## 2019-04-03 ENCOUNTER — Ambulatory Visit (INDEPENDENT_AMBULATORY_CARE_PROVIDER_SITE_OTHER): Payer: Medicare HMO | Admitting: Family Medicine

## 2019-04-03 ENCOUNTER — Encounter: Payer: Self-pay | Admitting: Family Medicine

## 2019-04-03 VITALS — BP 106/62 | HR 82 | Temp 98.5°F | Resp 22 | Ht 65.0 in | Wt 147.2 lb

## 2019-04-03 DIAGNOSIS — R0602 Shortness of breath: Secondary | ICD-10-CM | POA: Diagnosis not present

## 2019-04-03 DIAGNOSIS — I129 Hypertensive chronic kidney disease with stage 1 through stage 4 chronic kidney disease, or unspecified chronic kidney disease: Secondary | ICD-10-CM | POA: Insufficient documentation

## 2019-04-03 DIAGNOSIS — U071 COVID-19: Secondary | ICD-10-CM | POA: Diagnosis not present

## 2019-04-03 DIAGNOSIS — Z87891 Personal history of nicotine dependence: Secondary | ICD-10-CM | POA: Insufficient documentation

## 2019-04-03 DIAGNOSIS — R0902 Hypoxemia: Secondary | ICD-10-CM

## 2019-04-03 DIAGNOSIS — J449 Chronic obstructive pulmonary disease, unspecified: Secondary | ICD-10-CM | POA: Diagnosis not present

## 2019-04-03 DIAGNOSIS — E860 Dehydration: Secondary | ICD-10-CM | POA: Diagnosis not present

## 2019-04-03 DIAGNOSIS — R062 Wheezing: Secondary | ICD-10-CM

## 2019-04-03 DIAGNOSIS — Z79899 Other long term (current) drug therapy: Secondary | ICD-10-CM | POA: Diagnosis not present

## 2019-04-03 DIAGNOSIS — E02 Subclinical iodine-deficiency hypothyroidism: Secondary | ICD-10-CM | POA: Insufficient documentation

## 2019-04-03 DIAGNOSIS — Z5320 Procedure and treatment not carried out because of patient's decision for unspecified reasons: Secondary | ICD-10-CM | POA: Diagnosis not present

## 2019-04-03 DIAGNOSIS — R531 Weakness: Secondary | ICD-10-CM | POA: Diagnosis present

## 2019-04-03 DIAGNOSIS — I2510 Atherosclerotic heart disease of native coronary artery without angina pectoris: Secondary | ICD-10-CM | POA: Diagnosis not present

## 2019-04-03 DIAGNOSIS — N183 Chronic kidney disease, stage 3 unspecified: Secondary | ICD-10-CM | POA: Diagnosis not present

## 2019-04-03 LAB — SARS CORONAVIRUS 2 BY RT PCR (HOSPITAL ORDER, PERFORMED IN ~~LOC~~ HOSPITAL LAB): SARS Coronavirus 2: POSITIVE — AB

## 2019-04-03 LAB — BRAIN NATRIURETIC PEPTIDE: B Natriuretic Peptide: 41 pg/mL (ref 0.0–100.0)

## 2019-04-03 LAB — COMPREHENSIVE METABOLIC PANEL
ALT: 28 U/L (ref 0–44)
AST: 48 U/L — ABNORMAL HIGH (ref 15–41)
Albumin: 3.3 g/dL — ABNORMAL LOW (ref 3.5–5.0)
Alkaline Phosphatase: 85 U/L (ref 38–126)
Anion gap: 13 (ref 5–15)
BUN: 16 mg/dL (ref 8–23)
CO2: 24 mmol/L (ref 22–32)
Calcium: 8.4 mg/dL — ABNORMAL LOW (ref 8.9–10.3)
Chloride: 99 mmol/L (ref 98–111)
Creatinine, Ser: 0.82 mg/dL (ref 0.44–1.00)
GFR calc Af Amer: >60 mL/min (ref >60)
GFR calc non Af Amer: >60 mL/min (ref >60)
Glucose, Bld: 117 mg/dL — ABNORMAL HIGH (ref 70–99)
Potassium: 3.8 mmol/L (ref 3.5–5.1)
Sodium: 136 mmol/L (ref 135–145)
Total Bilirubin: 1.5 mg/dL — ABNORMAL HIGH (ref 0.3–1.2)
Total Protein: 7.1 g/dL (ref 6.5–8.1)

## 2019-04-03 LAB — CBC
HCT: 37.5 % (ref 36.0–46.0)
Hemoglobin: 13 g/dL (ref 12.0–15.0)
MCH: 30.8 pg (ref 26.0–34.0)
MCHC: 34.7 g/dL (ref 30.0–36.0)
MCV: 88.9 fL (ref 80.0–100.0)
Platelets: 271 10*3/uL (ref 150–400)
RBC: 4.22 MIL/uL (ref 3.87–5.11)
RDW: 12 % (ref 11.5–15.5)
WBC: 5.1 10*3/uL (ref 4.0–10.5)
nRBC: 0 % (ref 0.0–0.2)

## 2019-04-03 LAB — URINALYSIS, ROUTINE W REFLEX MICROSCOPIC
Bilirubin Urine: NEGATIVE
Glucose, UA: NEGATIVE mg/dL
Hgb urine dipstick: NEGATIVE
Ketones, ur: 20 mg/dL — AB
Leukocytes,Ua: NEGATIVE
Nitrite: NEGATIVE
Protein, ur: NEGATIVE mg/dL
Specific Gravity, Urine: 1.009 (ref 1.005–1.030)
pH: 6 (ref 5.0–8.0)

## 2019-04-03 LAB — LIPASE, BLOOD: Lipase: 28 U/L (ref 11–51)

## 2019-04-03 LAB — TROPONIN I (HIGH SENSITIVITY): Troponin I (High Sensitivity): 4 ng/L (ref <18)

## 2019-04-03 MED ORDER — PREDNISONE 10 MG PO TABS: 50.0000 mg | ORAL_TABLET | Freq: Every day | ORAL | 0 refills | Status: DC

## 2019-04-03 MED ORDER — AZITHROMYCIN 250 MG PO TABS: ORAL_TABLET | ORAL | 0 refills | Status: DC

## 2019-04-03 MED ORDER — SODIUM CHLORIDE 0.9 % IV BOLUS
1000.0000 mL | Freq: Once | INTRAVENOUS | Status: AC
  Administered 2019-04-03: 1000 mL via INTRAVENOUS

## 2019-04-03 NOTE — ED Notes (Signed)
O2 was increased to 3.5-4 L. Patient's pulse ox increased to 91-92%. Patient is aware of need to increase O2 at home. Patient did not bring her home O2 and refused to stay to wait for husband to go home and get it. Patient kept on the hospital tank for discharge. Patient is aware that she is leaving AMA.

## 2019-04-03 NOTE — ED Provider Notes (Signed)
Apolonio Schneiders, attending physician, personally viewed and interpreted this EKG  EKG Time: 1801 Rate: 72 Rhythm: normal sinus rhythm Axis: normal Intervals: qtc 468 QRS: narrow ST changes: no st elevation, t wave inversion v1-v3 Impression: abnormal Val Eagle, MD 04/03/19 1816

## 2019-04-03 NOTE — ED Provider Notes (Signed)
[facility] Emergency Department Provider Note ____________________________________________   First MD Initiated Contact with Patient 04/03/19 1704     (approximate)  I have reviewed the triage vital signs and the nursing notes.   HISTORY  Chief Complaint Dehydration  HPI Stacey Durham is a 73 y.o. female who presents to the emergency department for treatment and evaluation of weakness and general malaise with fatigue. She was evaluated by her PCP today and sent to the ER. Oxygen saturation was 87% on 2 liters of oxygen that she typically only requires at night. She has a longstanding history of COPD, CKD, and chronic respiratory failure with hypoxia. She states that she was doing well on Trelegy, but since she started feeling so poorly 3 days ago, she hadn't taken it until last night.   She denies pain, fever, nausea, vomiting. No change in chronic diarrhea.    Past Medical History:  Diagnosis Date  . Acute cholecystitis   . Cholecystitis with cholelithiasis 10/11/2015  . COPD (chronic obstructive pulmonary disease) (HCC)   . Ex-smoker    quit 2000s  . Pneumonia 11/26/2015    Patient Active Problem List   Diagnosis Date Noted  . Upper back pain on right side 03/03/2019  . CKD (chronic kidney disease) stage 3, GFR 30-59 ml/min 07/28/2018  . Chronic diarrhea 05/10/2018  . Chronic respiratory failure with hypoxia (HCC) 01/29/2018  . Osteopenia 09/25/2017  . Pulmonary nodule 02/02/2017  . Neck pain 01/30/2017  . Health maintenance examination 06/26/2016  . Advanced care planning/counseling discussion 06/26/2016  . Incontinence of feces with fecal urgency 06/26/2016  . HLD (hyperlipidemia) 06/26/2016  . Prediabetes 06/26/2016  . Infected epidermoid cyst 01/23/2016  . Hair loss 01/07/2016  . Subclinical hypothyroidism 01/07/2016  . Aortic atherosclerosis (HCC) 01/07/2016  . CAD (coronary artery disease) 01/07/2016  . COPD exacerbation (HCC)  11/26/2015  . Ex-smoker 09/25/2015  . Chronic obstructive pulmonary disease (HCC) 08/16/2015    Past Surgical History:  Procedure Laterality Date  . ABDOMINAL HYSTERECTOMY  1978   part of an ovary remained  . APPENDECTOMY  1978  . CATARACT EXTRACTION, BILATERAL  2013  . CHOLECYSTECTOMY N/A 10/11/2015   Procedure: LAPAROSCOPIC CHOLECYSTECTOMY;  Surgeon: Leafy Ro, MD;  Location: [facility] ORS;  Service: General;  Laterality: N/A;  . COLONOSCOPY  07/2016   HP, ext hem, diverticulosis, rpt 5 yrs (Outlaw)  . TONSILLECTOMY      Prior to Admission medications   Medication Sig Start Date End Date Taking? Authorizing Provider  albuterol (PROVENTIL) (2.5 MG/3ML) 0.083% nebulizer solution Take 3 mLs (2.5 mg total) by nebulization every 4 (four) hours as needed for wheezing or shortness of breath. Patient not taking: Reported on 04/03/2019 06/29/18   Eustaquio Boyden, MD  albuterol (VENTOLIN HFA) 108 (90 Base) MCG/ACT inhaler INHALE 2 PUFFS INTO THE LUNGS EVERY 4 (FOUR) HOURS AS NEEDED FOR WHEEZING OR SHORTNESS OF BREATH. Patient not taking: Reported on 04/03/2019 10/13/18   Erin Fulling, MD  atorvastatin (LIPITOR) 20 MG tablet Take 1 tablet (20 mg total) by mouth daily. Patient not taking: Reported on 04/03/2019 07/28/18   Eustaquio Boyden, MD  azithromycin (ZITHROMAX) 250 MG tablet 2 tablets today, then 1 tablet for the next 4 days. 04/03/19   Temperence Zenor, Rulon Eisenmenger B, FNP  Biotin 16109 MCG TABS Take 1 tablet by mouth.    [provider]  calcium-vitamin D (OSCAL WITH D) 500-200 MG-UNIT tablet Take 1 tablet by mouth.    [provider]  Dextromethorphan-Guaifenesin (  MUCINEX DM PO) Take 1 tablet by mouth daily.     [provider]  fluticasone (FLONASE) 50 MCG/ACT nasal spray SPRAY 2 SPRAYS INTO EACH NOSTRIL EVERY DAY Patient not taking: Reported on 04/03/2019 02/17/19   Eustaquio BoydenGutierrez, Javier, MD  Fluticasone-Umeclidin-Vilant (TRELEGY ELLIPTA) 100-62.5-25 MCG/INH AEPB Inhale 1 Act into  the lungs every morning. 11/15/18   Erin FullingKasa, Kurian, MD  ipratropium-albuterol (DUONEB) 0.5-2.5 (3) MG/3ML SOLN Take 3 mLs by nebulization every 4 (four) hours as needed. Patient not taking: Reported on 04/03/2019 08/08/18   Erin FullingKasa, Kurian, MD  loperamide (IMODIUM A-D) 2 MG tablet Take 1 tablet (2 mg total) by mouth daily as needed for diarrhea or loose stools. Patient not taking: Reported on 04/03/2019 07/28/18   Eustaquio BoydenGutierrez, Javier, MD  montelukast (SINGULAIR) 10 MG tablet TAKE 1 TABLET BY MOUTH EVERY DAY Patient not taking: Reported on 04/03/2019 12/21/18   Eustaquio BoydenGutierrez, Javier, MD  Multiple Vitamins-Minerals (MULTIVITAMIN ADULT PO) Take 1 tablet by mouth.    [provider]  predniSONE (DELTASONE) 10 MG tablet Take 5 tablets (50 mg total) by mouth daily. 04/03/19   Steen Bisig, Rulon Eisenmengerari B, FNP  Probiotic Product (PROBIOTIC-10 PO) Take 1 tablet by mouth.    [provider]  Respiratory Therapy Supplies (FLUTTER) DEVI 1 each by Does not apply route once. Patient not taking: Reported on 04/03/2019 11/26/15   Stephanie AcreMungal, Vishal, MD  vitamin B-12 (CYANOCOBALAMIN) 1000 MCG tablet Take 1,000 mcg by mouth daily.    [provider]    Allergies Patient has no known allergies.  Family History  Problem Relation Age of Onset  . Heart attack Father 5072  . Diabetes Father   . Diabetes Mother   . Stroke Neg Hx   . Cancer Neg Hx     Social History Social History   Tobacco Use  . Smoking status: Former Smoker    Packs/day: 1.00    Years: 40.00    Pack years: 40.00    Types: Cigarettes    Quit date: 2003    Years since quitting: 17.8  . Smokeless tobacco: Never Used  Substance Use Topics  . Alcohol use: No  . Drug use: No    Review of Systems  Constitutional: No fever/chills. Decreased energy level, decreased appetite. Eyes: No visual changes. ENT: No sore throat. Cardiovascular: Denies chest pain. Respiratory: Positive for shortness of breath. Gastrointestinal: No abdominal pain.  No  nausea, no vomiting.  No diarrhea.  No constipation. Genitourinary: Negative for dysuria. Musculoskeletal: Negative for back pain. Skin: Negative for rash. Neurological: Negative for headaches, focal weakness or numbness. ____________________________________________   PHYSICAL EXAM:  VITAL SIGNS: ED Triage Vitals  Enc Vitals Group     BP 04/03/19 1624 (!) 136/98     Pulse Rate 04/03/19 1624 73     Resp 04/03/19 1624 (!) 22     Temp 04/03/19 1624 99 F (37.2 C)     Temp Source 04/03/19 1624 Oral     SpO2 04/03/19 1624 92 %     Weight 04/03/19 1626 147 lb (66.7 kg)     Height 04/03/19 1626 5\' 5"  (1.651 m)     Head Circumference --      Peak Flow --      Pain Score 04/03/19 1626 0     Pain Loc --      Pain Edu? --      Excl. in GC? --     Constitutional: Alert and oriented. Well appearing and in no acute distress. Eyes:  Conjunctivae are normal. PERRL. EOMI. Head: Atraumatic. Nose: No congestion/rhinnorhea. Mouth/Throat: Mucous membranes are moist.  Oropharynx non-erythematous. Neck: No stridor.   Hematological/Lymphatic/Immunilogical: No cervical lymphadenopathy. Cardiovascular: Normal rate, regular rhythm. Grossly normal heart sounds.  Good peripheral circulation. Respiratory: Normal respiratory effort.  No retractions. Lungs diminished in bases bilaterally. Gastrointestinal: Soft and nontender. No distention. No abdominal bruits. No CVA tenderness. Genitourinary:  Musculoskeletal: No lower extremity tenderness nor edema.  No joint effusions. Neurologic:  Normal speech and language. No gross focal neurologic deficits are appreciated. No gait instability. Skin:  Skin is warm, dry and intact. No rash noted. Psychiatric: Mood and affect are normal. Speech and behavior are normal.  ____________________________________________   LABS (all labs ordered are listed, but only abnormal results are displayed)  Labs Reviewed  SARS CORONAVIRUS 2 BY RT PCR (HOSPITAL ORDER,  [facility]) - Abnormal; Notable for the following components:      Result Value   SARS Coronavirus 2 POSITIVE (*)    All other components within normal limits  COMPREHENSIVE METABOLIC PANEL - Abnormal; Notable for the following components:   Glucose, Bld 117 (*)    Calcium 8.4 (*)    Albumin 3.3 (*)    AST 48 (*)    Total Bilirubin 1.5 (*)    All other components within normal limits  URINALYSIS, ROUTINE W REFLEX MICROSCOPIC - Abnormal; Notable for the following components:   Color, Urine YELLOW (*)    APPearance CLEAR (*)    Ketones, ur 20 (*)    All other components within normal limits  CBC  LIPASE, BLOOD  BRAIN NATRIURETIC PEPTIDE  TROPONIN I (HIGH SENSITIVITY)  TROPONIN I (HIGH SENSITIVITY)   ____________________________________________  EKG  ED ECG REPORT I, Kanton Kamel, FNP-BC personally viewed and interpreted this ECG.   Date: 04/03/2019  EKG Time: 1801  Rate: 72  Rhythm: normal EKG, normal sinus rhythm  Axis: normal  Intervals:none  ST&T Change: no ST elevation  ____________________________________________  RADIOLOGY  ED MD interpretation:    Bilateral interstitial thickening with trace bilateral pleural effusions that are concerning for mild interstitial edema.  Focal high hazy right perihilar airspace disease which may reflect atelectasis versus pneumonia.  Official radiology report(s): Dg Chest Portable 1 View  Result Date: 04/03/2019 CLINICAL DATA:  Weakness, shortness of breath EXAM: PORTABLE CHEST 1 VIEW COMPARISON:  03/03/2019 FINDINGS: Bilateral mild interstitial thickening. Bilateral small pleural effusions. Focal hazy right perihilar airspace disease which may reflect atelectasis versus pneumonia. No pneumothorax. Stable cardiomediastinal silhouette. Thoracic aortic atherosclerosis. No acute osseous abnormality. IMPRESSION: 1. Bilateral interstitial thickening with trace bilateral pleural effusions concerning for mild  interstitial edema. 2. Focal hazy right perihilar airspace disease which may reflect atelectasis versus pneumonia. Electronically Signed   By: Kathreen Devoid   On: 04/03/2019 17:51    ____________________________________________   PROCEDURES  Procedure(s) performed (including Critical Care):  Procedures  ____________________________________________   INITIAL IMPRESSION / ASSESSMENT AND PLAN     73 year old female presenting to the emergency department for treatment and evaluation after being advised by her primary care provider that she was dehydrated and her oxygen level was low.  See HPI for further details.  Plan will be to get some lab studies, chest x-ray, EKG, and COVID-19 test.  While awaiting ER room assignment, IV started and patient is receiving 1 L of normal saline at this time.  DIFFERENTIAL DIAGNOSIS  Dehydration, viral syndrome, COVID-19, cardiac event  ED COURSE  No leukocytosis on CBC, electrolytes  are stable.  LFTs do show that her AST has increased from 39-48 and bilirubin has increased from 0.6-1.5 over the past 4 days.  Initial troponin is reassuring as is lipase.  Chest x-ray shows bilateral interstitial thickening with trace bilateral pleural effusions that are concerning for mild interstitial edema.  There is also focal hazy right perihilar airspace disease which may affect atelectasis versus pneumonia.   Patient is positive for COVID-19. She adamantly refuses to stay.  She states that she feels the test results are positive "only because I am 73 years old.".  She states that she will not accept the diagnosis of COVID-19.   Patient was advised that she may die from this if she is not under the care of healthcare providers.    She requests to be discharged.  She agrees to quarantine until 72 hours after her symptoms have ended.  Her husband was advised to call his primary care provider for recommendation on testing.  Because she already has complicated lung  issues, she will be discharged home with prednisone and azithromycin.  She has inhalers at home and was encouraged to use them as prescribed.  If she has any worsening of her symptoms or becomes concerned, she was highly encouraged to return to the emergency department. ____________________________________________   FINAL CLINICAL IMPRESSION(S) / ED DIAGNOSES  Final diagnoses:  COVID-19  Shortness of breath     ED Discharge Orders         Ordered    predniSONE (DELTASONE) 10 MG tablet  Daily     04/03/19 1951    azithromycin (ZITHROMAX) 250 MG tablet     04/03/19 1951           Note:  This document was prepared using Dragon voice recognition software and may include unintentional dictation errors.   Chinita Pester, FNP 04/03/19 2133    Miguel Aschoff., MD 04/04/19 0000

## 2019-04-03 NOTE — ED Notes (Signed)
See triage note  Sent in to ED from PCP with possible dehydration   Also her o2 sats were low in the office

## 2019-04-03 NOTE — Patient Instructions (Signed)
I am sorry that you are feeling so poorly, I recommend that you go to the ER.   You are very dehydrated and your oxygen levels are low.

## 2019-04-03 NOTE — ED Triage Notes (Signed)
Pt here with c/o feeling dehydrated, was seen here for the same last Thursday. Was given just one bag of fluid and sent home. Pt states she is still very weak and knows getting hydrated would help, has no desire to drink any fluids. Has not been taking her meds as prescribed either. NAD.

## 2019-04-03 NOTE — Progress Notes (Signed)
Subjective:    Patient ID: Stacey Durham, female    DOB: [birth year], 73 y.o.   MRN: [id number]  HPI Chief Complaint  Patient presents with  . ER follow up   This is a 73 yo female, accompanied by her "significant other." She was seen in the ER 03/30/19 with nausea and vomiting. She was given fluids and ondansetron and discharged. She reports that she has no appetite for several weeks. Started with headache and stomachache. Hasn't been taking her oral medications since her nausea started Breathing has been more labored. No productive cough. No loss of taste or smell.     Past Medical History:  Diagnosis Date  . Acute cholecystitis   . Cholecystitis with cholelithiasis 10/11/2015  . COPD (chronic obstructive pulmonary disease) (HCC)   . Ex-smoker    quit 2000s  . Pneumonia 11/26/2015   Past Surgical History:  Procedure Laterality Date  . ABDOMINAL HYSTERECTOMY  1978   part of an ovary remained  . APPENDECTOMY  1978  . CATARACT EXTRACTION, BILATERAL  2013  . CHOLECYSTECTOMY N/A 10/11/2015   Procedure: LAPAROSCOPIC CHOLECYSTECTOMY;  Surgeon: Leafy Ro, MD;  Location: [facility] ORS;  Service: General;  Laterality: N/A;  . COLONOSCOPY  07/2016   HP, ext hem, diverticulosis, rpt 5 yrs (Outlaw)  . TONSILLECTOMY     Family History  Problem Relation Age of Onset  . Heart attack Father 8  . Diabetes Father   . Diabetes Mother   . Stroke Neg Hx   . Cancer Neg Hx    Social History   Tobacco Use  . Smoking status: Former Smoker    Packs/day: 1.00    Years: 40.00    Pack years: 40.00    Types: Cigarettes    Quit date: 2003    Years since quitting: 17.8  . Smokeless tobacco: Never Used  Substance Use Topics  . Alcohol use: No  . Drug use: No      Review of Systems Per HPI    Objective:   Physical Exam Vitals signs reviewed.  Constitutional:      General: She is not in acute distress.    Appearance: She is ill-appearing. She is not toxic-appearing or  diaphoretic.  HENT:     Head: Normocephalic and atraumatic.     Mouth/Throat:     Mouth: Mucous membranes are dry.  Eyes:     Conjunctiva/sclera: Conjunctivae normal.  Cardiovascular:     Rate and Rhythm: Normal rate and regular rhythm.     Heart sounds: Normal heart sounds.     Comments: Increased work of breathing. Able to speak in complete sentences.  Pulmonary:     Effort: Tachypnea present.     Breath sounds: Wheezing (posterior, expiratory) present.  Neurological:     Mental Status: She is alert.       BP 106/62   Pulse 82   Temp 98.5 F (36.9 C)   Resp (!) 22   Ht 5\' 5"  (1.651 m)   Wt 147 lb 4 oz (66.8 kg)   SpO2 (!) 87% Comment: on 2 liters of O2  BMI 24.50 kg/m    RR 24 SpO2 on 4l- 92-93 Assessment & Plan:  1. Dehydration -Patient with very dry mucous membranes and poor skin turgor, she has had very little to drink and eat over the last 5 days -Advised her of my recommendation that she presented to the emergency room -Triage nurse at [facility] regional notified  2. Hypoxia -Improved  pulse ox when O2 increased from 2 L to 4  3. Wheeze -Unclear if underlying infectious process.  She has had recent headaches and should be screened for Covid  -I discussed findings and recommendations with patient and her significant other.  She was unhappy that she needed to return to the emergency room but agreed.   Clarene Reamer, FNP-BC  [facility]  04/03/2019 5:21 PM

## 2019-04-03 NOTE — ED Notes (Signed)
EDP triplett notified of positive COVID result.

## 2019-04-04 ENCOUNTER — Telehealth: Payer: Self-pay | Admitting: Internal Medicine

## 2019-04-04 NOTE — Telephone Encounter (Signed)
Spoke to pt, who stated that she tested positive for covid yesterday.  Pt currently wears 2L at night. Pt is requesting daytime oxygen to help with her covid symptoms.  I have explained to pt that we can not provide daytime oxygen without proving to insurance that daytime oxygen is needed.  Pt also stated that her emergency tanks are empty. I have provided pt with [employer] phone number and advised her to contact them regarding this matter.  I have advised pt to go back to ED if her her symptoms worsen.    Will route to DK as an [place].

## 2019-04-04 NOTE — Telephone Encounter (Signed)
Thank you :)

## 2019-04-05 ENCOUNTER — Telehealth: Payer: Self-pay | Admitting: Family Medicine

## 2019-04-05 NOTE — Telephone Encounter (Signed)
Noted. + covid test in ER, recommended admission but she refused - plz call for update, place on covid list to check on her, and send report to HD.

## 2019-04-05 NOTE — Telephone Encounter (Signed)
Spoke with pt asking how she is doing.  Says she was feeling good yesterday, no sxs.  Today, pt c/o some diarrhea and mild SOB.  Denies any cough or fever.  Says sxs started about 2 wks ago.   Faxing form to [place].

## 2019-04-06 ENCOUNTER — Telehealth: Payer: Self-pay | Admitting: Family Medicine

## 2019-04-06 ENCOUNTER — Telehealth: Payer: Self-pay | Admitting: Internal Medicine

## 2019-04-06 NOTE — Telephone Encounter (Signed)
Pt called and stated that she was diagnosed on 04/03/2019 with Covid-19. Pt was given Zithromax 250 mg and Prednisone 10 mg (50 mg daily x 5 days). Pt will be out of medication tomorrow.   Pt states that she is feeling good.  No fever, no diarrhea still has some SOB and her breathing is not back up to normal yet.  Pt wants to know if she needs a refill on the above medication and how long will it take before she should feel back to normal.   Please advise. Rhonda J Cobb

## 2019-04-06 NOTE — Telephone Encounter (Signed)
Pt notified as instructed by phone.  Verbalizes understanding.  States she is improving and is now able to do household chores without SOB.

## 2019-04-06 NOTE — Telephone Encounter (Signed)
LMOVM for pt with above details. If she has any questions to return my call at [phone]. Rhonda J Cobb

## 2019-04-06 NOTE — Telephone Encounter (Signed)
No further meds needed at this time It will take awhile before she gets back to normal

## 2019-04-06 NOTE — Telephone Encounter (Signed)
Pt diagnosed on 04/03/2019 with Covid-19  Given Rx for Zithromax 250mg  and Prednisone 10mg  (50mg  daily x 5 days) Pt states that she is going to be out in the next few days and she is wanting to know if Dr Darnell Level is going to be refilling these medications.   [facility]  Please advise, thanks.

## 2019-04-06 NOTE — Telephone Encounter (Signed)
No need for further antibiotic or prednisone especially if feeling ok. Continue regular respiratory medications. Recovery varies significantly after Covid. Usually ~2 wk illness, and hopefully should see improvement each day. Hopefully won't have prolonged recovery/symptoms

## 2019-04-07 ENCOUNTER — Telehealth: Payer: Self-pay | Admitting: Internal Medicine

## 2019-04-07 DIAGNOSIS — J449 Chronic obstructive pulmonary disease, unspecified: Secondary | ICD-10-CM

## 2019-04-07 NOTE — Telephone Encounter (Signed)
Called and spoke to pt, who is requesting a form for insurance for daytime oxygen.  I have explained to pt that we can not prescribe daytime oxygen without proving to insurance that this is needed. Pt has only been prescribed night time oxygen.  This is the second time I have explained this to pt. (please see 04/04/2019 phone note). Advised pt that of sx worsen she should go to the ED for eval.  Pt voiced her understanding.   Will route to DK as an [place].

## 2019-04-07 NOTE — Telephone Encounter (Signed)
Pt currently has covid. How far out would you like for these test to be scheduled?

## 2019-04-07 NOTE — Telephone Encounter (Signed)
10 days

## 2019-04-07 NOTE — Telephone Encounter (Signed)
She will need ONO and 6WMT to assess for oxygen needs.  Thank you

## 2019-04-07 NOTE — Telephone Encounter (Signed)
Please verify if you would like ONO on room air on 2L? Pt currently wears 2L QHS.

## 2019-04-07 NOTE — Telephone Encounter (Signed)
Called to verify that patient received message and pt confirmed that she did. Pt stated that she was doing better but was not back at her normal yet, still requiring some 02 use.  Per patient sats would be normal and then drop to 85%.  Advised patient that we could offer a phone/virtual visit if she wanted to speak with physician about this issue and patient stated that she would give it a little more time and if she was still having this issue, she would contact us back.  Nothing else needed at this time. Rhonda J Cobb

## 2019-04-14 ENCOUNTER — Telehealth: Payer: Self-pay | Admitting: Internal Medicine

## 2019-04-14 NOTE — Telephone Encounter (Signed)
Please see 04/07/2019 phone note.

## 2019-04-14 NOTE — Telephone Encounter (Signed)
Message routed to [facility] office triage.

## 2019-04-14 NOTE — Telephone Encounter (Signed)
Pt called for update.  SMW has been ordered. DK please advise on ONO? Room air or 2L?

## 2019-04-17 NOTE — Telephone Encounter (Signed)
Just to verify, you would like ONO on 2L?

## 2019-04-17 NOTE — Telephone Encounter (Signed)
Continue 2L [place] at night  

## 2019-04-20 ENCOUNTER — Other Ambulatory Visit: Payer: Self-pay | Admitting: Internal Medicine

## 2019-04-20 DIAGNOSIS — R0902 Hypoxemia: Secondary | ICD-10-CM | POA: Diagnosis not present

## 2019-04-20 DIAGNOSIS — J449 Chronic obstructive pulmonary disease, unspecified: Secondary | ICD-10-CM

## 2019-04-21 NOTE — Telephone Encounter (Signed)
DK please advise?

## 2019-04-21 NOTE — Telephone Encounter (Signed)
Can we clarify what patient wants/needs?

## 2019-04-21 NOTE — Telephone Encounter (Signed)
ONO was ordered on 03/16/2019. Pt had ONO last night on room air.  Will route to DK to make aware.

## 2019-04-24 NOTE — Telephone Encounter (Signed)
ONO has been received and has been placed in DK's folder for review.

## 2019-04-26 NOTE — Telephone Encounter (Signed)
ONO reviewed by DK- recommend 2L QHS. Left message to make pt aware.

## 2019-04-28 NOTE — Telephone Encounter (Signed)
Pt is aware of results and voiced her understanding. Nothing further is needed.  

## 2019-04-30 DIAGNOSIS — J411 Mucopurulent chronic bronchitis: Secondary | ICD-10-CM | POA: Diagnosis not present

## 2019-04-30 DIAGNOSIS — J449 Chronic obstructive pulmonary disease, unspecified: Secondary | ICD-10-CM | POA: Diagnosis not present

## 2019-05-07 ENCOUNTER — Other Ambulatory Visit: Payer: Self-pay | Admitting: Family Medicine

## 2019-05-10 ENCOUNTER — Other Ambulatory Visit: Admission: RE | Admit: 2019-05-10 | Payer: Medicare HMO | Source: Ambulatory Visit

## 2019-05-11 ENCOUNTER — Other Ambulatory Visit: Payer: Self-pay

## 2019-05-11 ENCOUNTER — Ambulatory Visit: Payer: Medicare HMO | Attending: Internal Medicine

## 2019-05-11 DIAGNOSIS — J449 Chronic obstructive pulmonary disease, unspecified: Secondary | ICD-10-CM

## 2019-05-30 DIAGNOSIS — J411 Mucopurulent chronic bronchitis: Secondary | ICD-10-CM | POA: Diagnosis not present

## 2019-05-30 DIAGNOSIS — J449 Chronic obstructive pulmonary disease, unspecified: Secondary | ICD-10-CM | POA: Diagnosis not present

## 2019-06-13 ENCOUNTER — Other Ambulatory Visit: Payer: Self-pay | Admitting: Family Medicine

## 2019-06-13 DIAGNOSIS — Z1231 Encounter for screening mammogram for malignant neoplasm of breast: Secondary | ICD-10-CM

## 2019-06-30 DIAGNOSIS — J449 Chronic obstructive pulmonary disease, unspecified: Secondary | ICD-10-CM | POA: Diagnosis not present

## 2019-06-30 DIAGNOSIS — J411 Mucopurulent chronic bronchitis: Secondary | ICD-10-CM | POA: Diagnosis not present

## 2019-07-16 ENCOUNTER — Ambulatory Visit: Payer: Medicare HMO | Attending: Internal Medicine

## 2019-07-16 DIAGNOSIS — Z23 Encounter for immunization: Secondary | ICD-10-CM

## 2019-07-16 NOTE — Progress Notes (Signed)
   Covid-19 Vaccination Clinic  Name:  NATESHA HASSEY    MRN: [id number] DOB: September 21, 1945  07/16/2019  Ms. Riesen was observed post Covid-19 immunization for 15 minutes without incidence. She was provided with Vaccine Information Sheet and instruction to access the V-Safe system.   Ms. Lynes was instructed to call 911 with any severe reactions post vaccine: Marland Kitchen Difficulty breathing  . Swelling of your face and throat  . A fast heartbeat  . A bad rash all over your body  . Dizziness and weakness    Immunizations Administered    Name Date Dose VIS Date Route   Pfizer COVID-19 Vaccine 07/16/2019  8:45 AM 0.3 mL 05/12/2019 Intramuscular   Manufacturer: [employer], [employer]   Lot: [id number]   NDC: [address]-9

## 2019-07-20 ENCOUNTER — Ambulatory Visit: Payer: Medicare HMO

## 2019-07-24 ENCOUNTER — Other Ambulatory Visit: Payer: Self-pay | Admitting: Family Medicine

## 2019-07-24 ENCOUNTER — Other Ambulatory Visit: Payer: Self-pay

## 2019-07-24 ENCOUNTER — Other Ambulatory Visit (INDEPENDENT_AMBULATORY_CARE_PROVIDER_SITE_OTHER): Payer: Medicare HMO

## 2019-07-24 DIAGNOSIS — E782 Mixed hyperlipidemia: Secondary | ICD-10-CM

## 2019-07-24 DIAGNOSIS — E039 Hypothyroidism, unspecified: Secondary | ICD-10-CM

## 2019-07-24 DIAGNOSIS — E038 Other specified hypothyroidism: Secondary | ICD-10-CM

## 2019-07-24 DIAGNOSIS — R7303 Prediabetes: Secondary | ICD-10-CM

## 2019-07-24 DIAGNOSIS — N183 Chronic kidney disease, stage 3 unspecified: Secondary | ICD-10-CM

## 2019-07-25 LAB — LIPID PANEL
Cholesterol: 140 mg/dL (ref 0–200)
HDL: 51.2 mg/dL (ref >39.00)
LDL Cholesterol: 54 mg/dL (ref 0–99)
NonHDL: 88.91
Total CHOL/HDL Ratio: 3
Triglycerides: 174 mg/dL — ABNORMAL HIGH (ref 0.0–149.0)
VLDL: 34.8 mg/dL (ref 0.0–40.0)

## 2019-07-25 LAB — CBC WITH DIFFERENTIAL/PLATELET
Basophils Absolute: 0.1 10*3/uL (ref 0.0–0.1)
Basophils Relative: 0.9 % (ref 0.0–3.0)
Eosinophils Absolute: 0.4 10*3/uL (ref 0.0–0.7)
Eosinophils Relative: 5.7 % — ABNORMAL HIGH (ref 0.0–5.0)
HCT: 40.3 % (ref 36.0–46.0)
Hemoglobin: 13.6 g/dL (ref 12.0–15.0)
Lymphocytes Relative: 23.8 % (ref 12.0–46.0)
Lymphs Abs: 1.8 10*3/uL (ref 0.7–4.0)
MCHC: 33.9 g/dL (ref 30.0–36.0)
MCV: 91.8 fl (ref 78.0–100.0)
Monocytes Absolute: 0.8 10*3/uL (ref 0.1–1.0)
Monocytes Relative: 10 % (ref 3.0–12.0)
Neutro Abs: 4.6 10*3/uL (ref 1.4–7.7)
Neutrophils Relative %: 59.6 % (ref 43.0–77.0)
Platelets: 272 10*3/uL (ref 150.0–400.0)
RBC: 4.39 Mil/uL (ref 3.87–5.11)
RDW: 13.1 % (ref 11.5–15.5)
WBC: 7.7 10*3/uL (ref 4.0–10.5)

## 2019-07-25 LAB — COMPREHENSIVE METABOLIC PANEL
ALT: 22 U/L (ref 0–35)
AST: 21 U/L (ref 0–37)
Albumin: 4 g/dL (ref 3.5–5.2)
Alkaline Phosphatase: 92 U/L (ref 39–117)
BUN: 19 mg/dL (ref 6–23)
CO2: 28 mEq/L (ref 19–32)
Calcium: 9.4 mg/dL (ref 8.4–10.5)
Chloride: 105 mEq/L (ref 96–112)
Creatinine, Ser: 0.98 mg/dL (ref 0.40–1.20)
GFR: 55.39 mL/min — ABNORMAL LOW (ref >60.00)
Glucose, Bld: 101 mg/dL — ABNORMAL HIGH (ref 70–99)
Potassium: 4 mEq/L (ref 3.5–5.1)
Sodium: 140 mEq/L (ref 135–145)
Total Bilirubin: 0.6 mg/dL (ref 0.2–1.2)
Total Protein: 6.4 g/dL (ref 6.0–8.3)

## 2019-07-25 LAB — T4, FREE: Free T4: 1.95 ng/dL — ABNORMAL HIGH (ref 0.60–1.60)

## 2019-07-25 LAB — VITAMIN D 25 HYDROXY (VIT D DEFICIENCY, FRACTURES): VITD: 52.62 ng/mL (ref 30.00–100.00)

## 2019-07-25 LAB — TSH: TSH: 3.34 u[IU]/mL (ref 0.35–4.50)

## 2019-07-25 LAB — HEMOGLOBIN A1C: Hgb A1c MFr Bld: 6.4 % (ref 4.6–6.5)

## 2019-07-28 ENCOUNTER — Ambulatory Visit (INDEPENDENT_AMBULATORY_CARE_PROVIDER_SITE_OTHER): Payer: Medicare HMO

## 2019-07-28 ENCOUNTER — Other Ambulatory Visit: Payer: Self-pay

## 2019-07-28 ENCOUNTER — Ambulatory Visit: Payer: Medicare HMO

## 2019-07-28 ENCOUNTER — Other Ambulatory Visit: Payer: Medicare HMO

## 2019-07-28 DIAGNOSIS — Z0000 Encounter for general adult medical examination without abnormal findings: Secondary | ICD-10-CM

## 2019-07-28 NOTE — Progress Notes (Signed)
Subjective:   Stacey Durham is a 74 y.o. female who presents for Medicare Annual (Subsequent) preventive examination.  Review of Systems: N/A   This visit is being conducted through telemedicine via telephone at the nurse health advisor's home address due to the COVID-19 pandemic. This patient has given me verbal consent via doximity to conduct this visit, patient states they are participating from their home address. Patient and myself are on the telephone call. There is no referral for this visit. Some vital signs may be absent or patient reported.    Patient identification: identified by name, DOB, and current address   Cardiac Risk Factors include: advanced age (>50men, >71 women);dyslipidemia     Objective:     Vitals: There were no vitals taken for this visit.  There is no height or weight on file to calculate BMI.  Advanced Directives 07/28/2019 04/03/2019 03/30/2019 07/21/2018 01/29/2018 07/14/2017 10/29/2016  Does Patient Have a Medical Advance Directive? No No No No No No No  Would patient like information on creating a medical advance directive? No - Patient declined - No - Patient declined No - Patient declined No - Patient declined No - Patient declined No - Patient declined    Tobacco Social History   Tobacco Use  Smoking Status Former Smoker  . Packs/day: 1.00  . Years: 40.00  . Pack years: 40.00  . Types: Cigarettes  . Quit date: 2003  . Years since quitting: 18.1  Smokeless Tobacco Never Used     Counseling given: Not Answered   Clinical Intake:  Pre-visit preparation completed: Yes  Pain : No/denies pain     Nutritional Risks: None Diabetes: No  How often do you need to have someone help you when you read instructions, pamphlets, or other written materials from your doctor or pharmacy?: 1 - Never What is the last grade level you completed in school?: 12th  Interpreter Needed?: No  Information entered by :: CJohnson, LPN  Past Medical  History:  Diagnosis Date  . Acute cholecystitis   . Cholecystitis with cholelithiasis 10/11/2015  . COPD (chronic obstructive pulmonary disease) (HCC)   . Ex-smoker    quit 2000s  . Pneumonia 11/26/2015   Past Surgical History:  Procedure Laterality Date  . ABDOMINAL HYSTERECTOMY  1978   part of an ovary remained  . APPENDECTOMY  1978  . CATARACT EXTRACTION, BILATERAL  2013  . CHOLECYSTECTOMY N/A 10/11/2015   Procedure: LAPAROSCOPIC CHOLECYSTECTOMY;  Surgeon: Leafy Ro, MD;  Location: [facility] ORS;  Service: General;  Laterality: N/A;  . COLONOSCOPY  07/2016   HP, ext hem, diverticulosis, rpt 5 yrs (Outlaw)  . TONSILLECTOMY     Family History  Problem Relation Age of Onset  . Heart attack Father 37  . Diabetes Father   . Diabetes Mother   . Stroke Neg Hx   . Cancer Neg Hx    Social History   Socioeconomic History  . Marital status: Widowed    Spouse name: Not on file  . Number of children: Not on file  . Years of education: Not on file  . Highest education level: Not on file  Occupational History  . Not on file  Tobacco Use  . Smoking status: Former Smoker    Packs/day: 1.00    Years: 40.00    Pack years: 40.00    Types: Cigarettes    Quit date: 2003    Years since quitting: 18.1  . Smokeless tobacco: Never Used  Substance and  Sexual Activity  . Alcohol use: No  . Drug use: No  . Sexual activity: Not on file  Other Topics Concern  . Not on file  Social History Narrative   Originally from [place]   Widower - husband passed away 2010-10-07   Lives alone, 1 chihuahua   GF of Page Spiro   Occ: retired, worked at [employer]: enjoys going to [employer]: good water, fruits/vegetables daily   Social Determinants of Corporate investment banker Strain: Low Risk   . Difficulty of Paying Living Expenses: Not hard at all  Food Insecurity: No Food Insecurity  . Worried About Programme researcher, broadcasting/film/video in the Last Year: Never true  . Ran Out of Food in the  Last Year: Never true  Transportation Needs: No Transportation Needs  . Lack of Transportation (Medical): No  . Lack of Transportation (Non-Medical): No  Physical Activity: Inactive  . Days of Exercise per Week: 0 days  . Minutes of Exercise per Session: 0 min  Stress: No Stress Concern Present  . Feeling of Stress : Not at all  Social Connections:   . Frequency of Communication with Friends and Family: Not on file  . Frequency of Social Gatherings with Friends and Family: Not on file  . Attends Religious Services: Not on file  . Active Member of Clubs or Organizations: Not on file  . Attends Banker Meetings: Not on file  . Marital Status: Not on file    Outpatient Encounter Medications as of 07/28/2019  Medication Sig  . atorvastatin (LIPITOR) 20 MG tablet TAKE 1 TABLET BY MOUTH EVERY DAY  . Biotin 76734 MCG TABS Take 1 tablet by mouth.  . calcium-vitamin D (OSCAL WITH D) 500-200 MG-UNIT tablet Take 1 tablet by mouth.  . Dextromethorphan-Guaifenesin (MUCINEX DM PO) Take 1 tablet by mouth daily.   . Fluticasone-Umeclidin-Vilant (TRELEGY ELLIPTA) 100-62.5-25 MCG/INH AEPB Inhale 1 Act into the lungs every morning.  . Multiple Vitamins-Minerals (MULTIVITAMIN ADULT PO) Take 1 tablet by mouth.  . predniSONE (DELTASONE) 10 MG tablet Take 5 tablets (50 mg total) by mouth daily.  . Probiotic Product (PROBIOTIC-10 PO) Take 1 tablet by mouth.  . Respiratory Therapy Supplies (FLUTTER) DEVI 1 each by Does not apply route once.  . vitamin B-12 (CYANOCOBALAMIN) 1000 MCG tablet Take 1,000 mcg by mouth daily.  Marland Kitchen albuterol (PROVENTIL) (2.5 MG/3ML) 0.083% nebulizer solution Take 3 mLs (2.5 mg total) by nebulization every 4 (four) hours as needed for wheezing or shortness of breath. (Patient not taking: Reported on 04/03/2019)  . albuterol (VENTOLIN HFA) 108 (90 Base) MCG/ACT inhaler INHALE 2 PUFFS INTO THE LUNGS EVERY 4 (FOUR) HOURS AS NEEDED FOR WHEEZING OR SHORTNESS OF BREATH. (Patient not  taking: Reported on 04/03/2019)  . azithromycin (ZITHROMAX) 250 MG tablet 2 tablets today, then 1 tablet for the next 4 days. (Patient not taking: Reported on 07/28/2019)  . fluticasone (FLONASE) 50 MCG/ACT nasal spray SPRAY 2 SPRAYS INTO EACH NOSTRIL EVERY DAY (Patient not taking: Reported on 04/03/2019)  . ipratropium-albuterol (DUONEB) 0.5-2.5 (3) MG/3ML SOLN Take 3 mLs by nebulization every 4 (four) hours as needed. (Patient not taking: Reported on 04/03/2019)  . loperamide (IMODIUM A-D) 2 MG tablet Take 1 tablet (2 mg total) by mouth daily as needed for diarrhea or loose stools. (Patient not taking: Reported on 04/03/2019)  . montelukast (SINGULAIR) 10 MG tablet TAKE 1 TABLET BY MOUTH EVERY DAY (Patient not taking: Reported on 04/03/2019)  No facility-administered encounter medications on file as of 07/28/2019.    Activities of Daily Living In your present state of health, do you have any difficulty performing the following activities: 07/28/2019  Hearing? N  Vision? N  Difficulty concentrating or making decisions? N  Walking or climbing stairs? N  Dressing or bathing? N  Doing errands, shopping? N  Preparing Food and eating ? N  Using the Toilet? N  In the past six months, have you accidently leaked urine? N  Do you have problems with loss of bowel control? N  Managing your Medications? N  Managing your Finances? N  Housekeeping or managing your Housekeeping? N  Some recent data might be hidden    Patient Care Team: Eustaquio Boyden, MD as PCP - General (Family Medicine) Willis Modena, MD as Consulting Physician (Gastroenterology) Carrie Mew, OD as Consulting Physician (Optometry)    Assessment:   This is a routine wellness examination for Nyra.  Exercise Activities and Dietary recommendations Current Exercise Habits: The patient does not participate in regular exercise at present, Exercise limited by: None identified  Goals    . Increase physical activity      Starting 07/21/18, I will continue to exercise for 45 minutes 3 days per week.     . Patient Stated     07/28/2019, I will maintain and continue medications as prescribed.        Fall Risk Fall Risk  07/28/2019 07/21/2018 07/14/2017 06/22/2016  Falls in the past year? 0 0 No No  Number falls in past yr: 0 - - -  Injury with Fall? 0 - - -  Risk for fall due to : No Fall Risks - - -  Follow up Falls evaluation completed;Falls prevention discussed - - -   Is the patient's home free of loose throw rugs in walkways, pet beds, electrical cords, etc?   yes      Grab bars in the bathroom? no      Handrails on the stairs?   yes      Adequate lighting?   yes  Timed Get Up and Go performed: N/A  Depression Screen PHQ 2/9 Scores 07/28/2019 07/21/2018 07/14/2017 06/22/2016  PHQ - 2 Score 0 0 0 0  PHQ- 9 Score 0 0 0 -     Cognitive Function MMSE - Mini Mental State Exam 07/28/2019 07/21/2018 07/14/2017 06/22/2016  Orientation to time 5 5 5 5   Orientation to Place 5 5 5 5   Registration 3 3 3 3   Attention/ Calculation 5 0 0 0  Recall 3 1 3 3   Recall-comments - unable to recall 2 of 3 words - -  Language- name 2 objects - 0 0 0  Language- repeat 1 1 1 1   Language- follow 3 step command - 3 3 3   Language- read & follow direction - 0 0 0  Write a sentence - 0 0 0  Copy design - 0 0 0  Total score - 18 20 20   Mini Cog  Mini-Cog screen was completed. Maximum score is 22. A value of 0 denotes this part of the MMSE was not completed or the patient failed this part of the Mini-Cog screening.       Immunization History  Administered Date(s) Administered  . Fluad Quad(high Dose 65+) 02/03/2019  . Influenza Split 01/30/2015  . Influenza, High Dose Seasonal PF 02/07/2018  . Influenza,inj,Quad PF,6+ Mos 01/31/2016, 06/24/2017  . Influenza-Unspecified 06/05/2015  . PFIZER SARS-COV-2 Vaccination 07/16/2019  .  Pneumococcal Conjugate-13 06/05/2015  . Pneumococcal Polysaccharide-23 03/28/2012  . Tdap  06/23/2012    Qualifies for Shingles Vaccine: Yes  Screening Tests Health Maintenance  Topic Date Due  . DTAP VACCINES (1) 02/16/1946  . MAMMOGRAM  07/27/2020 (Originally 07/20/2019)  . COLONOSCOPY  08/12/2021  . DTaP/Tdap/Td (2 - Td) 06/23/2022  . TETANUS/TDAP  06/23/2022  . INFLUENZA VACCINE  Completed  . DEXA SCAN  Completed  . Hepatitis C Screening  Completed  . PNA vac Low Risk Adult  Completed    Cancer Screenings: Lung: Low Dose CT Chest recommended if Age 66-80 years, 30 pack-year currently smoking OR have quit w/in 15years. Patient does not qualify. Breast:  Up to date on Mammogram? No, postponed until after her COVID vaccines are complete.  Up to date of Bone Density/Dexa: Yes, completed 09/22/2017 Colorectal: completed 08/12/2016  Additional Screenings:  Hepatitis C Screening: 06/22/2016     Plan:   Patient will maintain and continue medications as prescribed.    I have personally reviewed and noted the following in the patient's chart:   . Medical and social history . Use of alcohol, tobacco or illicit drugs  . Current medications and supplements . Functional ability and status . Nutritional status . Physical activity . Advanced directives . List of other physicians . Hospitalizations, surgeries, and ER visits in previous 12 months . Vitals . Screenings to include cognitive, depression, and falls . Referrals and appointments  In addition, I have reviewed and discussed with patient certain preventive protocols, quality metrics, and best practice recommendations. A written personalized care plan for preventive services as well as general preventive health recommendations were provided to patient.     Andrez Grime, LPN  1/85/[id number]

## 2019-07-28 NOTE — Progress Notes (Signed)
PCP notes:  Health Maintenance: Mammogram- postponed until after she completes her COVID vaccines.   Abnormal Screenings: none   Patient concerns: none   Nurse concerns: none   Next PCP appt.: 08/04/2019 @ 8:30 am

## 2019-07-28 NOTE — Patient Instructions (Signed)
Stacey Durham , Thank you for taking time to come for your Medicare Wellness Visit. I appreciate your ongoing commitment to your health goals. Please review the following plan we discussed and let me know if I can assist you in the future.   Screening recommendations/referrals: Colonoscopy: Up to date, completed 08/12/2016 Mammogram: declined until after COVID vaccines Bone Density: Up to date, completed 09/22/2017 Recommended yearly ophthalmology/optometry visit for glaucoma screening and checkup Recommended yearly dental visit for hygiene and checkup  Vaccinations: Influenza vaccine: Up to date, completed 02/03/2019 Pneumococcal vaccine: Completed series Tdap vaccine: Up to date, completed 06/23/2012 Shingles vaccine: discussed    Advanced directives: Advance directive discussed with you today. Even though you declined this today please call our office should you change your mind and we can give you the proper paperwork for you to fill out.  Conditions/risks identified: hyperlipidemia  Next appointment: 08/04/2019 @ 8:30 am    Preventive Care 65 Years and Older, Female Preventive care refers to lifestyle choices and visits with your health care provider that can promote health and wellness. What does preventive care include?  A yearly physical exam. This is also called an annual well check.  Dental exams once or twice a year.  Routine eye exams. Ask your health care provider how often you should have your eyes checked.  Personal lifestyle choices, including:  Daily care of your teeth and gums.  Regular physical activity.  Eating a healthy diet.  Avoiding tobacco and drug use.  Limiting alcohol use.  Practicing safe sex.  Taking low-dose aspirin every day.  Taking vitamin and mineral supplements as recommended by your health care provider. What happens during an annual well check? The services and screenings done by your health care provider during your annual well check  [place] depend on your age, overall health, lifestyle risk factors, and family history of disease. Counseling  Your health care provider may ask you questions about your:  Alcohol use.  Tobacco use.  Drug use.  Emotional well-being.  Home and relationship well-being.  Sexual activity.  Eating habits.  History of falls.  Memory and ability to understand (cognition).  Work and work Astronomer.  Reproductive health. Screening  You may have the following tests or measurements:  Height, weight, and BMI.  Blood pressure.  Lipid and cholesterol levels. These may be checked every 5 years, or more frequently if you are over 12 years old.  Skin check.  Lung cancer screening. You may have this screening every year starting at age 19 if you have a 30-pack-year history of smoking and currently smoke or have quit within the past 15 years.  Fecal occult blood test (FOBT) of the stool. You may have this test every year starting at age 83.  Flexible sigmoidoscopy or colonoscopy. You may have a sigmoidoscopy every 5 years or a colonoscopy every 10 years starting at age 32.  Hepatitis C blood test.  Hepatitis B blood test.  Sexually transmitted disease (STD) testing.  Diabetes screening. This is done by checking your blood sugar (glucose) after you have not eaten for a while (fasting). You may have this done every 1-3 years.  Bone density scan. This is done to screen for osteoporosis. You may have this done starting at age 28.  Mammogram. This may be done every 1-2 years. Talk to your health care provider about how often you should have regular mammograms. Talk with your health care provider about your test results, treatment options, and if necessary, the need  for more tests. Vaccines  Your health care provider may recommend certain vaccines, such as:  Influenza vaccine. This is recommended every year.  Tetanus, diphtheria, and acellular pertussis (Tdap, Td) vaccine. You may  need a Td booster every 10 years.  Zoster vaccine. You may need this after age 74.  Pneumococcal 13-valent conjugate (PCV13) vaccine. One dose is recommended after age 75.  Pneumococcal polysaccharide (PPSV23) vaccine. One dose is recommended after age 35. Talk to your health care provider about which screenings and vaccines you need and how often you need them. This information is not intended to replace advice given to you by your health care provider. Make sure you discuss any questions you have with your health care provider. Document Released: 06/14/2015 Document Revised: 02/05/2016 Document Reviewed: 03/19/2015 Elsevier Interactive Patient Education  2017 [place] Prevention in the Home Falls can cause injuries. They can happen to people of all ages. There are many things you can do to make your home safe and to help prevent falls. What can I do on the outside of my home?  Regularly fix the edges of walkways and driveways and fix any cracks.  Remove anything that might make you trip as you walk through a door, such as a raised step or threshold.  Trim any bushes or trees on the path to your home.  Use bright outdoor lighting.  Clear any walking paths of anything that might make someone trip, such as rocks or tools.  Regularly check to see if handrails are loose or broken. Make sure that both sides of any steps have handrails.  Any raised decks and porches should have guardrails on the edges.  Have any leaves, snow, or ice cleared regularly.  Use sand or salt on walking paths during winter.  Clean up any spills in your garage right away. This includes oil or grease spills. What can I do in the bathroom?  Use night lights.  Install grab bars by the toilet and in the tub and shower. Do not use towel bars as grab bars.  Use non-skid mats or decals in the tub or shower.  If you need to sit down in the shower, use a plastic, non-slip stool.  Keep the floor  dry. Clean up any water that spills on the floor as soon as it happens.  Remove soap buildup in the tub or shower regularly.  Attach bath mats securely with double-sided non-slip rug tape.  Do not have throw rugs and other things on the floor that can make you trip. What can I do in the bedroom?  Use night lights.  Make sure that you have a light by your bed that is easy to reach.  Do not use any sheets or blankets that are too big for your bed. They should not hang down onto the floor.  Have a firm chair that has side arms. You can use this for support while you get dressed.  Do not have throw rugs and other things on the floor that can make you trip. What can I do in the kitchen?  Clean up any spills right away.  Avoid walking on wet floors.  Keep items that you use a lot in easy-to-reach places.  If you need to reach something above you, use a strong step stool that has a grab bar.  Keep electrical cords out of the way.  Do not use floor polish or wax that makes floors slippery. If you must use wax, use  non-skid floor wax.  Do not have throw rugs and other things on the floor that can make you trip. What can I do with my stairs?  Do not leave any items on the stairs.  Make sure that there are handrails on both sides of the stairs and use them. Fix handrails that are broken or loose. Make sure that handrails are as long as the stairways.  Check any carpeting to make sure that it is firmly attached to the stairs. Fix any carpet that is loose or worn.  Avoid having throw rugs at the top or bottom of the stairs. If you do have throw rugs, attach them to the floor with carpet tape.  Make sure that you have a light switch at the top of the stairs and the bottom of the stairs. If you do not have them, ask someone to add them for you. What else can I do to help prevent falls?  Wear shoes that:  Do not have high heels.  Have rubber bottoms.  Are comfortable and fit you  well.  Are closed at the toe. Do not wear sandals.  If you use a stepladder:  Make sure that it is fully opened. Do not climb a closed stepladder.  Make sure that both sides of the stepladder are locked into place.  Ask someone to hold it for you, if possible.  Clearly mark and make sure that you can see:  Any grab bars or handrails.  First and last steps.  Where the edge of each step is.  Use tools that help you move around (mobility aids) if they are needed. These include:  Canes.  Walkers.  Scooters.  Crutches.  Turn on the lights when you go into a dark area. Replace any light bulbs as soon as they burn out.  Set up your furniture so you have a clear path. Avoid moving your furniture around.  If any of your floors are uneven, fix them.  If there are any pets around you, be aware of where they are.  Review your medicines with your doctor. Some medicines can make you feel dizzy. This can increase your chance of falling. Ask your doctor what other things that you can do to help prevent falls. This information is not intended to replace advice given to you by your health care provider. Make sure you discuss any questions you have with your health care provider. Document Released: 03/14/2009 Document Revised: 10/24/2015 Document Reviewed: 06/22/2014 Elsevier Interactive Patient Education  2017 [employer].

## 2019-07-29 ENCOUNTER — Other Ambulatory Visit: Payer: Self-pay | Admitting: Internal Medicine

## 2019-07-29 DIAGNOSIS — J449 Chronic obstructive pulmonary disease, unspecified: Secondary | ICD-10-CM

## 2019-07-30 DIAGNOSIS — J449 Chronic obstructive pulmonary disease, unspecified: Secondary | ICD-10-CM | POA: Diagnosis not present

## 2019-07-30 DIAGNOSIS — J411 Mucopurulent chronic bronchitis: Secondary | ICD-10-CM | POA: Diagnosis not present

## 2019-08-04 ENCOUNTER — Ambulatory Visit (INDEPENDENT_AMBULATORY_CARE_PROVIDER_SITE_OTHER): Payer: Medicare HMO | Admitting: Family Medicine

## 2019-08-04 ENCOUNTER — Other Ambulatory Visit: Payer: Self-pay | Admitting: Family Medicine

## 2019-08-04 ENCOUNTER — Encounter: Payer: Self-pay | Admitting: Family Medicine

## 2019-08-04 ENCOUNTER — Other Ambulatory Visit: Payer: Self-pay

## 2019-08-04 VITALS — BP 130/74 | HR 70 | Temp 97.6°F | Ht 65.0 in | Wt 150.2 lb

## 2019-08-04 DIAGNOSIS — Z7189 Other specified counseling: Secondary | ICD-10-CM | POA: Diagnosis not present

## 2019-08-04 DIAGNOSIS — M8580 Other specified disorders of bone density and structure, unspecified site: Secondary | ICD-10-CM | POA: Diagnosis not present

## 2019-08-04 DIAGNOSIS — E782 Mixed hyperlipidemia: Secondary | ICD-10-CM

## 2019-08-04 DIAGNOSIS — I700 Atherosclerosis of aorta: Secondary | ICD-10-CM

## 2019-08-04 DIAGNOSIS — J439 Emphysema, unspecified: Secondary | ICD-10-CM | POA: Diagnosis not present

## 2019-08-04 DIAGNOSIS — E039 Hypothyroidism, unspecified: Secondary | ICD-10-CM

## 2019-08-04 DIAGNOSIS — R7303 Prediabetes: Secondary | ICD-10-CM

## 2019-08-04 DIAGNOSIS — Z87891 Personal history of nicotine dependence: Secondary | ICD-10-CM | POA: Diagnosis not present

## 2019-08-04 DIAGNOSIS — L659 Nonscarring hair loss, unspecified: Secondary | ICD-10-CM | POA: Diagnosis not present

## 2019-08-04 DIAGNOSIS — N1831 Chronic kidney disease, stage 3a: Secondary | ICD-10-CM

## 2019-08-04 DIAGNOSIS — E038 Other specified hypothyroidism: Secondary | ICD-10-CM

## 2019-08-04 DIAGNOSIS — Z0000 Encounter for general adult medical examination without abnormal findings: Secondary | ICD-10-CM

## 2019-08-04 LAB — TSH: TSH: 4.74 u[IU]/mL — ABNORMAL HIGH (ref 0.35–4.50)

## 2019-08-04 LAB — T4, FREE: Free T4: 0.95 ng/dL (ref 0.60–1.60)

## 2019-08-04 NOTE — Patient Instructions (Addendum)
If interested, check with pharmacy about new 2 shot shingles series (shingrix). Wait until summer 2021 (due to recent covid shots).  Advanced directive packet provided today.  Repeat labs today. We may refer you to skin doctor to endocrinology pending results.  You are doing well today Continue current medicines Return as needed or in 1 year for next physical.   Health Maintenance After Age 75 After age 65, you are at a higher risk for certain long-term diseases and infections as well as injuries from falls. Falls are a major cause of broken bones and head injuries in people who are older than age 95. Getting regular preventive care can help to keep you healthy and well. Preventive care includes getting regular testing and making lifestyle changes as recommended by your health care provider. Talk with your health care provider about:  Which screenings and tests you should have. A screening is a test that checks for a disease when you have no symptoms.  A diet and exercise plan that is right for you. What should I know about screenings and tests to prevent falls? Screening and testing are the best ways to find a health problem early. Early diagnosis and treatment give you the best chance of managing medical conditions that are common after age 46. Certain conditions and lifestyle choices may make you more likely to have a fall. Your health care provider may recommend:  Regular vision checks. Poor vision and conditions such as cataracts can make you more likely to have a fall. If you wear glasses, make sure to get your prescription updated if your vision changes.  Medicine review. Work with your health care provider to regularly review all of the medicines you are taking, including over-the-counter medicines. Ask your health care provider about any side effects that may make you more likely to have a fall. Tell your health care provider if any medicines that you take make you feel dizzy or  sleepy.  Osteoporosis screening. Osteoporosis is a condition that causes the bones to get weaker. This can make the bones weak and cause them to break more easily.  Blood pressure screening. Blood pressure changes and medicines to control blood pressure can make you feel dizzy.  Strength and balance checks. Your health care provider may recommend certain tests to check your strength and balance while standing, walking, or changing positions.  Foot health exam. Foot pain and numbness, as well as not wearing proper footwear, can make you more likely to have a fall.  Depression screening. You may be more likely to have a fall if you have a fear of falling, feel emotionally low, or feel unable to do activities that you used to do.  Alcohol use screening. Using too much alcohol can affect your balance and may make you more likely to have a fall. What actions can I take to lower my risk of falls? General instructions  Talk with your health care provider about your risks for falling. Tell your health care provider if: ? You fall. Be sure to tell your health care provider about all falls, even ones that seem minor. ? You feel dizzy, sleepy, or off-balance.  Take over-the-counter and prescription medicines only as told by your health care provider. These include any supplements.  Eat a healthy diet and maintain a healthy weight. A healthy diet includes low-fat dairy products, low-fat (lean) meats, and fiber from whole grains, beans, and lots of fruits and vegetables. Home safety  Remove any tripping hazards, such as  rugs, cords, and clutter.  Install safety equipment such as grab bars in bathrooms and safety rails on stairs.  Keep rooms and walkways well-lit. Activity   Follow a regular exercise program to stay fit. This will help you maintain your balance. Ask your health care provider what types of exercise are appropriate for you.  If you need a cane or walker, use it as recommended by  your health care provider.  Wear supportive shoes that have nonskid soles. Lifestyle  Do not drink alcohol if your health care provider tells you not to drink.  If you drink alcohol, limit how much you have: ? 0-1 drink a day for women. ? 0-2 drinks a day for men.  Be aware of how much alcohol is in your drink. In the [place] one drink equals one typical bottle of beer (12 oz), one-half glass of wine (5 oz), or one shot of hard liquor (1 oz).  Do not use any products that contain nicotine or tobacco, such as cigarettes and e-cigarettes. If you need help quitting, ask your health care provider. Summary  Having a healthy lifestyle and getting preventive care can help to protect your health and wellness after age 50.  Screening and testing are the best way to find a health problem early and help you avoid having a fall. Early diagnosis and treatment give you the best chance for managing medical conditions that are more common for people who are older than age 24.  Falls are a major cause of broken bones and head injuries in people who are older than age 69. Take precautions to prevent a fall at home.  Work with your health care provider to learn what changes you can make to improve your health and wellness and to prevent falls. This information is not intended to replace advice given to you by your health care provider. Make sure you discuss any questions you have with your health care provider. Document Revised: 09/08/2018 Document Reviewed: 03/31/2017 Elsevier Patient Education  2020 [employer].

## 2019-08-04 NOTE — Assessment & Plan Note (Signed)
Chronic, stable on current regimen. No recent COPD flares. Appreciate pulm care.

## 2019-08-04 NOTE — Assessment & Plan Note (Signed)
Reviewed A1c 6.4%. Encouraged low sugar/carb diet.

## 2019-08-04 NOTE — Progress Notes (Signed)
This visit was conducted in person.  BP 130/74 (BP Location: Left Arm, Patient Position: Sitting, Cuff Size: Normal)   Pulse 70   Temp 97.6 F (36.4 C) (Temporal)   Ht 5\' 5"  (1.651 m)   Wt 150 lb 4 oz (68.2 kg)   SpO2 96%   BMI 25.00 kg/m    CC: CPE Subjective:    Patient ID: Stacey Durham, female    DOB: 10-07-1945, 74 y.o.   MRN: [id number]  HPI: Stacey Durham is a 74 y.o. female presenting on 08/04/2019 for Annual Exam (Prt 2. )    Saw health advisor last week for medicare wellness visit. Note reviewed.    No exam data present    Clinical Support from 07/28/2019 in [facility] Total Score  0      Fall Risk  08/04/2019 07/28/2019 07/21/2018 07/14/2017 06/22/2016  Falls in the past year? 1 0 0 No No  Number falls in past yr: 0 0 - - -  Injury with Fall? 1 0 - - -  Risk for fall due to : History of fall(s) No Fall Risks - - -  Follow up Falls prevention discussed Falls evaluation completed;Falls prevention discussed - - -      Known severe COPD with chronic dyspnea and wheezing, followed by pulm (Kasa) on singulair, trelegy, duonebs PRN. Also on flonase regularly. Uses night time O2 at 2L. No recent COPD flares.   Notes recent hair loss - just started using wig yesterday.  Notes heat intolerance (for 6 months) and loose stools (for years). Was using pantene shampoo/conditioner, has changed to new shampoo.   Preventative: COLONOSCOPY 07/2016 HP, ext hem, diverticulosis, rpt 5 yrs (Outlaw) Lung cancer screening -recent chest CT 12/2016 stable. Completed screening. Breast cancer screening -mammo WNL2/2020. She does not do breast exams at home. decided to postpone until after completes Covid series.  Well woman exam -s/p hysterectomy 1976-09-22, part of an ovary remains. DEXA T score -2.2 spine, -1.2 hip 09-22-17). Takes 1 cal/vit D pill daily and MVI. Discussed weight bearing exercise.  Flu shot -yearly Tdap September 22, 2012 Pneumovax 03/2012,  prevnar 06/2015 Pfizer covid vaccine 07/2019, to complete next week.  shingrix - discussed Advanced directive discussion - packet previously provided - again today. HCPOA -likely 07/2019 but not fully sure. Seat belt use discussed Sunscreen usediscussed. No changing moles on skin. Ex smoker quit 2001/09/22 Alcohol - none Dentist - doesn't see, has full dentures Eye exam yearly  Bowel - no constipation  Bladder - no incontinence   Originally from [place] Wid[place] - husband passed away 2010/09/23 Lives alone, 1 chihuahua GF of 2013 Occ: retired, worked at [employer]: enjoys going to [employer]: good water, fruits/vegetables daily     Relevant past medical, surgical, family and social history reviewed and updated as indicated. Interim medical history since our last visit reviewed. Allergies and medications reviewed and updated. Outpatient Medications Prior to Visit  Medication Sig Dispense Refill  . albuterol (PROVENTIL) (2.5 MG/3ML) 0.083% nebulizer solution Take 3 mLs (2.5 mg total) by nebulization every 4 (four) hours as needed for wheezing or shortness of breath. 300 mL 1  . albuterol (VENTOLIN HFA) 108 (90 Base) MCG/ACT inhaler INHALE 2 PUFFS INTO THE LUNGS EVERY 4 (FOUR) HOURS AS NEEDED FOR WHEEZING OR SHORTNESS OF BREATH. 18 Inhaler 6  . atorvastatin (LIPITOR) 20 MG tablet TAKE 1 TABLET BY MOUTH EVERY DAY 90 tablet 0  . Biotin [employer] MCG  TABS Take 1 tablet by mouth.    . calcium-vitamin D (OSCAL WITH D) 500-200 MG-UNIT tablet Take 1 tablet by mouth.    . Dextromethorphan-Guaifenesin (MUCINEX DM PO) Take 1 tablet by mouth daily.     . fluticasone (FLONASE) 50 MCG/ACT nasal spray SPRAY 2 SPRAYS INTO EACH NOSTRIL EVERY DAY 48 mL 1  . ipratropium-albuterol (DUONEB) 0.5-2.5 (3) MG/3ML SOLN Take 3 mLs by nebulization every 4 (four) hours as needed. 360 mL 1  . loperamide (IMODIUM A-D) 2 MG tablet Take 1 tablet (2 mg total) by mouth daily as needed for diarrhea or loose stools.  0   . montelukast (SINGULAIR) 10 MG tablet TAKE 1 TABLET BY MOUTH EVERY DAY 90 tablet 2  . Multiple Vitamins-Minerals (MULTIVITAMIN ADULT PO) Take 1 tablet by mouth.    . Probiotic Product (PROBIOTIC-10 PO) Take 1 tablet by mouth.    . Respiratory Therapy Supplies (FLUTTER) DEVI 1 each by Does not apply route once. 1 each 0  . TRELEGY ELLIPTA 100-62.5-25 MCG/INH AEPB TAKE 1 PUFF INTO THE LUNGS EVERY MORNING 60 each 6  . vitamin B-12 (CYANOCOBALAMIN) 1000 MCG tablet Take 1,000 mcg by mouth daily.    Marland Kitchen azithromycin (ZITHROMAX) 250 MG tablet 2 tablets today, then 1 tablet for the next 4 days. (Patient not taking: Reported on 07/28/2019) 6 each 0  . predniSONE (DELTASONE) 10 MG tablet Take 5 tablets (50 mg total) by mouth daily. 25 tablet 0   No facility-administered medications prior to visit.     Per HPI unless specifically indicated in ROS section below Review of Systems  Constitutional: Negative for activity change, appetite change, chills, fatigue, fever and unexpected weight change.  HENT: Negative for hearing loss.   Eyes: Negative for visual disturbance.  Respiratory: Negative for cough, chest tightness, shortness of breath and wheezing.   Cardiovascular: Negative for chest pain, palpitations and leg swelling.  Gastrointestinal: Negative for abdominal distention, abdominal pain, blood in stool, constipation, diarrhea, nausea and vomiting.  Genitourinary: Negative for difficulty urinating and hematuria.  Musculoskeletal: Negative for arthralgias, myalgias and neck pain.  Skin: Negative for rash.  Neurological: Negative for dizziness, seizures, syncope and headaches.  Hematological: Negative for adenopathy. Bruises/bleeds easily.  Psychiatric/Behavioral: Negative for dysphoric mood. The patient is not nervous/anxious.    Objective:    BP 130/74 (BP Location: Left Arm, Patient Position: Sitting, Cuff Size: Normal)   Pulse 70   Temp 97.6 F (36.4 C) (Temporal)   Ht 5\' 5"  (1.651 m)   Wt  150 lb 4 oz (68.2 kg)   SpO2 96%   BMI 25.00 kg/m   Wt Readings from Last 3 Encounters:  08/04/19 150 lb 4 oz (68.2 kg)  04/03/19 147 lb (66.7 kg)  04/03/19 147 lb 4 oz (66.8 kg)    Physical Exam Vitals and nursing note reviewed.  Constitutional:      General: She is not in acute distress.    Appearance: Normal appearance. She is well-developed. She is not ill-appearing.  HENT:     Head: Normocephalic and atraumatic.     Comments: Thinning hair diffusely without obvious scalp erythema/inflammation    Right Ear: Hearing, tympanic membrane, ear canal and external ear normal.     Left Ear: Hearing, tympanic membrane, ear canal and external ear normal.     Mouth/Throat:     Pharynx: Uvula midline.  Eyes:     General: No scleral icterus.    Extraocular Movements: Extraocular movements intact.     Conjunctiva/sclera:  Conjunctivae normal.     Pupils: Pupils are equal, round, and reactive to light.  Cardiovascular:     Rate and Rhythm: Normal rate and regular rhythm.     Pulses: Normal pulses.          Radial pulses are 2+ on the right side and 2+ on the left side.     Heart sounds: Normal heart sounds. No murmur.  Pulmonary:     Effort: Pulmonary effort is normal. No respiratory distress.     Breath sounds: Normal breath sounds. No wheezing, rhonchi or rales.  Abdominal:     General: Abdomen is flat. Bowel sounds are normal. There is no distension.     Palpations: Abdomen is soft. There is no mass.     Tenderness: There is no abdominal tenderness. There is no guarding or rebound.     Hernia: No hernia is present.  Musculoskeletal:        General: Normal range of motion.     Cervical back: Normal range of motion and neck supple.     Right lower leg: No edema.     Left lower leg: No edema.  Lymphadenopathy:     Cervical: No cervical adenopathy.  Skin:    General: Skin is warm and dry.     Findings: No rash.  Neurological:     General: No focal deficit present.     Mental  Status: She is alert and oriented to person, place, and time.     Comments: CN grossly intact, station and gait intact  Psychiatric:        Mood and Affect: Mood normal.        Behavior: Behavior normal.        Thought Content: Thought content normal.        Judgment: Judgment normal.       Results for orders placed or performed in visit on 07/24/19  VITAMIN D 25 Hydroxy (Vit-D Deficiency, Fractures)  Result Value Ref Range   VITD 52.62 30.00 - 100.00 ng/mL  CBC with Differential/Platelet  Result Value Ref Range   WBC 7.7 4.0 - 10.5 K/uL   RBC 4.39 3.87 - 5.11 Mil/uL   Hemoglobin 13.6 12.0 - 15.0 g/dL   HCT 61.4 43.1 - 54.0 %   MCV 91.8 78.0 - 100.0 fl   MCHC 33.9 30.0 - 36.0 g/dL   RDW 08.6 76.1 - 95.0 %   Platelets 272.0 150.0 - 400.0 K/uL   Neutrophils Relative % 59.6 43.0 - 77.0 %   Lymphocytes Relative 23.8 12.0 - 46.0 %   Monocytes Relative 10.0 3.0 - 12.0 %   Eosinophils Relative 5.7 (H) 0.0 - 5.0 %   Basophils Relative 0.9 0.0 - 3.0 %   Neutro Abs 4.6 1.4 - 7.7 K/uL   Lymphs Abs 1.8 0.7 - 4.0 K/uL   Monocytes Absolute 0.8 0.1 - 1.0 K/uL   Eosinophils Absolute 0.4 0.0 - 0.7 K/uL   Basophils Absolute 0.1 0.0 - 0.1 K/uL  Hemoglobin A1c  Result Value Ref Range   Hgb A1c MFr Bld 6.4 4.6 - 6.5 %  T4, free  Result Value Ref Range   Free T4 1.95 (H) 0.60 - 1.60 ng/dL  TSH  Result Value Ref Range   TSH 3.34 0.35 - 4.50 uIU/mL  Comprehensive metabolic panel  Result Value Ref Range   Sodium 140 135 - 145 mEq/L   Potassium 4.0 3.5 - 5.1 mEq/L   Chloride 105 96 - 112 mEq/L  CO2 28 19 - 32 mEq/L   Glucose, Bld 101 (H) 70 - 99 mg/dL   BUN 19 6 - 23 mg/dL   Creatinine, Ser 0.38 0.40 - 1.20 mg/dL   Total Bilirubin 0.6 0.2 - 1.2 mg/dL   Alkaline Phosphatase 92 39 - 117 U/L   AST 21 0 - 37 U/L   ALT 22 0 - 35 U/L   Total Protein 6.4 6.0 - 8.3 g/dL   Albumin 4.0 3.5 - 5.2 g/dL   GFR 33.38 (L) >32.91 mL/min   Calcium 9.4 8.4 - 10.5 mg/dL  Lipid panel  Result Value Ref  Range   Cholesterol 140 0 - 200 mg/dL   Triglycerides 916.6 (H) 0.0 - 149.0 mg/dL   HDL 06.00 >45.99 mg/dL   VLDL 77.4 0.0 - 14.2 mg/dL   LDL Cholesterol 54 0 - 99 mg/dL   Total CHOL/HDL Ratio 3    NonHDL 88.91    Assessment & Plan:  This visit occurred during the SARS-CoV-2 public health emergency.  Safety protocols were in place, including screening questions prior to the visit, additional usage of staff PPE, and extensive cleaning of exam room while observing appropriate contact time as indicated for disinfecting solutions.   Problem List Items Addressed This Visit    Subclinical hypothyroidism    Rpt TFTs today. Of note, she is on biotin.      Relevant Orders   TSH   T3   T4, free   Prediabetes    Reviewed A1c 6.4%. Encouraged low sugar/carb diet.       Osteopenia    Continue cal, vit D, weight bearing exercise.       HLD (hyperlipidemia)    Improved on statin, tolerating well - continue. The 10-year ASCVD risk score Denman George DC Montez Hageman., et al., 2013) is: 14.2%   Values used to calculate the score:     Age: 29 years     Sex: Female     Is Non-Hispanic African American: No     Diabetic: No     Tobacco smoker: No     Systolic Blood Pressure: 130 mmHg     Is BP treated: No     HDL Cholesterol: 51.2 mg/dL     Total Cholesterol: 140 mg/dL       Health maintenance examination - Primary    Preventative protocols reviewed and updated unless pt declined. Discussed healthy diet and lifestyle.       Hair loss    Progressive worsening over the last 3 months, she has even started using a wig. Already on biotin and MVI. She has changed shampoos. Update TFTs (recent fT4 high). Consider derm vs endo eval pending results.       Relevant Orders   TSH   T3   T4, free   Ex-smoker    Continue abstinence.       CKD (chronic kidney disease) stage 3, GFR 30-59 ml/min    Stable period.       Chronic obstructive pulmonary disease (HCC)    Chronic, stable on current regimen. No  recent COPD flares. Appreciate pulm care.      Aortic atherosclerosis (HCC)    Continue statin.       Advanced care planning/counseling discussion    Advanced directive discussion - packet previously provided - again today. HCPOA -likely [name] but not fully sure.          No orders of the defined types were placed in this encounter.  Orders Placed  This Encounter  Procedures  . TSH  . T3  . T4, free    Follow up plan: Return in about 1 year (around 08/03/2020) for medicare wellness visit, annual exam, prior fasting for blood work.  Ria Bush, MD

## 2019-08-04 NOTE — Assessment & Plan Note (Signed)
Continue abstinence

## 2019-08-04 NOTE — Assessment & Plan Note (Signed)
Continue statin. 

## 2019-08-04 NOTE — Assessment & Plan Note (Addendum)
Rpt TFTs today. Of note, she is on biotin.

## 2019-08-04 NOTE — Assessment & Plan Note (Signed)
Stable period.  

## 2019-08-04 NOTE — Assessment & Plan Note (Signed)
Preventative protocols reviewed and updated unless pt declined. Discussed healthy diet and lifestyle.  

## 2019-08-04 NOTE — Assessment & Plan Note (Signed)
Progressive worsening over the last 3 months, she has even started using a wig. Already on biotin and MVI. She has changed shampoos. Update TFTs (recent fT4 high). Consider derm vs endo eval pending results.

## 2019-08-04 NOTE — Assessment & Plan Note (Signed)
Continue cal, vit D, weight bearing exercise.

## 2019-08-04 NOTE — Assessment & Plan Note (Signed)
Improved on statin, tolerating well - continue. The 10-year ASCVD risk score Denman George DC Montez Hageman., et al., 2013) is: 14.2%   Values used to calculate the score:     Age: 74 years     Sex: Female     Is Non-Hispanic African American: No     Diabetic: No     Tobacco smoker: No     Systolic Blood Pressure: 130 mmHg     Is BP treated: No     HDL Cholesterol: 51.2 mg/dL     Total Cholesterol: 140 mg/dL

## 2019-08-04 NOTE — Assessment & Plan Note (Addendum)
Advanced directive discussion - packet previously provided - again today. HCPOA -likely [name] but not fully sure.

## 2019-08-05 LAB — T3: T3, Total: 133 ng/dL (ref 76–181)

## 2019-08-09 ENCOUNTER — Ambulatory Visit: Payer: Medicare HMO | Attending: Internal Medicine

## 2019-08-09 DIAGNOSIS — Z23 Encounter for immunization: Secondary | ICD-10-CM | POA: Insufficient documentation

## 2019-08-09 NOTE — Progress Notes (Signed)
   Covid-19 Vaccination Clinic  Name:  Stacey Durham    MRN: [id number] DOB: 04-Nov-1945  08/09/2019  Stacey Durham was observed post Covid-19 immunization for 15 minutes without incident. She was provided with Vaccine Information Sheet and instruction to access the V-Safe system.   Stacey Durham was instructed to call 911 with any severe reactions post vaccine: Marland Kitchen Difficulty breathing  . Swelling of face and throat  . A fast heartbeat  . A bad rash all over body  . Dizziness and weakness   Immunizations Administered    Name Date Dose VIS Date Route   Pfizer COVID-19 Vaccine 08/09/2019 12:32 PM 0.3 mL 05/12/2019 Intramuscular   Manufacturer: [employer], [employer]   Lot: [id number]   NDC: [address]-9

## 2019-08-27 DIAGNOSIS — J449 Chronic obstructive pulmonary disease, unspecified: Secondary | ICD-10-CM | POA: Diagnosis not present

## 2019-08-27 DIAGNOSIS — J411 Mucopurulent chronic bronchitis: Secondary | ICD-10-CM | POA: Diagnosis not present

## 2019-08-28 DIAGNOSIS — J411 Mucopurulent chronic bronchitis: Secondary | ICD-10-CM | POA: Diagnosis not present

## 2019-08-28 DIAGNOSIS — J449 Chronic obstructive pulmonary disease, unspecified: Secondary | ICD-10-CM | POA: Diagnosis not present

## 2019-09-11 ENCOUNTER — Telehealth: Payer: Self-pay | Admitting: Internal Medicine

## 2019-09-11 NOTE — Telephone Encounter (Signed)
LMTCB. Will close this message because is it one already open about this matter.

## 2019-09-11 NOTE — Telephone Encounter (Signed)
ATC pt, no answer. Left message for pt to call back.   I don't see a schedule for Dr. Belia Heman yet.

## 2019-09-11 NOTE — Telephone Encounter (Signed)
LMTCB x2  

## 2019-09-12 NOTE — Telephone Encounter (Signed)
LMTCB x3 for pt. We have attempted to contact pt several times with no success or call back from pt. Per triage protocol, message will be closed.   

## 2019-09-16 ENCOUNTER — Other Ambulatory Visit: Payer: Self-pay | Admitting: Family Medicine

## 2019-09-19 ENCOUNTER — Encounter: Payer: Self-pay | Admitting: Internal Medicine

## 2019-09-19 ENCOUNTER — Telehealth: Payer: Self-pay | Admitting: Internal Medicine

## 2019-09-19 ENCOUNTER — Other Ambulatory Visit: Payer: Self-pay

## 2019-09-19 ENCOUNTER — Ambulatory Visit (INDEPENDENT_AMBULATORY_CARE_PROVIDER_SITE_OTHER): Payer: Medicare HMO | Admitting: Internal Medicine

## 2019-09-19 VITALS — BP 112/72 | HR 95 | Temp 97.1°F | Ht 65.0 in | Wt 152.5 lb

## 2019-09-19 DIAGNOSIS — J441 Chronic obstructive pulmonary disease with (acute) exacerbation: Secondary | ICD-10-CM

## 2019-09-19 MED ORDER — AZITHROMYCIN 250 MG PO TABS: ORAL_TABLET | ORAL | 0 refills | Status: DC

## 2019-09-19 MED ORDER — PREDNISONE 20 MG PO TABS: 40.0000 mg | ORAL_TABLET | Freq: Every day | ORAL | 2 refills | Status: DC

## 2019-09-19 NOTE — Progress Notes (Signed)
[facility] Consultation     Date: 09/19/2019,   MRN# [id number] Stacey Durham Jun 07, 1945   CHIEF COMPLAINT:  Follow up COPD   HISTORY OF PRESENT ILLNESS   Continues to take Trelegy therapy Has been doing well with this regimen  At this time she has increased work of breathing and shortness of breath +COPD extubation at this time due to environmental exposure to pollen/dust   No  COPD exacerbation at this time No evidence of heart failure at this time No evidence or signs of infection at this time No respiratory distress No fevers, chills, nausea, vomiting, diarrhea No evidence of lower extremity edema No evidence hemoptysis  POST COVID 19 PNEUMONIA NOV 2020 Multiple ER visits    Current Medication:   Current Outpatient Medications:  .  albuterol (PROVENTIL) (2.5 MG/3ML) 0.083% nebulizer solution, Take 3 mLs (2.5 mg total) by nebulization every 4 (four) hours as needed for wheezing or shortness of breath., Disp: 300 mL, Rfl: 1 .  albuterol (VENTOLIN HFA) 108 (90 Base) MCG/ACT inhaler, INHALE 2 PUFFS INTO THE LUNGS EVERY 4 (FOUR) HOURS AS NEEDED FOR WHEEZING OR SHORTNESS OF BREATH., Disp: 18 Inhaler, Rfl: 6 .  atorvastatin (LIPITOR) 20 MG tablet, TAKE 1 TABLET BY MOUTH EVERY DAY, Disp: 90 tablet, Rfl: 3 .  Biotin 10000 MCG TABS, Take 1 tablet by mouth., Disp: , Rfl:  .  calcium-vitamin D (OSCAL WITH D) 500-200 MG-UNIT tablet, Take 1 tablet by mouth., Disp: , Rfl:  .  Dextromethorphan-Guaifenesin (MUCINEX DM PO), Take 1 tablet by mouth daily. , Disp: , Rfl:  .  fluticasone (FLONASE) 50 MCG/ACT nasal spray, SPRAY 2 SPRAYS INTO EACH NOSTRIL EVERY DAY, Disp: 48 mL, Rfl: 1 .  ipratropium-albuterol (DUONEB) 0.5-2.5 (3) MG/3ML SOLN, Take 3 mLs by nebulization every 4 (four) hours as needed., Disp: 360 mL, Rfl: 1 .  loperamide (IMODIUM A-D) 2 MG tablet, Take 1 tablet (2 mg total) by mouth daily as needed for diarrhea or loose stools., Disp: , Rfl: 0 .   montelukast (SINGULAIR) 10 MG tablet, TAKE 1 TABLET BY MOUTH EVERY DAY, Disp: 90 tablet, Rfl: 3 .  Multiple Vitamins-Minerals (MULTIVITAMIN ADULT PO), Take 1 tablet by mouth., Disp: , Rfl:  .  Probiotic Product (PROBIOTIC-10 PO), Take 1 tablet by mouth., Disp: , Rfl:  .  Respiratory Therapy Supplies (FLUTTER) DEVI, 1 each by Does not apply route once., Disp: 1 each, Rfl: 0 .  TRELEGY ELLIPTA 100-62.5-25 MCG/INH AEPB, TAKE 1 PUFF INTO THE LUNGS EVERY MORNING, Disp: 60 each, Rfl: 6 .  vitamin B-12 (CYANOCOBALAMIN) 1000 MCG tablet, Take 1,000 mcg by mouth daily., Disp: , Rfl:      ALLERGIES   Patient has no known allergies.     REVIEW OF SYSTEMS   Review of Systems  Constitutional: Negative for chills, fever, malaise/fatigue and weight loss.  HENT: Negative for congestion.   Respiratory: Positive for cough and wheezing. Negative for hemoptysis, sputum production and shortness of breath.        Chronic SOB/DOE at baseline  Cardiovascular: Negative for chest pain, palpitations, orthopnea and leg swelling.  All other systems reviewed and are negative.  BP 112/72 (BP Location: Left Arm, Cuff Size: Normal)   Pulse 95   Temp (!) 97.1 F (36.2 C) (Temporal)   Ht 5\' 5"  (1.651 m)   Wt 152 lb 8 oz (69.2 kg)   SpO2 91%   BMI 25.38 kg/m    PHYSICAL EXAM   Physical Exam  Constitutional: She is oriented to person, place, and time. She appears well-developed and well-nourished. She appears distressed.  HENT:  Mouth/Throat: No oropharyngeal exudate.  Eyes: EOM are normal. No scleral icterus.  Cardiovascular: Normal rate, regular rhythm and normal heart sounds.  No murmur heard. Pulmonary/Chest: No stridor. No respiratory distress. She has wheezes. She has rales.  Musculoskeletal:        General: No edema. Normal range of motion.  Neurological: She is alert and oriented to person, place, and time.  Skin: Skin is warm.  Psychiatric: She has a normal mood and affect.   CT chest Lung  cancer Screening: 08/2015 No nodules/massess PFT 08/2015 Ratio 52% fev1 1.1 46%  fvc 2.1 67%  WNL ONO WNL     ASSESSMENT/PLAN   Severe COPD based on pulmonary function testing Gold stage D Previous history of COPD exacerbation Continue Trelegy therapy inhaler therapy And albuterol as needed  Previous history chronic hypoxic respiratory failure  COPD Exacerbation Start prednisone 40 mg daily for 7 days Start Z-Pak  Chronic Hypoxic Resp failure Patient would like POC-DME companies do NOT have POC I have provided PRESCRIPTION for [employer] company   POST COVID 19 infection/Pneumonia Will need to consider CT chest to assess for ILD/fibrosis   COVID-19 EDUCATION: The signs and symptoms of COVID-19 were discussed with the patient and how to seek care for testing.  The importance of social distancing was discussed today. Hand Washing Techniques and avoid touching face was advised.  MEDICATION ADJUSTMENTS/LABS AND TESTS ORDERED: Prednisone 40 mg daily for 7 days Z-Pak Continue Trelegy as prescribed Continue oxygen as prescribed   CURRENT MEDICATIONS REVIEWED AT LENGTH WITH PATIENT TODAY   Patient satisfied with Plan of action and management. All questions answered Follow up  in 6 months   Total time spent 31 minutes  Lucie Leather, M.D.  [name]  Medical Director [facility] Medical Director [facility] Cardio-Pulmonary Department

## 2019-09-19 NOTE — Telephone Encounter (Signed)
Spoke with Melissa from [employer], they have the simple go which is on wheels, but they do not have the "[employer]" type POC.   ATC patient unable to reach. Left message to call back x1

## 2019-09-19 NOTE — Telephone Encounter (Signed)
Patient called office back. Informed me that [employer] has the POC for $2300. I let her know we don't have a walk within the 30 days to send the Rx to [employer], patient states she doesn't want to come in for a walk. Patient was given a hand Rx to take to [employer] if patient wanted to pay out of pocket at office visit today   Nothing further needed

## 2019-09-19 NOTE — Patient Instructions (Signed)
PREDNISONE 40 MG DAILY FOR 10 days Z pak Avoid triggers  Prescription for [employer] oxygen Concentrator provided

## 2019-09-27 DIAGNOSIS — J449 Chronic obstructive pulmonary disease, unspecified: Secondary | ICD-10-CM | POA: Diagnosis not present

## 2019-09-27 DIAGNOSIS — J411 Mucopurulent chronic bronchitis: Secondary | ICD-10-CM | POA: Diagnosis not present

## 2019-09-28 DIAGNOSIS — J411 Mucopurulent chronic bronchitis: Secondary | ICD-10-CM | POA: Diagnosis not present

## 2019-09-28 DIAGNOSIS — J449 Chronic obstructive pulmonary disease, unspecified: Secondary | ICD-10-CM | POA: Diagnosis not present

## 2019-10-03 DIAGNOSIS — H524 Presbyopia: Secondary | ICD-10-CM | POA: Diagnosis not present

## 2019-10-03 DIAGNOSIS — Z0100 Encounter for examination of eyes and vision without abnormal findings: Secondary | ICD-10-CM | POA: Diagnosis not present

## 2019-10-27 DIAGNOSIS — J411 Mucopurulent chronic bronchitis: Secondary | ICD-10-CM | POA: Diagnosis not present

## 2019-10-27 DIAGNOSIS — J449 Chronic obstructive pulmonary disease, unspecified: Secondary | ICD-10-CM | POA: Diagnosis not present

## 2019-10-28 DIAGNOSIS — J411 Mucopurulent chronic bronchitis: Secondary | ICD-10-CM | POA: Diagnosis not present

## 2019-10-28 DIAGNOSIS — J449 Chronic obstructive pulmonary disease, unspecified: Secondary | ICD-10-CM | POA: Diagnosis not present

## 2019-11-01 ENCOUNTER — Ambulatory Visit (INDEPENDENT_AMBULATORY_CARE_PROVIDER_SITE_OTHER): Payer: Medicare HMO | Admitting: Primary Care

## 2019-11-01 ENCOUNTER — Other Ambulatory Visit: Payer: Self-pay

## 2019-11-01 ENCOUNTER — Telehealth: Payer: Self-pay | Admitting: Primary Care

## 2019-11-01 ENCOUNTER — Other Ambulatory Visit
Admission: RE | Admit: 2019-11-01 | Discharge: 2019-11-01 | Disposition: A | Discharge status: Home / Self Care | Payer: Medicare HMO | Source: Ambulatory Visit | Attending: Primary Care | Admitting: Primary Care

## 2019-11-01 DIAGNOSIS — J441 Chronic obstructive pulmonary disease with (acute) exacerbation: Secondary | ICD-10-CM

## 2019-11-01 DIAGNOSIS — R0902 Hypoxemia: Secondary | ICD-10-CM | POA: Insufficient documentation

## 2019-11-01 DIAGNOSIS — R0602 Shortness of breath: Secondary | ICD-10-CM

## 2019-11-01 LAB — BASIC METABOLIC PANEL
Anion gap: 10 (ref 5–15)
BUN: 21 mg/dL (ref 8–23)
CO2: 25 mmol/L (ref 22–32)
Calcium: 9.2 mg/dL (ref 8.9–10.3)
Chloride: 103 mmol/L (ref 98–111)
Creatinine, Ser: 1.05 mg/dL — ABNORMAL HIGH (ref 0.44–1.00)
GFR calc Af Amer: >60 mL/min (ref >60)
GFR calc non Af Amer: 52 mL/min — ABNORMAL LOW (ref >60)
Glucose, Bld: 106 mg/dL — ABNORMAL HIGH (ref 70–99)
Potassium: 4.3 mmol/L (ref 3.5–5.1)
Sodium: 138 mmol/L (ref 135–145)

## 2019-11-01 NOTE — Patient Instructions (Signed)
-   Continue Trelegy 1 puff daily (rinse mouth after use); Ipratropium-albuterol every 4-6 hours as needed for shortness of breath/wheezing  - Needs CTA and BMET - Rx prednisone 20mg  x 5 days   Follow Up Instructions:  - Follow-up after testing; next fu due in October with Dr. November

## 2019-11-01 NOTE — Telephone Encounter (Signed)
Needs CTA to rule out PE. I would like this done in the next 1-2 days. Will need BMET. Hx COVID in November 2020.

## 2019-11-01 NOTE — Progress Notes (Signed)
Virtual Visit via Telephone Note  I connected with Stacey Durham on 11/01/19 at  2:00 PM EDT by telephone and verified that I am speaking with the correct person using two identifiers.  Location: Patient: Home Provider: Office   I discussed the limitations, risks, security and privacy concerns of performing an evaluation and management service by telephone and the availability of in person appointments. I also discussed with the patient that there may be a patient responsible charge related to this service. The patient expressed understanding and agreed to proceed.   History of Present Illness: 74 year old female, former smoker.  Past medical history significant for severe COPD GOLD D, Covid-19 pneumonia November 2020.  Of Dr. Belia Heman, last seen September 19, 2019 for COPD exacerbation treated with Z-Pak and prednisone 40 mg x 7 days. Maintained on Trelegy 1 puff daily and as needed albuterol.  Patient was given prescription for [employer].  Ordered CT chest to assess for ILD/fibrosis.  11/01/2019 Patient contacted for acute televisit. Reports increased shortness with associated back pain x 1 day. She also appreciated cramping in her hands. She has been riding her electric bike the last week which is not very stressful. She is compliant with Trelegy 1 puff daily, not rinsing her mouth after use. She has been needing to use  Albuterol nebulizer every 3-4 hours. She has not had CT chest done that was advised in April, does not appear this was ordered.  No known sick contacts. Denies fever, cough, chest tightness, leg swelling, N/V/D. She received both pfizer covid vaccines, last given in April 2021.    Observations/Objective:  Patient reported VS: - O2 88% on RA; 90-92% on 2L   Assessment and Plan:  COPD GOLD D: - Increased shortness of breath with associated back pain x 1 day. Denies cough or wheezing. Symptoms not classically consistent with acute exacerbation but will send in prescription for  short course of oral prednisone to see if there is any benefit in shortness of breath. No indication for abx at this time. Concern for PE vs covid-fibrosis  - Continue Trelegy 1 puff daily (rinse mouth after use); Ipratropium-albuterol q 4-6 hours prn shortness of breath/wheezing  - Needs CTA and BMET - Rx prednisone 20mg  x 5 days   Follow Up Instructions:  - Follow-up after testing; next fu due in October with Dr. November   I discussed the assessment and treatment plan with the patient. The patient was provided an opportunity to ask questions and all were answered. The patient agreed with the plan and demonstrated an understanding of the instructions.   The patient was advised to call back or seek an in-person evaluation if the symptoms worsen or if the condition fails to improve as anticipated.  I provided 25 minutes of non-face-to-face time during this encounter.   Belia Heman, NP

## 2019-11-01 NOTE — Telephone Encounter (Signed)
Called and spoke with pt letting her know that we had ordered a CT and labwork to be done ASAP and she verbalized understanding. Stated to pt that [facility] will contact her to let her know appt info. Nothing further needed.

## 2019-11-02 ENCOUNTER — Other Ambulatory Visit: Payer: Self-pay

## 2019-11-02 ENCOUNTER — Other Ambulatory Visit: Payer: Self-pay | Admitting: Primary Care

## 2019-11-02 ENCOUNTER — Ambulatory Visit
Admission: RE | Admit: 2019-11-02 | Discharge: 2019-11-02 | Disposition: A | Discharge status: Home / Self Care | Payer: Medicare HMO | Source: Ambulatory Visit | Attending: Primary Care | Admitting: Primary Care

## 2019-11-02 ENCOUNTER — Telehealth: Payer: Self-pay

## 2019-11-02 DIAGNOSIS — J449 Chronic obstructive pulmonary disease, unspecified: Secondary | ICD-10-CM

## 2019-11-02 DIAGNOSIS — R0602 Shortness of breath: Secondary | ICD-10-CM | POA: Diagnosis not present

## 2019-11-02 DIAGNOSIS — R0902 Hypoxemia: Secondary | ICD-10-CM | POA: Diagnosis present

## 2019-11-02 MED ORDER — IOHEXOL 350 MG/ML SOLN
75.0000 mL | Freq: Once | INTRAVENOUS | Status: AC | PRN
  Administered 2019-11-02: 75 mL via INTRAVENOUS

## 2019-11-02 NOTE — Progress Notes (Signed)
Pt notified of results. I ordered incentive spirometer through DME. She is asking exactly how long Beth rec that she use this for. Please advise thanks

## 2019-11-02 NOTE — Telephone Encounter (Signed)
Thanks

## 2019-11-02 NOTE — Telephone Encounter (Signed)
Call report: [facility]   IMPRESSION: 1. No pulmonary emboli. 2. Extensive emphysema. 3. Focal atelectasis at both lung bases superimposed on slight scarring at the left lung base. 4. Emphysema and aortic atherosclerosis.  Aortic Atherosclerosis (ICD10-I70.0) and Emphysema (ICD10-J43.9).   Electronically Signed   By: Francene Boyers M.D.   On: 11/02/2019 09:53

## 2019-11-02 NOTE — Progress Notes (Signed)
Please let patient know CT chest negative for PE. She has extensive emphysema and some atelectasis at lung bases. If she doesn't have an IS can we provide her with one to use 5-10 breathes an hour

## 2019-11-03 ENCOUNTER — Telehealth: Payer: Self-pay | Admitting: Primary Care

## 2019-11-03 MED ORDER — PREDNISONE 20 MG PO TABS: 20.0000 mg | ORAL_TABLET | Freq: Every day | ORAL | 0 refills | Status: DC

## 2019-11-03 NOTE — Telephone Encounter (Signed)
Advised pt of results. Pt stated that the prednisone was not called into the pharmacy. I sent her Rx to her pharmacy. Nothing further is needed.   Glenford Bayley, NP  11/02/2019 1:30 PM EDT    Please let patient know CT chest negative for PE. She has extensive emphysema and some atelectasis at lung bases. If she doesn't have an IS can we provide her with one to use 5-10 breathes an hour

## 2019-11-06 NOTE — Progress Notes (Signed)
I would use it three times a day for the next 2 weeks

## 2019-11-08 DIAGNOSIS — J9601 Acute respiratory failure with hypoxia: Secondary | ICD-10-CM | POA: Diagnosis not present

## 2019-11-08 DIAGNOSIS — J449 Chronic obstructive pulmonary disease, unspecified: Secondary | ICD-10-CM | POA: Diagnosis not present

## 2019-11-08 DIAGNOSIS — J411 Mucopurulent chronic bronchitis: Secondary | ICD-10-CM | POA: Diagnosis not present

## 2019-11-27 DIAGNOSIS — J411 Mucopurulent chronic bronchitis: Secondary | ICD-10-CM | POA: Diagnosis not present

## 2019-11-27 DIAGNOSIS — J449 Chronic obstructive pulmonary disease, unspecified: Secondary | ICD-10-CM | POA: Diagnosis not present

## 2019-11-28 DIAGNOSIS — J411 Mucopurulent chronic bronchitis: Secondary | ICD-10-CM | POA: Diagnosis not present

## 2019-11-28 DIAGNOSIS — J449 Chronic obstructive pulmonary disease, unspecified: Secondary | ICD-10-CM | POA: Diagnosis not present

## 2019-12-08 DIAGNOSIS — J411 Mucopurulent chronic bronchitis: Secondary | ICD-10-CM | POA: Diagnosis not present

## 2019-12-08 DIAGNOSIS — J449 Chronic obstructive pulmonary disease, unspecified: Secondary | ICD-10-CM | POA: Diagnosis not present

## 2019-12-08 DIAGNOSIS — J9601 Acute respiratory failure with hypoxia: Secondary | ICD-10-CM | POA: Diagnosis not present

## 2019-12-10 ENCOUNTER — Observation Stay: Payer: Medicare HMO

## 2019-12-10 ENCOUNTER — Emergency Department: Payer: Medicare HMO

## 2019-12-10 ENCOUNTER — Observation Stay
Admission: EM | Admit: 2019-12-10 | Discharge: 2019-12-11 | Disposition: A | Discharge status: Home / Self Care | Payer: Medicare HMO | Attending: Internal Medicine | Admitting: Internal Medicine

## 2019-12-10 ENCOUNTER — Other Ambulatory Visit: Payer: Self-pay

## 2019-12-10 DIAGNOSIS — I2510 Atherosclerotic heart disease of native coronary artery without angina pectoris: Secondary | ICD-10-CM | POA: Insufficient documentation

## 2019-12-10 DIAGNOSIS — J9621 Acute and chronic respiratory failure with hypoxia: Secondary | ICD-10-CM | POA: Diagnosis present

## 2019-12-10 DIAGNOSIS — J984 Other disorders of lung: Secondary | ICD-10-CM | POA: Diagnosis not present

## 2019-12-10 DIAGNOSIS — Z20822 Contact with and (suspected) exposure to covid-19: Secondary | ICD-10-CM | POA: Diagnosis not present

## 2019-12-10 DIAGNOSIS — I700 Atherosclerosis of aorta: Secondary | ICD-10-CM | POA: Diagnosis not present

## 2019-12-10 DIAGNOSIS — J439 Emphysema, unspecified: Secondary | ICD-10-CM | POA: Diagnosis not present

## 2019-12-10 DIAGNOSIS — R0602 Shortness of breath: Secondary | ICD-10-CM | POA: Diagnosis not present

## 2019-12-10 DIAGNOSIS — J9601 Acute respiratory failure with hypoxia: Secondary | ICD-10-CM | POA: Diagnosis not present

## 2019-12-10 DIAGNOSIS — J441 Chronic obstructive pulmonary disease with (acute) exacerbation: Secondary | ICD-10-CM | POA: Diagnosis present

## 2019-12-10 DIAGNOSIS — Z7951 Long term (current) use of inhaled steroids: Secondary | ICD-10-CM | POA: Diagnosis not present

## 2019-12-10 DIAGNOSIS — J90 Pleural effusion, not elsewhere classified: Secondary | ICD-10-CM | POA: Diagnosis not present

## 2019-12-10 DIAGNOSIS — Z87891 Personal history of nicotine dependence: Secondary | ICD-10-CM | POA: Diagnosis not present

## 2019-12-10 HISTORY — DX: Unspecified asthma, uncomplicated: J45.909

## 2019-12-10 LAB — URINALYSIS, COMPLETE (UACMP) WITH MICROSCOPIC
Bacteria, UA: NONE SEEN
Bilirubin Urine: NEGATIVE
Glucose, UA: NEGATIVE mg/dL
Hgb urine dipstick: NEGATIVE
Ketones, ur: NEGATIVE mg/dL
Leukocytes,Ua: NEGATIVE
Nitrite: NEGATIVE
Protein, ur: NEGATIVE mg/dL
Specific Gravity, Urine: 1.006 (ref 1.005–1.030)
pH: 7 (ref 5.0–8.0)

## 2019-12-10 LAB — COMPREHENSIVE METABOLIC PANEL
ALT: 22 U/L (ref 0–44)
AST: 29 U/L (ref 15–41)
Albumin: 3.7 g/dL (ref 3.5–5.0)
Alkaline Phosphatase: 85 U/L (ref 38–126)
Anion gap: 9 (ref 5–15)
BUN: 18 mg/dL (ref 8–23)
CO2: 28 mmol/L (ref 22–32)
Calcium: 8.7 mg/dL — ABNORMAL LOW (ref 8.9–10.3)
Chloride: 102 mmol/L (ref 98–111)
Creatinine, Ser: 1.04 mg/dL — ABNORMAL HIGH (ref 0.44–1.00)
GFR calc Af Amer: >60 mL/min (ref >60)
GFR calc non Af Amer: 53 mL/min — ABNORMAL LOW (ref >60)
Glucose, Bld: 135 mg/dL — ABNORMAL HIGH (ref 70–99)
Potassium: 3.9 mmol/L (ref 3.5–5.1)
Sodium: 139 mmol/L (ref 135–145)
Total Bilirubin: 0.8 mg/dL (ref 0.3–1.2)
Total Protein: 7.1 g/dL (ref 6.5–8.1)

## 2019-12-10 LAB — PROCALCITONIN: Procalcitonin: <0.1 ng/mL

## 2019-12-10 LAB — CBC WITH DIFFERENTIAL/PLATELET
Abs Immature Granulocytes: 0.02 10*3/uL (ref 0.00–0.07)
Basophils Absolute: 0 10*3/uL (ref 0.0–0.1)
Basophils Relative: 1 %
Eosinophils Absolute: 0.2 10*3/uL (ref 0.0–0.5)
Eosinophils Relative: 4 %
HCT: 39.6 % (ref 36.0–46.0)
Hemoglobin: 13.8 g/dL (ref 12.0–15.0)
Immature Granulocytes: 0 %
Lymphocytes Relative: 28 %
Lymphs Abs: 1.4 10*3/uL (ref 0.7–4.0)
MCH: 30.5 pg (ref 26.0–34.0)
MCHC: 34.8 g/dL (ref 30.0–36.0)
MCV: 87.6 fL (ref 80.0–100.0)
Monocytes Absolute: 0.4 10*3/uL (ref 0.1–1.0)
Monocytes Relative: 9 %
Neutro Abs: 2.8 10*3/uL (ref 1.7–7.7)
Neutrophils Relative %: 58 %
Platelets: 225 10*3/uL (ref 150–400)
RBC: 4.52 MIL/uL (ref 3.87–5.11)
RDW: 12.5 % (ref 11.5–15.5)
WBC: 4.8 10*3/uL (ref 4.0–10.5)
nRBC: 0 % (ref 0.0–0.2)

## 2019-12-10 LAB — BRAIN NATRIURETIC PEPTIDE: B Natriuretic Peptide: 30.6 pg/mL (ref 0.0–100.0)

## 2019-12-10 LAB — TROPONIN I (HIGH SENSITIVITY): Troponin I (High Sensitivity): 4 ng/L (ref <18)

## 2019-12-10 LAB — SARS CORONAVIRUS 2 BY RT PCR (HOSPITAL ORDER, PERFORMED IN ~~LOC~~ HOSPITAL LAB): SARS Coronavirus 2: NEGATIVE

## 2019-12-10 MED ORDER — IPRATROPIUM-ALBUTEROL 0.5-2.5 (3) MG/3ML IN SOLN
3.0000 mL | Freq: Once | RESPIRATORY_TRACT | Status: AC
  Administered 2019-12-10: 3 mL via RESPIRATORY_TRACT
  Filled 2019-12-10: qty 9

## 2019-12-10 MED ORDER — IPRATROPIUM-ALBUTEROL 0.5-2.5 (3) MG/3ML IN SOLN
3.0000 mL | Freq: Once | RESPIRATORY_TRACT | Status: AC
  Administered 2019-12-10: 3 mL via RESPIRATORY_TRACT

## 2019-12-10 MED ORDER — PREDNISONE 20 MG PO TABS
40.0000 mg | ORAL_TABLET | Freq: Every day | ORAL | Status: DC
  Administered 2019-12-11: 40 mg via ORAL
  Filled 2019-12-10: qty 2

## 2019-12-10 MED ORDER — MONTELUKAST SODIUM 10 MG PO TABS
10.0000 mg | ORAL_TABLET | Freq: Every day | ORAL | Status: DC
  Administered 2019-12-10 – 2019-12-11 (×2): 10 mg via ORAL
  Filled 2019-12-10 (×3): qty 1

## 2019-12-10 MED ORDER — SODIUM CHLORIDE 0.9% FLUSH
3.0000 mL | Freq: Two times a day (BID) | INTRAVENOUS | Status: DC
  Administered 2019-12-11: 09:00:00 3 mL via INTRAVENOUS

## 2019-12-10 MED ORDER — ENOXAPARIN SODIUM 40 MG/0.4ML ~~LOC~~ SOLN
40.0000 mg | SUBCUTANEOUS | Status: DC
  Administered 2019-12-10 – 2019-12-11 (×2): 40 mg via SUBCUTANEOUS
  Filled 2019-12-10 (×2): qty 0.4

## 2019-12-10 MED ORDER — FLUTICASONE-UMECLIDIN-VILANT 100-62.5-25 MCG/INH IN AEPB: INHALATION_SPRAY | Freq: Every day | RESPIRATORY_TRACT | Status: DC

## 2019-12-10 MED ORDER — METHYLPREDNISOLONE SODIUM SUCC 40 MG IJ SOLR
40.0000 mg | Freq: Two times a day (BID) | INTRAMUSCULAR | Status: AC
  Administered 2019-12-10 – 2019-12-11 (×2): 40 mg via INTRAVENOUS
  Filled 2019-12-10 (×2): qty 1

## 2019-12-10 MED ORDER — BENZONATATE 100 MG PO CAPS
100.0000 mg | ORAL_CAPSULE | Freq: Three times a day (TID) | ORAL | Status: DC
  Administered 2019-12-10 – 2019-12-11 (×4): 100 mg via ORAL
  Filled 2019-12-10 (×4): qty 1

## 2019-12-10 MED ORDER — IPRATROPIUM-ALBUTEROL 0.5-2.5 (3) MG/3ML IN SOLN: 3.0000 mL | RESPIRATORY_TRACT | Status: DC | PRN

## 2019-12-10 MED ORDER — RISAQUAD PO CAPS
1.0000 | ORAL_CAPSULE | Freq: Every day | ORAL | Status: DC
  Administered 2019-12-10 – 2019-12-11 (×2): 1 via ORAL
  Filled 2019-12-10 (×3): qty 1

## 2019-12-10 MED ORDER — CALCIUM CARBONATE-VITAMIN D 500-200 MG-UNIT PO TABS
1.0000 | ORAL_TABLET | Freq: Two times a day (BID) | ORAL | Status: DC
  Administered 2019-12-10 – 2019-12-11 (×2): 1 via ORAL
  Filled 2019-12-10 (×3): qty 1

## 2019-12-10 MED ORDER — SODIUM CHLORIDE 0.9% FLUSH: 3.0000 mL | INTRAVENOUS | Status: DC | PRN

## 2019-12-10 MED ORDER — ATORVASTATIN CALCIUM 20 MG PO TABS
20.0000 mg | ORAL_TABLET | Freq: Every day | ORAL | Status: DC
  Administered 2019-12-10 – 2019-12-11 (×2): 20 mg via ORAL
  Filled 2019-12-10 (×2): qty 1

## 2019-12-10 MED ORDER — METHYLPREDNISOLONE SODIUM SUCC 125 MG IJ SOLR
125.0000 mg | Freq: Once | INTRAMUSCULAR | Status: AC
  Administered 2019-12-10: 125 mg via INTRAVENOUS
  Filled 2019-12-10: qty 2

## 2019-12-10 MED ORDER — VITAMIN B-12 1000 MCG PO TABS
1000.0000 ug | ORAL_TABLET | Freq: Every day | ORAL | Status: DC
  Administered 2019-12-10 – 2019-12-11 (×2): 1000 ug via ORAL
  Filled 2019-12-10 (×3): qty 1

## 2019-12-10 MED ORDER — UMECLIDINIUM BROMIDE 62.5 MCG/INH IN AEPB
1.0000 | INHALATION_SPRAY | Freq: Every day | RESPIRATORY_TRACT | Status: DC
  Administered 2019-12-11: 09:00:00 1 via RESPIRATORY_TRACT
  Filled 2019-12-10 (×2): qty 7

## 2019-12-10 MED ORDER — FLUTICASONE FUROATE-VILANTEROL 100-25 MCG/INH IN AEPB
1.0000 | INHALATION_SPRAY | Freq: Every day | RESPIRATORY_TRACT | Status: DC
  Administered 2019-12-11: 1 via RESPIRATORY_TRACT
  Filled 2019-12-10 (×2): qty 28

## 2019-12-10 MED ORDER — IPRATROPIUM-ALBUTEROL 20-100 MCG/ACT IN AERS
1.0000 | INHALATION_SPRAY | RESPIRATORY_TRACT | Status: DC | PRN
  Filled 2019-12-10: qty 4

## 2019-12-10 MED ORDER — ADULT MULTIVITAMIN W/MINERALS CH
1.0000 | ORAL_TABLET | Freq: Every day | ORAL | Status: DC
  Administered 2019-12-10 – 2019-12-11 (×2): 1 via ORAL
  Filled 2019-12-10 (×2): qty 1

## 2019-12-10 MED ORDER — SODIUM CHLORIDE 0.9 % IV SOLN
250.0000 mL | INTRAVENOUS | Status: DC | PRN
  Administered 2019-12-10: 250 mL via INTRAVENOUS

## 2019-12-10 NOTE — ED Notes (Signed)
Pt provided water as requested 

## 2019-12-10 NOTE — ED Triage Notes (Addendum)
Pt arrives to ER with SOB x 1 week. States chronically PRN wears 2 L d/t COPD and asthma. Pt states she desatted at home into mid 80's on RA. Pt arrives with labored breathing and barely able to speak in complete sentences. States cough, non productive. Denies fever. States was around a Field seism[employer] and thinks she caught something there.

## 2019-12-10 NOTE — ED Notes (Signed)
Attempted to call report x2. Reports unable to take report at this time

## 2019-12-10 NOTE — ED Notes (Addendum)
See triage note, pt c/o SHOB, nasal congestion and not feeling well x1 week. Denies fever. States doesn't typically wear O2 at home, reports her SpO2 dropped to mid 80s and was needing to wear O2 at home today. Dyspnea at rest noted. Pt speaking in short sentences. Dry cough noted Alert and oriented

## 2019-12-10 NOTE — ED Notes (Signed)
Attempted to call report to floor, unable to take report at this time 

## 2019-12-10 NOTE — ED Notes (Signed)
Pt unable to provide urine sample at this time 

## 2019-12-10 NOTE — ED Notes (Signed)
Dr. Funke at bedside. 

## 2019-12-10 NOTE — ED Triage Notes (Signed)
First RN Note: pt presents to ED via POV with c/o severe SOB, pt on chronic 2L via [place] at this time. Pt with audible wheezing that can be heard while this RN standing next to patient. Pulse ox obtained by this RN after patient placed in wheelchair. Pt with severe dyspnea and respiratory distress upon arrival to ED, unable to speak in full sentences upon arrival.   Jae Dire, RN took patient back to room 10 immediately for triage.

## 2019-12-10 NOTE — ED Notes (Addendum)
Pt placed on purewick d/t ambulating SpO2 mid 80s while ambulating to bathroom on 3L [place]

## 2019-12-10 NOTE — H&P (Addendum)
History and Physical    Stacey Durham ZHY:[id number] DOB: [birth year] DOA: 12/10/2019  PCP: Eustaquio Boyden, MD   Patient coming from: Home  I have personally briefly reviewed patient's old medical records in [facility]  Chief Complaint: Shortness of breath  HPI: Stacey Durham is a 74 y.o. female with medical history significant for COPD with chronic respiratory failure on 2 L of oxygen at night who presents to the emergency room for evaluation of worsening shortness of breath which initially started with exertion but now occurs at rest associated with wheezing and a dry cough.  Patient was unable to speak in full sentences upon arrival to the ER.  Per patient she had a room air pulse oximetry in the mid 80s at home and is currently on 3 L of oxygen to maintain pulse oximetry greater than 92%.  She denies having any fever or chills.  She denies having any sick contacts but states that she was in a biker meeting and thinks she may have caught something there. Is having any lower extremity swelling, she has no fever, she denies having any chest pain, no headaches, no abdominal pain or any changes in her bowel habits. Patient received her COVID-19 vaccination Chest x-ray reviewed by me shows haziness in the lung fields.  Read by radiologist as small left pleural effusion. Increased interstitial markings in the bases concerning for pulmonary venous congestion/mild edema.  Twelve-lead EKG reviewed by me shows sinus rhythm   ED Course: Patient is a 74 year old Caucasian female with a history of COPD and chronic respiratory failure on 2 L of oxygen at night who presents to the ER for evaluation of worsening shortness of breath, wheezing and hypoxia.  She is currently on 3 L of oxygen to maintain pulse oximetry greater than 92%.  She will be admitted to the hospital for further evaluation.  Review of Systems: As per HPI otherwise 10 point review of systems negative.    Past Medical  History:  Diagnosis Date  . Acute cholecystitis   . Asthma   . Cholecystitis with cholelithiasis 10/11/2015  . COPD (chronic obstructive pulmonary disease) (HCC)   . Ex-smoker    quit 2000s  . Pneumonia 11/26/2015    Past Surgical History:  Procedure Laterality Date  . ABDOMINAL HYSTERECTOMY  1978   part of an ovary remained  . APPENDECTOMY  1978  . CATARACT EXTRACTION, BILATERAL  2013  . CHOLECYSTECTOMY N/A 10/11/2015   Procedure: LAPAROSCOPIC CHOLECYSTECTOMY;  Surgeon: Leafy Ro, MD;  Location: [facility] ORS;  Service: General;  Laterality: N/A;  . COLONOSCOPY  07/2016   HP, ext hem, diverticulosis, rpt 5 yrs (Outlaw)  . TONSILLECTOMY       reports that she quit smoking about 18 years ago. Her smoking use included cigarettes. She started smoking about 57 years ago. She has a 50.00 pack-year smoking history. She has never used smokeless tobacco. She reports that she does not drink alcohol and does not use drugs.  No Known Allergies  Family History  Problem Relation Age of Onset  . Heart attack Father 68  . Diabetes Father   . Diabetes Mother   . Stroke Neg Hx   . Cancer Neg Hx      Prior to Admission medications   Medication Sig Start Date End Date Taking? Authorizing Provider  albuterol (PROVENTIL) (2.5 MG/3ML) 0.083% nebulizer solution Take 3 mLs (2.5 mg total) by nebulization every 4 (four) hours as needed for wheezing or shortness of  breath. 06/29/18   Eustaquio Boyden, MD  albuterol (VENTOLIN HFA) 108 (90 Base) MCG/ACT inhaler INHALE 2 PUFFS INTO THE LUNGS EVERY 4 (FOUR) HOURS AS NEEDED FOR WHEEZING OR SHORTNESS OF BREATH. 10/13/18   Erin Fulling, MD  atorvastatin (LIPITOR) 20 MG tablet TAKE 1 TABLET BY MOUTH EVERY DAY 08/04/19   Eustaquio Boyden, MD  Biotin 20355 MCG TABS Take 1 tablet by mouth.    [provider]  calcium-vitamin D (OSCAL WITH D) 500-200 MG-UNIT tablet Take 1 tablet by mouth.    [provider]  Dextromethorphan-Guaifenesin (MUCINEX DM  PO) Take 1 tablet by mouth daily.     [provider]  ipratropium-albuterol (DUONEB) 0.5-2.5 (3) MG/3ML SOLN Take 3 mLs by nebulization every 4 (four) hours as needed. 08/08/18   Erin Fulling, MD  loperamide (IMODIUM A-D) 2 MG tablet Take 1 tablet (2 mg total) by mouth daily as needed for diarrhea or loose stools. 07/28/18   Eustaquio Boyden, MD  montelukast (SINGULAIR) 10 MG tablet TAKE 1 TABLET BY MOUTH EVERY DAY 09/16/19   Eustaquio Boyden, MD  Multiple Vitamins-Minerals (MULTIVITAMIN ADULT PO) Take 1 tablet by mouth.    [provider]  predniSONE (DELTASONE) 20 MG tablet Take 1 tablet (20 mg total) by mouth daily with breakfast. 11/03/19   Glenford Bayley, NP  Probiotic Product (PROBIOTIC-10 PO) Take 1 tablet by mouth.    [provider]  Respiratory Therapy Supplies (FLUTTER) DEVI 1 each by Does not apply route once. 11/26/15   Mungal, Eston Esters, MD  TRELEGY ELLIPTA 100-62.5-25 MCG/INH AEPB TAKE 1 PUFF INTO THE LUNGS EVERY MORNING 07/31/19   Erin Fulling, MD  vitamin B-12 (CYANOCOBALAMIN) 1000 MCG tablet Take 1,000 mcg by mouth daily.    [provider]    Physical Exam: Vitals:   12/10/19 1231 12/10/19 1311 12/10/19 1330 12/10/19 1500  BP: 123/78 134/71 123/81 107/72  Pulse: 79 94 93 99  Resp: 14 18 16    Temp:      TempSrc:      SpO2:      Weight:      Height:         Vitals:   12/10/19 1231 12/10/19 1311 12/10/19 1330 12/10/19 1500  BP: 123/78 134/71 123/81 107/72  Pulse: 79 94 93 99  Resp: 14 18 16    Temp:      TempSrc:      SpO2:      Weight:      Height:        Constitutional: NAD, alert and oriented x 3 Eyes: PERRL, lids and conjunctivae normal ENMT: Mucous membranes are moist.  Neck: normal, supple, no masses, no thyromegaly Respiratory: Diffuse wheezing, no crackles. Normal respiratory effort. No accessory muscle use.  Cardiovascular: Regular rate and rhythm, no murmurs / rubs / gallops. No extremity edema. 2+ pedal pulses. No  carotid bruits.  Abdomen: no tenderness, no masses palpated. No hepatosplenomegaly. Bowel sounds positive.  Musculoskeletal: no clubbing / cyanosis. No joint deformity upper and lower extremities.  Skin: no rashes, lesions, ulcers.  Neurologic: No gross focal neurologic deficit. Psychiatric: Normal mood and affect.   Labs on Admission: I have personally reviewed following labs and imaging studies  CBC: Recent Labs  Lab 12/10/19 1149  WBC 4.8  NEUTROABS 2.8  HGB 13.8  HCT 39.6  MCV 87.6  PLT 225   Basic Metabolic Panel: Recent Labs  Lab 12/10/19 1149  NA 139  K 3.9  CL 102  CO2 28  GLUCOSE  135*  BUN 18  CREATININE 1.04*  CALCIUM 8.7*   GFR: Estimated Creatinine Clearance: 42.7 mL/min (A) (by C-G formula based on SCr of 1.04 mg/dL (H)). Liver Function Tests: Recent Labs  Lab 12/10/19 1149  AST 29  ALT 22  ALKPHOS 85  BILITOT 0.8  PROT 7.1  ALBUMIN 3.7   No results for input(s): LIPASE, AMYLASE in the last 168 hours. No results for input(s): AMMONIA in the last 168 hours. Coagulation Profile: No results for input(s): INR, PROTIME in the last 168 hours. Cardiac Enzymes: No results for input(s): CKTOTAL, CKMB, CKMBINDEX, TROPONINI in the last 168 hours. BNP (last 3 results) No results for input(s): PROBNP in the last 8760 hours. HbA1C: No results for input(s): HGBA1C in the last 72 hours. CBG: No results for input(s): GLUCAP in the last 168 hours. Lipid Profile: No results for input(s): CHOL, HDL, LDLCALC, TRIG, CHOLHDL, LDLDIRECT in the last 72 hours. Thyroid Function Tests: No results for input(s): TSH, T4TOTAL, FREET4, T3FREE, THYROIDAB in the last 72 hours. Anemia Panel: No results for input(s): VITAMINB12, FOLATE, FERRITIN, TIBC, IRON, RETICCTPCT in the last 72 hours. Urine analysis:    Component Value Date/Time   COLORURINE YELLOW (A) 12/10/2019 1311   APPEARANCEUR CLEAR (A) 12/10/2019 1311   LABSPEC 1.006 12/10/2019 1311   PHURINE 7.0  12/10/2019 1311   GLUCOSEU NEGATIVE 12/10/2019 1311   HGBUR NEGATIVE 12/10/2019 1311   BILIRUBINUR NEGATIVE 12/10/2019 1311   KETONESUR NEGATIVE 12/10/2019 1311   PROTEINUR NEGATIVE 12/10/2019 1311   NITRITE NEGATIVE 12/10/2019 1311   LEUKOCYTESUR NEGATIVE 12/10/2019 1311    Radiological Exams on Admission: DG Chest Portable 1 View  Result Date: 12/10/2019 CLINICAL DATA:  Shortness of breath EXAM: PORTABLE CHEST 1 VIEW COMPARISON:  April 03, 2019 FINDINGS: Small left pleural effusion. Increased interstitial markings in the bases, improved in the interval. The cardiomediastinal silhouette is stable. No pneumothorax. No other acute abnormalities. IMPRESSION: Small left pleural effusion. Increased interstitial markings in the bases concerning for pulmonary venous congestion/mild edema. No other acute abnormalities. Electronically Signed   By: Gerome Samavid  Williams III M.D   On: 12/10/2019 12:31    EKG: Independently reviewed. Sinus rhythm  Assessment/Plan Active Problems:   COPD with acute exacerbation (HCC)   Acute on chronic respiratory failure with hypoxia (HCC)   COPD with acute exacerbation Patient with a known history of COPD and chronic respiratory failure on 2 L of oxygen at night who presents to the ER for evaluation of worsening shortness of breath associated with a dry cough and diffuse wheezing as well as hypoxia (room air pulse oximetry was in the mid 80s) Place patient on systemic and inhaled steroids Continue as needed scheduled and PRN bronchodilator therapy Will obtain 2D echocardiogram to assess LVEF Patient will need to be assessed for continuous home O2 need prior to discharge   Acute on chronic respiratory failure Patient has a history of chronic respiratory failure and is on 2 L of oxygen at night Patient noted to have hypoxia, with room air pulse oximetry in the mid 80s at rest which improved with oxygen supplementation at 3 L We will attempt to wean patient off  oxygen as tolerated She may have a new oxygen requirement will need to be assessed for continuous home oxygen need prior to discharge   DVT prophylaxis: Lovenox Code Status: Full code Family Communication: Greater than 50% of time was spent discussing plan of care with patient and her husband at the bedside.  All questions and  concerns have been addressed.  They verbalized understanding and agree with the plan. Disposition Plan: Back to previous home environment Consults called: None    Alanea Woolridge MD [facility]     12/10/2019, 3:40 PM

## 2019-12-10 NOTE — ED Provider Notes (Signed)
[facility] Emergency Department Provider Note  ____________________________________________   First MD Initiated Contact with Patient 12/10/19 1137     (approximate)  I have reviewed the triage vital signs and the nursing notes.   HISTORY  Chief Complaint Shortness of Breath    HPI Stacey Durham is a 74 y.o. female with COPD who is on 2 L of oxygen as needed who comes in with worsening shortness of breath and congestion.  Patient reports not feeling well for the past 1 week.  She states that she has had some nasal congestion.  She states that her shortness of breath has been getting worse and now more severe today, constant, nothing makes it better, worse with exertion.  Denies any fevers.  Has had her Covid vaccines.  Denies any other risk factors for PE and had a recent CT PE that was negative.          Past Medical History:  Diagnosis Date  . Acute cholecystitis   . Asthma   . Cholecystitis with cholelithiasis 10/11/2015  . COPD (chronic obstructive pulmonary disease) (HCC)   . Ex-smoker    quit 2000s  . Pneumonia 11/26/2015    Patient Active Problem List   Diagnosis Date Noted  . CKD (chronic kidney disease) stage 3, GFR 30-59 ml/min 07/28/2018  . Chronic diarrhea 05/10/2018  . Chronic respiratory failure with hypoxia (HCC) 01/29/2018  . Osteopenia 09/25/2017  . Pulmonary nodule 02/02/2017  . Neck pain 01/30/2017  . Health maintenance examination 06/26/2016  . Advanced care planning/counseling discussion 06/26/2016  . Incontinence of feces with fecal urgency 06/26/2016  . HLD (hyperlipidemia) 06/26/2016  . Prediabetes 06/26/2016  . Infected epidermoid cyst 01/23/2016  . Hair loss 01/07/2016  . Subclinical hypothyroidism 01/07/2016  . Aortic atherosclerosis (HCC) 01/07/2016  . CAD (coronary artery disease) 01/07/2016  . COPD exacerbation (HCC) 11/26/2015  . Ex-smoker 09/25/2015  . Chronic obstructive pulmonary disease (HCC)  08/16/2015    Past Surgical History:  Procedure Laterality Date  . ABDOMINAL HYSTERECTOMY  1978   part of an ovary remained  . APPENDECTOMY  1978  . CATARACT EXTRACTION, BILATERAL  2013  . CHOLECYSTECTOMY N/A 10/11/2015   Procedure: LAPAROSCOPIC CHOLECYSTECTOMY;  Surgeon: Leafy Roiego F Pabon, MD;  Location: [facility] ORS;  Service: General;  Laterality: N/A;  . COLONOSCOPY  07/2016   HP, ext hem, diverticulosis, rpt 5 yrs (Outlaw)  . TONSILLECTOMY      Prior to Admission medications   Medication Sig Start Date End Date Taking? Authorizing Provider  albuterol (PROVENTIL) (2.5 MG/3ML) 0.083% nebulizer solution Take 3 mLs (2.5 mg total) by nebulization every 4 (four) hours as needed for wheezing or shortness of breath. 06/29/18   Eustaquio BoydenGutierrez, Javier, MD  albuterol (VENTOLIN HFA) 108 (90 Base) MCG/ACT inhaler INHALE 2 PUFFS INTO THE LUNGS EVERY 4 (FOUR) HOURS AS NEEDED FOR WHEEZING OR SHORTNESS OF BREATH. 10/13/18   Erin FullingKasa, Kurian, MD  atorvastatin (LIPITOR) 20 MG tablet TAKE 1 TABLET BY MOUTH EVERY DAY 08/04/19   Eustaquio BoydenGutierrez, Javier, MD  Biotin [id number] MCG TABS Take 1 tablet by mouth.    [provider]  calcium-vitamin D (OSCAL WITH D) 500-200 MG-UNIT tablet Take 1 tablet by mouth.    [provider]  Dextromethorphan-Guaifenesin (MUCINEX DM PO) Take 1 tablet by mouth daily.     [provider]  fluticasone (FLONASE) 50 MCG/ACT nasal spray SPRAY 2 SPRAYS INTO EACH NOSTRIL EVERY DAY 02/17/19   Eustaquio BoydenGutierrez, Javier, MD  ipratropium-albuterol (DUONEB) 0.5-2.5 (3)  MG/3ML SOLN Take 3 mLs by nebulization every 4 (four) hours as needed. 08/08/18   Erin Fulling, MD  loperamide (IMODIUM A-D) 2 MG tablet Take 1 tablet (2 mg total) by mouth daily as needed for diarrhea or loose stools. 07/28/18   Eustaquio Boyden, MD  montelukast (SINGULAIR) 10 MG tablet TAKE 1 TABLET BY MOUTH EVERY DAY 09/16/19   Eustaquio Boyden, MD  Multiple Vitamins-Minerals (MULTIVITAMIN ADULT PO) Take 1 tablet by mouth.     [provider]  predniSONE (DELTASONE) 20 MG tablet Take 1 tablet (20 mg total) by mouth daily with breakfast. 11/03/19   Glenford Bayley, NP  Probiotic Product (PROBIOTIC-10 PO) Take 1 tablet by mouth.    [provider]  Respiratory Therapy Supplies (FLUTTER) DEVI 1 each by Does not apply route once. 11/26/15   Mungal, Eston Esters, MD  TRELEGY ELLIPTA 100-62.5-25 MCG/INH AEPB TAKE 1 PUFF INTO THE LUNGS EVERY MORNING 07/31/19   Erin Fulling, MD  vitamin B-12 (CYANOCOBALAMIN) 1000 MCG tablet Take 1,000 mcg by mouth daily.    [provider]    Allergies Patient has no known allergies.  Family History  Problem Relation Age of Onset  . Heart attack Father 74  . Diabetes Father   . Diabetes Mother   . Stroke Neg Hx   . Cancer Neg Hx     Social History Social History   Tobacco Use  . Smoking status: Former Smoker    Packs/day: 1.25    Years: 40.00    Pack years: 50.00    Types: Cigarettes    Start date: 1964    Quit date: 2003    Years since quitting: 18.5  . Smokeless tobacco: Never Used  Vaping Use  . Vaping Use: Never used  Substance Use Topics  . Alcohol use: No  . Drug use: No      Review of Systems Constitutional: No fever/chills Eyes: No visual changes. ENT: No sore throat.  Congestion Cardiovascular: No chest pain Respiratory: Positive for SOB Gastrointestinal: No abdominal pain.  No nausea, no vomiting.  No diarrhea.  No constipation. Genitourinary: Negative for dysuria. Musculoskeletal: Negative for back pain. Skin: Negative for rash. Neurological: Negative for headaches, focal weakness or numbness. All other ROS negative ____________________________________________   PHYSICAL EXAM:  VITAL SIGNS: ED Triage Vitals  Enc Vitals Group     BP 12/10/19 1139 (!) 146/93     Pulse Rate 12/10/19 1133 (!) 105     Resp 12/10/19 1133 (!) 36     Temp --      Temp src --      SpO2 12/10/19 1133 95 %     Weight 12/10/19 1138 150 lb (68  kg)     Height 12/10/19 1138 5\' 5"  (1.651 m)     Head Circumference --      Peak Flow --      Pain Score 12/10/19 1137 0     Pain Loc --      Pain Edu? --      Excl. in GC? --     Constitutional: Alert and oriented. Well appearing and in no acute distress. Eyes: Conjunctivae are normal. EOMI. Head: Atraumatic. Nose: No congestion/rhinnorhea. Mouth/Throat: Mucous membranes are moist.   Neck: No stridor. Trachea Midline. FROM Cardiovascular: Normal rate, regular rhythm. Grossly normal heart sounds.  Good peripheral circulation. Respiratory: Wheezing bilaterally, mild increased work of breathing, on 2 to 4 L Gastrointestinal: Soft and nontender. No distention. No abdominal bruits.  Musculoskeletal: No  lower extremity tenderness nor edema.  No joint effusions. Neurologic:  Normal speech and language. No gross focal neurologic deficits are appreciated.  Skin:  Skin is warm, dry and intact. No rash noted. Psychiatric: Mood and affect are normal. Speech and behavior are normal. GU: Deferred   ____________________________________________   LABS (all labs ordered are listed, but only abnormal results are displayed)  Labs Reviewed  COMPREHENSIVE METABOLIC PANEL - Abnormal; Notable for the following components:      Result Value   Glucose, Bld 135 (*)    Creatinine, Ser 1.04 (*)    Calcium 8.7 (*)    GFR calc non Af Amer 53 (*)    All other components within normal limits  URINALYSIS, COMPLETE (UACMP) WITH MICROSCOPIC - Abnormal; Notable for the following components:   Color, Urine YELLOW (*)    APPearance CLEAR (*)    All other components within normal limits  SARS CORONAVIRUS 2 BY RT PCR (HOSPITAL ORDER, PERFORMED IN [facility])  CBC WITH DIFFERENTIAL/PLATELET  BRAIN NATRIURETIC PEPTIDE  PROCALCITONIN  HIV ANTIBODY (ROUTINE TESTING W REFLEX)  TROPONIN I (HIGH SENSITIVITY)   ____________________________________________   ED ECG REPORT I, Concha Se, the  attending physician, personally viewed and interpreted this ECG.  Normal sinus rate of 77, no ST ovation, no T wave inversions, normal intervals ____________________________________________  RADIOLOGY   Official radiology report(s): DG Chest Portable 1 View  Result Date: 12/10/2019 CLINICAL DATA:  Shortness of breath EXAM: PORTABLE CHEST 1 VIEW COMPARISON:  April 03, 2019 FINDINGS: Small left pleural effusion. Increased interstitial markings in the bases, improved in the interval. The cardiomediastinal silhouette is stable. No pneumothorax. No other acute abnormalities. IMPRESSION: Small left pleural effusion. Increased interstitial markings in the bases concerning for pulmonary venous congestion/mild edema. No other acute abnormalities. Electronically Signed   By: Gerome Sam III M.D   On: 12/10/2019 12:31    ____________________________________________   PROCEDURES  Procedure(s) performed (including Critical Care):  .Critical Care Performed by: Concha Se, MD Authorized by: Concha Se, MD   Critical care provider statement:    Critical care time (minutes):  35   Critical care was necessary to treat or prevent imminent or life-threatening deterioration of the following conditions:  Respiratory failure   Critical care was time spent personally by me on the following activities:  Discussions with consultants, evaluation of patient's response to treatment, examination of patient, ordering and performing treatments and interventions, ordering and review of laboratory studies, ordering and review of radiographic studies, pulse oximetry, re-evaluation of patient's condition, obtaining history from patient or surrogate and review of old charts     ____________________________________________   INITIAL IMPRESSION / ASSESSMENT AND PLAN / ED COURSE   Stacey Durham was evaluated in Emergency Department on 12/10/2019 for the symptoms described in the history of present  illness. She was evaluated in the context of the global COVID-19 pandemic, which necessitated consideration that the patient might be at risk for infection with the SARS-CoV-2 virus that causes COVID-19. Institutional protocols and algorithms that pertain to the evaluation of patients at risk for COVID-19 are in a state of rapid change based on information released by regulatory bodies including the CDC and federal and state organizations. These policies and algorithms were followed during the patient's care in the ED.     Pt presents with SOB.  Suspect this is most likely related to COPD given the diffuse wheezing.  Will give duo nebs and steroids.  Differential includes: PNA-will get xray to evaluation Anemia-CBC to evaluate ACS- will get trops Arrhythmia-Will get EKG and keep on monitor.  COVID- will get testing per algorithm. PE-lower suspicion given no risk factors and other cause more likely  Labs are reassuring.  No troponin or BNP elevation to suggest ACS or demand ischemia.  Patient x-ray was concerning for possibly a pulmonary edema which I do not think is happening given her BNP is negative she had no swelling her legs.  We discussed repeat CT imaging to rule out PE but again I think her suspicion is very low at this time it seems to be more likely related to her COPD so we will hold off.  Given patient is now requiring 4 L of oxygen will discuss to the hospital team for admission for her COPD  ____________________________________________   FINAL CLINICAL IMPRESSION(S) / ED DIAGNOSES   Final diagnoses:  Acute respiratory failure with hypoxia (HCC)  COPD exacerbation (HCC)     MEDICATIONS GIVEN DURING THIS VISIT:  Medications  atorvastatin (LIPITOR) tablet 20 mg (has no administration in time range)  Probiotic-10 CHEW (has no administration in time range)  vitamin B-12 (CYANOCOBALAMIN) tablet 1,000 mcg (has no administration in time range)  calcium-vitamin D (OSCAL WITH D)  500-200 MG-UNIT per tablet 1 tablet (has no administration in time range)  Multivitamin Adult TABS (has no administration in time range)  montelukast (SINGULAIR) tablet 10 mg (has no administration in time range)  Fluticasone-Umeclidin-Vilant 100-62.5-25 MCG/INH AEPB (has no administration in time range)  enoxaparin (LOVENOX) injection 40 mg (has no administration in time range)  sodium chloride flush (NS) 0.9 % injection 3 mL (has no administration in time range)  sodium chloride flush (NS) 0.9 % injection 3 mL (has no administration in time range)  0.9 %  sodium chloride infusion (has no administration in time range)  methylPREDNISolone sodium succinate (SOLU-MEDROL) 40 mg/mL injection 40 mg (has no administration in time range)    Followed by  predniSONE (DELTASONE) tablet 40 mg (has no administration in time range)  benzonatate (TESSALON) capsule 100 mg (has no administration in time range)  Ipratropium-Albuterol (COMBIVENT) respimat 1 puff (has no administration in time range)  ipratropium-albuterol (DUONEB) 0.5-2.5 (3) MG/3ML nebulizer solution 3 mL (3 mLs Nebulization Given 12/10/19 1211)  ipratropium-albuterol (DUONEB) 0.5-2.5 (3) MG/3ML nebulizer solution 3 mL (3 mLs Nebulization Given 12/10/19 1211)  ipratropium-albuterol (DUONEB) 0.5-2.5 (3) MG/3ML nebulizer solution 3 mL (3 mLs Nebulization Given 12/10/19 1211)  methylPREDNISolone sodium succinate (SOLU-MEDROL) 125 mg/2 mL injection 125 mg (125 mg Intravenous Given 12/10/19 1209)     ED Discharge Orders    None       Note:  This document was prepared using Dragon voice recognition software and may include unintentional dictation errors.   Concha Se, MD 12/10/19 [phone]

## 2019-12-10 NOTE — ED Notes (Signed)
Pt increased to 3L [place] to maintain SpO2 > 92%. Dr Fuller Plan aware

## 2019-12-11 ENCOUNTER — Observation Stay
Admit: 2019-12-11 | Discharge: 2019-12-11 | Disposition: A | Discharge status: Home / Self Care | Payer: Medicare HMO | Attending: Internal Medicine | Admitting: Internal Medicine

## 2019-12-11 DIAGNOSIS — J9621 Acute and chronic respiratory failure with hypoxia: Secondary | ICD-10-CM | POA: Diagnosis not present

## 2019-12-11 DIAGNOSIS — I5031 Acute diastolic (congestive) heart failure: Secondary | ICD-10-CM | POA: Diagnosis not present

## 2019-12-11 DIAGNOSIS — J441 Chronic obstructive pulmonary disease with (acute) exacerbation: Secondary | ICD-10-CM | POA: Diagnosis not present

## 2019-12-11 LAB — HIV ANTIBODY (ROUTINE TESTING W REFLEX): HIV Screen 4th Generation wRfx: NONREACTIVE

## 2019-12-11 LAB — PROCALCITONIN: Procalcitonin: <0.1 ng/mL

## 2019-12-11 MED ORDER — IPRATROPIUM-ALBUTEROL 0.5-2.5 (3) MG/3ML IN SOLN: 3.0000 mL | RESPIRATORY_TRACT | Status: DC | PRN

## 2019-12-11 MED ORDER — PREDNISONE 20 MG PO TABS: 40.0000 mg | ORAL_TABLET | Freq: Every day | ORAL | 0 refills | Status: AC

## 2019-12-11 MED ORDER — ENSURE ENLIVE PO LIQD
237.0000 mL | Freq: Two times a day (BID) | ORAL | Status: DC
  Administered 2019-12-11 (×2): 237 mL via ORAL

## 2019-12-11 NOTE — Care Management Obs Status (Signed)
MEDICARE OBSERVATION STATUS NOTIFICATION   Patient Details  Name: FINESSE FIELDER MRN: [id number] Date of Birth: [birth year]   Medicare Observation Status Notification Given:  Yes    Allayne Butcher, RN 12/11/2019, 11:00 AM

## 2019-12-11 NOTE — TOC Initial Note (Signed)
Transition of Care [facility]) - Initial/Assessment Note    Patient Details  Name: Stacey Durham MRN: [id number] Date of Birth: [birth year]  [facility] CM/SW Contact:    Allayne Butcher, RN Phone Number: 12/11/2019, 12:27 PM  Clinical Narrative:                 RNCM introduced self and explained role.  Patient is sitting up in bed eating her lunch, significant other is at the bedside.  Patient was placed in observation for COPD exacerbation, this RNCM gave patient MOON notice.  Patient is not happy about being in observation because of the copay associated with Medicare part B coverage.   Patient reports that she is from home and is very active and independent.  Patient has oxygen but only uses it as needed.  Patient will need continuous oxygen upon discharge home.  Zach with [employer] contacted about setting this up.  Because patient is so active she would like a small portable concentrator.  Patient is current with PCP and pulmonologist.  Patient will need to follow up with her pulmonologist after discharge.   Expected Discharge Plan: Home/Self Care Barriers to Discharge: Continued Medical Work up   Patient Goals and CMS Choice Patient states their goals for this hospitalization and ongoing recovery are:: wants to know what prescription she will be going home with to help with her breathing CMS Medicare.gov Compare Post Acute Care list provided to:: Patient Choice offered to / list presented to : Patient  Expected Discharge Plan and Services Expected Discharge Plan: Home/Self Care   Discharge Planning Services: CM Consult Post Acute Care Choice: Durable Medical Equipment Living arrangements for the past 2 months: Single Family Home                 DME Arranged: Oxygen DME Agency: [employer] Date DME Agency Contacted: 12/11/19 Time DME Agency Contacted: 1222 Representative spoke with at DME Agency: Oletha Cruel            Prior Living Arrangements/Services Living  arrangements for the past 2 months: Single Family Home Lives with:: Significant Other Patient language and need for interpreter reviewed:: Yes Do you feel safe going back to the place where you live?: Yes      Need for Family Participation in Patient Care: Yes (Comment) (COPD) Care giver support system in place?: Yes (comment) Current home services: DME (oxygen at home only at night) Criminal Activity/Legal Involvement Pertinent to Current Situation/Hospitalization: No - Comment as needed  Activities of Daily Living Home Assistive Devices/Equipment: Oxygen, Blood pressure cuff ADL Screening (condition at time of admission) Patient's cognitive ability adequate to safely complete daily activities?: Yes Is the patient deaf or have difficulty hearing?: No Does the patient have difficulty seeing, even when wearing glasses/contacts?: No Does the patient have difficulty concentrating, remembering, or making decisions?: No Patient able to express need for assistance with ADLs?: Yes Does the patient have difficulty dressing or bathing?: No Independently performs ADLs?: Yes (appropriate for developmental age) Does the patient have difficulty walking or climbing stairs?: No Weakness of Legs: None Weakness of Arms/Hands: None  Permission Sought/Granted Permission sought to share information with : Family Supports, Case Manager, Other (comment) Permission granted to share information with : Yes, Verbal Permission Granted     Permission granted to share info w AGENCY: [employer]        Emotional Assessment Appearance:: Appears stated age Attitude/Demeanor/Rapport: Engaged, Complaining Affect (typically observed): Angry Orientation: : Oriented to Self, Oriented  to Place, Oriented to  Time, Oriented to Situation Alcohol / Substance Use: Not Applicable Psych Involvement: No (comment)  Admission diagnosis:  COPD exacerbation (HCC) [J44.1] Acute respiratory failure with hypoxia (HCC) [J96.01] COPD  with acute exacerbation (HCC) [J44.1] Patient Active Problem List   Diagnosis Date Noted  . COPD with acute exacerbation (HCC) 12/10/2019  . Acute on chronic respiratory failure with hypoxia (HCC) 12/10/2019  . CKD (chronic kidney disease) stage 3, GFR 30-59 ml/min 07/28/2018  . Chronic diarrhea 05/10/2018  . Chronic respiratory failure with hypoxia (HCC) 01/29/2018  . Osteopenia 09/25/2017  . Pulmonary nodule 02/02/2017  . Neck pain 01/30/2017  . Health maintenance examination 06/26/2016  . Advanced care planning/counseling discussion 06/26/2016  . Incontinence of feces with fecal urgency 06/26/2016  . HLD (hyperlipidemia) 06/26/2016  . Prediabetes 06/26/2016  . Infected epidermoid cyst 01/23/2016  . Hair loss 01/07/2016  . Subclinical hypothyroidism 01/07/2016  . Aortic atherosclerosis (HCC) 01/07/2016  . CAD (coronary artery disease) 01/07/2016  . COPD exacerbation (HCC) 11/26/2015  . Ex-smoker 09/25/2015  . Chronic obstructive pulmonary disease (HCC) 08/16/2015   PCP:  Eustaquio Boyden, MD Pharmacy:   [phone] [address] [place] - [address] [place] [address] Phone: [phone] Fax: [phone]  [facility] [place] [place] - [address] [place] [address] Phone: [phone] Fax: [phone]     Social Determinants of Health (SDOH) Interventions    Readmission Risk Interventions No flowsheet data found.

## 2019-12-11 NOTE — Progress Notes (Signed)
Home Oxygen Qualification Note  SaO2 on room air at rest = 89% SaO2 on room air while ambulating = 86% SaO2 on 2 liters of O2 while ambulating = 91%  Olga Coaster PT, DPT 11:19 AM,12/11/19

## 2019-12-11 NOTE — Progress Notes (Signed)
IV removed before discharge. Went over discharge instructions with patient and patients significant other. They stated that understood and all questions were answered. Patient going home with significant other POV.

## 2019-12-11 NOTE — Progress Notes (Signed)
SATURATION QUALIFICATIONS: (This note is used to comply with regulatory documentation for home oxygen)  Patient Saturations on Room Air at Rest =  88%  Patient Saturations on Room Air while Ambulating = 86%  Patient Saturations on 2 Liters of oxygen while Ambulating = 90%  Please briefly explain why patient needs home oxygen: Patient needs home oxygen because her O2 saturation drops to 88% and below.

## 2019-12-11 NOTE — Evaluation (Signed)
Physical Therapy Evaluation Patient Details Name: Stacey Durham MRN: [id number] DOB: Jun 26, 1945 Today's Date: 12/11/2019   History of Present Illness  Per MD note: Stacey Durham is a 74 y.o. female with medical history significant for COPD with chronic respiratory failure on 2 L of oxygen at night who presents to the emergency room for evaluation of worsening shortness of breath which initially started with exertion but now occurs at rest associated with wheezing and a dry cough. Chest x-ray reviewed by me shows haziness in the lung fields.  Read by radiologist as small left pleural effusion.    Clinical Impression  Pt alert, oriented, up in chair at start of session. Reported at baseline she is independent, lives with her significant other. Stated she uses her oxygen at home as needed.  The patient demonstrated all mobility with supervision/I. She was able to transfer and ambulate ~232ft without AD. She exhibited fatigue and slight decrease in gait velocity over time, educated on importance of activity tolerance, pacing, and PLB (pt did exhibit SOB with mobility). One very small LOB pt corrected independently. spO2 monitored throughout, needed 2L to maintain saturation levels with mobility. The patient demonstrated and reported very near return to baseline level of functioning, no further acute PT needs indicated. PT to sign off. Please reconsult PT if pt status changes or acute needs are identified.      Follow Up Recommendations No PT follow up    Equipment Recommendations  None recommended by PT    Recommendations for Other Services       Precautions / Restrictions Precautions Precautions: Fall Precaution Comments: monitor O2-pt drops on RA to ~88% and then with activity to ~86%. Needs 2Lnc to sustain >90% with activity or at rest. Restrictions Weight Bearing Restrictions: No      Mobility  Bed Mobility Overal bed mobility: Independent             General bed  mobility comments: pt up in chair, per OT note, independent  Transfers Overall transfer level: Independent               General transfer comment: steady, safe  Ambulation/Gait Ambulation/Gait assistance: Supervision Gait Distance (Feet): 200 Feet Assistive device: None Gait Pattern/deviations: WFL(Within Functional Limits)   Gait velocity interpretation: >2.62 ft/sec, indicative of community ambulatory General Gait Details: Pt with fatigue exhibiting mild decrease in gait velocity. one small LOB noted pt able to correct independently. Stated her balance has been different since having covid.  Stairs            Wheelchair Mobility    Modified Rankin (Stroke Patients Only)       Balance Overall balance assessment: Independent                                           Pertinent Vitals/Pain Pain Assessment: No/denies pain    Home Living Family/patient expects to be discharged to:: Private residence Living Arrangements: Spouse/significant other Available Help at Discharge: Friend(s);Available 24 hours/day Type of Home: Mobile home Home Access: Stairs to enter Entrance Stairs-Rails: Right Entrance Stairs-Number of Steps: 3 Home Layout: One level Home Equipment: Shower seat Additional Comments: does not use shower seat    Prior Function Level of Independence: Independent         Comments: Ind with Amb and all aspects of self care, no AD or DME use. Drives, rides  motorcycles, and pedals an electric trike with her dog riding in the back.     Hand Dominance   Dominant Hand: Right    Extremity/Trunk Assessment   Upper Extremity Assessment Upper Extremity Assessment: Defer to OT evaluation    Lower Extremity Assessment Lower Extremity Assessment: Overall WFL for tasks assessed    Cervical / Trunk Assessment Cervical / Trunk Assessment: Normal  Communication   Communication: No difficulties  Cognition Arousal/Alertness:  Awake/alert Behavior During Therapy: WFL for tasks assessed/performed Overall Cognitive Status: Within Functional Limits for tasks assessed                                        General Comments General comments (skin integrity, edema, etc.): Demos G standing balance, G-/F+ balance with fxl mobility, reports balance has been slightly off since having COVID    Exercises Other Exercises Other Exercises: SPO2 monitored throughout; trialed on room air, unable to mobilize and maintain sats- dropped to 86%. Placed on 2L and ranged 89-92% while ambulating.   Assessment/Plan    PT Assessment Patent does not need any further PT services  PT Problem List         PT Treatment Interventions      PT Goals (Current goals can be found in the Care Plan section)  Acute Rehab PT Goals Patient Stated Goal: to go home and get my energy back    Frequency     Barriers to discharge        Co-evaluation               AM-PAC PT "6 Clicks" Mobility  Outcome Measure Help needed turning from your back to your side while in a flat bed without using bedrails?: None Help needed moving from lying on your back to sitting on the side of a flat bed without using bedrails?: None Help needed moving to and from a bed to a chair (including a wheelchair)?: None Help needed standing up from a chair using your arms (e.g., wheelchair or bedside chair)?: None Help needed to walk in hospital room?: None Help needed climbing 3-5 steps with a railing? : None 6 Click Score: 24    End of Session Equipment Utilized During Treatment: Oxygen (2L) Activity Tolerance: Patient tolerated treatment well Patient left: in bed;with call bell/phone within reach Nurse Communication: Mobility status;Other (comment) PT Visit Diagnosis: Other abnormalities of gait and mobility (R26.89)    Time: 7829-5621 PT Time Calculation (min) (ACUTE ONLY): 21 min   Charges:   PT Evaluation $PT Eval Low  Complexity: 1 Low PT Treatments $Therapeutic Exercise: 8-22 mins       Olga Coaster PT, DPT 11:17 AM,12/11/19

## 2019-12-11 NOTE — Progress Notes (Signed)
*  PRELIMINARY RESULTS* Echocardiogram 2D Echocardiogram has been performed.  Stacey Durham 12/11/2019, 11:12 AM

## 2019-12-11 NOTE — Discharge Summary (Signed)
Physician Discharge Summary  Stacey Durham CZY:[id number] DOB: [birth year] DOA: 12/10/2019  PCP: Eustaquio Boyden, MD  Admit date: 12/10/2019 Discharge date: 12/11/2019  Admitted From: Home Disposition: Home   Recommendations for Outpatient Follow-up:  1. Follow up with PCP in 1-2 weeks 2. Follow-up with pulmonologist. 3. Please obtain BMP/CBC in one week 4. Please follow up on the following pending results:None  Home Health: No Equipment/Devices: Home oxygen Discharge Condition: Stable CODE STATUS:  Diet recommendation: Heart Healthy   Brief/Interim Summary: Stacey Durham is a 74 y.o. female with medical history significant for COPD with chronic respiratory failure on 2 L of oxygen at night who presents to the emergency room for evaluation of worsening shortness of breath which initially started with exertion but now occurs at rest associated with wheezing and a dry cough.  Patient denies any sick contacts but thinks that she catches something in a bikers meeting.  Patient did received COVID-19 vaccination.  COVID-19 test was negative.  Chest x-ray with small left pleural effusion and some increased interstitial markings.  BNP was normal.  CT chest with severe emphysema, bibasilar scarring and atelectasis.  Procalcitonin was within normal limit.  Echocardiogram was normal.  Patient remained afebrile with no leukocytosis. Patient was on 2 L at home which she was using it mostly while sleeping.  Patient drops in high 80s on room air with ambulation which improves quickly with 2 L.  She was advised to use 2 L all the time and will follow up with her pulmonologist for further management.  She was also given a 4-day course of prednisone. PT also evaluated her and they do not have any further recommendations.  She will continue with her home medications and follow-up with primary care physician.  Discharge Diagnoses:  Active Problems:   COPD with acute exacerbation (HCC)   Acute on  chronic respiratory failure with hypoxia [facility])   Discharge Instructions  Discharge Instructions    Diet - low sodium heart healthy   Complete by: As directed    Discharge instructions   Complete by: As directed    It was pleasure taking care of you. It looks like you are having little worsening of your COPD.  You are being given prednisone for 4 days and use your oxygen 2 L all the time.  You need to follow-up with your pulmonologist for further management.   Increase activity slowly   Complete by: As directed      Allergies as of 12/11/2019   No Known Allergies     Medication List    TAKE these medications   albuterol (2.5 MG/3ML) 0.083% nebulizer solution Commonly known as: PROVENTIL Take 3 mLs (2.5 mg total) by nebulization every 4 (four) hours as needed for wheezing or shortness of breath.   albuterol 108 (90 Base) MCG/ACT inhaler Commonly known as: VENTOLIN HFA INHALE 2 PUFFS INTO THE LUNGS EVERY 4 (FOUR) HOURS AS NEEDED FOR WHEEZING OR SHORTNESS OF BREATH.   atorvastatin 20 MG tablet Commonly known as: LIPITOR TAKE 1 TABLET BY MOUTH EVERY DAY   Biotin 09323 MCG Tabs Take 10 mg by mouth daily.   calcium-vitamin D 500-200 MG-UNIT tablet Commonly known as: OSCAL WITH D Take 1 tablet by mouth.   ipratropium-albuterol 0.5-2.5 (3) MG/3ML Soln Commonly known as: DUONEB Take 3 mLs by nebulization every 4 (four) hours as needed.   loperamide 2 MG tablet Commonly known as: Imodium A-D Take 1 tablet (2 mg total) by mouth daily as needed for  diarrhea or loose stools.   montelukast 10 MG tablet Commonly known as: SINGULAIR TAKE 1 TABLET BY MOUTH EVERY DAY   MUCINEX DM PO Take 1 tablet by mouth daily.   MULTIVITAMIN ADULT PO Take 1 tablet by mouth.   predniSONE 20 MG tablet Commonly known as: DELTASONE Take 2 tablets (40 mg total) by mouth daily with breakfast for 4 days. Start taking on: December 12, 2019   PROBIOTIC-10 PO Take 1 tablet by mouth.   Trelegy  Ellipta 100-62.5-25 MCG/INH Aepb Generic drug: Fluticasone-Umeclidin-Vilant TAKE 1 PUFF INTO THE LUNGS EVERY MORNING What changed: See the new instructions.   vitamin B-12 1000 MCG tablet Commonly known as: CYANOCOBALAMIN Take 1,000 mcg by mouth daily.            Durable Medical Equipment  (From admission, onward)         Start     Ordered   12/11/19 1353  For home use only DME oxygen  Once       Question Answer Comment  Length of Need Lifetime   Liters per Minute 2   Frequency Continuous (stationary and portable oxygen unit needed)   Oxygen conserving device Yes   Oxygen delivery system Gas      12/11/19 1353          Follow-up Information    Eustaquio BoydenGutierrez, Javier, MD. Schedule an appointment as soon as possible for a visit.   Specialty: Family Medicine Contact information: [address] [place] [address] [phone]              No Known Allergies  Consultations:  None  Procedures/Studies: CT CHEST WO CONTRAST  Result Date: 12/10/2019 CLINICAL DATA:  Short of breath for 1 week, COPD, asthma EXAM: CT CHEST WITHOUT CONTRAST TECHNIQUE: Multidetector CT imaging of the chest was performed following the standard protocol without IV contrast. COMPARISON:  11/02/2019, 12/10/2019 FINDINGS: Cardiovascular: Unenhanced imaging of the heart and great vessels demonstrates no pericardial effusion. Stable atherosclerosis of the thoracic aorta and coronary vessels. Mediastinum/Nodes: No enlarged mediastinal or axillary lymph nodes. Thyroid gland, trachea, and esophagus demonstrate no significant findings. Lungs/Pleura: Severe emphysema, upper lobe predominant, stable. Scattered areas of scarring are seen at the lung bases. There is subsegmental atelectasis within the left lower lobe, likely accounting for the chest x-ray finding. 7 mm ground-glass nodule in the right upper lobe reference image 75 is new since prior exam performed 1 month previous, consistent with  focal inflammation or hypoventilatory change. Upper Abdomen: No acute abnormality. Musculoskeletal: No acute or destructive bony lesions. Reconstructed images demonstrate no additional findings. IMPRESSION: 1. Severe emphysema, with focal area of inflammatory change in the right upper lobe as above. 2. Bibasilar scarring and atelectasis, likely accounting for chest x-ray finding. 3. Otherwise no acute intrathoracic process. 4. Aortic Atherosclerosis (ICD10-I70.0) and Emphysema (ICD10-J43.9). Electronically Signed   By: Sharlet SalinaMichael  Brown M.D.   On: 12/10/2019 16:01   DG Chest Portable 1 View  Result Date: 12/10/2019 CLINICAL DATA:  Shortness of breath EXAM: PORTABLE CHEST 1 VIEW COMPARISON:  April 03, 2019 FINDINGS: Small left pleural effusion. Increased interstitial markings in the bases, improved in the interval. The cardiomediastinal silhouette is stable. No pneumothorax. No other acute abnormalities. IMPRESSION: Small left pleural effusion. Increased interstitial markings in the bases concerning for pulmonary venous congestion/mild edema. No other acute abnormalities. Electronically Signed   By: Gerome Samavid  Williams III M.D   On: 12/10/2019 12:31     Subjective: Patient has no new complaints  today.  Continues to have some cough but thinks that she got some cold exposure.  She was using oxygen just at that time.  She would like to get a smaller equipment to use oxygen with ambulation.  Discharge Exam: Vitals:   12/11/19 1104 12/11/19 1540  BP: 105/66 133/72  Pulse: 89 80  Resp: 17 17  Temp: 98.1 F (36.7 C) 97.8 F (36.6 C)  SpO2: 91% 94%   Vitals:   12/11/19 0451 12/11/19 0728 12/11/19 1104 12/11/19 1540  BP: 134/69 137/66 105/66 133/72  Pulse: 81 81 89 80  Resp: 18 17 17 17   Temp: 97.7 F (36.5 C) 97.8 F (36.6 C) 98.1 F (36.7 C) 97.8 F (36.6 C)  TempSrc: Oral Oral  Oral  SpO2: 93% 94% 91% 94%  Weight:      Height:        General: Pt is alert, awake, not in acute  distress Cardiovascular: RRR, S1/S2 +, no rubs, no gallops Respiratory: CTA bilaterally, no wheezing, no rhonchi Abdominal: Soft, NT, ND, bowel sounds + Extremities: no edema, no cyanosis   The results of significant diagnostics from this hospitalization (including imaging, microbiology, ancillary and laboratory) are listed below for reference.    Microbiology: Recent Results (from the past 240 hour(s))  SARS Coronavirus 2 by RT PCR (hospital order, performed in [facility] hospital lab) Nasopharyngeal Nasopharyngeal Swab     Status: None   Collection Time: 12/10/19 11:49 AM   Specimen: Nasopharyngeal Swab  Result Value Ref Range Status   SARS Coronavirus 2 NEGATIVE NEGATIVE Final    Comment: (NOTE) SARS-CoV-2 target nucleic acids are NOT DETECTED.  The SARS-CoV-2 RNA is generally detectable in upper and lower respiratory specimens during the acute phase of infection. The lowest concentration of SARS-CoV-2 viral copies this assay can detect is 250 copies / mL. A negative result does not preclude SARS-CoV-2 infection and should not be used as the sole basis for treatment or other patient management decisions.  A negative result may occur with improper specimen collection / handling, submission of specimen other than nasopharyngeal swab, presence of viral mutation(s) within the areas targeted by this assay, and inadequate number of viral copies (<250 copies / mL). A negative result must be combined with clinical observations, patient history, and epidemiological information.  Fact Sheet for Patients:   02/10/20  Fact Sheet for Healthcare Providers: BoilerBrush.com.cy  This test is not yet approved or  cleared by the https://pope.com/ FDA and has been authorized for detection and/or diagnosis of SARS-CoV-2 by FDA under an Emergency Use Authorization (EUA).  This EUA will remain in effect (meaning this test can be used) for the  duration of the COVID-19 declaration under Section 564(b)(1) of the Act, 21 U.S.C. section 360bbb-3(b)(1), unless the authorization is terminated or revoked sooner.  Performed at [facility], [address] [place], [place]      Labs: BNP (last 3 results) Recent Labs    04/03/19 1637 12/10/19 1149  BNP 41.0 30.6   Basic Metabolic Panel: Recent Labs  Lab 12/10/19 1149  NA 139  K 3.9  CL 102  CO2 28  GLUCOSE 135*  BUN 18  CREATININE 1.04*  CALCIUM 8.7*   Liver Function Tests: Recent Labs  Lab 12/10/19 1149  AST 29  ALT 22  ALKPHOS 85  BILITOT 0.8  PROT 7.1  ALBUMIN 3.7   No results for input(s): LIPASE, AMYLASE in the last 168 hours. No results for input(s): AMMONIA in  the last 168 hours. CBC: Recent Labs  Lab 12/10/19 1149  WBC 4.8  NEUTROABS 2.8  HGB 13.8  HCT 39.6  MCV 87.6  PLT 225   Cardiac Enzymes: No results for input(s): CKTOTAL, CKMB, CKMBINDEX, TROPONINI in the last 168 hours. BNP: Invalid input(s): POCBNP CBG: No results for input(s): GLUCAP in the last 168 hours. D-Dimer No results for input(s): DDIMER in the last 72 hours. Hgb A1c No results for input(s): HGBA1C in the last 72 hours. Lipid Profile No results for input(s): CHOL, HDL, LDLCALC, TRIG, CHOLHDL, LDLDIRECT in the last 72 hours. Thyroid function studies No results for input(s): TSH, T4TOTAL, T3FREE, THYROIDAB in the last 72 hours.  Invalid input(s): FREET3 Anemia work up No results for input(s): VITAMINB12, FOLATE, FERRITIN, TIBC, IRON, RETICCTPCT in the last 72 hours. Urinalysis    Component Value Date/Time   COLORURINE YELLOW (A) 12/10/2019 1311   APPEARANCEUR CLEAR (A) 12/10/2019 1311   LABSPEC 1.006 12/10/2019 1311   PHURINE 7.0 12/10/2019 1311   GLUCOSEU NEGATIVE 12/10/2019 1311   HGBUR NEGATIVE 12/10/2019 1311   BILIRUBINUR NEGATIVE 12/10/2019 1311   KETONESUR NEGATIVE 12/10/2019 1311   PROTEINUR NEGATIVE 12/10/2019 1311   NITRITE  NEGATIVE 12/10/2019 1311   LEUKOCYTESUR NEGATIVE 12/10/2019 1311   Sepsis Labs Invalid input(s): PROCALCITONIN,  WBC,  LACTICIDVEN Microbiology Recent Results (from the past 240 hour(s))  SARS Coronavirus 2 by RT PCR (hospital order, performed in [facility] hospital lab) Nasopharyngeal Nasopharyngeal Swab     Status: None   Collection Time: 12/10/19 11:49 AM   Specimen: Nasopharyngeal Swab  Result Value Ref Range Status   SARS Coronavirus 2 NEGATIVE NEGATIVE Final    Comment: (NOTE) SARS-CoV-2 target nucleic acids are NOT DETECTED.  The SARS-CoV-2 RNA is generally detectable in upper and lower respiratory specimens during the acute phase of infection. The lowest concentration of SARS-CoV-2 viral copies this assay can detect is 250 copies / mL. A negative result does not preclude SARS-CoV-2 infection and should not be used as the sole basis for treatment or other patient management decisions.  A negative result may occur with improper specimen collection / handling, submission of specimen other than nasopharyngeal swab, presence of viral mutation(s) within the areas targeted by this assay, and inadequate number of viral copies (<250 copies / mL). A negative result must be combined with clinical observations, patient history, and epidemiological information.  Fact Sheet for Patients:   BoilerBrush.com.cy  Fact Sheet for Healthcare Providers: https://pope.com/  This test is not yet approved or  cleared by the [place] FDA and has been authorized for detection and/or diagnosis of SARS-CoV-2 by FDA under an Emergency Use Authorization (EUA).  This EUA will remain in effect (meaning this test can be used) for the duration of the COVID-19 declaration under Section 564(b)(1) of the Act, 21 U.S.C. section 360bbb-3(b)(1), unless the authorization is terminated or revoked sooner.  Performed at [facility], [address], [place], [place] [address]     Time coordinating discharge: Over 30 minutes  SIGNED:  Arnetha Courser, MD  [facility] 12/11/2019, 4:42 PM  If 7PM-7AM, please contact night-coverage [email or web address]  This record has been created using Conservation officer, historic buildings. Errors have been sought and corrected,but may not always be located. Such creation errors do not reflect on the standard of care.

## 2019-12-11 NOTE — Evaluation (Signed)
Occupational Therapy Evaluation Patient Details Name: Stacey Durham MRN: [id number] DOB: 08-06-1945 Today's Date: 12/11/2019    History of Present Illness Per MD note: Stacey Durham is a 74 y.o. female with medical history significant for COPD with chronic respiratory failure on 2 L of oxygen at night who presents to the emergency room for evaluation of worsening shortness of breath which initially started with exertion but now occurs at rest associated with wheezing and a dry cough. Chest x-ray reviewed by me shows haziness in the lung fields.  Read by radiologist as small left pleural effusion.   Clinical Impression   Pt was seen for OT evaluation this date. Prior to hospital admission, pt was Indep with all ADLs/IADLs and fxl mobility. Pt lives in mobile home with her SO with 3-4 STE with R railing. Currently pt demonstrates impairments as described below (See OT problem list) which functionally limit her ability to perform ADL/self-care tasks. Pt not requiring extensive assistance with any aspects of self care or mobility versus her baseline, just demos decreased fxl activity tolerance impacted by decreased O2 status. Pt able to compelte UB/LB bathing and dressing with MOD I (sitting as needed d/t fatigue), Supv provided d/t nature of evaluation, but pt with no difficulties. Dome decreased dynamic standing balance, barely notable-only when pt fatigues which she endorses has been her new normal since having COVID . O2 monitored throughout session, pt is at 93% on 4Lnc when OT presents, reduced to 2Lnc and saturation stable ~91-92%, in standing and with marching on 2L, she maintains ~90%. On RA at rest, pt's O2 drops to ~87-88% and with activity drops to 86%, RN notified. Overall, pt does not appear to have continued acute OT needs and do not anticipate need for OT f/u after hospitalization. She could potentially benefit from pulmonary rehab follow up after d/c from acute setting, but OT  to sign off at this time as pt as at her functional baseline.     Follow Up Recommendations  No OT follow up;Other (comment) (could potentially benefit from f/u with pulmonary rehab.)    Equipment Recommendations  None recommended by OT    Recommendations for Other Services       Precautions / Restrictions Precautions Precautions: Fall Precaution Comments: monitor O2-pt drops on RA to ~88% and then with activity to ~86%. Needs 2Lnc to sustain >90% with activity or at rest. Restrictions Weight Bearing Restrictions: No      Mobility Bed Mobility Overal bed mobility: Independent                Transfers Overall transfer level: Independent               General transfer comment: O2 set at 4Lnc when OT enters room, pt noted to be satting ~93%. When dropped to her standard 2Lnc (used only at night intermittently at home), sustains 90-92% at rest. In standing and with marching, sustains >90% on 2Lnc. O2 in static standing on RA at 88%, with standing activity, drops to 86%. Pt has her own spO2 monitor that she uses at home and states this is not her normal.    Balance Overall balance assessment: Mild deficits observed, not formally tested                                         ADL either performed or assessed with clinical judgement  ADL Overall ADL's : Modified independent;At baseline                                       General ADL Comments: able to perform seated and standing UB/LB ADLs with no AD with Supv only. Pt does have slightly decreased dyanmic standing balance for low reaching which she reports has been true since she had COVID .     Vision Baseline Vision/History: Wears glasses Wears Glasses: Reading only Patient Visual Report: No change from baseline       Perception     Praxis      Pertinent Vitals/Pain Pain Assessment: No/denies pain     Hand Dominance Right   Extremity/Trunk Assessment Upper  Extremity Assessment Upper Extremity Assessment: Defer to OT evaluation   Lower Extremity Assessment Lower Extremity Assessment: Overall WFL for tasks assessed   Cervical / Trunk Assessment Cervical / Trunk Assessment: Normal   Communication Communication Communication: No difficulties   Cognition Arousal/Alertness: Awake/alert Behavior During Therapy: WFL for tasks assessed/performed Overall Cognitive Status: Within Functional Limits for tasks assessed                                     General Comments  Demos G standing balance, G-/F+ balance with fxl mobility, reports balance has been slightly off since having COVID    Exercises Other Exercises Other Exercises: OT faciltiates education re: use of Incentive spirometer to strengthen lungs. Pt with good understanding and utilizes teach back method. OT covers EC stretegies including seated self care as needed. Pt with good understanding, EC packet issued.   Shoulder Instructions      Home Living Family/patient expects to be discharged to:: Private residence Living Arrangements: Spouse/significant other Available Help at Discharge: Friend(s);Available 24 hours/day Type of Home: Mobile home Home Access: Stairs to enter Entrance Stairs-Number of Steps: 3 Entrance Stairs-Rails: Right Home Layout: One level     Bathroom Shower/Tub: Walk-in Scientific laboratory technician: Standard Bathroom Accessibility: Yes   Home Equipment: Shower seat   Additional Comments: does not use shower seat      Prior Functioning/Environment Level of Independence: Independent        Comments: Ind with Amb and all aspects of self care, no AD or DME use. Drives, rides motorcycles, and pedals an electric trike with her dog riding in the back.        OT Problem List: Decreased activity tolerance;Cardiopulmonary status limiting activity      OT Treatment/Interventions: Self-care/ADL training;Therapeutic  exercise;Energy conservation;Therapeutic activities;Patient/family education    OT Goals(Current goals can be found in the care plan section) Acute Rehab OT Goals Patient Stated Goal: to go home and get my energy back OT Goal Formulation: All assessment and education complete, DC therapy  OT Frequency:     Barriers to D/C:            Co-evaluation              AM-PAC OT "6 Clicks" Daily Activity     Outcome Measure Help from another person eating meals?: None Help from another person taking care of personal grooming?: None Help from another person toileting, which includes using toliet, bedpan, or urinal?: None Help from another person bathing (including washing, rinsing, drying)?: None Help from another person to put on and  taking off regular upper body clothing?: None Help from another person to put on and taking off regular lower body clothing?: None 6 Click Score: 24   End of Session Equipment Utilized During Treatment: Gait belt Nurse Communication: Mobility status;Other (comment) (notified RN of pt's O2 status with and without nasal cannula as well as at rest vs with activity.)  Activity Tolerance: Patient tolerated treatment well Patient left: in chair;with call bell/phone within reach  OT Visit Diagnosis: Unsteadiness on feet (R26.81)                Time: 5015-8682 OT Time Calculation (min): 31 min Charges:  OT General Charges $OT Visit: 1 Visit OT Evaluation $OT Eval Low Complexity: 1 Low OT Treatments $Self Care/Home Management : 8-22 mins  Rejeana Brock, MS, OTR/L ascom [phone] 12/11/19, 11:18 AM

## 2019-12-12 ENCOUNTER — Other Ambulatory Visit: Payer: Self-pay | Admitting: Internal Medicine

## 2019-12-12 LAB — ECHOCARDIOGRAM COMPLETE
Height: 65 in
Weight: 2400 oz

## 2019-12-15 ENCOUNTER — Ambulatory Visit (INDEPENDENT_AMBULATORY_CARE_PROVIDER_SITE_OTHER): Payer: Medicare HMO | Admitting: Family Medicine

## 2019-12-15 ENCOUNTER — Other Ambulatory Visit: Payer: Self-pay

## 2019-12-15 ENCOUNTER — Encounter: Payer: Self-pay | Admitting: Family Medicine

## 2019-12-15 VITALS — BP 134/68 | HR 70 | Temp 97.8°F | Ht 65.0 in | Wt 147.6 lb

## 2019-12-15 DIAGNOSIS — I519 Heart disease, unspecified: Secondary | ICD-10-CM | POA: Diagnosis not present

## 2019-12-15 DIAGNOSIS — J441 Chronic obstructive pulmonary disease with (acute) exacerbation: Secondary | ICD-10-CM

## 2019-12-15 DIAGNOSIS — J9611 Chronic respiratory failure with hypoxia: Secondary | ICD-10-CM

## 2019-12-15 DIAGNOSIS — J439 Emphysema, unspecified: Secondary | ICD-10-CM

## 2019-12-15 DIAGNOSIS — I5189 Other ill-defined heart diseases: Secondary | ICD-10-CM

## 2019-12-15 NOTE — Progress Notes (Signed)
This visit was conducted in person.  BP 134/68 (BP Location: Left Arm, Patient Position: Sitting, Cuff Size: Normal)   Pulse 70   Temp 97.8 F (36.6 C) (Temporal)   Ht 5\' 5"  (1.651 m)   Wt 147 lb 9 oz (66.9 kg)   SpO2 96%   BMI 24.56 kg/m    CC: hospital f/u visit  Subjective:    Patient ID: Stacey Durham, female    DOB: 05/21/1946, 74 y.o.   MRN: [id number]  HPI: Stacey Durham is a 74 y.o. female presenting on 12/15/2019 for Hospitalization Follow-up   Recent hospitalization for dyspnea with wheezing and dry cough. Tested negative for COVID19. CXR showed smal L pleural effusion with increased interstitial markings, BNP normal. CT chest showed severe COPD emphysema bibasilar scarring and atx. Echocardiogram returned reassuring (EF 55-60%, normal LV fxn, G3DD). Treated for COPD exacerbation with prednisone burst and rec regular supplemental O2 use at 2L. PT did not feel she needed HHPT.   She did establish with [facility] recently, latest note 10/2019 reviewed.   Here without oxygen today - she has O2 tank in the car. She would like Rx for portable concentrator ([employer]). Currently using advanced [facility] ([employer]).   Planning to see pulm 11/2019) in October.  She now has incentive spirometer to use at home - got at local DME store.    Admit date: 12/10/2019 Discharge date: 12/11/2019 TCM hosp f/u phone call not performed  Recommendations for Outpatient Follow-up:  1. Follow up with PCP in 1-2 weeks 2. Follow-up with pulmonologist. 3. Please obtain BMP/CBC in one week 4. Please follow up on the following pending results:None  Home Health: No Equipment/Devices: Home oxygen Discharge Condition: Stable CODE STATUS:  Diet recommendation: Heart Healthy   Discharge Diagnoses:  Active Problems:   COPD with acute exacerbation (HCC)   Acute on chronic respiratory failure with hypoxia (HCC)     Relevant past medical, surgical, family and social history reviewed and updated  as indicated. Interim medical history since our last visit reviewed. Allergies and medications reviewed and updated. Outpatient Medications Prior to Visit  Medication Sig Dispense Refill  . albuterol (PROVENTIL) (2.5 MG/3ML) 0.083% nebulizer solution Take 3 mLs (2.5 mg total) by nebulization every 4 (four) hours as needed for wheezing or shortness of breath. 300 mL 1  . atorvastatin (LIPITOR) 20 MG tablet TAKE 1 TABLET BY MOUTH EVERY DAY (Patient taking differently: Take 20 mg by mouth daily. ) 90 tablet 3  . Biotin 02/11/2020 MCG TABS Take 10 mg by mouth daily.     . calcium-vitamin D (OSCAL WITH D) 500-200 MG-UNIT tablet Take 1 tablet by mouth.    . Dextromethorphan-Guaifenesin (MUCINEX DM PO) Take 1 tablet by mouth daily.     90240 ipratropium-albuterol (DUONEB) 0.5-2.5 (3) MG/3ML SOLN Take 3 mLs by nebulization every 4 (four) hours as needed. 360 mL 1  . loperamide (IMODIUM A-D) 2 MG tablet Take 1 tablet (2 mg total) by mouth daily as needed for diarrhea or loose stools.  0  . montelukast (SINGULAIR) 10 MG tablet TAKE 1 TABLET BY MOUTH EVERY DAY (Patient taking differently: Take 10 mg by mouth daily. ) 90 tablet 3  . Multiple Vitamins-Minerals (MULTIVITAMIN ADULT PO) Take 1 tablet by mouth.    . predniSONE (DELTASONE) 20 MG tablet Take 2 tablets (40 mg total) by mouth daily with breakfast for 4 days. 8 tablet 0  . Probiotic Product (PROBIOTIC-10 PO) Take 1 tablet by mouth.    Marland Kitchen  TRELEGY ELLIPTA 100-62.5-25 MCG/INH AEPB TAKE 1 PUFF INTO THE LUNGS EVERY MORNING (Patient taking differently: Inhale 1 tablet into the lungs daily. ) 60 each 6  . VENTOLIN HFA 108 (90 Base) MCG/ACT inhaler INHALE 2 PUFFS INTO THE LUNGS EVERY 4 (FOUR) HOURS AS NEEDED FOR WHEEZING OR SHORTNESS OF BREATH. 18 g 2  . vitamin B-12 (CYANOCOBALAMIN) 1000 MCG tablet Take 1,000 mcg by mouth daily.     No facility-administered medications prior to visit.     Per HPI unless specifically indicated in ROS section below Review of  Systems Objective:  BP 134/68 (BP Location: Left Arm, Patient Position: Sitting, Cuff Size: Normal)   Pulse 70   Temp 97.8 F (36.6 C) (Temporal)   Ht 5\' 5"  (1.651 m)   Wt 147 lb 9 oz (66.9 kg)   SpO2 96%   BMI 24.56 kg/m   Wt Readings from Last 3 Encounters:  12/15/19 147 lb 9 oz (66.9 kg)  12/10/19 150 lb (68 kg)  09/19/19 152 lb 8 oz (69.2 kg)      Physical Exam Vitals and nursing note reviewed.  Constitutional:      Appearance: Normal appearance. She is not ill-appearing.  Eyes:     Extraocular Movements: Extraocular movements intact.     Pupils: Pupils are equal, round, and reactive to light.  Cardiovascular:     Rate and Rhythm: Normal rate and regular rhythm.     Pulses: Normal pulses.     Heart sounds: Normal heart sounds. No murmur heard.   Pulmonary:     Effort: Pulmonary effort is normal. No respiratory distress.     Breath sounds: Wheezing (L>R expiratory) present. No rhonchi or rales.     Comments: Mild increased WOB, cough present Musculoskeletal:     Right lower leg: No edema.     Left lower leg: No edema.  Skin:    General: Skin is warm and dry.     Findings: No rash.  Neurological:     Mental Status: She is alert.  Psychiatric:        Mood and Affect: Mood normal.        Behavior: Behavior normal.       Results for orders placed or performed during the hospital encounter of 12/10/19  SARS Coronavirus 2 by RT PCR (hospital order, performed in [facility] hospital lab) Nasopharyngeal Nasopharyngeal Swab   Specimen: Nasopharyngeal Swab  Result Value Ref Range   SARS Coronavirus 2 NEGATIVE NEGATIVE  CBC with Differential  Result Value Ref Range   WBC 4.8 4.0 - 10.5 K/uL   RBC 4.52 3.87 - 5.11 MIL/uL   Hemoglobin 13.8 12.0 - 15.0 g/dL   HCT [facility] 36 - 46 %   MCV 87.6 80.0 - 100.0 fL   MCH 30.5 26.0 - 34.0 pg   MCHC 34.8 30.0 - 36.0 g/dL   RDW 38.2 50.5 - 39.7 %   Platelets 225 150 - 400 K/uL   nRBC 0.0 0.0 - 0.2 %   Neutrophils Relative % 58  %   Neutro Abs 2.8 1.7 - 7.7 K/uL   Lymphocytes Relative 28 %   Lymphs Abs 1.4 0.7 - 4.0 K/uL   Monocytes Relative 9 %   Monocytes Absolute 0.4 0 - 1 K/uL   Eosinophils Relative 4 %   Eosinophils Absolute 0.2 0 - 0 K/uL   Basophils Relative 1 %   Basophils Absolute 0.0 0 - 0 K/uL   Immature Granulocytes 0 %  Abs Immature Granulocytes 0.02 0.00 - 0.07 K/uL  Comprehensive metabolic panel  Result Value Ref Range   Sodium 139 135 - 145 mmol/L   Potassium 3.9 3.5 - 5.1 mmol/L   Chloride 102 98 - 111 mmol/L   CO2 28 22 - 32 mmol/L   Glucose, Bld 135 (H) 70 - 99 mg/dL   BUN 18 8 - 23 mg/dL   Creatinine, Ser 1.611.04 (H) 0.44 - 1.00 mg/dL   Calcium 8.7 (L) 8.9 - 10.3 mg/dL   Total Protein 7.1 6.5 - 8.1 g/dL   Albumin 3.7 3.5 - 5.0 g/dL   AST 29 15 - 41 U/L   ALT 22 0 - 44 U/L   Alkaline Phosphatase 85 38 - 126 U/L   Total Bilirubin 0.8 0.3 - 1.2 mg/dL   GFR calc non Af Amer 53 (L) >60 mL/min   GFR calc Af Amer >60 >60 mL/min   Anion gap 9 5 - 15  Brain natriuretic peptide  Result Value Ref Range   B Natriuretic Peptide 30.6 0.0 - 100.0 pg/mL  Procalcitonin - Baseline  Result Value Ref Range   Procalcitonin <0.10 ng/mL  Urinalysis, Complete w Microscopic  Result Value Ref Range   Color, Urine YELLOW (A) YELLOW   APPearance CLEAR (A) CLEAR   Specific Gravity, Urine 1.006 1.005 - 1.030   pH 7.0 5.0 - 8.0   Glucose, UA NEGATIVE NEGATIVE mg/dL   Hgb urine dipstick NEGATIVE NEGATIVE   Bilirubin Urine NEGATIVE NEGATIVE   Ketones, ur NEGATIVE NEGATIVE mg/dL   Protein, ur NEGATIVE NEGATIVE mg/dL   Nitrite NEGATIVE NEGATIVE   Leukocytes,Ua NEGATIVE NEGATIVE   RBC / HPF 0-5 0 - 5 RBC/hpf   WBC, UA 0-5 0 - 5 WBC/hpf   Bacteria, UA NONE SEEN NONE SEEN   Squamous Epithelial / LPF 0-5 0 - 5  HIV Antibody (routine testing w rflx)  Result Value Ref Range   HIV Screen 4th Generation wRfx Non Reactive Non Reactive  Procalcitonin  Result Value Ref Range   Procalcitonin <0.10 ng/mL   ECHOCARDIOGRAM COMPLETE  Result Value Ref Range   Weight 2,400 oz   Height 65 in   BP 137/66 mmHg  Troponin I (High Sensitivity)  Result Value Ref Range   Troponin I (High Sensitivity) 4 <18 ng/L   Assessment & Plan:  This visit occurred during the SARS-CoV-2 public health emergency.  Safety protocols were in place, including screening questions prior to the visit, additional usage of staff PPE, and extensive cleaning of exam room while observing appropriate contact time as indicated for disinfecting solutions.   Problem List Items Addressed This Visit    Grade III diastolic dysfunction   COPD exacerbation (HCC) - Primary    ER records reviewed. Treated with prednisone burst. Brings pill bottle showing instructions to take 40mg  for 10 days total - advised finish this course. Continues recovering well.       Chronic respiratory failure with hypoxia (HCC)    Has been recommended more regular supplemental O2 use - asks about portable oxygen concentrator - Rx provided today.       Chronic obstructive pulmonary disease (HCC)    She will continue pulm f/u given severe COPD.  Continue trelegy ellipta. Continue duonebs with albuterol PRN.  Has been recommended more regular supplemental O2 use - asks about portable oxygen concentrator - Rx provided today.           No orders of the defined types were placed in this  encounter.  No orders of the defined types were placed in this encounter.   Patient Instructions  Ok to use duoneb nebulizer more regularly over the next few days while getting over this COPD exacerbation.  I will write prescription for oxygen concentrator.  Finish prednisone, let us know if not continuing to improve each day.  Good to see you today.    Follow up plan: Return if symptoms worsen or fail to improve.  Eustaquio Boyden, MD

## 2019-12-15 NOTE — Patient Instructions (Addendum)
Ok to use duoneb nebulizer more regularly over the next few days while getting over this COPD exacerbation.  I will write prescription for oxygen concentrator.  Finish prednisone, let us know if not continuing to improve each day.  Good to see you today.

## 2019-12-16 ENCOUNTER — Encounter: Payer: Self-pay | Admitting: Family Medicine

## 2019-12-16 DIAGNOSIS — I5189 Other ill-defined heart diseases: Secondary | ICD-10-CM | POA: Insufficient documentation

## 2019-12-16 NOTE — Assessment & Plan Note (Signed)
ER records reviewed. Treated with prednisone burst. Brings pill bottle showing instructions to take 40mg  for 10 days total - advised finish this course. Continues recovering well.

## 2019-12-16 NOTE — Assessment & Plan Note (Signed)
Has been recommended more regular supplemental O2 use - asks about portable oxygen concentrator - Rx provided today.

## 2019-12-16 NOTE — Assessment & Plan Note (Addendum)
She will continue pulm f/u given severe COPD.  Continue trelegy ellipta. Continue duonebs with albuterol PRN.  Has been recommended more regular supplemental O2 use - asks about portable oxygen concentrator - Rx provided today.

## 2019-12-27 DIAGNOSIS — J411 Mucopurulent chronic bronchitis: Secondary | ICD-10-CM | POA: Diagnosis not present

## 2019-12-27 DIAGNOSIS — J449 Chronic obstructive pulmonary disease, unspecified: Secondary | ICD-10-CM | POA: Diagnosis not present

## 2019-12-28 DIAGNOSIS — J449 Chronic obstructive pulmonary disease, unspecified: Secondary | ICD-10-CM | POA: Diagnosis not present

## 2019-12-28 DIAGNOSIS — J411 Mucopurulent chronic bronchitis: Secondary | ICD-10-CM | POA: Diagnosis not present

## 2020-01-08 DIAGNOSIS — J411 Mucopurulent chronic bronchitis: Secondary | ICD-10-CM | POA: Diagnosis not present

## 2020-01-08 DIAGNOSIS — J9601 Acute respiratory failure with hypoxia: Secondary | ICD-10-CM | POA: Diagnosis not present

## 2020-01-08 DIAGNOSIS — J449 Chronic obstructive pulmonary disease, unspecified: Secondary | ICD-10-CM | POA: Diagnosis not present

## 2020-01-27 DIAGNOSIS — J411 Mucopurulent chronic bronchitis: Secondary | ICD-10-CM | POA: Diagnosis not present

## 2020-01-27 DIAGNOSIS — J449 Chronic obstructive pulmonary disease, unspecified: Secondary | ICD-10-CM | POA: Diagnosis not present

## 2020-01-28 DIAGNOSIS — J411 Mucopurulent chronic bronchitis: Secondary | ICD-10-CM | POA: Diagnosis not present

## 2020-01-28 DIAGNOSIS — J449 Chronic obstructive pulmonary disease, unspecified: Secondary | ICD-10-CM | POA: Diagnosis not present

## 2020-02-08 DIAGNOSIS — J9601 Acute respiratory failure with hypoxia: Secondary | ICD-10-CM | POA: Diagnosis not present

## 2020-02-08 DIAGNOSIS — J411 Mucopurulent chronic bronchitis: Secondary | ICD-10-CM | POA: Diagnosis not present

## 2020-02-08 DIAGNOSIS — J449 Chronic obstructive pulmonary disease, unspecified: Secondary | ICD-10-CM | POA: Diagnosis not present

## 2020-02-27 DIAGNOSIS — J449 Chronic obstructive pulmonary disease, unspecified: Secondary | ICD-10-CM | POA: Diagnosis not present

## 2020-02-27 DIAGNOSIS — J411 Mucopurulent chronic bronchitis: Secondary | ICD-10-CM | POA: Diagnosis not present

## 2020-02-28 DIAGNOSIS — J411 Mucopurulent chronic bronchitis: Secondary | ICD-10-CM | POA: Diagnosis not present

## 2020-02-28 DIAGNOSIS — J449 Chronic obstructive pulmonary disease, unspecified: Secondary | ICD-10-CM | POA: Diagnosis not present

## 2020-03-05 ENCOUNTER — Other Ambulatory Visit: Payer: Self-pay

## 2020-03-05 ENCOUNTER — Encounter: Payer: Self-pay | Admitting: Internal Medicine

## 2020-03-05 ENCOUNTER — Ambulatory Visit (INDEPENDENT_AMBULATORY_CARE_PROVIDER_SITE_OTHER): Payer: Medicare HMO | Admitting: Internal Medicine

## 2020-03-05 VITALS — BP 112/76 | HR 61 | Temp 97.1°F | Ht 64.0 in | Wt 151.6 lb

## 2020-03-05 DIAGNOSIS — J449 Chronic obstructive pulmonary disease, unspecified: Secondary | ICD-10-CM

## 2020-03-05 DIAGNOSIS — R69 Illness, unspecified: Secondary | ICD-10-CM | POA: Diagnosis not present

## 2020-03-05 NOTE — Progress Notes (Signed)
[facility] Pulmonary Medicine Consultation     Date: 03/05/2020,   MRN# [id number] Stacey Durham 1945-12-03   CHIEF COMPLAINT:  Follow up COPD    HISTORY OF PRESENT ILLNESS   Continue to take Trelegy daily  No exacerbation at this time No evidence of heart failure at this time No evidence or signs of infection at this time No respiratory distress No fevers, chills, nausea, vomiting, diarrhea No evidence of lower extremity edema No evidence hemoptysis  POST COVID pneumonia NOV 2020 Multiple ER visit in the past    Current Medication:   Current Outpatient Medications:  .  albuterol (PROVENTIL) (2.5 MG/3ML) 0.083% nebulizer solution, Take 3 mLs (2.5 mg total) by nebulization every 4 (four) hours as needed for wheezing or shortness of breath., Disp: 300 mL, Rfl: 1 .  atorvastatin (LIPITOR) 20 MG tablet, TAKE 1 TABLET BY MOUTH EVERY DAY (Patient taking differently: Take 20 mg by mouth daily. ), Disp: 90 tablet, Rfl: 3 .  Biotin 74944 MCG TABS, Take 10 mg by mouth daily. , Disp: , Rfl:  .  calcium-vitamin D (OSCAL WITH D) 500-200 MG-UNIT tablet, Take 1 tablet by mouth., Disp: , Rfl:  .  Dextromethorphan-Guaifenesin (MUCINEX DM PO), Take 1 tablet by mouth daily. , Disp: , Rfl:  .  ipratropium-albuterol (DUONEB) 0.5-2.5 (3) MG/3ML SOLN, Take 3 mLs by nebulization every 4 (four) hours as needed., Disp: 360 mL, Rfl: 1 .  loperamide (IMODIUM A-D) 2 MG tablet, Take 1 tablet (2 mg total) by mouth daily as needed for diarrhea or loose stools., Disp: , Rfl: 0 .  montelukast (SINGULAIR) 10 MG tablet, TAKE 1 TABLET BY MOUTH EVERY DAY (Patient taking differently: Take 10 mg by mouth daily. ), Disp: 90 tablet, Rfl: 3 .  Multiple Vitamins-Minerals (MULTIVITAMIN ADULT PO), Take 1 tablet by mouth., Disp: , Rfl:  .  Probiotic Product (PROBIOTIC-10 PO), Take 1 tablet by mouth., Disp: , Rfl:  .  TRELEGY ELLIPTA 100-62.5-25 MCG/INH AEPB, TAKE 1 PUFF INTO THE LUNGS EVERY MORNING (Patient  taking differently: Inhale 1 tablet into the lungs daily. ), Disp: 60 each, Rfl: 6 .  VENTOLIN HFA 108 (90 Base) MCG/ACT inhaler, INHALE 2 PUFFS INTO THE LUNGS EVERY 4 (FOUR) HOURS AS NEEDED FOR WHEEZING OR SHORTNESS OF BREATH., Disp: 18 g, Rfl: 2 .  vitamin B-12 (CYANOCOBALAMIN) 1000 MCG tablet, Take 1,000 mcg by mouth daily., Disp: , Rfl:      ALLERGIES   Patient has no known allergies.     REVIEW OF SYSTEMS   Review of Systems  Constitutional: Negative for chills, fever, malaise/fatigue and weight loss.  HENT: Negative for congestion.   Respiratory: Positive for cough and wheezing. Negative for hemoptysis, sputum production and shortness of breath.        Chronic SOB/DOE at baseline  Cardiovascular: Negative for chest pain, palpitations, orthopnea and leg swelling.  All other systems reviewed and are negative.   BP 112/76 (BP Location: Left Arm, Patient Position: Sitting, Cuff Size: Normal)   Pulse 61   Temp (!) 97.1 F (36.2 C) (Temporal)   Ht 5\' 4"  (1.626 m)   Wt 151 lb 9.6 oz (68.8 kg)   SpO2 96%   BMI 26.02 kg/m     PHYSICAL EXAM   Physical Examination:   General Appearance: No distress  Neuro:without focal findings,  speech normal,  HEENT: PERRLA, EOM intact.   Pulmonary: normal breath sounds, No wheezing.  CardiovascularNormal S1,S2.  No m/r/g.   Abdomen: Benign, Soft,  non-tender. Renal:  No costovertebral tenderness  GU:  Not performed at this time. Endoc: No evident thyromegaly Skin:   warm, no rashes, no ecchymosis  Extremities: normal, no cyanosis, clubbing. PSYCHIATRIC: Mood, affect within normal limits.   ALL OTHER ROS ARE NEGATIVE  CT chest Lung cancer Screening: 08/2015 No nodules/massess PFT 08/2015 Ratio 52% fev1 1.1 46%  fvc 2.1 67%  WNL ONO WNL     ASSESSMENT/PLAN   Severe COPD based on pulmonary function testing Gold stage D  Multiple COPD exacerbations in the past -seems to be stable at this time Continue Trelegy  therapy inhaler therapy   Albuterol as needed  Avoid secondhand smoke exposure  Avoid sick contacts  No need for ABX and Steroids  Chronic hypoxic respiratory failure Patient uses oxygen intermittently She needs this for survival  Post COVID-19 infection pneumonia November 2020 May consider CT chest to assess for ILD or fibrosis     COVID-19 EDUCATION: The signs and symptoms of COVID-19 were discussed with the patient and how to seek care for testing.  The importance of social distancing was discussed today. Hand Washing Techniques and avoid touching face was advised.     MEDICATION ADJUSTMENTS/LABS AND TESTS ORDERED: Continue inhalers as prescribed     CURRENT MEDICATIONS REVIEWED AT LENGTH WITH PATIENT TODAY   Patient satisfied with Plan of action and management. All questions answered  Follow up in 1 year  Total time spent 22 mins   Hyde Sires [name], M.D.  [name]  Medical Director [facility] Medical Director [facility] Cardio-Pulmonary Department

## 2020-03-05 NOTE — Patient Instructions (Addendum)
Continue inhalers as prescribed 

## 2020-03-09 DIAGNOSIS — J449 Chronic obstructive pulmonary disease, unspecified: Secondary | ICD-10-CM | POA: Diagnosis not present

## 2020-03-09 DIAGNOSIS — J411 Mucopurulent chronic bronchitis: Secondary | ICD-10-CM | POA: Diagnosis not present

## 2020-03-09 DIAGNOSIS — J9601 Acute respiratory failure with hypoxia: Secondary | ICD-10-CM | POA: Diagnosis not present

## 2020-03-24 ENCOUNTER — Other Ambulatory Visit: Payer: Self-pay | Admitting: Internal Medicine

## 2020-03-24 DIAGNOSIS — J449 Chronic obstructive pulmonary disease, unspecified: Secondary | ICD-10-CM

## 2020-03-28 DIAGNOSIS — J449 Chronic obstructive pulmonary disease, unspecified: Secondary | ICD-10-CM | POA: Diagnosis not present

## 2020-03-28 DIAGNOSIS — J411 Mucopurulent chronic bronchitis: Secondary | ICD-10-CM | POA: Diagnosis not present

## 2020-03-29 DIAGNOSIS — J449 Chronic obstructive pulmonary disease, unspecified: Secondary | ICD-10-CM | POA: Diagnosis not present

## 2020-03-29 DIAGNOSIS — J411 Mucopurulent chronic bronchitis: Secondary | ICD-10-CM | POA: Diagnosis not present

## 2020-04-09 DIAGNOSIS — J9601 Acute respiratory failure with hypoxia: Secondary | ICD-10-CM | POA: Diagnosis not present

## 2020-04-09 DIAGNOSIS — J449 Chronic obstructive pulmonary disease, unspecified: Secondary | ICD-10-CM | POA: Diagnosis not present

## 2020-04-09 DIAGNOSIS — J411 Mucopurulent chronic bronchitis: Secondary | ICD-10-CM | POA: Diagnosis not present

## 2020-04-28 DIAGNOSIS — J449 Chronic obstructive pulmonary disease, unspecified: Secondary | ICD-10-CM | POA: Diagnosis not present

## 2020-04-28 DIAGNOSIS — J411 Mucopurulent chronic bronchitis: Secondary | ICD-10-CM | POA: Diagnosis not present

## 2020-04-29 DIAGNOSIS — J449 Chronic obstructive pulmonary disease, unspecified: Secondary | ICD-10-CM | POA: Diagnosis not present

## 2020-04-29 DIAGNOSIS — J411 Mucopurulent chronic bronchitis: Secondary | ICD-10-CM | POA: Diagnosis not present

## 2020-05-06 ENCOUNTER — Telehealth: Payer: Self-pay | Admitting: Internal Medicine

## 2020-05-06 DIAGNOSIS — J449 Chronic obstructive pulmonary disease, unspecified: Secondary | ICD-10-CM

## 2020-05-06 NOTE — Telephone Encounter (Signed)
Ok, may  d/c oxygen per patient request

## 2020-05-06 NOTE — Telephone Encounter (Signed)
Spoke with pt and she expressed understanding that [employer] will be calling to come and pick up oxygen equipment.

## 2020-05-06 NOTE — Telephone Encounter (Signed)
Spoke to patient via telephone.   She would a order placed to [employer] to d/c oxygen. She has not used oxygen in months.   Dr. Belia Heman, please advise. Thanks

## 2020-05-06 NOTE — Telephone Encounter (Signed)
Order has been placed to d/x oxygen. Lm for patient.

## 2020-05-17 ENCOUNTER — Telehealth: Payer: Self-pay

## 2020-05-17 NOTE — Telephone Encounter (Signed)
Left message on vm per dpr notifying pt the form is ready to pick up.  [Placed form at front office- yellow folders.  Made copy to scan.]

## 2020-05-17 NOTE — Telephone Encounter (Signed)
Pt dropped off 2 [place] [employer]ing Plac[employer] forms to be completed.  Placed in Dr. Timoteo Expose box.

## 2020-05-17 NOTE — Telephone Encounter (Addendum)
Why 2 separate applications? That would total 4 placards? Recommend we just do one application form.  Would also verify duration - she circled placard good for only 2 months.  Filled and in lisa's box.

## 2020-05-17 NOTE — Telephone Encounter (Signed)
Lvm asking pt to call back.  Need to get answers to Dr. G's questions and relay message. 

## 2020-05-17 NOTE — Telephone Encounter (Signed)
Patient called back she was given two forms from front office. She only needs 2 placecards for 5 Year duration.  EM

## 2020-07-09 ENCOUNTER — Telehealth: Payer: Self-pay | Admitting: Family Medicine

## 2020-07-09 NOTE — Telephone Encounter (Signed)
LVM for pt to rtn my call to r/s appt with nha on 08/05/20 

## 2020-07-12 ENCOUNTER — Other Ambulatory Visit: Payer: Self-pay | Admitting: Family Medicine

## 2020-08-04 ENCOUNTER — Other Ambulatory Visit: Payer: Self-pay | Admitting: Family Medicine

## 2020-08-04 DIAGNOSIS — N1831 Chronic kidney disease, stage 3a: Secondary | ICD-10-CM

## 2020-08-04 DIAGNOSIS — E038 Other specified hypothyroidism: Secondary | ICD-10-CM

## 2020-08-04 DIAGNOSIS — R7303 Prediabetes: Secondary | ICD-10-CM

## 2020-08-04 DIAGNOSIS — E782 Mixed hyperlipidemia: Secondary | ICD-10-CM

## 2020-08-05 ENCOUNTER — Other Ambulatory Visit: Payer: Self-pay

## 2020-08-05 ENCOUNTER — Ambulatory Visit: Payer: Medicare HMO

## 2020-08-05 ENCOUNTER — Other Ambulatory Visit (INDEPENDENT_AMBULATORY_CARE_PROVIDER_SITE_OTHER): Payer: Medicare HMO

## 2020-08-05 DIAGNOSIS — E782 Mixed hyperlipidemia: Secondary | ICD-10-CM | POA: Diagnosis not present

## 2020-08-05 DIAGNOSIS — N1831 Chronic kidney disease, stage 3a: Secondary | ICD-10-CM

## 2020-08-05 DIAGNOSIS — R7303 Prediabetes: Secondary | ICD-10-CM

## 2020-08-05 DIAGNOSIS — E038 Other specified hypothyroidism: Secondary | ICD-10-CM

## 2020-08-05 LAB — CBC WITH DIFFERENTIAL/PLATELET
Basophils Absolute: 0 10*3/uL (ref 0.0–0.1)
Basophils Relative: 0.8 % (ref 0.0–3.0)
Eosinophils Absolute: 0.3 10*3/uL (ref 0.0–0.7)
Eosinophils Relative: 4.9 % (ref 0.0–5.0)
HCT: 40.5 % (ref 36.0–46.0)
Hemoglobin: 13.9 g/dL (ref 12.0–15.0)
Lymphocytes Relative: 33.3 % (ref 12.0–46.0)
Lymphs Abs: 1.8 10*3/uL (ref 0.7–4.0)
MCHC: 34.3 g/dL (ref 30.0–36.0)
MCV: 90.3 fl (ref 78.0–100.0)
Monocytes Absolute: 0.7 10*3/uL (ref 0.1–1.0)
Monocytes Relative: 13.6 % — ABNORMAL HIGH (ref 3.0–12.0)
Neutro Abs: 2.5 10*3/uL (ref 1.4–7.7)
Neutrophils Relative %: 47.4 % (ref 43.0–77.0)
Platelets: 169 10*3/uL (ref 150.0–400.0)
RBC: 4.48 Mil/uL (ref 3.87–5.11)
RDW: 13.1 % (ref 11.5–15.5)
WBC: 5.4 10*3/uL (ref 4.0–10.5)

## 2020-08-05 LAB — LIPID PANEL
Cholesterol: 137 mg/dL (ref 0–200)
HDL: 57.4 mg/dL (ref >39.00)
LDL Cholesterol: 54 mg/dL (ref 0–99)
NonHDL: 79.86
Total CHOL/HDL Ratio: 2
Triglycerides: 129 mg/dL (ref 0.0–149.0)
VLDL: 25.8 mg/dL (ref 0.0–40.0)

## 2020-08-05 LAB — COMPREHENSIVE METABOLIC PANEL
ALT: 19 U/L (ref 0–35)
AST: 21 U/L (ref 0–37)
Albumin: 3.7 g/dL (ref 3.5–5.2)
Alkaline Phosphatase: 84 U/L (ref 39–117)
BUN: 22 mg/dL (ref 6–23)
CO2: 29 mEq/L (ref 19–32)
Calcium: 9 mg/dL (ref 8.4–10.5)
Chloride: 105 mEq/L (ref 96–112)
Creatinine, Ser: 1.08 mg/dL (ref 0.40–1.20)
GFR: 50.2 mL/min — ABNORMAL LOW (ref >60.00)
Glucose, Bld: 112 mg/dL — ABNORMAL HIGH (ref 70–99)
Potassium: 4.5 mEq/L (ref 3.5–5.1)
Sodium: 142 mEq/L (ref 135–145)
Total Bilirubin: 0.9 mg/dL (ref 0.2–1.2)
Total Protein: 6 g/dL (ref 6.0–8.3)

## 2020-08-05 LAB — HEMOGLOBIN A1C: Hgb A1c MFr Bld: 6.1 % (ref 4.6–6.5)

## 2020-08-05 LAB — T4, FREE: Free T4: 0.89 ng/dL (ref 0.60–1.60)

## 2020-08-05 LAB — VITAMIN D 25 HYDROXY (VIT D DEFICIENCY, FRACTURES): VITD: 44.56 ng/mL (ref 30.00–100.00)

## 2020-08-05 LAB — MICROALBUMIN / CREATININE URINE RATIO
Creatinine,U: 110.4 mg/dL
Microalb Creat Ratio: 0.6 mg/g (ref 0.0–30.0)
Microalb, Ur: <0.7 mg/dL (ref 0.0–1.9)

## 2020-08-05 LAB — TSH: TSH: 6.46 u[IU]/mL — ABNORMAL HIGH (ref 0.35–4.50)

## 2020-08-07 ENCOUNTER — Ambulatory Visit (INDEPENDENT_AMBULATORY_CARE_PROVIDER_SITE_OTHER): Payer: Medicare HMO | Admitting: Family Medicine

## 2020-08-07 ENCOUNTER — Other Ambulatory Visit: Payer: Self-pay

## 2020-08-07 ENCOUNTER — Encounter: Payer: Self-pay | Admitting: Family Medicine

## 2020-08-07 VITALS — BP 118/66 | HR 91 | Temp 97.7°F | Ht 64.5 in | Wt 148.1 lb

## 2020-08-07 DIAGNOSIS — J439 Emphysema, unspecified: Secondary | ICD-10-CM | POA: Diagnosis not present

## 2020-08-07 DIAGNOSIS — N1831 Chronic kidney disease, stage 3a: Secondary | ICD-10-CM

## 2020-08-07 DIAGNOSIS — E782 Mixed hyperlipidemia: Secondary | ICD-10-CM | POA: Diagnosis not present

## 2020-08-07 DIAGNOSIS — Z0000 Encounter for general adult medical examination without abnormal findings: Secondary | ICD-10-CM | POA: Insufficient documentation

## 2020-08-07 DIAGNOSIS — Z87891 Personal history of nicotine dependence: Secondary | ICD-10-CM | POA: Diagnosis not present

## 2020-08-07 DIAGNOSIS — M8580 Other specified disorders of bone density and structure, unspecified site: Secondary | ICD-10-CM | POA: Diagnosis not present

## 2020-08-07 DIAGNOSIS — N3941 Urge incontinence: Secondary | ICD-10-CM | POA: Insufficient documentation

## 2020-08-07 DIAGNOSIS — Z7189 Other specified counseling: Secondary | ICD-10-CM | POA: Diagnosis not present

## 2020-08-07 DIAGNOSIS — E038 Other specified hypothyroidism: Secondary | ICD-10-CM | POA: Diagnosis not present

## 2020-08-07 DIAGNOSIS — R7303 Prediabetes: Secondary | ICD-10-CM

## 2020-08-07 DIAGNOSIS — I700 Atherosclerosis of aorta: Secondary | ICD-10-CM

## 2020-08-07 LAB — POC URINALSYSI DIPSTICK (AUTOMATED)
Blood, UA: NEGATIVE
Glucose, UA: NEGATIVE
Ketones, UA: NEGATIVE
Protein, UA: NEGATIVE
Spec Grav, UA: >=1.03 — AB (ref 1.010–1.025)
Urobilinogen, UA: 0.2 E.U./dL
pH, UA: 6 (ref 5.0–8.0)

## 2020-08-07 MED ORDER — SULFAMETHOXAZOLE-TRIMETHOPRIM 800-160 MG PO TABS: 1.0000 | ORAL_TABLET | Freq: Two times a day (BID) | ORAL | 0 refills | Status: DC

## 2020-08-07 NOTE — Assessment & Plan Note (Signed)
Continue statin. 

## 2020-08-07 NOTE — Assessment & Plan Note (Signed)
Preventative protocols reviewed and updated unless pt declined. Discussed healthy diet and lifestyle.  

## 2020-08-07 NOTE — Assessment & Plan Note (Signed)
Chronic, stable period. Continue to monitor.  

## 2020-08-07 NOTE — Assessment & Plan Note (Signed)
Discussed cal, vit D and regular weight bearing exercise

## 2020-08-07 NOTE — Progress Notes (Signed)
Patient ID: Stacey Durham, female    DOB: December 26, 1945, 75 y.o.   MRN: [id number]  This visit was conducted in person.  BP 118/66   Pulse 91   Temp 97.7 F (36.5 C) (Temporal)   Ht 5' 4.5" (1.638 m)   Wt 148 lb 1 oz (67.2 kg)   SpO2 94%   BMI 25.02 kg/m    CC: AMW/CPE Subjective:   HPI: Stacey Durham is a 74 y.o. female presenting on 08/07/2020 for Medicare Wellness   Did not see health advisor this year.   Hearing Screening   125Hz  250Hz  500Hz  1000Hz  2000Hz  3000Hz  4000Hz  6000Hz  8000Hz   Right ear:   25 40 25  0    Left ear:   25 40 25  0    Vision Screening Comments: Last eye exam, 09/23/2019.  Flowsheet Row Office Visit from 08/07/2020 in [facility] [place]  PHQ-2 Total Score 0      Fall Risk  08/07/2020 08/04/2019 07/28/2019 07/21/2018 07/14/2017  Falls in the past year? - 1 0 0 No  Number falls in past yr: 1 0 0 - -  Injury with Fall? 0 1 0 - -  Risk for fall due to : - History of fall(s) No Fall Risks - -  Follow up - Falls prevention discussed Falls evaluation completed;Falls prevention discussed - -    Severe COPD with chronic dyspnea and wheezing, followed by pulm ([facility]) on singulair, trelegy, duonebs PRN. Has stopped using - [place]. No recent COPD flares. Actually feeling great today!  Preventative: COLONOSCOPY 07/2016 HP, ext hem, diverticulosis, rpt 5 yrs (Outlaw) Breast cancer screening -mammo WNL2/2020. Overdue for f/u. Wants to wait til summer when Stacey Durham [place] from the beach.  Well woman exam -s/p hysterectomy 22-Sep-1976, part of an ovary remains. DEXAT score -2.2 spine, -1.2 hip(08/2017). Takes 1 cal/vit D pill daily and MVI. Discussed weight bearing exercise. Lung cancer screening -recent chest CT 12/2016 stable. Completed screening. Flu shot -yearly COVID vaccine Pfizer 07/2019, 07/2019, booster 01/2020 Tdap 09/22/2012 Pneumovax 03/2012, prevnar 06/2015 shingrix - discussed Advanced directive  discussion - packet previously provided, again today. HCPOA -unsure.  Seat belt use discussed Sunscreen usediscussed. No changing moles on skin. Ex smoker quit Sep 22, 2001 Alcohol - none  Dentist - doesn't see, has full dentures Eye exam yearly  Bowel - no constipation  Bladder - nocturia x2-3, urinary urge incontinence, wears pad daily. This is not too bothersome. Ongoing for a few months. No stress incontinence symptoms.   Originally from [place] Widower - husband passed away 09-23-10 Lives alone, 1 chihuahua GF of 07/2019 Occ: retired, worked at 08/2019: enjoys going to 03/2020: good water, fruits/vegetables daily     Relevant past medical, surgical, family and social history reviewed and updated as indicated. Interim medical history since our last visit reviewed. Allergies and medications reviewed and updated. Outpatient Medications Prior to Visit  Medication Sig Dispense Refill  . atorvastatin (LIPITOR) 20 MG tablet TAKE 1 TABLET BY MOUTH EVERY DAY (Patient taking differently: Take 20 mg by mouth daily.) 90 tablet 3  . Biotin 2015 MCG TABS Take 10 mg by mouth daily.     . calcium-vitamin D (OSCAL WITH D) 500-200 MG-UNIT tablet Take 1 tablet by mouth.    . Dextromethorphan-Guaifenesin (MUCINEX DM PO) Take 1 tablet by mouth daily.    04/2012 ipratropium-albuterol (DUONEB) 0.5-2.5 (3) MG/3ML SOLN Take 3 mLs by nebulization every  4 (four) hours as needed. 360 mL 1  . loperamide (IMODIUM A-D) 2 MG tablet Take 1 tablet (2 mg total) by mouth daily as needed for diarrhea or loose stools.  0  . montelukast (SINGULAIR) 10 MG tablet TAKE 1 TABLET BY MOUTH EVERY DAY 90 tablet 0  . Multiple Vitamins-Minerals (MULTIVITAMIN ADULT PO) Take 1 tablet by mouth.    . Probiotic Product (PROBIOTIC-10 PO) Take 1 tablet by mouth.    . TRELEGY ELLIPTA 100-62.5-25 MCG/INH AEPB INHALE 1 PUFF INTO THE LUNGS EVERY MORNING 60 each 6  . VENTOLIN HFA 108 (90 Base) MCG/ACT inhaler INHALE 2 PUFFS  INTO THE LUNGS EVERY 4 (FOUR) HOURS AS NEEDED FOR WHEEZING OR SHORTNESS OF BREATH. 18 g 2  . vitamin B-12 (CYANOCOBALAMIN) 1000 MCG tablet Take 1,000 mcg by mouth daily.    Marland Kitchen. albuterol (PROVENTIL) (2.5 MG/3ML) 0.083% nebulizer solution Take 3 mLs (2.5 mg total) by nebulization every 4 (four) hours as needed for wheezing or shortness of breath. 300 mL 1   No facility-administered medications prior to visit.     Per HPI unless specifically indicated in ROS section below Review of Systems  Constitutional: Negative for activity change, appetite change, chills, fatigue, fever and unexpected weight change.  HENT: Negative for hearing loss.   Eyes: Negative for visual disturbance.  Respiratory: Negative for cough, chest tightness, shortness of breath and wheezing.   Cardiovascular: Negative for chest pain, palpitations and leg swelling.  Gastrointestinal: Positive for diarrhea. Negative for abdominal distention, abdominal pain, blood in stool, constipation, nausea and vomiting.  Genitourinary: Negative for difficulty urinating and hematuria.  Musculoskeletal: Negative for arthralgias, myalgias and neck pain.  Skin: Negative for rash.  Neurological: Negative for dizziness, seizures, syncope and headaches.  Hematological: Negative for adenopathy. Does not bruise/bleed easily.  Psychiatric/Behavioral: Negative for dysphoric mood. The patient is not nervous/anxious.    Objective:  BP 118/66   Pulse 91   Temp 97.7 F (36.5 C) (Temporal)   Ht 5' 4.5" (1.638 m)   Wt 148 lb 1 oz (67.2 kg)   SpO2 94%   BMI 25.02 kg/m   Wt Readings from Last 3 Encounters:  08/07/20 148 lb 1 oz (67.2 kg)  03/05/20 151 lb 9.6 oz (68.8 kg)  12/15/19 147 lb 9 oz (66.9 kg)      Physical Exam Vitals and nursing note reviewed.  Constitutional:      General: Stacey Durham is not in acute distress.    Appearance: Normal appearance. Stacey Durham is well-developed and well-nourished. Stacey Durham is not ill-appearing.  HENT:     Head:  Normocephalic and atraumatic.     Right Ear: Hearing, tympanic membrane, ear canal and external ear normal.     Left Ear: Hearing, tympanic membrane, ear canal and external ear normal.     Mouth/Throat:     Mouth: Oropharynx is clear and moist and mucous membranes are normal.     Pharynx: No posterior oropharyngeal edema.  Eyes:     General: No scleral icterus.    Extraocular Movements: Extraocular movements intact and EOM normal.     Conjunctiva/sclera: Conjunctivae normal.     Pupils: Pupils are equal, round, and reactive to light.  Neck:     Thyroid: No thyroid mass, thyromegaly or thyroid tenderness.     Vascular: No carotid bruit.  Cardiovascular:     Rate and Rhythm: Normal rate and regular rhythm.     Pulses: Normal pulses and intact distal pulses.  Radial pulses are 2+ on the right side and 2+ on the left side.     Heart sounds: Normal heart sounds. No murmur heard.   Pulmonary:     Effort: Pulmonary effort is normal. No respiratory distress.     Breath sounds: Normal breath sounds. No wheezing, rhonchi or rales.  Abdominal:     General: Bowel sounds are normal. There is no distension.     Palpations: Abdomen is soft. There is no mass.     Tenderness: There is no abdominal tenderness. There is no guarding or rebound.  Musculoskeletal:        General: No edema. Normal range of motion.     Cervical back: Normal range of motion and neck supple.     Right lower leg: No edema.     Left lower leg: No edema.  Lymphadenopathy:     Cervical: No cervical adenopathy.  Skin:    General: Skin is warm and dry.     Findings: No rash.  Neurological:     General: No focal deficit present.     Mental Status: Stacey Durham is alert and oriented to person, place, and time.     Comments: CN grossly intact, station and gait intact  Psychiatric:        Mood and Affect: Mood and affect and mood normal.        Behavior: Behavior normal.        Thought Content: Thought content normal.         Judgment: Judgment normal.       Results for orders placed or performed in visit on 08/07/20  POCT Urinalysis Dipstick (Automated)  Result Value Ref Range   Color, UA yellow    Clarity, UA cloudy    Glucose, UA Negative Negative   Bilirubin, UA 1+    Ketones, UA negative    Spec Grav, UA >=1.030 (A) 1.010 - 1.025   Blood, UA negative    pH, UA 6.0 5.0 - 8.0   Protein, UA Negative Negative   Urobilinogen, UA 0.2 0.2 or 1.0 E.U./dL   Nitrite, UA postive    Leukocytes, UA Small (1+) (A) Negative   Assessment & Plan:  This visit occurred during the SARS-CoV-2 public health emergency.  Safety protocols were in place, including screening questions prior to the visit, additional usage of staff PPE, and extensive cleaning of exam room while observing appropriate contact time as indicated for disinfecting solutions.   Problem List Items Addressed This Visit    Ex-smoker    Continues abstinent      Chronic obstructive pulmonary disease (HCC)    Chronic, severe, overall stable period on trelegy - continue. Sees pulm. No longer using home O2 - [place]       Subclinical hypothyroidism    Stable readings. Continue yearly check. Pt asxs.       Aortic atherosclerosis (HCC)    Continue statin.       Health maintenance examination    Preventative protocols reviewed and updated unless pt declined. Discussed healthy diet and lifestyle.       Advanced care planning/counseling discussion    Advanced directive discussion - packet previously provided, again today. HCPOA -unsure.       HLD (hyperlipidemia)    Chronic, stable on atorvastatin The 10-year ASCVD risk score Denman George DC Jr., et al., 2013) is: 13.4%   Values used to calculate the score:     Age: 26 years  Sex: Female     Is Non-Hispanic African American: No     Diabetic: No     Tobacco smoker: No     Systolic Blood Pressure: 118 mmHg     Is BP treated: No     HDL Cholesterol: 57.4 mg/dL      Total Cholesterol: 137 mg/dL       Prediabetes    Encouraged limiting added sugars in diet.       Osteopenia    Discussed cal, vit D and regular weight bearing exercise      CKD (chronic kidney disease) stage 3, GFR 30-59 ml/min (HCC)    Chronic, stable period. Continue to monitor.       Medicare annual wellness visit, subsequent - Primary    I have personally reviewed the Medicare Annual Wellness questionnaire and have noted 1. The patient's medical and social history 2. Their use of alcohol, tobacco or illicit drugs 3. Their current medications and supplements 4. The patient's functional ability including ADL's, fall risks, home safety risks and hearing or visual impairment. Cognitive function has been assessed and addressed as indicated.  5. Diet and physical activity 6. Evidence for depression or mood disorders The patients weight, height, BMI have been recorded in the chart. I have made referrals, counseling and provided education to the patient based on review of the above and I have provided the pt with a written personalized care plan for preventive services. Provider list updated.. See scanned questionairre as needed for further documentation. Reviewed preventative protocols and updated unless pt declined.       Urge incontinence of urine    Noted increased urinary urgency over the last few months, not too bothersome. Wears pad regularly. Check baseline UA today.       Relevant Orders   POCT Urinalysis Dipstick (Automated) (Completed)       No orders of the defined types were placed in this encounter.  Orders Placed This Encounter  Procedures  . POCT Urinalysis Dipstick (Automated)    Patient instructions: Urinalysis today for urinary incontinence issues. Call to schedule mammogram at your convenience: [facility] [phone] If interested, check with pharmacy about new 2 shot shingles series (shingrix).  Advanced directive packet  provided today  Return as needed or in 1 year for next physical.   Follow up plan: Return in about 1 year (around 08/07/2021) for annual exam, prior fasting for blood work, medicare wellness visit.  Eustaquio Boyden, MD

## 2020-08-07 NOTE — Addendum Note (Signed)
Addended by: Eustaquio Boyden on: 08/07/2020 01:53 PM   Modules accepted: Orders

## 2020-08-07 NOTE — Patient Instructions (Addendum)
Urinalysis today for urinary incontinence issues. Call to schedule mammogram at your convenience: [facility] [phone] If interested, check with pharmacy about new 2 shot shingles series (shingrix).  Advanced directive packet provided today  Return as needed or in 1 year for next physical.   Health Maintenance After Age 76 After age 20, you are at a higher risk for certain long-term diseases and infections as well as injuries from falls. Falls are a major cause of broken bones and head injuries in people who are older than age 52. Getting regular preventive care can help to keep you healthy and well. Preventive care includes getting regular testing and making lifestyle changes as recommended by your health care provider. Talk with your health care provider about:  Which screenings and tests you should have. A screening is a test that checks for a disease when you have no symptoms.  A diet and exercise plan that is right for you. What should I know about screenings and tests to prevent falls? Screening and testing are the best ways to find a health problem early. Early diagnosis and treatment give you the best chance of managing medical conditions that are common after age 33. Certain conditions and lifestyle choices may make you more likely to have a fall. Your health care provider may recommend:  Regular vision checks. Poor vision and conditions such as cataracts can make you more likely to have a fall. If you wear glasses, make sure to get your prescription updated if your vision changes.  Medicine review. Work with your health care provider to regularly review all of the medicines you are taking, including over-the-counter medicines. Ask your health care provider about any side effects that may make you more likely to have a fall. Tell your health care provider if any medicines that you take make you feel dizzy or sleepy.  Osteoporosis screening. Osteoporosis is a  condition that causes the bones to get weaker. This can make the bones weak and cause them to break more easily.  Blood pressure screening. Blood pressure changes and medicines to control blood pressure can make you feel dizzy.  Strength and balance checks. Your health care provider may recommend certain tests to check your strength and balance while standing, walking, or changing positions.  Foot health exam. Foot pain and numbness, as well as not wearing proper footwear, can make you more likely to have a fall.  Depression screening. You may be more likely to have a fall if you have a fear of falling, feel emotionally low, or feel unable to do activities that you used to do.  Alcohol use screening. Using too much alcohol can affect your balance and may make you more likely to have a fall. What actions can I take to lower my risk of falls? General instructions  Talk with your health care provider about your risks for falling. Tell your health care provider if: ? You fall. Be sure to tell your health care provider about all falls, even ones that seem minor. ? You feel dizzy, sleepy, or off-balance.  Take over-the-counter and prescription medicines only as told by your health care provider. These include any supplements.  Eat a healthy diet and maintain a healthy weight. A healthy diet includes low-fat dairy products, low-fat (lean) meats, and fiber from whole grains, beans, and lots of fruits and vegetables. Home safety  Remove any tripping hazards, such as rugs, cords, and clutter.  Install safety equipment such as grab bars in  bathrooms and safety rails on stairs.  Keep rooms and walkways well-lit. Activity  Follow a regular exercise program to stay fit. This will help you maintain your balance. Ask your health care provider what types of exercise are appropriate for you.  If you need a cane or walker, use it as recommended by your health care provider.  Wear supportive shoes that  have nonskid soles.   Lifestyle  Do not drink alcohol if your health care provider tells you not to drink.  If you drink alcohol, limit how much you have: ? 0-1 drink a day for women. ? 0-2 drinks a day for men.  Be aware of how much alcohol is in your drink. In the [place] one drink equals one typical bottle of beer (12 oz), one-half glass of wine (5 oz), or one shot of hard liquor (1 oz).  Do not use any products that contain nicotine or tobacco, such as cigarettes and e-cigarettes. If you need help quitting, ask your health care provider. Summary  Having a healthy lifestyle and getting preventive care can help to protect your health and wellness after age 15.  Screening and testing are the best way to find a health problem early and help you avoid having a fall. Early diagnosis and treatment give you the best chance for managing medical conditions that are more common for people who are older than age 46.  Falls are a major cause of broken bones and head injuries in people who are older than age 46. Take precautions to prevent a fall at home.  Work with your health care provider to learn what changes you can make to improve your health and wellness and to prevent falls. This information is not intended to replace advice given to you by your health care provider. Make sure you discuss any questions you have with your health care provider. Document Revised: 09/08/2018 Document Reviewed: 03/31/2017 Elsevier Patient Education  2021 [employer].

## 2020-08-07 NOTE — Assessment & Plan Note (Signed)
Chronic, severe, overall stable period on trelegy - continue. Sees pulm. No longer using home O2 - [place]

## 2020-08-07 NOTE — Assessment & Plan Note (Addendum)
Stable readings. Continue yearly check. Pt asxs.

## 2020-08-07 NOTE — Assessment & Plan Note (Addendum)
Noted increased urinary urgency over the last few months, not too bothersome. Wears pad regularly. Check baseline UA today - suspicious for infection. Will send culture and start abx course in interim.

## 2020-08-07 NOTE — Assessment & Plan Note (Signed)
Continues abstinent.  

## 2020-08-07 NOTE — Assessment & Plan Note (Signed)

## 2020-08-07 NOTE — Assessment & Plan Note (Signed)
Chronic, stable on atorvastatin The 10-year ASCVD risk score Denman George DC Jr., et al., 2013) is: 13.4%   Values used to calculate the score:     Age: 75 years     Sex: Female     Is Non-Hispanic African American: No     Diabetic: No     Tobacco smoker: No     Systolic Blood Pressure: 118 mmHg     Is BP treated: No     HDL Cholesterol: 57.4 mg/dL     Total Cholesterol: 137 mg/dL

## 2020-08-07 NOTE — Assessment & Plan Note (Signed)
Advanced directive discussion - packet previously provided, again today. HCPOA -unsure.

## 2020-08-07 NOTE — Assessment & Plan Note (Signed)
Encouraged limiting added sugars in diet.  

## 2020-08-09 ENCOUNTER — Other Ambulatory Visit: Payer: Self-pay | Admitting: Family Medicine

## 2020-08-09 LAB — URINE CULTURE
MICRO NUMBER:: 11626921
SPECIMEN QUALITY:: ADEQUATE

## 2020-08-11 ENCOUNTER — Other Ambulatory Visit: Payer: Self-pay | Admitting: Family Medicine

## 2020-08-12 NOTE — Telephone Encounter (Signed)
Pharmacy requests refill on: Atorvastatin 20 mg   LAST REFILL: 08/04/2019 (Q-90, R-3) LAST OV: 08/07/2020 NEXT OV: 08/11/2021 PHARMACY: [id number] [place], [place]

## 2020-08-21 ENCOUNTER — Ambulatory Visit: Payer: Medicare HMO

## 2020-09-20 ENCOUNTER — Other Ambulatory Visit: Payer: Self-pay | Admitting: Family Medicine

## 2020-09-20 DIAGNOSIS — Z1231 Encounter for screening mammogram for malignant neoplasm of breast: Secondary | ICD-10-CM

## 2020-10-10 ENCOUNTER — Other Ambulatory Visit: Payer: Self-pay | Admitting: Family Medicine

## 2020-11-08 ENCOUNTER — Other Ambulatory Visit: Payer: Self-pay

## 2020-11-08 ENCOUNTER — Ambulatory Visit
Admission: RE | Admit: 2020-11-08 | Discharge: 2020-11-08 | Disposition: A | Discharge status: Home / Self Care | Payer: Medicare HMO | Source: Ambulatory Visit | Attending: Family Medicine | Admitting: Family Medicine

## 2020-11-08 DIAGNOSIS — Z1231 Encounter for screening mammogram for malignant neoplasm of breast: Secondary | ICD-10-CM

## 2020-11-12 ENCOUNTER — Other Ambulatory Visit: Payer: Self-pay | Admitting: Family Medicine

## 2020-11-12 DIAGNOSIS — R928 Other abnormal and inconclusive findings on diagnostic imaging of breast: Secondary | ICD-10-CM

## 2020-11-28 ENCOUNTER — Ambulatory Visit
Admission: RE | Admit: 2020-11-28 | Discharge: 2020-11-28 | Disposition: A | Discharge status: Home / Self Care | Payer: Medicare HMO | Source: Ambulatory Visit | Attending: Family Medicine | Admitting: Family Medicine

## 2020-11-28 ENCOUNTER — Other Ambulatory Visit: Payer: Self-pay

## 2020-11-28 DIAGNOSIS — R928 Other abnormal and inconclusive findings on diagnostic imaging of breast: Secondary | ICD-10-CM

## 2020-11-28 DIAGNOSIS — R922 Inconclusive mammogram: Secondary | ICD-10-CM | POA: Diagnosis not present

## 2020-11-28 DIAGNOSIS — N6489 Other specified disorders of breast: Secondary | ICD-10-CM | POA: Diagnosis not present

## 2020-12-23 ENCOUNTER — Other Ambulatory Visit: Payer: Self-pay | Admitting: Internal Medicine

## 2020-12-23 DIAGNOSIS — J449 Chronic obstructive pulmonary disease, unspecified: Secondary | ICD-10-CM

## 2021-02-14 ENCOUNTER — Other Ambulatory Visit: Payer: Self-pay | Admitting: Family Medicine

## 2021-03-24 ENCOUNTER — Encounter: Payer: Self-pay | Admitting: Internal Medicine

## 2021-03-24 ENCOUNTER — Other Ambulatory Visit: Payer: Self-pay

## 2021-03-24 ENCOUNTER — Ambulatory Visit: Payer: Medicare HMO | Admitting: Internal Medicine

## 2021-03-24 VITALS — BP 124/80 | HR 64 | Temp 97.9°F | Ht 64.5 in | Wt 146.6 lb

## 2021-03-24 DIAGNOSIS — J441 Chronic obstructive pulmonary disease with (acute) exacerbation: Secondary | ICD-10-CM | POA: Diagnosis not present

## 2021-03-24 DIAGNOSIS — Z87891 Personal history of nicotine dependence: Secondary | ICD-10-CM | POA: Diagnosis not present

## 2021-03-24 DIAGNOSIS — J449 Chronic obstructive pulmonary disease, unspecified: Secondary | ICD-10-CM

## 2021-03-24 MED ORDER — IPRATROPIUM-ALBUTEROL 0.5-2.5 (3) MG/3ML IN SOLN: 3.0000 mL | RESPIRATORY_TRACT | 6 refills | Status: DC | PRN

## 2021-03-24 MED ORDER — TRELEGY ELLIPTA 100-62.5-25 MCG/ACT IN AEPB: 1.0000 | INHALATION_SPRAY | Freq: Every day | RESPIRATORY_TRACT | 11 refills | Status: DC

## 2021-03-24 MED ORDER — ALBUTEROL SULFATE HFA 108 (90 BASE) MCG/ACT IN AERS: 2.0000 | INHALATION_SPRAY | RESPIRATORY_TRACT | 11 refills | Status: DC | PRN

## 2021-03-24 MED ORDER — TRELEGY ELLIPTA 100-62.5-25 MCG/ACT IN AEPB: 1.0000 | INHALATION_SPRAY | Freq: Every day | RESPIRATORY_TRACT | 0 refills | Status: DC

## 2021-03-24 NOTE — Patient Instructions (Signed)
Continue Inhalers as prescribed re-enroll in Lung cancer screening program

## 2021-03-24 NOTE — Progress Notes (Signed)
[facility] Consultation     Date: 03/24/2021,   MRN# [id number] Stacey Durham Mar 04, 1946   CHIEF COMPLAINT:  Follow up COPD assessment    HISTORY OF PRESENT ILLNESS   Continues to take Trelegy daily No exacerbation at this time No evidence of heart failure at this time No evidence or signs of infection at this time No respiratory distress No fevers, chills, nausea, vomiting, diarrhea No evidence of lower extremity edema No evidence hemoptysis  POST COVID pneumonia NOV 2020 Multiple ER visit in the past    Current Medication:   Current Outpatient Medications:    atorvastatin (LIPITOR) 20 MG tablet, TAKE 1 TABLET BY MOUTH EVERY DAY, Disp: 90 tablet, Rfl: 1   Biotin 94854 MCG TABS, Take 10 mg by mouth daily. , Disp: , Rfl:    calcium-vitamin D (OSCAL WITH D) 500-200 MG-UNIT tablet, Take 1 tablet by mouth., Disp: , Rfl:    Dextromethorphan-Guaifenesin (MUCINEX DM PO), Take 1 tablet by mouth daily., Disp: , Rfl:    ipratropium-albuterol (DUONEB) 0.5-2.5 (3) MG/3ML SOLN, Take 3 mLs by nebulization every 4 (four) hours as needed., Disp: 360 mL, Rfl: 1   loperamide (IMODIUM A-D) 2 MG tablet, Take 1 tablet (2 mg total) by mouth daily as needed for diarrhea or loose stools., Disp: , Rfl: 0   montelukast (SINGULAIR) 10 MG tablet, TAKE 1 TABLET BY MOUTH EVERY DAY, Disp: 90 tablet, Rfl: 3   Multiple Vitamins-Minerals (MULTIVITAMIN ADULT PO), Take 1 tablet by mouth., Disp: , Rfl:    Probiotic Product (PROBIOTIC-10 PO), Take 1 tablet by mouth., Disp: , Rfl:    TRELEGY ELLIPTA 100-62.5-25 MCG/INH AEPB, INHALE 1 PUFF INTO THE LUNGS EVERY MORNING, Disp: 60 each, Rfl: 2   VENTOLIN HFA 108 (90 Base) MCG/ACT inhaler, INHALE 2 PUFFS INTO THE LUNGS EVERY 4 (FOUR) HOURS AS NEEDED FOR WHEEZING OR SHORTNESS OF BREATH., Disp: 18 g, Rfl: 2   vitamin B-12 (CYANOCOBALAMIN) 1000 MCG tablet, Take 1,000 mcg by mouth daily., Disp: , Rfl:      ALLERGIES   Patient has no  known allergies.    Review of Systems:  Gen:  Denies  fever, sweats, chills weight loss  HEENT: Denies blurred vision, double vision, ear pain, eye pain, hearing loss, nose bleeds, sore throat Cardiac:  No dizziness, chest pain or heaviness, chest tightness,edema, No JVD Resp:   No cough, -sputum production, +shortness of breath,-wheezing, -hemoptysis,  other:  All other systems negative    Physical Examination:   General Appearance: No distress  EYES PERRLA, EOM intact.   NECK Supple, No JVD Pulmonary: normal breath sounds, No wheezing.  CardiovascularNormal S1,S2.  No m/r/g.    ALL OTHER ROS ARE NEGATIVE    CT chest Lung cancer Screening: 08/2015 No nodules/massess PFT 08/2015 Ratio 52% fev1 1.1 46%  fvc 2.1 67%  WNL ONO WNL     ASSESSMENT/PLAN    Gold stage D Severe COPD based on pulmonary function testing  And previous history of COPD exacerbations in the past  Seems to be stable at this time  Continue Trelegy inhaler therapy  Albuterol as needed  Avoid secondhand smoke exposure  No indication for antibiotics or steroids at this time   Chronic hypoxic respiratory failure from COPD Uses oxygen intermittently  COVID-19 infection pneumonia November 2020   Lung cancer screening program last CT of the chest was July 2021 Severe emphysema Bibasilar scarring and atelectasis, likely accounting for chest x-ray finding.   MEDICATION ADJUSTMENTS/LABS  AND TESTS ORDERED: Continue inhalers as prescribed Lung cancer screening program re-establishing    CURRENT MEDICATIONS REVIEWED AT LENGTH WITH PATIENT TODAY   Patient satisfied with Plan of action and management. All questions answered  Follow up in 1 year  Total time spent 26 mins   Leiann Sporer [name], M.D.  [name]  Medical Director [facility] Medical Director [facility] Cardio-Pulmonary Department

## 2021-04-01 ENCOUNTER — Encounter: Payer: Self-pay | Admitting: Family Medicine

## 2021-04-01 ENCOUNTER — Ambulatory Visit (INDEPENDENT_AMBULATORY_CARE_PROVIDER_SITE_OTHER): Payer: Medicare HMO | Admitting: Family Medicine

## 2021-04-01 ENCOUNTER — Other Ambulatory Visit: Payer: Self-pay

## 2021-04-01 VITALS — BP 126/80 | HR 76 | Temp 97.4°F | Ht 64.5 in | Wt 146.0 lb

## 2021-04-01 DIAGNOSIS — W19XXXA Unspecified fall, initial encounter: Secondary | ICD-10-CM | POA: Diagnosis not present

## 2021-04-01 DIAGNOSIS — S40022A Contusion of left upper arm, initial encounter: Secondary | ICD-10-CM

## 2021-04-01 DIAGNOSIS — S40022S Contusion of left upper arm, sequela: Secondary | ICD-10-CM | POA: Insufficient documentation

## 2021-04-01 MED ORDER — MUPIROCIN CALCIUM 2 % EX CREA: 1.0000 "application " | TOPICAL_CREAM | Freq: Two times a day (BID) | CUTANEOUS | 0 refills | Status: DC

## 2021-04-01 NOTE — Assessment & Plan Note (Signed)
Anticipate residual calcifications after recent hematoma that has since healed. rec try warm compresses to area. No significant pain at this time. Update if enlarging or worsening.

## 2021-04-01 NOTE — Progress Notes (Signed)
Patient ID: Stacey Durham, female    DOB: 09-Dec-1945, 75 y.o.   MRN: [id number]  This visit was conducted in person.  BP 126/80   Pulse 76   Temp (!) 97.4 F (36.3 C) (Temporal)   Ht 5' 4.5" (1.638 m)   Wt 146 lb (66.2 kg)   SpO2 96%   BMI 24.67 kg/m    CC: arm injury Subjective:   HPI: Stacey Durham is a 75 y.o. female presenting on 04/01/2021 for Arm Injury (Injured left arm due to a fall about 2 wks ago.  Had bruising, now resolved.  Now has bumps that sting occasionally.)   DOI: ~2 wks ago  Sufferd fall at vet's office bumped into flower pot fell down and landed on her left posterior upper arm. Had bruise to medial elbow to axilla. Bruising has resolved but she has had ongoing bumps to inner upper arm that occasionally sting. She didn't treat this with anything. ROM preserved.   No premonitory symptoms prior to fall.   Requests mupirocin for rash to nose  Neosporin didn't help.      Relevant past medical, surgical, family and social history reviewed and updated as indicated. Interim medical history since our last visit reviewed. Allergies and medications reviewed and updated. Outpatient Medications Prior to Visit  Medication Sig Dispense Refill   albuterol (VENTOLIN HFA) 108 (90 Base) MCG/ACT inhaler Inhale 2 puffs into the lungs every 4 (four) hours as needed for wheezing or shortness of breath. 18 g 11   atorvastatin (LIPITOR) 20 MG tablet TAKE 1 TABLET BY MOUTH EVERY DAY 90 tablet 1   Biotin 44034 MCG TABS Take 10 mg by mouth daily.      calcium-vitamin D (OSCAL WITH D) 500-200 MG-UNIT tablet Take 1 tablet by mouth.     Dextromethorphan-Guaifenesin (MUCINEX DM PO) Take 1 tablet by mouth daily.     Fluticasone-Umeclidin-Vilant (TRELEGY ELLIPTA) 100-62.5-25 MCG/ACT AEPB Inhale 1 puff into the lungs daily. 60 each 11   ipratropium-albuterol (DUONEB) 0.5-2.5 (3) MG/3ML SOLN Take 3 mLs by nebulization every 4 (four) hours as needed. 360 mL 6   loperamide  (IMODIUM A-D) 2 MG tablet Take 1 tablet (2 mg total) by mouth daily as needed for diarrhea or loose stools.  0   montelukast (SINGULAIR) 10 MG tablet TAKE 1 TABLET BY MOUTH EVERY DAY 90 tablet 3   Multiple Vitamins-Minerals (MULTIVITAMIN ADULT PO) Take 1 tablet by mouth.     Probiotic Product (PROBIOTIC-10 PO) Take 1 tablet by mouth.     TRELEGY ELLIPTA 100-62.5-25 MCG/INH AEPB INHALE 1 PUFF INTO THE LUNGS EVERY MORNING 60 each 2   vitamin B-12 (CYANOCOBALAMIN) 1000 MCG tablet Take 1,000 mcg by mouth daily.     No facility-administered medications prior to visit.     Per HPI unless specifically indicated in ROS section below Review of Systems  Objective:  BP 126/80   Pulse 76   Temp (!) 97.4 F (36.3 C) (Temporal)   Ht 5' 4.5" (1.638 m)   Wt 146 lb (66.2 kg)   SpO2 96%   BMI 24.67 kg/m   Wt Readings from Last 3 Encounters:  04/01/21 146 lb (66.2 kg)  03/24/21 146 lb 9.6 oz (66.5 kg)  08/07/20 148 lb 1 oz (67.2 kg)      Physical Exam Vitals and nursing note reviewed.  Constitutional:      Appearance: Normal appearance. She is not ill-appearing.  Musculoskeletal:  General: No swelling or tenderness. Normal range of motion.     Comments:  FROM bilateral shoulders No pain with movement of arm in [facility] joint No impingement  No pain to palpation of shoulders or humerus or elbow  Skin:    General: Skin is warm and dry.     Findings: No rash.     Comments: Few indurated lumps to inner upper arm at site of prior hematoma  Neurological:     General: No focal deficit present.     Mental Status: She is alert.  Psychiatric:        Mood and Affect: Mood normal.        Behavior: Behavior normal.       Assessment & Plan:  This visit occurred during the SARS-CoV-2 public health emergency.  Safety protocols were in place, including screening questions prior to the visit, additional usage of staff PPE, and extensive cleaning of exam room while observing appropriate contact time  as indicated for disinfecting solutions.   Problem List Items Addressed This Visit     Hematoma of arm, left, sequela - Primary    Anticipate residual calcifications after recent hematoma that has since healed. rec try warm compresses to area. No significant pain at this time. Update if enlarging or worsening.        Fall with injury    No premonitory symptoms. Sounds like mechanical fall. Encouraged ongoing caution in osteopenia history.         Meds ordered this encounter  Medications   mupirocin cream (BACTROBAN) 2 %    Sig: Apply 1 application topically 2 (two) times daily.    Dispense:  15 g    Refill:  0   No orders of the defined types were placed in this encounter.   Patient instructions: Sounds like you had hematoma or blood collection under the skin. Firmer lumps are likely residual calcifications from this hematoma. They may stay or may go away over time.  Recommend warm compresses to the harder spots to see if it will speed resolution.   Follow up plan: Return if symptoms worsen or fail to improve.  Eustaquio Boyden, MD

## 2021-04-01 NOTE — Assessment & Plan Note (Addendum)
No premonitory symptoms. Sounds like mechanical fall. Encouraged ongoing caution in osteopenia history.

## 2021-04-01 NOTE — Patient Instructions (Signed)
Sounds like you had hematoma or blood collection under the skin. Firmer lumps are likely residual calcifications from this hematoma. They may stay or may go away over time.  Recommend warm compresses to the harder spots to see if it will speed resolution.   Hematoma A hematoma is a collection of blood under the skin, in an organ, in a body space, in a joint space, or in other tissue. The blood can thicken (clot) to form a lump that you can see and feel. The lump is often firm and may become sore and tender. Most hematomas get better in a few days to weeks. However, some hematomas may be serious and require medical care. Hematomas can range from very small to very large. What are the causes? This condition is caused by: A blunt or penetrating injury. A leakage from a blood vessel under the skin. Some medical procedures, including surgeries, such as oral surgery, face lifts, and surgeries on the joints. Some medical conditions that cause bleeding or bruising. There may be multiple hematomas that appear in different areas of the body. What increases the risk? You are more likely to develop this condition if: You are an older adult. You use blood thinners. What are the signs or symptoms? Symptoms of this condition depend on where the hematoma is located.  Common symptoms of a hematoma that is under the skin include: A firm lump on the body. Pain and tenderness in the area. Bruising. Blue, dark blue, purple-red, or yellowish skin (discoloration) may appear at the site of the hematoma if the hematoma is close to the surface of the skin. Common symptoms of a hematoma that is deep in the tissues or body spaces may be less obvious. They include: A collection of blood in the stomach (intra-abdominal hematoma). This may cause pain in the abdomen, weakness, fainting, and shortness of breath. A collection of blood in the head (intracranial hematoma). This may cause a headache or symptoms such as weakness,  trouble speaking or understanding, or a change in consciousness.  How is this diagnosed? This condition is diagnosed based on: Your medical history. A physical exam. Imaging tests, such as an ultrasound or CT scan. These may be needed if your health care provider suspects a hematoma in deeper tissues or body spaces. Blood tests. These may be needed if your health care provider believes that the hematoma is caused by a medical condition. How is this treated? Treatment for this condition depends on the cause, size, and location of the hematoma. Treatment may include: Doing nothing. The majority of hematomas do not need treatment as many of them go away on their own over time. Surgery or close monitoring. This may be needed for large hematomas or hematomas that affect vital organs. Medicines. Medicines may be given if there is an underlying medical cause for the hematoma. Follow these instructions at home: Managing pain, stiffness, and swelling  If directed, put ice on the affected area. Put ice in a plastic bag. Place a towel between your skin and the bag. Leave the ice on for 20 minutes, 2-3 times a day for the first couple of days. If directed, apply heat to the affected area after applying ice for a couple of days. Use the heat source that your health care provider recommends, such as a moist heat pack or a heating pad. Place a towel between your skin and the heat source. Leave the heat on for 20-30 minutes. Remove the heat if your skin turns bright  red. This is especially important if you are unable to feel pain, heat, or cold. You may have a greater risk of getting burned. Raise (elevate) the affected area above the level of your heart while you are sitting or lying down. If told, wrap the affected area with an elastic bandage. The bandage applies pressure (compression) to the area, which may help to reduce swelling and promote healing. Do not wrap the bandage too tightly around the  affected area. If your hematoma is on a leg or foot (lower extremity) and is painful, your health care provider may recommend crutches. Use them as told by your health care provider. General instructions Take over-the-counter and prescription medicines only as told by your health care provider. Keep all follow-up visits as told by your health care provider. This is important. Contact a health care provider if: You have a fever. The swelling or discoloration gets worse. You develop more hematomas. Get help right away if: Your pain is worse or your pain is not controlled with medicine. Your skin over the hematoma breaks or starts bleeding. Your hematoma is in your chest or abdomen and you have weakness, shortness of breath, or a change in consciousness. You have a hematoma on your scalp that is caused by a fall or injury, and you also have: A headache that gets worse. Trouble speaking or understanding speech. Weakness. Change in alertness or consciousness. Summary A hematoma is a collection of blood under the skin, in an organ, in a body space, in a joint space, or in other tissue. This condition usually does not need treatment because many hematomas go away on their own over time. Large hematomas, or those that may affect vital organs, may need surgical drainage or monitoring. If the hematoma is caused by a medical condition, medicines may be prescribed. Get help right away if your hematoma breaks or starts to bleed, you have shortness of breath, or you have a headache or trouble speaking after a fall. This information is not intended to replace advice given to you by your health care provider. Make sure you discuss any questions you have with your health care provider. Document Revised: 10/12/2018 Document Reviewed: 10/21/2017 Elsevier Patient Education  2022 [employer].

## 2021-06-24 ENCOUNTER — Ambulatory Visit (INDEPENDENT_AMBULATORY_CARE_PROVIDER_SITE_OTHER): Payer: No Typology Code available for payment source | Admitting: Family Medicine

## 2021-06-24 ENCOUNTER — Encounter: Payer: Self-pay | Admitting: Family Medicine

## 2021-06-24 ENCOUNTER — Other Ambulatory Visit: Payer: Self-pay

## 2021-06-24 VITALS — BP 128/80 | HR 83 | Temp 97.7°F | Ht 64.5 in | Wt 145.3 lb

## 2021-06-24 DIAGNOSIS — J439 Emphysema, unspecified: Secondary | ICD-10-CM | POA: Diagnosis not present

## 2021-06-24 DIAGNOSIS — J069 Acute upper respiratory infection, unspecified: Secondary | ICD-10-CM

## 2021-06-24 DIAGNOSIS — J441 Chronic obstructive pulmonary disease with (acute) exacerbation: Secondary | ICD-10-CM

## 2021-06-24 DIAGNOSIS — J22 Unspecified acute lower respiratory infection: Secondary | ICD-10-CM | POA: Insufficient documentation

## 2021-06-24 LAB — POC COVID19 BINAXNOW: SARS Coronavirus 2 Ag: NEGATIVE

## 2021-06-24 MED ORDER — PREDNISONE 20 MG PO TABS: ORAL_TABLET | ORAL | 0 refills | Status: DC

## 2021-06-24 NOTE — Assessment & Plan Note (Addendum)
Anticipate viral URI with cough likely exacerbation COPD with increased wheezing - she notes cough is improving. No signs of bacterial infection at this time. COVID swab negative. Supportive measures reviewed.  Rx prednisone taper.  Update if not improving with treatment.  Pt agrees with plan.

## 2021-06-24 NOTE — Assessment & Plan Note (Signed)
Rx prednisone taper

## 2021-06-24 NOTE — Patient Instructions (Addendum)
COVID swab today - negative.  Likely viral respiratory infection.  Push fluids and rest.  Continue Trelegy.  Continue albuterol nebulizer treatments (duonebs) or albuterol inhaler 2 puffs every 4-6 hours as needed.  Take prednisone taper sent to pharmacy Let us know if not improving with treatment or any worsening symptom.

## 2021-06-24 NOTE — Progress Notes (Signed)
Patient ID: Stacey Durham, female    DOB: [birth year], 76 y.o.   MRN: [id number]  This visit was conducted in person.  BP 128/80    Pulse 83    Temp 97.7 F (36.5 C) (Temporal)    Ht 5' 4.5" (1.638 m)    Wt 145 lb 5 oz (65.9 kg)    SpO2 96%    BMI 24.56 kg/m    CC: URI symptoms with cough Subjective:   HPI: Stacey Durham is a 76 y.o. female presenting on 06/24/2021 for Sore Throat (C/o ST, runny nose and back pain.  Sxs started 06/20/21.)   4d h/o bad cough, runny nose, ST, back ache started yesterday. She stays short winded, more recently. Cough seems to have improved today. No increased wheezing.   No fevers/chills, abd pain, nausea, diarrhea.  No UTI symptoms.   No sick contacts at home.   Has not tested for COVID.  Last COVID infection was 06/2018. COVID vaccines - Pfizer 07/2019, 07/2019, booster 01/2020.   Treating with albuterol nebulizer, inhaler as well as alka seltzer cold and flu. Last albuterol neb treatment was 9:30am. Currently using about 4 times a daily, normally none.   Known COPD on daily trelegy - last saw Dr Belia Heman 03/2021     Relevant past medical, surgical, family and social history reviewed and updated as indicated. Interim medical history since our last visit reviewed. Allergies and medications reviewed and updated. Outpatient Medications Prior to Visit  Medication Sig Dispense Refill   albuterol (VENTOLIN HFA) 108 (90 Base) MCG/ACT inhaler Inhale 2 puffs into the lungs every 4 (four) hours as needed for wheezing or shortness of breath. 18 g 11   atorvastatin (LIPITOR) 20 MG tablet TAKE 1 TABLET BY MOUTH EVERY DAY 90 tablet 1   Biotin 36629 MCG TABS Take 10 mg by mouth daily.      calcium-vitamin D (OSCAL WITH D) 500-200 MG-UNIT tablet Take 1 tablet by mouth.     Dextromethorphan-Guaifenesin (MUCINEX DM PO) Take 1 tablet by mouth daily.     Fluticasone-Umeclidin-Vilant (TRELEGY ELLIPTA) 100-62.5-25 MCG/ACT AEPB Inhale 1 puff into the lungs daily.  60 each 11   ipratropium-albuterol (DUONEB) 0.5-2.5 (3) MG/3ML SOLN Take 3 mLs by nebulization every 4 (four) hours as needed. 360 mL 6   loperamide (IMODIUM A-D) 2 MG tablet Take 1 tablet (2 mg total) by mouth daily as needed for diarrhea or loose stools.  0   montelukast (SINGULAIR) 10 MG tablet TAKE 1 TABLET BY MOUTH EVERY DAY 90 tablet 3   Multiple Vitamins-Minerals (MULTIVITAMIN ADULT PO) Take 1 tablet by mouth.     mupirocin cream (BACTROBAN) 2 % Apply 1 application topically 2 (two) times daily. 15 g 0   Probiotic Product (PROBIOTIC-10 PO) Take 1 tablet by mouth.     TRELEGY ELLIPTA 100-62.5-25 MCG/INH AEPB INHALE 1 PUFF INTO THE LUNGS EVERY MORNING 60 each 2   vitamin B-12 (CYANOCOBALAMIN) 1000 MCG tablet Take 1,000 mcg by mouth daily.     No facility-administered medications prior to visit.     Per HPI unless specifically indicated in ROS section below Review of Systems  Objective:  BP 128/80    Pulse 83    Temp 97.7 F (36.5 C) (Temporal)    Ht 5' 4.5" (1.638 m)    Wt 145 lb 5 oz (65.9 kg)    SpO2 96%    BMI 24.56 kg/m   Wt Readings from Last 3 Encounters:  06/24/21  145 lb 5 oz (65.9 kg)  04/01/21 146 lb (66.2 kg)  03/24/21 146 lb 9.6 oz (66.5 kg)      Physical Exam Vitals and nursing note reviewed.  Constitutional:      Appearance: Normal appearance. She is not ill-appearing.  HENT:     Head: Normocephalic and atraumatic.     Right Ear: Tympanic membrane, ear canal and external ear normal. There is no impacted cerumen.     Left Ear: Tympanic membrane, ear canal and external ear normal. There is no impacted cerumen.  Eyes:     Extraocular Movements: Extraocular movements intact.     Conjunctiva/sclera: Conjunctivae normal.     Pupils: Pupils are equal, round, and reactive to light.  Cardiovascular:     Rate and Rhythm: Normal rate and regular rhythm.     Pulses: Normal pulses.     Heart sounds: Normal heart sounds. No murmur heard. Pulmonary:     Effort: Pulmonary  effort is normal. No respiratory distress.     Breath sounds: Decreased air movement present. Wheezing (R sided expiratory) present. No decreased breath sounds, rhonchi or rales.     Comments:  Lungs overall clear without significant wheezing Increased work of breathing Musculoskeletal:     Right lower leg: No edema.     Left lower leg: No edema.  Lymphadenopathy:     Head:     Right side of head: No submental, submandibular, tonsillar, preauricular or posterior auricular adenopathy.     Left side of head: No submental, submandibular, tonsillar, preauricular or posterior auricular adenopathy.     Cervical: No cervical adenopathy.     Right cervical: No superficial cervical adenopathy.    Left cervical: No superficial cervical adenopathy.     Upper Body:     Right upper body: No supraclavicular adenopathy.     Left upper body: No supraclavicular adenopathy.  Skin:    General: Skin is warm and dry.     Findings: No rash.  Neurological:     Mental Status: She is alert.  Psychiatric:        Mood and Affect: Mood normal.        Behavior: Behavior normal.      Results for orders placed or performed in visit on 06/24/21  POC COVID-19  Result Value Ref Range   SARS Coronavirus 2 Ag Negative Negative    Assessment & Plan:  This visit occurred during the SARS-CoV-2 public health emergency.  Safety protocols were in place, including screening questions prior to the visit, additional usage of staff PPE, and extensive cleaning of exam room while observing appropriate contact time as indicated for disinfecting solutions.   Problem List Items Addressed This Visit     Chronic obstructive pulmonary disease (HCC)   Relevant Medications   predniSONE (DELTASONE) 20 MG tablet   COPD exacerbation (HCC)    Rx prednisone taper      Relevant Medications   predniSONE (DELTASONE) 20 MG tablet   Acute respiratory infection - Primary    Anticipate viral URI with cough likely exacerbation COPD  with increased wheezing - she notes cough is improving. No signs of bacterial infection at this time. COVID swab negative. Supportive measures reviewed.  Rx prednisone taper.  Update if not improving with treatment.  Pt agrees with plan.       Relevant Orders   POC COVID-19 (Completed)     Meds ordered this encounter  Medications   predniSONE (DELTASONE) 20 MG tablet  Sig: Take two tablets daily for 3 days followed by one tablet daily for 4 days    Dispense:  10 tablet    Refill:  0   Orders Placed This Encounter  Procedures   POC COVID-19    Order Specific Question:   Previously tested for COVID-19    Answer:   Yes    Order Specific Question:   Resident in a congregate (group) care setting    Answer:   No    Order Specific Question:   Employed in healthcare setting    Answer:   No    Order Specific Question:   Pregnant    Answer:   No     Patient Instructions  COVID swab today - negative.  Likely viral respiratory infection.  Push fluids and rest.  Continue Trelegy.  Continue albuterol nebulizer treatments (duonebs) or albuterol inhaler 2 puffs every 4-6 hours as needed.  Take prednisone taper sent to pharmacy Let us know if not improving with treatment or any worsening symptom.    Follow up plan: Return if symptoms worsen or fail to improve.  Eustaquio Boyden, MD

## 2021-08-04 ENCOUNTER — Other Ambulatory Visit: Payer: Self-pay

## 2021-08-04 ENCOUNTER — Other Ambulatory Visit: Payer: Self-pay | Admitting: Family Medicine

## 2021-08-04 ENCOUNTER — Other Ambulatory Visit (INDEPENDENT_AMBULATORY_CARE_PROVIDER_SITE_OTHER): Payer: No Typology Code available for payment source

## 2021-08-04 DIAGNOSIS — E038 Other specified hypothyroidism: Secondary | ICD-10-CM | POA: Diagnosis not present

## 2021-08-04 DIAGNOSIS — R7303 Prediabetes: Secondary | ICD-10-CM

## 2021-08-04 DIAGNOSIS — N1831 Chronic kidney disease, stage 3a: Secondary | ICD-10-CM

## 2021-08-04 DIAGNOSIS — E782 Mixed hyperlipidemia: Secondary | ICD-10-CM

## 2021-08-04 LAB — CBC WITH DIFFERENTIAL/PLATELET
Basophils Absolute: 0 10*3/uL (ref 0.0–0.1)
Basophils Relative: 0.7 % (ref 0.0–3.0)
Eosinophils Absolute: 0.2 10*3/uL (ref 0.0–0.7)
Eosinophils Relative: 3.5 % (ref 0.0–5.0)
HCT: 40.6 % (ref 36.0–46.0)
Hemoglobin: 13.9 g/dL (ref 12.0–15.0)
Lymphocytes Relative: 36.4 % (ref 12.0–46.0)
Lymphs Abs: 2 10*3/uL (ref 0.7–4.0)
MCHC: 34.2 g/dL (ref 30.0–36.0)
MCV: 91.1 fl (ref 78.0–100.0)
Monocytes Absolute: 0.7 10*3/uL (ref 0.1–1.0)
Monocytes Relative: 12 % (ref 3.0–12.0)
Neutro Abs: 2.6 10*3/uL (ref 1.4–7.7)
Neutrophils Relative %: 47.4 % (ref 43.0–77.0)
Platelets: 235 10*3/uL (ref 150.0–400.0)
RBC: 4.45 Mil/uL (ref 3.87–5.11)
RDW: 13.3 % (ref 11.5–15.5)
WBC: 5.6 10*3/uL (ref 4.0–10.5)

## 2021-08-04 LAB — COMPREHENSIVE METABOLIC PANEL
ALT: 18 U/L (ref 0–35)
AST: 21 U/L (ref 0–37)
Albumin: 3.8 g/dL (ref 3.5–5.2)
Alkaline Phosphatase: 75 U/L (ref 39–117)
BUN: 21 mg/dL (ref 6–23)
CO2: 28 mEq/L (ref 19–32)
Calcium: 8.8 mg/dL (ref 8.4–10.5)
Chloride: 105 mEq/L (ref 96–112)
Creatinine, Ser: 1.06 mg/dL (ref 0.40–1.20)
GFR: 50.98 mL/min — ABNORMAL LOW (ref >60.00)
Glucose, Bld: 143 mg/dL — ABNORMAL HIGH (ref 70–99)
Potassium: 4.8 mEq/L (ref 3.5–5.1)
Sodium: 140 mEq/L (ref 135–145)
Total Bilirubin: 0.8 mg/dL (ref 0.2–1.2)
Total Protein: 6.2 g/dL (ref 6.0–8.3)

## 2021-08-04 LAB — LIPID PANEL
Cholesterol: 137 mg/dL (ref 0–200)
HDL: 59.9 mg/dL (ref >39.00)
LDL Cholesterol: 51 mg/dL (ref 0–99)
NonHDL: 76.78
Total CHOL/HDL Ratio: 2
Triglycerides: 131 mg/dL (ref 0.0–149.0)
VLDL: 26.2 mg/dL (ref 0.0–40.0)

## 2021-08-04 LAB — TSH: TSH: 5.67 u[IU]/mL — ABNORMAL HIGH (ref 0.35–5.50)

## 2021-08-04 LAB — HEMOGLOBIN A1C: Hgb A1c MFr Bld: 6.2 % (ref 4.6–6.5)

## 2021-08-04 LAB — VITAMIN D 25 HYDROXY (VIT D DEFICIENCY, FRACTURES): VITD: 51 ng/mL (ref 30.00–100.00)

## 2021-08-04 LAB — T4, FREE: Free T4: 0.88 ng/dL (ref 0.60–1.60)

## 2021-08-05 LAB — MICROALBUMIN / CREATININE URINE RATIO
Creatinine,U: 133.5 mg/dL
Microalb Creat Ratio: 0.7 mg/g (ref 0.0–30.0)
Microalb, Ur: 1 mg/dL (ref 0.0–1.9)

## 2021-08-05 LAB — PARATHYROID HORMONE, INTACT (NO CA): PTH: 49 pg/mL (ref 16–77)

## 2021-08-11 ENCOUNTER — Other Ambulatory Visit: Payer: Self-pay

## 2021-08-11 ENCOUNTER — Ambulatory Visit (INDEPENDENT_AMBULATORY_CARE_PROVIDER_SITE_OTHER): Payer: No Typology Code available for payment source | Admitting: Family Medicine

## 2021-08-11 ENCOUNTER — Encounter: Payer: Self-pay | Admitting: Family Medicine

## 2021-08-11 VITALS — BP 128/74 | HR 88 | Temp 98.1°F | Resp 16 | Ht 64.0 in | Wt 145.0 lb

## 2021-08-11 DIAGNOSIS — L989 Disorder of the skin and subcutaneous tissue, unspecified: Secondary | ICD-10-CM

## 2021-08-11 DIAGNOSIS — E038 Other specified hypothyroidism: Secondary | ICD-10-CM

## 2021-08-11 DIAGNOSIS — M8588 Other specified disorders of bone density and structure, other site: Secondary | ICD-10-CM

## 2021-08-11 DIAGNOSIS — R7303 Prediabetes: Secondary | ICD-10-CM

## 2021-08-11 DIAGNOSIS — Z0000 Encounter for general adult medical examination without abnormal findings: Secondary | ICD-10-CM

## 2021-08-11 DIAGNOSIS — I2510 Atherosclerotic heart disease of native coronary artery without angina pectoris: Secondary | ICD-10-CM

## 2021-08-11 DIAGNOSIS — Z87891 Personal history of nicotine dependence: Secondary | ICD-10-CM

## 2021-08-11 DIAGNOSIS — S40022S Contusion of left upper arm, sequela: Secondary | ICD-10-CM

## 2021-08-11 DIAGNOSIS — Z7189 Other specified counseling: Secondary | ICD-10-CM

## 2021-08-11 DIAGNOSIS — N1831 Chronic kidney disease, stage 3a: Secondary | ICD-10-CM

## 2021-08-11 DIAGNOSIS — J439 Emphysema, unspecified: Secondary | ICD-10-CM

## 2021-08-11 DIAGNOSIS — E782 Mixed hyperlipidemia: Secondary | ICD-10-CM

## 2021-08-11 DIAGNOSIS — I700 Atherosclerosis of aorta: Secondary | ICD-10-CM

## 2021-08-11 NOTE — Assessment & Plan Note (Signed)
Preventative protocols reviewed and updated unless pt declined. Discussed healthy diet and lifestyle.  

## 2021-08-11 NOTE — Assessment & Plan Note (Signed)

## 2021-08-11 NOTE — Assessment & Plan Note (Signed)
Advanced directive discussion - does not have, but working on this. Has signed advanced directive packet, needs to get notarized. HCPOA - partner Dallas Schimke. Asked to bring [place] a copy. Doesn't think would want intubation, wouldn't want prolonged life support.  ?

## 2021-08-11 NOTE — Patient Instructions (Addendum)
I'm sorry to hear about your dog!  ?If you don't get a letter from Dr Dulce Sellar by this summer, call his office to ask about colonoscopy plans.  ?For osteopenia - continue calcium, vitamin D and regular weight bearing exercises.  ?If interested, check with pharmacy about new 2 shot shingles series (shingrix). Also check about bivalent COVID vaccine.  ?Bring [place] a copy of your living will.  ?Return as needed or in 6 months for follow up visit.  ?Heating pad to left arm, may try topical over the counter voltaren gel 2-3 times daily as needed.  ?We will refer you to skin doctor for evaluation of left cheek lesion ? ?Health Maintenance After Age 31 ?After age 22, you are at a higher risk for certain long-term diseases and infections as well as injuries from falls. Falls are a major cause of broken bones and head injuries in people who are older than age 29. Getting regular preventive care can help to keep you healthy and well. Preventive care includes getting regular testing and making lifestyle changes as recommended by your health care provider. Talk with your health care provider about: ?Which screenings and tests you should have. A screening is a test that checks for a disease when you have no symptoms. ?A diet and exercise plan that is right for you. ?What should I know about screenings and tests to prevent falls? ?Screening and testing are the best ways to find a health problem early. Early diagnosis and treatment give you the best chance of managing medical conditions that are common after age 51. Certain conditions and lifestyle choices may make you more likely to have a fall. Your health care provider may recommend: ?Regular vision checks. Poor vision and conditions such as cataracts can make you more likely to have a fall. If you wear glasses, make sure to get your prescription updated if your vision changes. ?Medicine review. Work with your health care provider to regularly review all of the medicines you are  taking, including over-the-counter medicines. Ask your health care provider about any side effects that may make you more likely to have a fall. Tell your health care provider if any medicines that you take make you feel dizzy or sleepy. ?Strength and balance checks. Your health care provider may recommend certain tests to check your strength and balance while standing, walking, or changing positions. ?Foot health exam. Foot pain and numbness, as well as not wearing proper footwear, can make you more likely to have a fall. ?Screenings, including: ?Osteoporosis screening. Osteoporosis is a condition that causes the bones to get weaker and break more easily. ?Blood pressure screening. Blood pressure changes and medicines to control blood pressure can make you feel dizzy. ?Depression screening. You may be more likely to have a fall if you have a fear of falling, feel depressed, or feel unable to do activities that you used to do. ?Alcohol use screening. Using too much alcohol can affect your balance and may make you more likely to have a fall. ?Follow these instructions at home: ?Lifestyle ?Do not drink alcohol if: ?Your health care provider tells you not to drink. ?If you drink alcohol: ?Limit how much you have to: ?0-1 drink a day for women. ?0-2 drinks a day for men. ?Know how much alcohol is in your drink. In the [place] one drink equals one 12 oz bottle of beer (355 mL), one 5 oz glass of wine (148 mL), or one 1? oz glass of hard liquor (44 mL). ?  Do not use any products that contain nicotine or tobacco. These products include cigarettes, chewing tobacco, and vaping devices, such as e-cigarettes. If you need help quitting, ask your health care provider. ?Activity ? ?Follow a regular exercise program to stay fit. This will help you maintain your balance. Ask your health care provider what types of exercise are appropriate for you. ?If you need a cane or walker, use it as recommended by your health care provider. ?Wear  supportive shoes that have nonskid soles. ?Safety ? ?Remove any tripping hazards, such as rugs, cords, and clutter. ?Install safety equipment such as grab bars in bathrooms and safety rails on stairs. ?Keep rooms and walkways well-lit. ?General instructions ?Talk with your health care provider about your risks for falling. Tell your health care provider if: ?You fall. Be sure to tell your health care provider about all falls, even ones that seem minor. ?You feel dizzy, tiredness (fatigue), or off-balance. ?Take over-the-counter and prescription medicines only as told by your health care provider. These include supplements. ?Eat a healthy diet and maintain a healthy weight. A healthy diet includes low-fat dairy products, low-fat (lean) meats, and fiber from whole grains, beans, and lots of fruits and vegetables. ?Stay current with your vaccines. ?Schedule regular health, dental, and eye exams. ?Summary ?Having a healthy lifestyle and getting preventive care can help to protect your health and wellness after age 22. ?Screening and testing are the best way to find a health problem early and help you avoid having a fall. Early diagnosis and treatment give you the best chance for managing medical conditions that are more common for people who are older than age 41. ?Falls are a major cause of broken bones and head injuries in people who are older than age 42. Take precautions to prevent a fall at home. ?Work with your health care provider to learn what changes you can make to improve your health and wellness and to prevent falls. ?This information is not intended to replace advice given to you by your health care provider. Make sure you discuss any questions you have with your health care provider. ?Document Revised: 10/07/2020 Document Reviewed: 10/07/2020 ?Elsevier Patient Education ? 2022 Elsevier Inc. ? ?

## 2021-08-11 NOTE — Progress Notes (Unsigned)
Patient ID: Stacey Durham, female    DOB: 25-Jul-1945, 76 y.o.   MRN: [id number]  This visit was conducted in person.  BP 128/74    Pulse 88    Temp 98.1 F (36.7 C)    Resp 16    Ht 5\' 4"  (1.626 m)    Wt 145 lb (65.8 kg)    SpO2 96%    BMI 24.89 kg/m    CC: CPE/AMW Subjective:   HPI: Stacey Durham is a 76 y.o. female presenting on 08/11/2021 for Annual Exam   Did not see health advisor this year. Distressed today because 25 yo dog may be put to sleep today.   No results found.  Flowsheet Row Office Visit from 08/07/2020 in [facility]  PHQ-2 Total Score 0       Fall Risk  08/07/2020 08/04/2019 07/28/2019 07/21/2018 07/14/2017  Falls in the past year? - 1 0 0 No  Number falls in past yr: 1 0 0 - -  Injury with Fall? 0 1 0 - -  Risk for fall due to : - History of fall(s) No Fall Risks - -  Follow up - Falls prevention discussed Falls evaluation completed;Falls prevention discussed - -  Fall 03/2021 - injured L upper arm with residual hematoma, this week residual knot becoming sore   By the way - poorly healing spot on left cheek present for the past year.   Severe COPD with chronic dyspnea and wheezing, followed by pulm ([facility]) on singulair, trelegy, duonebs PRN. Has stopped using - [place]    Preventative: COLONOSCOPY 07/2016 [facility], ext hem, diverticulosis, rpt 5 yrs (Outlaw) Well woman exam - s/p hysterectomy Sep 22, 1976, part of an ovary remains.  Bre[facility] cancer screening - [facility]rocystic changes. DEXA T score -2.2 spine, -1.2 hip 2017-09-22). Takes 1 cal/vit D pill daily and MVI. Discussed weight bearing exercise.  Lung cancer screening - recent chest CT 12/2016 stable. Completed screening. Last chest CT 09-23-2019 Flu shot - yearly COVID vaccine Pfizer 07/2019, 07/2019, booster 01/2020  Tdap Sep 22, 2012 Pneumovax 03/2012, prevnar 06/2015 shingrix - discussed, to check with pharmacy  Advanced  directive discussion - does not have, but working on this. Has signed advanced directive packet, needs to get notarized. HCPOA - partner 07/2015. Asked to bring [employer] a copy. Doesn't think would want intubation, wouldn't want prolonged life support.  Seat belt use discussed Sunscreen use discussed. No changing moles on skin. Ex smoker quit 09/22/01 Alcohol - none  Dentist - doesn't see, has full dentures Eye exam yearly  Bowel - no constipation, occasional diarrhea Bladder - nocturia x2-3, urinary urge incontinence, wears pad constantly. This is not too bothersome. No stress incontinence symptoms.    Originally from [place] - husband passed away 09/23/10 Lives alone, 1 chihuahua GF of 2013 Occ: retired, worked at [employer]: enjoys going to [employer]: good water, fruits/vegetables daily     Relevant past medical, surgical, family and social history reviewed and updated as indicated. Interim medical history since our last visit reviewed. Allergies and medications reviewed and updated. Outpatient Medications Prior to Visit  Medication Sig Dispense Refill   albuterol (VENTOLIN HFA) 108 (90 Base) MCG/ACT inhaler Inhale 2 puffs into the lungs every 4 (four) hours as needed for wheezing or shortness of breath. 18 g 11   atorvastatin (LIPITOR) 20 MG tablet TAKE 1 TABLET BY  MOUTH EVERY DAY 90 tablet 1   Biotin 10000 MCG TABS Take 10 mg by mouth daily.      calcium-vitamin D (OSCAL WITH D) 500-200 MG-UNIT tablet Take 1 tablet by mouth.     Dextromethorphan-Guaifenesin (MUCINEX DM PO) Take 1 tablet by mouth daily.     Fluticasone-Umeclidin-Vilant (TRELEGY ELLIPTA) 100-62.5-25 MCG/ACT AEPB Inhale 1 puff into the lungs daily. 60 each 11   ipratropium-albuterol (DUONEB) 0.5-2.5 (3) MG/3ML SOLN Take 3 mLs by nebulization every 4 (four) hours as needed. 360 mL 6   loperamide (IMODIUM A-D) 2 MG tablet Take 1 tablet (2 mg total) by mouth daily as needed for diarrhea or loose  stools.  0   montelukast (SINGULAIR) 10 MG tablet TAKE 1 TABLET BY MOUTH EVERY DAY 90 tablet 3   Multiple Vitamins-Minerals (MULTIVITAMIN ADULT PO) Take 1 tablet by mouth.     mupirocin cream (BACTROBAN) 2 % Apply 1 application topically 2 (two) times daily. 15 g 0   predniSONE (DELTASONE) 20 MG tablet Take two tablets daily for 3 days followed by one tablet daily for 4 days 10 tablet 0   Probiotic Product (PROBIOTIC-10 PO) Take 1 tablet by mouth.     TRELEGY ELLIPTA 100-62.5-25 MCG/INH AEPB INHALE 1 PUFF INTO THE LUNGS EVERY MORNING 60 each 2   vitamin B-12 (CYANOCOBALAMIN) 1000 MCG tablet Take 1,000 mcg by mouth daily.     No facility-administered medications prior to visit.     Per HPI unless specifically indicated in ROS section below Review of Systems  Constitutional:  Negative for activity change, appetite change, chills, fatigue, fever and unexpected weight change.  HENT:  Negative for hearing loss.   Eyes:  Negative for visual disturbance.  Respiratory:  Positive for shortness of breath (chronic). Negative for cough, chest tightness and wheezing.   Cardiovascular:  Negative for chest pain, palpitations and leg swelling.  Gastrointestinal:  Negative for abdominal distention, abdominal pain, blood in stool, constipation, diarrhea, nausea and vomiting.  Endocrine: Negative for cold intolerance.       Denies hypothyroid symptoms  Genitourinary:  Negative for difficulty urinating and hematuria.  Musculoskeletal:  Negative for arthralgias, myalgias and neck pain.  Skin:  Negative for rash.  Neurological:  Positive for dizziness. Negative for seizures, syncope and headaches.  Hematological:  Negative for adenopathy. Does not bruise/bleed easily.  Psychiatric/Behavioral:  Negative for dysphoric mood. The patient is not nervous/anxious.    Objective:  BP 128/74    Pulse 88    Temp 98.1 F (36.7 C)    Resp 16    Ht 5\' 4"  (1.626 m)    Wt 145 lb (65.8 kg)    SpO2 96%    BMI 24.89 kg/m    Wt Readings from Last 3 Encounters:  08/11/21 145 lb (65.8 kg)  06/24/21 145 lb 5 oz (65.9 kg)  04/01/21 146 lb (66.2 kg)      Physical Exam Vitals and nursing note reviewed.  Constitutional:      Appearance: Normal appearance. She is not ill-appearing.  HENT:     Head: Normocephalic and atraumatic.     Right Ear: Tympanic membrane, ear canal and external ear normal. There is no impacted cerumen.     Left Ear: Tympanic membrane, ear canal and external ear normal. There is no impacted cerumen.  Eyes:     General:        Right eye: No discharge.        Left eye: No discharge.  Extraocular Movements: Extraocular movements intact.     Conjunctiva/sclera: Conjunctivae normal.     Pupils: Pupils are equal, round, and reactive to light.  Neck:     Thyroid: No thyroid mass or thyromegaly.     Vascular: No carotid bruit.  Cardiovascular:     Rate and Rhythm: Normal rate and regular rhythm.     Pulses: Normal pulses.     Heart sounds: Normal heart sounds. No murmur heard. Pulmonary:     Effort: Pulmonary effort is normal. No respiratory distress.     Breath sounds: Normal breath sounds. No wheezing, rhonchi or rales.  Abdominal:     General: Bowel sounds are normal. There is no distension.     Palpations: Abdomen is soft. There is no mass.     Tenderness: There is no abdominal tenderness. There is no guarding or rebound.     Hernia: No hernia is present.  Musculoskeletal:     Cervical back: Normal range of motion and neck supple. No rigidity.     Right lower leg: No edema.     Left lower leg: No edema.  Lymphadenopathy:     Cervical: No cervical adenopathy.  Skin:    General: Skin is warm and dry.     Findings: No rash.  Neurological:     General: No focal deficit present.     Mental Status: She is alert. Mental status is at baseline.     Comments:  Recall 3/3 Calculation 5/5 DLROW  Psychiatric:        Mood and Affect: Mood normal.        Behavior: Behavior normal.       Results for orders placed or performed in visit on 08/04/21  Parathyroid hormone, intact (no Ca)  Result Value Ref Range   PTH 49 16 - 77 pg/mL  Microalbumin / creatinine urine ratio  Result Value Ref Range   Microalb, Ur 1.0 0.0 - 1.9 mg/dL   Creatinine,U 834.1 mg/dL   Microalb Creat Ratio 0.7 0.0 - 30.0 mg/g  CBC with Differential/Platelet  Result Value Ref Range   WBC 5.6 4.0 - 10.5 K/uL   RBC 4.45 3.87 - 5.11 Mil/uL   Hemoglobin 13.9 12.0 - 15.0 g/dL   HCT 96.2 22.9 - 79.8 %   MCV 91.1 78.0 - 100.0 fl   MCHC 34.2 30.0 - 36.0 g/dL   RDW 92.1 19.4 - 17.4 %   Platelets 235.0 150.0 - 400.0 K/uL   Neutrophils Relative % 47.4 43.0 - 77.0 %   Lymphocytes Relative 36.4 12.0 - 46.0 %   Monocytes Relative 12.0 3.0 - 12.0 %   Eosinophils Relative 3.5 0.0 - 5.0 %   Basophils Relative 0.7 0.0 - 3.0 %   Neutro Abs 2.6 1.4 - 7.7 K/uL   Lymphs Abs 2.0 0.7 - 4.0 K/uL   Monocytes Absolute 0.7 0.1 - 1.0 K/uL   Eosinophils Absolute 0.2 0.0 - 0.7 K/uL   Basophils Absolute 0.0 0.0 - 0.1 K/uL  VITAMIN D 25 Hydroxy (Vit-D Deficiency, Fractures)  Result Value Ref Range   VITD 51.00 30.00 - 100.00 ng/mL  Hemoglobin A1c  Result Value Ref Range   Hgb A1c MFr Bld 6.2 4.6 - 6.5 %  T4, free  Result Value Ref Range   Free T4 0.88 0.60 - 1.60 ng/dL  TSH  Result Value Ref Range   TSH 5.67 (H) 0.35 - 5.50 uIU/mL  Comprehensive metabolic panel  Result Value Ref Range   Sodium 140  135 - 145 mEq/L   Potassium 4.8 3.5 - 5.1 mEq/L   Chloride 105 96 - 112 mEq/L   CO2 28 19 - 32 mEq/L   Glucose, Bld 143 (H) 70 - 99 mg/dL   BUN 21 6 - 23 mg/dL   Creatinine, Ser 1.61 0.40 - 1.20 mg/dL   Total Bilirubin 0.8 0.2 - 1.2 mg/dL   Alkaline Phosphatase 75 39 - 117 U/L   AST 21 0 - 37 U/L   ALT 18 0 - 35 U/L   Total Protein 6.2 6.0 - 8.3 g/dL   Albumin 3.8 3.5 - 5.2 g/dL   GFR 09.60 (L) >45.40 mL/min   Calcium 8.8 8.4 - 10.5 mg/dL  Lipid panel  Result Value Ref Range   Cholesterol 137 0 - 200  mg/dL   Triglycerides 981.1 0.0 - 149.0 mg/dL   HDL 91.47 >82.95 mg/dL   VLDL 62.1 0.0 - 30.8 mg/dL   LDL Cholesterol 51 0 - 99 mg/dL   Total CHOL/HDL Ratio 2    NonHDL 76.78     Assessment & Plan:  This visit occurred during the SARS-CoV-2 public health emergency.  Safety protocols were in place, including screening questions prior to the visit, additional usage of staff PPE, and extensive cleaning of exam room while observing appropriate contact time as indicated for disinfecting solutions.   Problem List Items Addressed This Visit     Health maintenance examination (Chronic)    Preventative protocols reviewed and updated unless pt declined. Discussed healthy diet and lifestyle.       Advanced care planning/counseling discussion (Chronic)    Advanced directive discussion - does not have, but working on this. Has signed advanced directive packet, needs to get notarized. HCPOA - partner [employer]. Asked to bring [place] a copy. Doesn't think would want intubation, wouldn't want prolonged life support.       Medicare annual wellness visit, subsequent - Primary (Chronic)    I have personally reviewed the Medicare Annual Wellness questionnaire and have noted 1. The patient's medical and social history 2. Their use of alcohol, tobacco or illicit drugs 3. Their current medications and supplements 4. The patient's functional ability including ADL's, fall risks, home safety risks and hearing or visual impairment. Cognitive function has been assessed and addressed as indicated.  5. Diet and physical activity 6. Evidence for depression or mood disorders The patients weight, height, BMI have been recorded in the chart. I have made referrals, counseling and provided education to the patient based on review of the above and I have provided the pt with a written personalized care plan for preventive services. Provider list updated.. See scanned questionairre as needed for further  documentation. Reviewed preventative protocols and updated unless pt declined.       Other Visit Diagnoses     Skin lesion of cheek       Relevant Orders   Ambulatory referral to Dermatology        No orders of the defined types were placed in this encounter.  Orders Placed This Encounter  Procedures   Ambulatory referral to Dermatology    Referral Priority:   Routine    Referral Type:   Consultation    Referral Reason:   Specialty Services Required    Requested Specialty:   Dermatology    Number of Visits Requested:   1     Patient instructions: I'm sorry to hear about your dog!  If you don't get a letter from Dr Dulce Sellar  by this summer, call his office to ask about colonoscopy plans.  For osteopenia - continue calcium, vitamin D and regular weight bearing exercises.  If interested, check with pharmacy about new 2 shot shingles series (shingrix). Also check about bivalent COVID vaccine.  Bring [place] a copy of your living will.  Return as needed or in 6 months for follow up visit.  Heating pad to left arm, may try topical over the counter voltaren gel 2-3 times daily as needed.  We will refer you to skin doctor for evaluation of left cheek lesion  Follow up plan: Return in about 6 months (around 02/11/2022) for follow up visit.  Eustaquio Boyden, MD

## 2021-08-12 DIAGNOSIS — L989 Disorder of the skin and subcutaneous tissue, unspecified: Secondary | ICD-10-CM | POA: Insufficient documentation

## 2021-08-12 NOTE — Assessment & Plan Note (Signed)
Discussed cal/vit D intake and recommend regular weight bearing exercise. Consider updating DEXA next year (last done 08/2017).  ?

## 2021-08-12 NOTE — Assessment & Plan Note (Addendum)
Noted on imaging Continue statin 

## 2021-08-12 NOTE — Assessment & Plan Note (Signed)
Encouraged limiting added sugar in diet.  

## 2021-08-12 NOTE — Assessment & Plan Note (Addendum)
GFR remains 50s. Encouraged good water intake and limiting NSAIDs. CBC, PTH, vit D normal.  ?

## 2021-08-12 NOTE — Assessment & Plan Note (Addendum)
Chronic, well controlled on atorvastatin. ?The 10-year ASCVD risk score (Arnett DK, et al., 2019) is: 17.6% ?  Values used to calculate the score: ?    Age: 76 years ?    Sex: Female ?    Is Non-Hispanic African American: No ?    Diabetic: No ?    Tobacco smoker: No ?    Systolic Blood Pressure: 0000000 mmHg ?    Is BP treated: No ?    HDL Cholesterol: 59.9 mg/dL ?    Total Cholesterol: 137 mg/dL  ?

## 2021-08-12 NOTE — Assessment & Plan Note (Signed)
TSH remains elevated, fT4 normal. Denies hypothyroid symptoms. Will continue to monitor.  ?

## 2021-08-12 NOTE — Assessment & Plan Note (Signed)
Quit smoking 2003, remains abstinent.  ?

## 2021-08-12 NOTE — Assessment & Plan Note (Signed)
Severe, continues current drug regimen (trelegy, singulair, duonebs) with benefit. Sees pulm regularly (Dr Belia Heman) ?

## 2021-08-12 NOTE — Assessment & Plan Note (Signed)
Continue atorvastatin

## 2021-08-12 NOTE — Assessment & Plan Note (Signed)
Ongoing nodule at prior site of injury - rec heating had and topical voltaren gel  ?

## 2021-08-12 NOTE — Assessment & Plan Note (Signed)
Poorly healing area present to left cheek for about a year. Will refer to derm for evaluation, r/o skin cancer. ?

## 2021-09-03 ENCOUNTER — Other Ambulatory Visit: Payer: Self-pay

## 2021-09-03 ENCOUNTER — Ambulatory Visit
Admission: RE | Admit: 2021-09-03 | Discharge: 2021-09-03 | Disposition: A | Discharge status: Home / Self Care | Payer: No Typology Code available for payment source | Source: Ambulatory Visit | Attending: Family | Admitting: Family

## 2021-09-03 ENCOUNTER — Ambulatory Visit (INDEPENDENT_AMBULATORY_CARE_PROVIDER_SITE_OTHER): Payer: No Typology Code available for payment source | Admitting: Family

## 2021-09-03 ENCOUNTER — Other Ambulatory Visit: Payer: Self-pay | Admitting: Family

## 2021-09-03 ENCOUNTER — Encounter: Payer: Self-pay | Admitting: Family

## 2021-09-03 ENCOUNTER — Telehealth: Payer: Self-pay | Admitting: Family Medicine

## 2021-09-03 VITALS — BP 118/76 | HR 73 | Temp 97.6°F | Resp 16 | Ht 64.0 in | Wt 146.3 lb

## 2021-09-03 DIAGNOSIS — R109 Unspecified abdominal pain: Secondary | ICD-10-CM | POA: Insufficient documentation

## 2021-09-03 DIAGNOSIS — N390 Urinary tract infection, site not specified: Secondary | ICD-10-CM

## 2021-09-03 DIAGNOSIS — K5730 Diverticulosis of large intestine without perforation or abscess without bleeding: Secondary | ICD-10-CM | POA: Diagnosis not present

## 2021-09-03 DIAGNOSIS — R350 Frequency of micturition: Secondary | ICD-10-CM | POA: Diagnosis not present

## 2021-09-03 DIAGNOSIS — R911 Solitary pulmonary nodule: Secondary | ICD-10-CM | POA: Insufficient documentation

## 2021-09-03 DIAGNOSIS — R319 Hematuria, unspecified: Secondary | ICD-10-CM | POA: Diagnosis not present

## 2021-09-03 LAB — CBC WITH DIFFERENTIAL/PLATELET
Basophils Absolute: 0 10*3/uL (ref 0.0–0.1)
Basophils Relative: 0.8 % (ref 0.0–3.0)
Eosinophils Absolute: 0.2 10*3/uL (ref 0.0–0.7)
Eosinophils Relative: 3.3 % (ref 0.0–5.0)
HCT: 41 % (ref 36.0–46.0)
Hemoglobin: 13.8 g/dL (ref 12.0–15.0)
Lymphocytes Relative: 28.7 % (ref 12.0–46.0)
Lymphs Abs: 1.5 10*3/uL (ref 0.7–4.0)
MCHC: 33.7 g/dL (ref 30.0–36.0)
MCV: 92 fl (ref 78.0–100.0)
Monocytes Absolute: 0.6 10*3/uL (ref 0.1–1.0)
Monocytes Relative: 12.7 % — ABNORMAL HIGH (ref 3.0–12.0)
Neutro Abs: 2.8 10*3/uL (ref 1.4–7.7)
Neutrophils Relative %: 54.5 % (ref 43.0–77.0)
Platelets: 230 10*3/uL (ref 150.0–400.0)
RBC: 4.46 Mil/uL (ref 3.87–5.11)
RDW: 13 % (ref 11.5–15.5)
WBC: 5.1 10*3/uL (ref 4.0–10.5)

## 2021-09-03 LAB — POC URINALSYSI DIPSTICK (AUTOMATED)
Bilirubin, UA: NEGATIVE
Blood, UA: NEGATIVE
Glucose, UA: NEGATIVE
Ketones, UA: NEGATIVE
Nitrite, UA: NEGATIVE
Protein, UA: NEGATIVE
Spec Grav, UA: 1.02 (ref 1.010–1.025)
Urobilinogen, UA: 0.2 E.U./dL
pH, UA: 5 (ref 5.0–8.0)

## 2021-09-03 LAB — BASIC METABOLIC PANEL
BUN: 22 mg/dL (ref 6–23)
CO2: 26 mEq/L (ref 19–32)
Calcium: 9.4 mg/dL (ref 8.4–10.5)
Chloride: 105 mEq/L (ref 96–112)
Creatinine, Ser: 0.96 mg/dL (ref 0.40–1.20)
GFR: 57.39 mL/min — ABNORMAL LOW (ref >60.00)
Glucose, Bld: 99 mg/dL (ref 70–99)
Potassium: 4 mEq/L (ref 3.5–5.1)
Sodium: 139 mEq/L (ref 135–145)

## 2021-09-03 MED ORDER — CIPROFLOXACIN HCL 500 MG PO TABS: 500.0000 mg | ORAL_TABLET | Freq: Two times a day (BID) | ORAL | 0 refills | Status: AC

## 2021-09-03 NOTE — Progress Notes (Signed)
? ?Established Patient Office Visit ? ?Subjective:  ?Patient ID: Stacey Durham, female    DOB: 26-Oct-1945  Age: 76 y.o. MRN: [id number] ? ?CC:  ?Chief Complaint  ?Patient presents with  ? Back Pain  ?  Left side and in the middle X 5 days  ? ? ?HPI ?Stacey Durham is here today with concerns. ? ?Started with severe pain on the right middle back pain. The pain is intermittent. As the day goes on the pain increases. Heating pad will improve the pain.  ? ?No dysuria. Does drink extra water but does have urinary frequency. Doe shave some urinary urgency but this isn't abnormal. No fever or chills. She has noticed some red in her urine but cleared up with drinking extra water.  ? ?Pain is not aggravated by movement.  ?No injury or trauma to site.   ? ? ?Past Medical History:  ?Diagnosis Date  ? Acute cholecystitis   ? Asthma   ? Cholecystitis with cholelithiasis 10/11/2015  ? COPD (chronic obstructive pulmonary disease) (HCC)   ? Ex-smoker   ? quit 2000s  ? Pneumonia 11/26/2015  ? ? ?Past Surgical History:  ?Procedure Laterality Date  ? ABDOMINAL HYSTERECTOMY  1978  ? part of an ovary remained  ? APPENDECTOMY  1978  ? CATARACT EXTRACTION, BILATERAL  2013  ? CHOLECYSTECTOMY N/A 10/11/2015  ? Procedure: LAPAROSCOPIC CHOLECYSTECTOMY;  Surgeon: Leafy Roiego F Pabon, MD;  Location: [facility] ORS;  Service: General;  Laterality: N/A;  ? COLONOSCOPY  07/2016  ? HP, ext hem, diverticulosis, rpt 5 yrs (Outlaw)  ? TONSILLECTOMY    ? ? ?Family History  ?Problem Relation Age of Onset  ? Heart attack Father [id number]  ? Diabetes Father   ? Diabetes Mother   ? Stroke Neg Hx   ? Cancer Neg Hx   ? ? ?Social History  ? ?Socioeconomic History  ? Marital status: Widowed  ?  Spouse name: Not on file  ? Number of children: Not on file  ? Years of education: Not on file  ? Highest education level: Not on file  ?Occupational History  ? Not on file  ?Tobacco Use  ? Smoking status: Former  ?  Packs/day: 1.25  ?  Years: 40.00  ?  Pack years: 50.00  ?  Types:  Cigarettes  ?  Start date: 1964  ?  Quit date: 2003  ?  Years since quitting: 20.2  ? Smokeless tobacco: Never  ?Vaping Use  ? Vaping Use: Never used  ?Substance and Sexual Activity  ? Alcohol use: No  ? Drug use: No  ? Sexual activity: Not on file  ?Other Topics Concern  ? Not on file  ?Social History Narrative  ? [place]  ? Widower - husband passed away 2012  ? Lives alone, 1 chihuahua  ? GF of Page SpiroKen Isley  ? Occ: retired, worked at [employer]  ? Activity: enjoys going to beach  ? Diet: good water, fruits/vegetables daily  ? ?Social Determinants of Health  ? ?Financial Resource Strain: Not on file  ?Food Insecurity: Not on file  ?Transportation Needs: Not on file  ?Physical Activity: Not on file  ?Stress: Not on file  ?Social Connections: Not on file  ?Intimate Partner Violence: Not on file  ? ? ?Outpatient Medications Prior to Visit  ?Medication Sig Dispense Refill  ? albuterol (VENTOLIN HFA) 108 (90 Base) MCG/ACT inhaler Inhale 2 puffs into the lungs every 4 (four) hours as needed for wheezing or  shortness of breath. 18 g 11  ? atorvastatin (LIPITOR) 20 MG tablet TAKE 1 TABLET BY MOUTH EVERY DAY 90 tablet 1  ? Biotin 24097 MCG TABS Take 10 mg by mouth daily.     ? calcium-vitamin D (OSCAL WITH D) 500-200 MG-UNIT tablet Take 1 tablet by mouth.    ? Dextromethorphan-Guaifenesin (MUCINEX DM PO) Take 1 tablet by mouth daily.    ? Fluticasone-Umeclidin-Vilant (TRELEGY ELLIPTA) 100-62.5-25 MCG/ACT AEPB Inhale 1 puff into the lungs daily. 60 each 11  ? ipratropium-albuterol (DUONEB) 0.5-2.5 (3) MG/3ML SOLN Take 3 mLs by nebulization every 4 (four) hours as needed. 360 mL 6  ? loperamide (IMODIUM A-D) 2 MG tablet Take 1 tablet (2 mg total) by mouth daily as needed for diarrhea or loose stools.  0  ? montelukast (SINGULAIR) 10 MG tablet TAKE 1 TABLET BY MOUTH EVERY DAY 90 tablet 3  ? Multiple Vitamins-Minerals (MULTIVITAMIN ADULT PO) Take 1 tablet by mouth.    ? mupirocin cream (BACTROBAN) 2 % Apply  1 application topically 2 (two) times daily. 15 g 0  ? predniSONE (DELTASONE) 20 MG tablet Take two tablets daily for 3 days followed by one tablet daily for 4 days 10 tablet 0  ? Probiotic Product (PROBIOTIC-10 PO) Take 1 tablet by mouth.    ? TRELEGY ELLIPTA 100-62.5-25 MCG/INH AEPB INHALE 1 PUFF INTO THE LUNGS EVERY MORNING 60 each 2  ? vitamin B-12 (CYANOCOBALAMIN) 1000 MCG tablet Take 1,000 mcg by mouth daily.    ? ?No facility-administered medications prior to visit.  ? ? ?No Known Allergies ? ?ROS ?Review of Systems  ?Constitutional:  Negative for chills and fever.  ?Gastrointestinal:  Negative for abdominal pain.  ?Genitourinary:  Positive for dysuria, flank pain (left sided), frequency, hematuria and urgency. Negative for difficulty urinating, pelvic pain and vaginal discharge.  ?Musculoskeletal:  Positive for arthralgias.  ? ?  ?Objective:  ?  ?Physical Exam ?Constitutional:   ?   General: She is not in acute distress. ?   Appearance: Normal appearance. She is normal weight. She is not ill-appearing, toxic-appearing or diaphoretic.  ?Cardiovascular:  ?   Rate and Rhythm: Normal rate and regular rhythm.  ?Pulmonary:  ?   Effort: Pulmonary effort is normal.  ?   Breath sounds: Normal breath sounds.  ?Abdominal:  ?   General: Abdomen is flat.  ?   Tenderness: There is no abdominal tenderness.  ?Musculoskeletal:  ?   Lumbar back: Tenderness (right flank pain mod to severe tenderness) present.  ?   Comments: Left CVA tenderness  ?Skin: ?   General: Skin is warm.  ?Neurological:  ?   Mental Status: She is alert.  ? ? ?BP 118/76   Pulse 73   Temp 97.6 ?F (36.4 ?C)   Resp 16   Ht 5\' 4"  (1.626 m)   Wt 146 lb 5 oz (66.4 kg)   SpO2 97%   BMI 25.11 kg/m?  ?Wt Readings from Last 3 Encounters:  ?09/03/21 146 lb 5 oz (66.4 kg)  ?08/11/21 145 lb (65.8 kg)  ?06/24/21 145 lb 5 oz (65.9 kg)  ? ? ? ?Health Maintenance Due  ?Topic Date Due  ? Zoster Vaccines- Shingrix (1 of 2) Never done  ? COVID-19 Vaccine (4 - Booster  for Pfizer series) 04/05/2020  ? COLONOSCOPY (Pts 45-14yrs Insurance coverage will need to be confirmed)  08/12/2021  ? ? ?There are no preventive care reminders to display for this patient. ? ?Lab Results  ?Component Value  Date  ? TSH 5.67 (H) 08/04/2021  ? ?Lab Results  ?Component Value Date  ? WBC 5.1 09/03/2021  ? HGB 13.8 09/03/2021  ? HCT 41.0 09/03/2021  ? MCV 92.0 09/03/2021  ? PLT 230.0 09/03/2021  ? ?Lab Results  ?Component Value Date  ? NA 139 09/03/2021  ? K 4.0 09/03/2021  ? CO2 26 09/03/2021  ? GLUCOSE 99 09/03/2021  ? BUN 22 09/03/2021  ? CREATININE 0.96 09/03/2021  ? BILITOT 0.8 08/04/2021  ? ALKPHOS 75 08/04/2021  ? AST 21 08/04/2021  ? ALT 18 08/04/2021  ? PROT 6.2 08/04/2021  ? ALBUMIN 3.8 08/04/2021  ? CALCIUM 9.4 09/03/2021  ? ANIONGAP 9 12/10/2019  ? GFR 57.39 (L) 09/03/2021  ? ?Lab Results  ?Component Value Date  ? HGBA1C 6.2 08/04/2021  ? ? ?  ?Assessment & Plan:  ? ?Problem List Items Addressed This Visit   ? ?  ? Genitourinary  ? Urinary tract infection without complication  ?  Worried about pyelonephritis vs kidney stone.  ?Ordered poct dipstick, reviewed. ?Ordered urine culure pending results.  ?Ordered stat ct abd pelvis pending results ?antbx sent to pharmacy, pt to take as directed. Encouraged increased water intake throughout the day. Urine culture/reflex pending results. Choosing to treat due to being symptomatic. If no improvement in the next 2 days pt advised to let me know. ? ?  ?  ? Relevant Medications  ? ciprofloxacin (CIPRO) 500 MG tablet  ?  ? Other  ? Left flank pain - Primary  ?  pyelonephritis vs kidney stone ?Stat ct abd pelvis ordered to r/o ?Pt advised to immediately go to er if pain worsens and or inability to void and or fever ?rx ciprofloxacin 500 mg ?  ?  ? Relevant Medications  ? ciprofloxacin (CIPRO) 500 MG tablet  ? Other Relevant Orders  ? CT Abdomen Pelvis Wo Contrast (Completed)  ? Urine Culture (Completed)  ? POCT Urinalysis Dipstick (Automated) (Completed)   ? Basic metabolic panel (Completed)  ? CBC with Differential (Completed)  ? Urinary frequency  ?  poct urine dipstick ordered and reviewed in office. ?  ?  ? ? ?Meds ordered this encounter  ?Medications

## 2021-09-03 NOTE — Patient Instructions (Signed)
We are pending authorization for a CAT scan to see if you have a kidney infection and or kidney stone. Please have your phone on your as they will call today.  ? ?It was a pleasure seeing you today.  ? ?You were found to have a urinary tract infection, you have been prescribed an antibiotic to your preferred pharmacy. Please start antibiotic today as directed.  ? ?We are sending your urine for a culture to make sure you do not have a resistant bacteria. We will call you if we need to change your medications.  ? ?Please make sure you are drinking plenty of fluids over the next few days. ? ?If your symptoms do not improve over the next 5-7 days, or if they worsen, please let us know. Please also let us know if you have worsening back pain, fevers, chills, or body aches.  ? ?Regards,  ? ?Louine Tenpenny ? ?

## 2021-09-03 NOTE — Progress Notes (Signed)
FYI: Called and spoke with pt new findings of 7 mm irregular pulm nodule and rll nodular opacities new since 7/21 imaging. Order in process for stat Ct chest low dose for further evaluation.  ? ?Pt still advised to take antibiotic as concern for kidney infection.  ?

## 2021-09-04 LAB — URINE CULTURE
MICRO NUMBER:: 13226047
SPECIMEN QUALITY:: ADEQUATE

## 2021-09-04 NOTE — Progress Notes (Signed)
Patient already notified about finding with the nodule so no need to repeat this to her. ? ?However please let her know that there were seen some calcifications in the aorta.  This is likely from plaque from cholesterol so advised patient to work on low-cholesterol diet and continue atorvastatin and follow-up with primary as indicated for to monitor these levels.

## 2021-09-04 NOTE — Progress Notes (Signed)
Kidney function is stable.  

## 2021-09-05 ENCOUNTER — Ambulatory Visit
Admission: RE | Admit: 2021-09-05 | Discharge: 2021-09-05 | Disposition: A | Discharge status: Home / Self Care | Payer: No Typology Code available for payment source | Source: Ambulatory Visit | Attending: Family | Admitting: Family

## 2021-09-05 DIAGNOSIS — R911 Solitary pulmonary nodule: Secondary | ICD-10-CM | POA: Insufficient documentation

## 2021-09-05 DIAGNOSIS — R918 Other nonspecific abnormal finding of lung field: Secondary | ICD-10-CM | POA: Diagnosis not present

## 2021-09-05 DIAGNOSIS — J439 Emphysema, unspecified: Secondary | ICD-10-CM | POA: Diagnosis not present

## 2021-09-07 DIAGNOSIS — R109 Unspecified abdominal pain: Secondary | ICD-10-CM | POA: Insufficient documentation

## 2021-09-07 DIAGNOSIS — N390 Urinary tract infection, site not specified: Secondary | ICD-10-CM | POA: Insufficient documentation

## 2021-09-07 DIAGNOSIS — R350 Frequency of micturition: Secondary | ICD-10-CM | POA: Insufficient documentation

## 2021-09-07 NOTE — Assessment & Plan Note (Signed)
poct urine dipstick ordered and reviewed in office. ?

## 2021-09-07 NOTE — Assessment & Plan Note (Signed)
Worried about pyelonephritis vs kidney stone.  ?Ordered poct dipstick, reviewed. ?Ordered urine culure pending results.  ?Ordered stat ct abd pelvis pending results ?antbx sent to pharmacy, pt to take as directed. Encouraged increased water intake throughout the day. Urine culture/reflex pending results. Choosing to treat due to being symptomatic. If no improvement in the next 2 days pt advised to let me know. ? ?

## 2021-09-07 NOTE — Assessment & Plan Note (Addendum)
pyelonephritis vs kidney stone ?Stat ct abd pelvis ordered to r/o ?Pt advised to immediately go to er if pain worsens and or inability to void and or fever ?rx ciprofloxacin 500 mg ?

## 2021-09-08 ENCOUNTER — Other Ambulatory Visit: Payer: Self-pay | Admitting: Family

## 2021-09-08 DIAGNOSIS — Z515 Encounter for palliative care: Secondary | ICD-10-CM | POA: Insufficient documentation

## 2021-09-08 DIAGNOSIS — M549 Dorsalgia, unspecified: Secondary | ICD-10-CM

## 2021-09-08 MED ORDER — TRAMADOL HCL 50 MG PO TABS: 50.0000 mg | ORAL_TABLET | Freq: Three times a day (TID) | ORAL | 0 refills | Status: AC | PRN

## 2021-09-08 NOTE — Progress Notes (Signed)
I did call the patient and give her the results of her most recent CT of the chest I have advised her to call Dr. Clovis Fredrickson office and let them know that she had an abnormal CT of the chest and needs to be seen more urgently.  I have also CCed Dr. Belia Heman (her pulmonologist) the imaging result.  She does still state that she is in some pain in the left upper to mid back, not aggravated by movement.  The CT of the abdomen and pelvis was nonconcerning for anything kidney related so I am concerned this may be coming from the left lower lobe with a new pulmonary nodule.  I have spoken with Dr. Sharen Hones as well who is her primary care and he is okay with her taking tramadol.  PDMP reviewed and tramadol sent to the pharmacy and patient advised to take this as needed for the pain.

## 2021-09-08 NOTE — Progress Notes (Signed)
Dr. Belia Heman,  ?I saw your patent in the office 4/5 for some left mid upper back pain, ordered CT, and as follows.  ? ?09/03/21 Ct abd/pelvis revealed:  ?Lower chest: There is a 7 mm nodule with irregular/spiculated ?margins in the left lower lobe (4-107), not present on the CT chest ?from 12/10/2019 (ordered CT chest, findings as below) ? ?09/05/21 ordered chest CT, please see attached.  ?Pt also with moderate left flank/mid back pain, urine culture negative ct abd/pelvis unrevealing for any kidney concern. Worry is pain is from left lower lobe lung where new nodules were found. Sees you as a patient. Can you get patient in more urgently for eval? New pulmonary nodules bil however biggest concern is largest 1.2 cm left lower lobe.

## 2021-09-09 ENCOUNTER — Telehealth: Payer: Self-pay | Admitting: Internal Medicine

## 2021-09-09 DIAGNOSIS — R918 Other nonspecific abnormal finding of lung field: Secondary | ICD-10-CM

## 2021-09-09 NOTE — Telephone Encounter (Signed)
Spoke with Dr. Patsey Berthold as Dr. Mortimer Fries has no availability until June.  She looked at the CT scan that was done 09/05/21, the recommendation per the radiologist was a f/u CT scan in 3 months.  Dr. Patsey Berthold stated it is ok to schedule her with a NP and order the f/u CT scan in July on the 7th or after.  CT ordered. ? ?Called and spoke with patient.  Advised I could get her to see a NP since Dr. Mortimer Fries did not have any openings until June.  She was offered 5/19 to see Geraldo Pitter NP.  At first she was upset about having to wait that long stating that this was cancer we were talking about not a general f/u.  I advised her that Dr. Patsey Berthold, one of our other pulmonologist had reviewed her CT scan and the recommendations was a f/u CT scan in July (which we would go ahead and order) and that it was fine to have her see a NP in May as there is no evidence that the nodules are cancerous.  She stated she would come in to see the NP on 5/19 at 9 am, advised to arrive at 8:45 am for check in.  Nothing further needed. ?

## 2021-09-17 ENCOUNTER — Ambulatory Visit (INDEPENDENT_AMBULATORY_CARE_PROVIDER_SITE_OTHER): Payer: No Typology Code available for payment source | Admitting: Family Medicine

## 2021-09-17 ENCOUNTER — Encounter: Payer: Self-pay | Admitting: Family Medicine

## 2021-09-17 VITALS — BP 122/80 | HR 79 | Temp 97.6°F | Ht 64.0 in | Wt 144.1 lb

## 2021-09-17 DIAGNOSIS — R911 Solitary pulmonary nodule: Secondary | ICD-10-CM | POA: Diagnosis not present

## 2021-09-17 DIAGNOSIS — J22 Unspecified acute lower respiratory infection: Secondary | ICD-10-CM

## 2021-09-17 DIAGNOSIS — L989 Disorder of the skin and subcutaneous tissue, unspecified: Secondary | ICD-10-CM | POA: Diagnosis not present

## 2021-09-17 DIAGNOSIS — R109 Unspecified abdominal pain: Secondary | ICD-10-CM

## 2021-09-17 DIAGNOSIS — J441 Chronic obstructive pulmonary disease with (acute) exacerbation: Secondary | ICD-10-CM | POA: Diagnosis not present

## 2021-09-17 DIAGNOSIS — Z87891 Personal history of nicotine dependence: Secondary | ICD-10-CM

## 2021-09-17 DIAGNOSIS — J439 Emphysema, unspecified: Secondary | ICD-10-CM | POA: Diagnosis not present

## 2021-09-17 DIAGNOSIS — R229 Localized swelling, mass and lump, unspecified: Secondary | ICD-10-CM | POA: Insufficient documentation

## 2021-09-17 MED ORDER — AZITHROMYCIN 250 MG PO TABS: ORAL_TABLET | ORAL | 0 refills | Status: DC

## 2021-09-17 MED ORDER — PREDNISONE 20 MG PO TABS: ORAL_TABLET | ORAL | 0 refills | Status: DC

## 2021-09-17 NOTE — Patient Instructions (Addendum)
We will refer you to skin doctor in [place].  ?Keep lung doctor appointment next month. ?For respiratory symptoms, take prednisone taper and zpack antibiotic which I have sent to your pharmacy.  ?Continue mucinex DM with water to mobilize mucous.  ?

## 2021-09-17 NOTE — Assessment & Plan Note (Signed)
This remains. Will see if we can expedite derm eval in [place].  ?

## 2021-09-17 NOTE — Assessment & Plan Note (Signed)
She did not tolerate tramadol due to unsteadiness/lightheadedness. ?Fortunately back pain has since resolved  ?

## 2021-09-17 NOTE — Assessment & Plan Note (Signed)
Sees pulm. Continue trelegy, singulair, with albuterol PRN. See below.  ?

## 2021-09-17 NOTE — Assessment & Plan Note (Signed)
Story\exam suspicious for COPD exacerbation in setting of upper respiratory infection.  ?Rx prednisone taper as well as zpack antibiotic.  ?Update if not improving with treatment.  ?

## 2021-09-17 NOTE — Assessment & Plan Note (Signed)
Reviewed recent imaging studies with patient, discussed ddx of pulmonary nodule. Will need rpt CT in 3 months.  ?She has f/u planned with pulm for next month. ?Appreciate their assistance to help follow these new pulm nodules.  ?

## 2021-09-17 NOTE — Assessment & Plan Note (Signed)
New nodule to overlying chronic skin changes to nasal bridge of unclear etiology. Will see if we can expedite derm eval in [place].  ?

## 2021-09-17 NOTE — Progress Notes (Signed)
? ? Patient ID: Stacey Durham, female    DOB: 1945-10-23, 76 y.o.   MRN: [id number] ? ?This visit was conducted in person. ? ?BP 122/80   Pulse 79   Temp 97.6 ?F (36.4 ?C) (Temporal)   Ht 5\' 4"  (1.626 m)   Wt 144 lb 2 oz (65.4 kg)   SpO2 95%   BMI 24.74 kg/m?   ? ?CC: cough, mass ?Subjective:  ? ?HPI: ?Stacey Durham is a 76 y.o. female presenting on 09/17/2021 for Mass (C/o growth on nose is increasing in size. Cannot see [facility] until 02/04/22. Pt accompanied by husband, 04/06/22. ) and Cough (C/o cough and runny nose.  Sxs started 09/14/21.  Tried OTC cold meds, not helping. ) ? ? ?3 wk h/o L mid back pain , saw Tabitha NP earlier in the month, concern for pyelo so started on cipro 500mg  BID course x7 days. UCx - no growth. Noncontrasted CT abd/pelvis without acute abdominal or pelvic findings but did show new 14mm spiculated nodule to RLL along with cluster of nodular opacities to RLL. Follow up non contrasted chest CT showed multiple solid pulmonary nodules scattered throughout both lungs, largest measuring 1.2cm in posterior LLL, all new since 11/2019, ?inflammation vs neoplastic process.  ? ?Planning to see pulm in f/u for this next month.  ? ?4d h/o congestion, rhinorrhea, cough. Increased cough and wheezing and dyspnea, both exertional and at rest, without increased sputum production. Head > chest congestion ? ?L back pain - initially treated with tramadol - didn't tolerate well due to swimmy headedness and imbalance. Back pain has since resolved.  ? ?Spot on nose present since 12/2021 after fall at home with resultant skin tear on nose. Also second poorly healing wound on left cheek just lateral to nasal bridge.  ?   ? ?Relevant past medical, surgical, family and social history reviewed and updated as indicated. Interim medical history since our last visit reviewed. ?Allergies and medications reviewed and updated. ?Outpatient Medications Prior to Visit  ?Medication Sig Dispense Refill  ?  albuterol (VENTOLIN HFA) 108 (90 Base) MCG/ACT inhaler Inhale 2 puffs into the lungs every 4 (four) hours as needed for wheezing or shortness of breath. 18 g 11  ? atorvastatin (LIPITOR) 20 MG tablet TAKE 1 TABLET BY MOUTH EVERY DAY 90 tablet 1  ? Biotin 12/2019 MCG TABS Take 10 mg by mouth daily.     ? calcium-vitamin D (OSCAL WITH D) 500-200 MG-UNIT tablet Take 1 tablet by mouth.    ? Dextromethorphan-Guaifenesin (MUCINEX DM PO) Take 1 tablet by mouth daily.    ? Fluticasone-Umeclidin-Vilant (TRELEGY ELLIPTA) 100-62.5-25 MCG/ACT AEPB Inhale 1 puff into the lungs daily. 60 each 11  ? ipratropium-albuterol (DUONEB) 0.5-2.5 (3) MG/3ML SOLN Take 3 mLs by nebulization every 4 (four) hours as needed. 360 mL 6  ? loperamide (IMODIUM A-D) 2 MG tablet Take 1 tablet (2 mg total) by mouth daily as needed for diarrhea or loose stools.  0  ? montelukast (SINGULAIR) 10 MG tablet TAKE 1 TABLET BY MOUTH EVERY DAY 90 tablet 3  ? Multiple Vitamins-Minerals (MULTIVITAMIN ADULT PO) Take 1 tablet by mouth.    ? mupirocin cream (BACTROBAN) 2 % Apply 1 application topically 2 (two) times daily. 15 g 0  ? Probiotic Product (PROBIOTIC-10 PO) Take 1 tablet by mouth.    ? TRELEGY ELLIPTA 100-62.5-25 MCG/INH AEPB INHALE 1 PUFF INTO THE LUNGS EVERY MORNING 60 each 2  ? vitamin B-12 (CYANOCOBALAMIN) 1000  MCG tablet Take 1,000 mcg by mouth daily.    ? predniSONE (DELTASONE) 20 MG tablet Take two tablets daily for 3 days followed by one tablet daily for 4 days 10 tablet 0  ? ?No facility-administered medications prior to visit.  ?  ? ?Per HPI unless specifically indicated in ROS section below ?Review of Systems ? ?Objective:  ?BP 122/80   Pulse 79   Temp 97.6 ?F (36.4 ?C) (Temporal)   Ht 5\' 4"  (1.626 m)   Wt 144 lb 2 oz (65.4 kg)   SpO2 95%   BMI 24.74 kg/m?   ?Wt Readings from Last 3 Encounters:  ?09/17/21 144 lb 2 oz (65.4 kg)  ?09/03/21 146 lb 5 oz (66.4 kg)  ?08/11/21 145 lb (65.8 kg)  ?  ?  ?Physical Exam ?Vitals and nursing note  reviewed.  ?Constitutional:   ?   Appearance: Normal appearance. She is not ill-appearing.  ?Cardiovascular:  ?   Rate and Rhythm: Normal rate and regular rhythm.  ?   Pulses: Normal pulses.  ?   Heart sounds: Normal heart sounds. No murmur heard. ?   Comments: Somewhat distant heart sounds ?Pulmonary:  ?   Breath sounds: Wheezing (diffuse ins/exp wheezing throughout) and rhonchi present. No rales.  ?   Comments: Breath sounds clear some with deep cough however dyspnea and wheezing persists ?Skin: ?   General: Skin is dry.  ?   Findings: Wound present. No rash.  ?   Comments:  ?Persistent small poorly healing wound to left medial cheek ?Chronic discoloration of skin of nasal bridge with rough skin thickening, new nodule superior to this on left nasal bridge  ?Neurological:  ?   Mental Status: She is alert.  ?Psychiatric:     ?   Mood and Affect: Mood normal.     ?   Behavior: Behavior normal.  ? ?   ? ?Assessment & Plan:  ? ?Problem List Items Addressed This Visit   ? ? Ex-smoker  ? Chronic obstructive pulmonary disease (HCC)  ?  Sees pulm. Continue trelegy, singulair, with albuterol PRN. See below.  ? ?  ?  ? Relevant Medications  ? predniSONE (DELTASONE) 20 MG tablet  ? azithromycin (ZITHROMAX) 250 MG tablet  ? COPD exacerbation (HCC) - Primary  ?  Story\exam suspicious for COPD exacerbation in setting of upper respiratory infection.  ?Rx prednisone taper as well as zpack antibiotic.  ?Update if not improving with treatment.  ? ?  ?  ? Relevant Medications  ? predniSONE (DELTASONE) 20 MG tablet  ? azithromycin (ZITHROMAX) 250 MG tablet  ? Pulmonary nodule  ?  Reviewed recent imaging studies with patient, discussed ddx of pulmonary nodule. Will need rpt CT in 3 months.  ?She has f/u planned with pulm for next month. ?Appreciate their assistance to help follow these new pulm nodules.  ? ?  ?  ? Skin lesion of cheek  ?  This remains. Will see if we can expedite derm eval in [place].  ? ?  ?  ? Relevant Orders  ?  Ambulatory referral to Dermatology  ? Left flank pain  ?  She did not tolerate tramadol due to unsteadiness/lightheadedness. ?Fortunately back pain has since resolved  ? ?  ?  ? Skin nodule  ?  New nodule to overlying chronic skin changes to nasal bridge of unclear etiology. Will see if we can expedite derm eval in [place].  ? ?  ?  ? Relevant Orders  ?  Ambulatory referral to Dermatology  ? ?Other Visit Diagnoses   ? ? Acute respiratory infection      ? Relevant Medications  ? azithromycin (ZITHROMAX) 250 MG tablet  ? ?  ?  ? ?Meds ordered this encounter  ?Medications  ? predniSONE (DELTASONE) 20 MG tablet  ?  Sig: Take two tablets daily for 4 days followed by one tablet daily for 4 days  ?  Dispense:  12 tablet  ?  Refill:  0  ? azithromycin (ZITHROMAX) 250 MG tablet  ?  Sig: Take two tablets on day one followed by one tablet on days 2-5  ?  Dispense:  6 each  ?  Refill:  0  ? ?Orders Placed This Encounter  ?Procedures  ? Ambulatory referral to Dermatology  ?  Referral Priority:   Routine  ?  Referral Type:   Consultation  ?  Referral Reason:   Specialty Services Required  ?  Requested Specialty:   Dermatology  ?  Number of Visits Requested:   1  ? ? ?Patient Instructions  ?We will refer you to skin doctor in [place].  ?Keep lung doctor appointment next month. ?For respiratory symptoms, take prednisone taper and zpack antibiotic which I have sent to your pharmacy.  ?Continue mucinex DM with water to mobilize mucous.  ? ?Follow up plan: ?No follow-ups on file. ? ?Eustaquio Boyden, MD   ?

## 2021-10-02 DIAGNOSIS — C44319 Basal cell carcinoma of skin of other parts of face: Secondary | ICD-10-CM | POA: Diagnosis not present

## 2021-10-02 DIAGNOSIS — D485 Neoplasm of uncertain behavior of skin: Secondary | ICD-10-CM | POA: Diagnosis not present

## 2021-10-02 DIAGNOSIS — L570 Actinic keratosis: Secondary | ICD-10-CM | POA: Diagnosis not present

## 2021-10-02 DIAGNOSIS — L821 Other seborrheic keratosis: Secondary | ICD-10-CM | POA: Diagnosis not present

## 2021-10-02 DIAGNOSIS — Z85828 Personal history of other malignant neoplasm of skin: Secondary | ICD-10-CM | POA: Diagnosis not present

## 2021-10-17 ENCOUNTER — Emergency Department: Payer: No Typology Code available for payment source

## 2021-10-17 ENCOUNTER — Encounter: Payer: Self-pay | Admitting: Emergency Medicine

## 2021-10-17 ENCOUNTER — Encounter: Payer: Self-pay | Admitting: Primary Care

## 2021-10-17 ENCOUNTER — Other Ambulatory Visit: Payer: Self-pay

## 2021-10-17 ENCOUNTER — Emergency Department
Admission: EM | Admit: 2021-10-17 | Discharge: 2021-10-17 | Disposition: A | Discharge status: Home / Self Care | Payer: No Typology Code available for payment source | Attending: Emergency Medicine | Admitting: Emergency Medicine

## 2021-10-17 ENCOUNTER — Ambulatory Visit (INDEPENDENT_AMBULATORY_CARE_PROVIDER_SITE_OTHER): Payer: No Typology Code available for payment source | Admitting: Primary Care

## 2021-10-17 DIAGNOSIS — R079 Chest pain, unspecified: Secondary | ICD-10-CM | POA: Diagnosis not present

## 2021-10-17 DIAGNOSIS — M25512 Pain in left shoulder: Secondary | ICD-10-CM | POA: Diagnosis not present

## 2021-10-17 DIAGNOSIS — Z20822 Contact with and (suspected) exposure to covid-19: Secondary | ICD-10-CM | POA: Diagnosis not present

## 2021-10-17 DIAGNOSIS — J9811 Atelectasis: Secondary | ICD-10-CM | POA: Diagnosis not present

## 2021-10-17 DIAGNOSIS — J441 Chronic obstructive pulmonary disease with (acute) exacerbation: Secondary | ICD-10-CM

## 2021-10-17 DIAGNOSIS — R791 Abnormal coagulation profile: Secondary | ICD-10-CM | POA: Insufficient documentation

## 2021-10-17 DIAGNOSIS — R062 Wheezing: Secondary | ICD-10-CM | POA: Diagnosis not present

## 2021-10-17 DIAGNOSIS — R911 Solitary pulmonary nodule: Secondary | ICD-10-CM | POA: Insufficient documentation

## 2021-10-17 DIAGNOSIS — R0602 Shortness of breath: Secondary | ICD-10-CM | POA: Diagnosis not present

## 2021-10-17 LAB — RESP PANEL BY RT-PCR (FLU A&B, COVID) ARPGX2
Influenza A by PCR: NEGATIVE
Influenza B by PCR: NEGATIVE
SARS Coronavirus 2 by RT PCR: NEGATIVE

## 2021-10-17 LAB — BASIC METABOLIC PANEL
Anion gap: 8 (ref 5–15)
BUN: 19 mg/dL (ref 8–23)
CO2: 24 mmol/L (ref 22–32)
Calcium: 8.8 mg/dL — ABNORMAL LOW (ref 8.9–10.3)
Chloride: 105 mmol/L (ref 98–111)
Creatinine, Ser: 0.89 mg/dL (ref 0.44–1.00)
GFR, Estimated: >60 mL/min (ref >60)
Glucose, Bld: 130 mg/dL — ABNORMAL HIGH (ref 70–99)
Potassium: 3.9 mmol/L (ref 3.5–5.1)
Sodium: 137 mmol/L (ref 135–145)

## 2021-10-17 LAB — CBC
HCT: 41.5 % (ref 36.0–46.0)
Hemoglobin: 13.5 g/dL (ref 12.0–15.0)
MCH: 29.8 pg (ref 26.0–34.0)
MCHC: 32.5 g/dL (ref 30.0–36.0)
MCV: 91.6 fL (ref 80.0–100.0)
Platelets: 269 10*3/uL (ref 150–400)
RBC: 4.53 MIL/uL (ref 3.87–5.11)
RDW: 12.5 % (ref 11.5–15.5)
WBC: 5.7 10*3/uL (ref 4.0–10.5)
nRBC: 0 % (ref 0.0–0.2)

## 2021-10-17 LAB — TROPONIN I (HIGH SENSITIVITY)
Troponin I (High Sensitivity): <2 ng/L (ref <18)
Troponin I (High Sensitivity): 3 ng/L (ref <18)

## 2021-10-17 LAB — D-DIMER, QUANTITATIVE: D-Dimer, Quant: 0.56 ug/mL-FEU — ABNORMAL HIGH (ref 0.00–0.50)

## 2021-10-17 MED ORDER — METHYLPREDNISOLONE SODIUM SUCC 125 MG IJ SOLR
125.0000 mg | Freq: Once | INTRAMUSCULAR | Status: AC
  Administered 2021-10-17: 125 mg via INTRAVENOUS
  Filled 2021-10-17: qty 2

## 2021-10-17 MED ORDER — LIDOCAINE 5 % EX PTCH: 1.0000 | MEDICATED_PATCH | Freq: Two times a day (BID) | CUTANEOUS | 0 refills | Status: AC

## 2021-10-17 MED ORDER — PREDNISONE 20 MG PO TABS: 40.0000 mg | ORAL_TABLET | Freq: Every day | ORAL | 0 refills | Status: AC

## 2021-10-17 MED ORDER — IOHEXOL 350 MG/ML SOLN
75.0000 mL | Freq: Once | INTRAVENOUS | Status: AC | PRN
  Administered 2021-10-17: 75 mL via INTRAVENOUS

## 2021-10-17 MED ORDER — IPRATROPIUM-ALBUTEROL 0.5-2.5 (3) MG/3ML IN SOLN
3.0000 mL | Freq: Once | RESPIRATORY_TRACT | Status: AC
Start: 2021-10-17 — End: 2021-10-17
  Administered 2021-10-17: 3 mL via RESPIRATORY_TRACT
  Filled 2021-10-17: qty 3

## 2021-10-17 MED ORDER — ACETAMINOPHEN 500 MG PO TABS
1000.0000 mg | ORAL_TABLET | Freq: Once | ORAL | Status: AC
  Administered 2021-10-17: 1000 mg via ORAL
  Filled 2021-10-17: qty 2

## 2021-10-17 MED ORDER — LIDOCAINE 5 % EX PTCH
1.0000 | MEDICATED_PATCH | Freq: Once | CUTANEOUS | Status: DC
  Administered 2021-10-17: 1 via TRANSDERMAL
  Filled 2021-10-17: qty 1

## 2021-10-17 MED ORDER — DOXYCYCLINE MONOHYDRATE 100 MG PO TABS: 100.0000 mg | ORAL_TABLET | Freq: Two times a day (BID) | ORAL | 0 refills | Status: AC

## 2021-10-17 NOTE — ED Notes (Signed)
Pt ambulated 1 lap around unit lowest O2 reading 89%.

## 2021-10-17 NOTE — Assessment & Plan Note (Signed)
-   Acute exacerbation. Patient is visibly short of breath while speaking with associated wheezing. She has new left sided shoulder/back pain and is very unsteady on her feet. Anxiety might be playing a role in her dyspnea. Husband is also concern about her pain. Recommending ED evaluation. Will likely need CXR, trop/d-dimer, bronchodilator treatment and IV steroids.

## 2021-10-17 NOTE — Discharge Instructions (Addendum)
Take Tylenol 1 g every 8 hours with the lidocaine patches to help with pain and take the steroids and antibiotics to help with your COPD exacerbation.  We discussed admission given borderline low oxygen levels but you have elected to want to go home.  If you develop worsening shortness of breath or any other concerns please return to the ER for repeat evaluation  Follow-up in MyChart for your COVID test  1. No filling defect is identified in the pulmonary arterial tree to  suggest pulmonary embolus.  2. Mild chronic right hilar and borderline mediastinal adenopathy  with waxing and waning small pulmonary nodules over the last several  exams. This includes resolution of most of the right lower lobe  pulmonary nodularity shown on the prior exam but also a new 5 mm in  diameter right middle lobe pulmonary nodule compared to last month.  This waxing and waning appearance favors atypical infectious  bronchiolitis. Given the underlying emphysema, follow up noncontrast  chest CT in 12 months time might be considered.  3. Airway thickening is present, suggesting bronchitis or reactive  airways disease. Airway plugging in both lower lobes.  4. Aortic Atherosclerosis (ICD10-I70.0) and Emphysema (ICD10-J43.9).

## 2021-10-17 NOTE — ED Notes (Signed)
See triage note. Pt in with L should blade pain; denies pain or numbness in chest, jaw or either arm. Pt laying calmly on stretcher; skin dry; pt's resp rate increased/abnormal due to pain. Visitor at bedside.

## 2021-10-17 NOTE — ED Provider Notes (Signed)
[facility] Provider Note    Event Date/Time   First MD Initiated Contact with Patient 10/17/21 1222     (approximate)   History   Shortness of Breath   HPI  Stacey Durham is a 76 y.o. female with COPD who comes in with shortness of breath.  Patient reports having 4 days of left shoulder pain.  She reports that she has a little bit of increasing shortness of breath associated with it.  She denies ever having this pain though before with the COPD exacerbation.  Patient was seen by her pulmonologist who thought she was having a COPD flare I recommended going to the ER to get D-dimer to rule out blood clot.  Patient denies any swelling in her legs.  Denies any significant chest pain.      Physical Exam   Triage Vital Signs: ED Triage Vitals  Enc Vitals Group     BP 10/17/21 0938 (!) 154/78     Pulse Rate 10/17/21 0938 71     Resp 10/17/21 0938 (!) 26     Temp 10/17/21 0938 98.5 F (36.9 C)     Temp Source 10/17/21 0938 Oral     SpO2 10/17/21 0938 98 %     Weight 10/17/21 0939 144 lb (65.3 kg)     Height 10/17/21 0939 5\' 4"  (1.626 m)     Head Circumference --      Peak Flow --      Pain Score 10/17/21 0939 8     Pain Loc --      Pain Edu? --      Excl. in GC? --     Most recent vital signs: Vitals:   10/17/21 0938  BP: (!) 154/78  Pulse: 71  Resp: (!) 26  Temp: 98.5 F (36.9 C)  SpO2: 98%     General: Awake, no distress.  CV:  Good peripheral perfusion.  Resp:  Normal effort.  Some mild wheezing Abd:  No distention.  Other:  No swelling of the legs   ED Results / Procedures / Treatments   Labs (all labs ordered are listed, but only abnormal results are displayed) Labs Reviewed  BASIC METABOLIC PANEL - Abnormal; Notable for the following components:      Result Value   Glucose, Bld 130 (*)    Calcium 8.8 (*)    All other components within normal limits  CBC  TROPONIN I (HIGH SENSITIVITY)  TROPONIN I (HIGH SENSITIVITY)      EKG  My interpretation of EKG:  Normal sinus rate of 72 without any ST elevation or T wave inversions except for aVL, normal intervals  RADIOLOGY I have reviewed the xray personally interpreted and there is a little bit of streakiness in the right mid lung  IMPRESSION: 1. Mild streaky opacity in the right mid lung may represent atelectasis. Recommend attention on follow-up. 2. Atelectasis in the bases, left greater than right, subsegmental. 3. Emphysematous changes in the lungs. 4. No other acute abnormalities.   PROCEDURES:  Critical Care performed: No  Procedures   MEDICATIONS ORDERED IN ED: Medications  lidocaine (LIDODERM) 5 % 1 patch (1 patch Transdermal Patch Applied 10/17/21 1255)  ipratropium-albuterol (DUONEB) 0.5-2.5 (3) MG/3ML nebulizer solution 3 mL (3 mLs Nebulization Given 10/17/21 1257)  methylPREDNISolone sodium succinate (SOLU-MEDROL) 125 mg/2 mL injection 125 mg (125 mg Intravenous Given 10/17/21 1252)  acetaminophen (TYLENOL) tablet 1,000 mg (1,000 mg Oral Given 10/17/21 1252)     IMPRESSION /  MDM / ASSESSMENT AND PLAN / ED COURSE  I reviewed the triage vital signs and the nursing notes.   Patient comes in with concern for acute life-threatening pathology such as PE, ACS, COPD exacerbation.  We will give some DuoNebs, steroids and proceed with D-dimer to further evaluate.  D-dimer positive.  Troponin negative x2.  CBC reassuring BMP reassuring  1. No filling defect is identified in the pulmonary arterial tree to suggest pulmonary embolus. 2. Mild chronic right hilar and borderline mediastinal adenopathy with waxing and waning small pulmonary nodules over the last several exams. This includes resolution of most of the right lower lobe pulmonary nodularity shown on the prior exam but also a new 5 mm in diameter right middle lobe pulmonary nodule compared to last month. This waxing and waning appearance favors atypical infectious bronchiolitis.  Given the underlying emphysema, follow up noncontrast chest CT in 12 months time might be considered. 3. Airway thickening is present, suggesting bronchitis or reactive airways disease. Airway plugging in both lower lobes. 4. Aortic Atherosclerosis (ICD10-I70.0) and Emphysema (ICD10-J43.9).      Discussed with patient the incidental findings on CT she expressed understanding.  Oxygen level was 92% on room air with ambulation goes down to 89%.  We discussed admission due to borderline oxygen levels but patient is adamant that she would really like to go home.  She reports that her pain is relieved with the lidocaine patches and that she strongly prefers discharge.  She understands that she could worsen and require admission and she can always return to the ER and she expressed understanding.  Her husband is at bedside who witnesses this conversation but she is adamant that she would like to go home     FINAL CLINICAL IMPRESSION(S) / ED DIAGNOSES   Final diagnoses:  COPD exacerbation (HCC)     Rx / DC Orders   ED Discharge Orders          Ordered    predniSONE (DELTASONE) 20 MG tablet  Daily with breakfast        10/17/21 1614    doxycycline (ADOXA) 100 MG tablet  2 times daily        10/17/21 1614    lidocaine (LIDODERM) 5 %  Every 12 hours        10/17/21 1614             Note:  This document was prepared using Dragon voice recognition software and may include unintentional dictation errors.   Concha Se, MD 10/17/21 [phone]

## 2021-10-17 NOTE — ED Triage Notes (Signed)
Pt via POV from home. Pt c/o L scapula pain that started 3-4 days ago. Pt c/o SOB but states that it is normal for her and it has not gotten worse. Pt was sent over by her pulmonologist. Pt has a hx of COPD. Pt is A&OX4, pt O2 WNL but tachypneic on arrival.

## 2021-10-17 NOTE — Progress Notes (Signed)
@Patient  ID: Stacey Durham, female    DOB: May 03, 1946, 76 y.o.   MRN: [id number]  Chief Complaint  Patient presents with   Follow-up    CT 09/05/2021--sob with exertion and dry cough.     Referring provider: Ria Bush, MD  HPI: 76 year old female, former smoker.  Past medical history significant for severe COPD GOLD D, Covid-19 pneumonia November 2020.  Patient of Dr. Mortimer Fries.  Maintained on Trelegy 199mcg 1 puff daily and as needed albuterol.  Patient was given prescription for Inogen.  Ordered CT chest to assess for ILD/fibrosis.  10/17/2021 Complains of acute pain to left shoulder/back. Pain started 3 days ago. She is very visibility short of breath and wheezing while speaking. She is using Trelegy 135mcg. She is also newly unsteady on her feet. CT chest in  in July showed multiple solid pulmonary nodules scattered through both lungs, largest 1.2cm in the posterior left lower lobe, all new since 12/20/19. This was reviewed by Dr. Patsey Berthold, she needs follow-up CT scan in July which has already been.    Allergies  Allergen Reactions   Tramadol Other (See Comments)    Lightheaded, imbalance    Immunization History  Administered Date(s) Administered   Fluad Quad(high Dose 65+) 02/03/2019   Influenza Split 01/30/2015   Influenza, High Dose Seasonal PF 02/07/2018, 02/23/2020, 03/14/2021   Influenza,inj,Quad PF,6+ Mos 01/31/2016, 06/24/2017   Influenza-Unspecified 06/05/2015   PFIZER(Purple Top)SARS-COV-2 Vaccination 07/16/2019, 08/09/2019, 02/09/2020   Pneumococcal Conjugate-13 06/05/2015   Pneumococcal Polysaccharide-23 03/28/2012   Tdap 06/23/2012    Past Medical History:  Diagnosis Date   Acute cholecystitis    Asthma    Cholecystitis with cholelithiasis 10/11/2015   COPD (chronic obstructive pulmonary disease) ([place])    Ex-smoker    quit 2000s   Pneumonia 11/26/2015    Tobacco History: Social History   Tobacco Use  Smoking Status Former   Packs/day: 1.25    Years: 40.00   Pack years: 50.00   Types: Cigarettes   Start date: 1964   Quit date: 2003   Years since quitting: 20.3  Smokeless Tobacco Never   Counseling given: Not Answered   Outpatient Medications Prior to Visit  Medication Sig Dispense Refill   albuterol (VENTOLIN HFA) 108 (90 Base) MCG/ACT inhaler Inhale 2 puffs into the lungs every 4 (four) hours as needed for wheezing or shortness of breath. 18 g 11   atorvastatin (LIPITOR) 20 MG tablet TAKE 1 TABLET BY MOUTH EVERY DAY 90 tablet 1   Biotin 10000 MCG TABS Take 10 mg by mouth daily.      calcium-vitamin D (OSCAL WITH D) 500-200 MG-UNIT tablet Take 1 tablet by mouth.     Dextromethorphan-Guaifenesin (MUCINEX DM PO) Take 1 tablet by mouth daily.     Fluticasone-Umeclidin-Vilant (TRELEGY ELLIPTA) 100-62.5-25 MCG/ACT AEPB Inhale 1 puff into the lungs daily. 60 each 11   ipratropium-albuterol (DUONEB) 0.5-2.5 (3) MG/3ML SOLN Take 3 mLs by nebulization every 4 (four) hours as needed. 360 mL 6   loperamide (IMODIUM A-D) 2 MG tablet Take 1 tablet (2 mg total) by mouth daily as needed for diarrhea or loose stools.  0   montelukast (SINGULAIR) 10 MG tablet TAKE 1 TABLET BY MOUTH EVERY DAY 90 tablet 3   Multiple Vitamins-Minerals (MULTIVITAMIN ADULT PO) Take 1 tablet by mouth.     mupirocin cream (BACTROBAN) 2 % Apply 1 application topically 2 (two) times daily. 15 g 0   Probiotic Product (PROBIOTIC-10 PO) Take 1 tablet by mouth.  TRELEGY ELLIPTA 100-62.5-25 MCG/INH AEPB INHALE 1 PUFF INTO THE LUNGS EVERY MORNING 60 each 2   vitamin B-12 (CYANOCOBALAMIN) 1000 MCG tablet Take 1,000 mcg by mouth daily.     azithromycin (ZITHROMAX) 250 MG tablet Take two tablets on day one followed by one tablet on days 2-5 6 each 0   predniSONE (DELTASONE) 20 MG tablet Take two tablets daily for 4 days followed by one tablet daily for 4 days 12 tablet 0   No facility-administered medications prior to visit.   Review of Systems  Review of Systems   Constitutional: Negative.   HENT: Negative.    Respiratory:  Positive for shortness of breath and wheezing. Negative for cough.   Musculoskeletal:  Positive for back pain.       Left shoulder pain  Psychiatric/Behavioral:  The patient is nervous/anxious.     Physical Exam  BP 122/78 (BP Location: Left Arm, Cuff Size: Normal)   Pulse 71   Temp 98 F (36.7 C) (Temporal)   Ht 5\' 4"  (1.626 m)   Wt 144 lb (65.3 kg)   SpO2 98%   BMI 24.72 kg/m  Physical Exam Constitutional:      Appearance: Normal appearance.  HENT:     Head: Normocephalic and atraumatic.  Cardiovascular:     Rate and Rhythm: Normal rate and regular rhythm.  Pulmonary:     Breath sounds: Wheezing present.     Comments: Labored breathing  Musculoskeletal:     Comments: Unsteady gait, requiring transport WC  Neurological:     Mental Status: She is alert.  Psychiatric:     Comments: Anxious      Lab Results:  CBC    Component Value Date/Time   WBC 5.1 09/03/2021 0955   RBC 4.46 09/03/2021 0955   HGB 13.8 09/03/2021 0955   HCT 41.0 09/03/2021 0955   PLT 230.0 09/03/2021 0955   MCV 92.0 09/03/2021 0955   MCH 30.5 12/10/2019 1149   MCHC 33.7 09/03/2021 0955   RDW 13.0 09/03/2021 0955   LYMPHSABS 1.5 09/03/2021 0955   MONOABS 0.6 09/03/2021 0955   EOSABS 0.2 09/03/2021 0955   BASOSABS 0.0 09/03/2021 0955    BMET    Component Value Date/Time   NA 139 09/03/2021 0955   K 4.0 09/03/2021 0955   CL 105 09/03/2021 0955   CO2 26 09/03/2021 0955   GLUCOSE 99 09/03/2021 0955   BUN 22 09/03/2021 0955   CREATININE 0.96 09/03/2021 0955   CALCIUM 9.4 09/03/2021 0955   GFRNONAA 53 (L) 12/10/2019 1149   GFRAA >60 12/10/2019 1149    BNP    Component Value Date/Time   BNP 30.6 12/10/2019 1149    ProBNP No results found for: PROBNP  Imaging: No results found.   Assessment & Plan:   COPD exacerbation ([place]) - Acute exacerbation. Patient is visibly short of breath while speaking with associated  wheezing. She has new left sided shoulder/back pain and is very unsteady on her feet. Anxiety might be playing a role in her dyspnea. Husband is also concern about her pain. Recommending ED evaluation. Will likely need CXR, trop/d-dimer, bronchodilator treatment and IV steroids.      Martyn Ehrich, NP 10/17/2021

## 2021-10-20 ENCOUNTER — Other Ambulatory Visit: Payer: Self-pay | Admitting: Family Medicine

## 2021-10-20 DIAGNOSIS — Z1231 Encounter for screening mammogram for malignant neoplasm of breast: Secondary | ICD-10-CM

## 2021-10-26 ENCOUNTER — Other Ambulatory Visit: Payer: Self-pay | Admitting: Family Medicine

## 2021-11-04 ENCOUNTER — Encounter: Payer: Self-pay | Admitting: Pulmonary Disease

## 2021-11-04 ENCOUNTER — Ambulatory Visit (INDEPENDENT_AMBULATORY_CARE_PROVIDER_SITE_OTHER): Payer: No Typology Code available for payment source | Admitting: Pulmonary Disease

## 2021-11-04 VITALS — BP 126/80 | HR 89 | Temp 97.8°F | Ht 64.0 in | Wt 147.0 lb

## 2021-11-04 DIAGNOSIS — J449 Chronic obstructive pulmonary disease, unspecified: Secondary | ICD-10-CM

## 2021-11-04 DIAGNOSIS — I5189 Other ill-defined heart diseases: Secondary | ICD-10-CM

## 2021-11-04 DIAGNOSIS — R0609 Other forms of dyspnea: Secondary | ICD-10-CM

## 2021-11-04 DIAGNOSIS — R918 Other nonspecific abnormal finding of lung field: Secondary | ICD-10-CM | POA: Diagnosis not present

## 2021-11-04 NOTE — Progress Notes (Signed)
Subjective:    Patient ID: Stacey Durham, female    DOB: 1945/07/08, 76 y.o.   MRN: [id number] Patient Care Team: Eustaquio Boyden, MD as PCP - General (Family Medicine) Willis Modena, MD as Consulting Physician (Gastroenterology) Carrie Mew, OD as Consulting Physician [facility])  Chief Complaint  Patient presents with  . Follow-up   HPI Patient is a 76 year old former smoker (quit 2003, 50 PY) who usually follows with Dr. [name] presents for follow-up on the issue of COPD and lung nodules today.  She last saw Dr. Belia Heman on 24 March 2021 and most recently Ames Dura, NP on 17 Oct 2021.  At the time of her last visit she was noted to be in respiratory distress and was referred to the emergency room.  Noted to desaturate to 89% with ambulation in the emergency room but the patient declined admission at that time treated with prednisone and doxycycline and discharged home.  Of note the patient had had a CT chest on 05 September 2021 that showed some nodules particularly on the left base that were of concern.  However the CT angio performed on 19 May in the emergency room showed almost complete resolution of these nodules.  These were likely inflammatory/infectious in nature.  Today the patient presents as a follow-up from that ER visit.  She feels like she is doing well and is back to baseline.  She does note dyspnea on exertion but she has had this for a number of years.  Review of the data available shows that she had an echocardiogram in 2021 that showed she had an LVEF of 55 to 60% and grade 3 diastolic dysfunction (restrictive).  I suspect that her dyspnea is multifactorial.  He FTs performed in 2017 show that she had an FEV1 of 1.10 L or 46% predicted, FVC of 2.10 L or 67% predicted, FEV1/FVC of 52% predicted, no significant bronchodilator response.  She had normal lung volumes and mildly decreased diffusion capacity.  Review of Systems A 10 point review of systems was  performed and it is as noted above otherwise negative.  Past Medical History:  Diagnosis Date  . Acute cholecystitis   . Asthma   . Cholecystitis with cholelithiasis 10/11/2015  . COPD (chronic obstructive pulmonary disease) (HCC)   . Ex-smoker    quit 2000s  . Pneumonia 11/26/2015   Past Surgical History:  Procedure Laterality Date  . ABDOMINAL HYSTERECTOMY  1978   part of an ovary remained  . APPENDECTOMY  1978  . CATARACT EXTRACTION, BILATERAL  2013  . CHOLECYSTECTOMY N/A 10/11/2015   Procedure: LAPAROSCOPIC CHOLECYSTECTOMY;  Surgeon: Leafy Ro, MD;  Location: [facility] ORS;  Service: General;  Laterality: N/A;  . COLONOSCOPY  07/2016   HP, ext hem, diverticulosis, rpt 5 yrs ([place])  . TONSILLECTOMY     Patient Active Problem List   Diagnosis Date Noted  . Skin nodule 09/17/2021  . Left flank pain 09/07/2021  . Urinary frequency 09/07/2021  . Skin lesion of cheek 08/12/2021  . Hematoma of arm, left, sequela 04/01/2021  . Medicare annual wellness visit, subsequent 08/07/2020  . Urge incontinence of urine 08/07/2020  . Grade III diastolic dysfunction 12/16/2019  . CKD (chronic kidney disease) stage 3, GFR 30-59 ml/min (HCC) 07/28/2018  . Chronic diarrhea 05/10/2018  . Chronic respiratory failure with hypoxia (HCC) 01/29/2018  . Osteopenia 09/25/2017  . Pulmonary nodule 02/02/2017  . Neck pain 01/30/2017  . Health maintenance examination 06/26/2016  . Advanced care planning/counseling  discussion 06/26/2016  . Incontinence of feces with fecal urgency 06/26/2016  . HLD (hyperlipidemia) 06/26/2016  . Prediabetes 06/26/2016  . Hair loss 01/07/2016  . Subclinical hypothyroidism 01/07/2016  . Aortic atherosclerosis (HCC) 01/07/2016  . CAD (coronary artery disease) 01/07/2016  . COPD exacerbation (HCC) 11/26/2015  . Ex-smoker 09/25/2015  . Chronic obstructive pulmonary disease (HCC) 08/16/2015   Family History  Problem Relation Age of Onset  . Heart attack Father [id number]  .  Diabetes Father   . Diabetes Mother   . Stroke Neg Hx   . Cancer Neg Hx    Social History   Tobacco Use  . Smoking status: Former    Packs/day: 1.25    Years: 40.00    Pack years: 50.00    Types: Cigarettes    Start date: 1964    Quit date: 2003    Years since quitting: 20.4  . Smokeless tobacco: Never  Substance Use Topics  . Alcohol use: No   Allergies  Allergen Reactions  . Tramadol Other (See Comments)    Lightheaded, imbalance   Current Meds  Medication Sig  . albuterol (VENTOLIN HFA) 108 (90 Base) MCG/ACT inhaler Inhale 2 puffs into the lungs every 4 (four) hours as needed for wheezing or shortness of breath.  Marland Kitchen. atorvastatin (LIPITOR) 20 MG tablet TAKE 1 TABLET BY MOUTH EVERY DAY  . Biotin [id number] MCG TABS Take 10 mg by mouth daily.   . calcium-vitamin D (OSCAL WITH D) 500-200 MG-UNIT tablet Take 1 tablet by mouth.  . Dextromethorphan-Guaifenesin (MUCINEX DM PO) Take 1 tablet by mouth daily.  . Fluticasone-Umeclidin-Vilant (TRELEGY ELLIPTA) 100-62.5-25 MCG/ACT AEPB Inhale 1 puff into the lungs daily.  Marland Kitchen. ipratropium-albuterol (DUONEB) 0.5-2.5 (3) MG/3ML SOLN Take 3 mLs by nebulization every 4 (four) hours as needed.  . loperamide (IMODIUM A-D) 2 MG tablet Take 1 tablet (2 mg total) by mouth daily as needed for diarrhea or loose stools.  . montelukast (SINGULAIR) 10 MG tablet TAKE 1 TABLET BY MOUTH EVERY DAY  . Multiple Vitamins-Minerals (MULTIVITAMIN ADULT PO) Take 1 tablet by mouth.  . mupirocin cream (BACTROBAN) 2 % Apply 1 application topically 2 (two) times daily.  . Probiotic Product (PROBIOTIC-10 PO) Take 1 tablet by mouth.  . vitamin B-12 (CYANOCOBALAMIN) 1000 MCG tablet Take 1,000 mcg by mouth daily.  . [DISCONTINUED] TRELEGY ELLIPTA 100-62.5-25 MCG/INH AEPB INHALE 1 PUFF INTO THE LUNGS EVERY MORNING   Immunization History  Administered Date(s) Administered  . Fluad Quad(high Dose 65+) 02/03/2019  . Influenza Split 01/30/2015  . Influenza, High Dose Seasonal  PF 02/07/2018, 02/23/2020, 03/14/2021  . Influenza,inj,Quad PF,6+ Mos 01/31/2016, 06/24/2017  . Influenza-Unspecified 06/05/2015  . PFIZER(Purple Top)SARS-COV-2 Vaccination 07/16/2019, 08/09/2019, 02/09/2020  . Pneumococcal Conjugate-13 06/05/2015  . Pneumococcal Polysaccharide-23 03/28/2012  . Tdap 06/23/2012       Objective:   Physical Exam BP 126/80 (BP Location: Left Arm, Cuff Size: Normal)   Pulse 89   Temp 97.8 F (36.6 C) (Temporal)   Ht 5\' 4"  (1.626 m)   Wt 147 lb (66.7 kg)   SpO2 93%   BMI 25.23 kg/m  GENERAL: Well-developed, well-nourished woman, no acute distress.  Fully ambulatory, no conversational dyspnea. HEAD: Normocephalic, atraumatic.  EYES: Pupils equal, round, reactive to light.  No scleral icterus.  MOUTH: Oral mucosa moist. NECK: Supple. No thyromegaly. Trachea midline. No JVD.  No adenopathy. PULMONARY: Good air entry bilaterally.  Coarse otherwise, no adventitious sounds. CARDIOVASCULAR: S1 and S2. Regular rate and rhythm.  No rubs, murmurs  or gallops heard. ABDOMEN: Benign. MUSCULOSKELETAL: No joint deformity, no clubbing, no edema.  NEUROLOGIC: No overt focal deficit, no gait disturbance, speech is fluent. SKIN: Intact,warm,dry. PSYCH: Mood and behavior normal  Ambulatory oximetry was performed today: At rest heart rate was 91 bpm at maximum exercise rate 109 bpm.  Resting O2 sats on room air 99% O2 sat nadir 95%.  Patient completed 3 laps with only mild shortness of breath.   Representative images side-by-side of CT chest performed 05 September 2021 and 17 Oct 2021 showing almost complete resolution of the previously noted nodules on the April film:      Assessment & Plan:     ICD-10-CM   1. COPD, severity to be determined (HCC)  J44.9    Reassess with PFTs Continue Trelegy and as needed albuterol    2. Multiple lung nodules  R91.8    Recheck CT noncontrast 6 months Appears to be secondary to infection/inflammation    3. Grade III diastolic  dysfunction  I51.89    Recheck 2D echo May be adding to patient's issues with dyspnea    4. Dyspnea on exertion  R06.09    Multifactorial: COPD/diastolic dysfunction/deconditioning     Orders Placed This Encounter  Procedures  . CT CHEST WO CONTRAST    Standing Status:   Future    Standing Expiration Date:   11/05/2022    Scheduling Instructions:     61mo    Order Specific Question:   Preferred imaging location?    Answer:   [facility]  . Pulmonary Function Test [facility] Only    Standing Status:   Future    Standing Expiration Date:   11/05/2022    Scheduling Instructions:     Next available.    Order Specific Question:   Full PFT: includes the following: basic spirometry, spirometry pre & post bronchodilator, diffusion capacity (DLCO), lung volumes    Answer:   Full PFT  . ECHOCARDIOGRAM COMPLETE    Standing Status:   Future    Standing Expiration Date:   05/06/2022    Order Specific Question:   Where should this test be performed    Answer:   [facility]    Order Specific Question:   Perflutren DEFINITY (image enhancing agent) should be administered unless hypersensitivity or allergy exist    Answer:   Administer Perflutren    Order Specific Question:   Reason for exam-Echo    Answer:   Dyspnea  R06.00

## 2021-11-04 NOTE — Patient Instructions (Signed)
We are getting some breathing tests, and echocardiogram which is a heart test this will tell us more about your issues with shortness of breath.  Continue your Trelegy as you are doing.  Continue using albuterol as needed.  Your CT scan will be repeated in 6 months.  I suspect the nodules that were being seen were due to inflammation/infection and these are improving.  We will see you in follow-up in 3 months time call sooner should any new problems arise.

## 2021-11-05 ENCOUNTER — Encounter: Payer: Self-pay | Admitting: Pulmonary Disease

## 2021-11-10 DIAGNOSIS — C44311 Basal cell carcinoma of skin of nose: Secondary | ICD-10-CM | POA: Diagnosis not present

## 2021-11-10 DIAGNOSIS — Z85828 Personal history of other malignant neoplasm of skin: Secondary | ICD-10-CM | POA: Diagnosis not present

## 2021-11-12 ENCOUNTER — Telehealth: Payer: Self-pay | Admitting: Internal Medicine

## 2021-11-12 NOTE — Telephone Encounter (Signed)
Received surgical clearance form from [facility] for colonoscopy for hx of colon polyps. Patient last seen 11/04/2021. Form has been placed in Dr. Georgann Housekeeper folder.  Routing as FYI.

## 2021-11-13 NOTE — Telephone Encounter (Signed)
Clearance form and last OV note has been faxed to [facility].  Received successful fax confirmation.  Nothing further needed.

## 2021-11-13 NOTE — Telephone Encounter (Signed)
Form completed.  Send most recent note as well.

## 2021-11-18 ENCOUNTER — Ambulatory Visit
Admission: RE | Admit: 2021-11-18 | Discharge: 2021-11-18 | Disposition: A | Discharge status: Home / Self Care | Payer: No Typology Code available for payment source | Source: Ambulatory Visit | Attending: Family Medicine | Admitting: Family Medicine

## 2021-11-18 DIAGNOSIS — Z1231 Encounter for screening mammogram for malignant neoplasm of breast: Secondary | ICD-10-CM | POA: Diagnosis not present

## 2021-11-19 DIAGNOSIS — H524 Presbyopia: Secondary | ICD-10-CM | POA: Diagnosis not present

## 2021-11-20 ENCOUNTER — Other Ambulatory Visit: Payer: Self-pay | Admitting: Family Medicine

## 2021-11-20 DIAGNOSIS — R928 Other abnormal and inconclusive findings on diagnostic imaging of breast: Secondary | ICD-10-CM

## 2021-11-26 ENCOUNTER — Other Ambulatory Visit: Payer: Self-pay | Admitting: Family Medicine

## 2021-11-26 ENCOUNTER — Ambulatory Visit
Admission: RE | Admit: 2021-11-26 | Discharge: 2021-11-26 | Disposition: A | Discharge status: Home / Self Care | Payer: No Typology Code available for payment source | Source: Ambulatory Visit | Attending: Family Medicine | Admitting: Family Medicine

## 2021-11-26 DIAGNOSIS — R921 Mammographic calcification found on diagnostic imaging of breast: Secondary | ICD-10-CM

## 2021-11-26 DIAGNOSIS — R928 Other abnormal and inconclusive findings on diagnostic imaging of breast: Secondary | ICD-10-CM

## 2021-12-03 ENCOUNTER — Ambulatory Visit (INDEPENDENT_AMBULATORY_CARE_PROVIDER_SITE_OTHER): Payer: No Typology Code available for payment source

## 2021-12-03 DIAGNOSIS — I5189 Other ill-defined heart diseases: Secondary | ICD-10-CM | POA: Diagnosis not present

## 2021-12-03 LAB — ECHOCARDIOGRAM COMPLETE
AR max vel: 2.81 cm2
AV Area VTI: 3.1 cm2
AV Area mean vel: 2.68 cm2
AV Mean grad: 3 mmHg
AV Peak grad: 5.3 mmHg
Ao pk vel: 1.15 m/s
Area-P 1/2: 3.77 cm2
Calc EF: 54.8 %
S' Lateral: 3.7 cm
Single Plane A2C EF: 53.4 %
Single Plane A4C EF: 53 %

## 2021-12-07 ENCOUNTER — Other Ambulatory Visit: Payer: Self-pay | Admitting: Internal Medicine

## 2021-12-30 HISTORY — PX: COLONOSCOPY: SHX174

## 2022-01-04 ENCOUNTER — Other Ambulatory Visit: Payer: Self-pay | Admitting: Family Medicine

## 2022-01-12 NOTE — Telephone Encounter (Signed)
error 

## 2022-01-28 DIAGNOSIS — D120 Benign neoplasm of cecum: Secondary | ICD-10-CM | POA: Diagnosis not present

## 2022-01-28 DIAGNOSIS — D122 Benign neoplasm of ascending colon: Secondary | ICD-10-CM | POA: Diagnosis not present

## 2022-01-28 DIAGNOSIS — Z09 Encounter for follow-up examination after completed treatment for conditions other than malignant neoplasm: Secondary | ICD-10-CM | POA: Diagnosis not present

## 2022-01-28 DIAGNOSIS — Z86010 Personal history of colonic polyps: Secondary | ICD-10-CM | POA: Diagnosis not present

## 2022-01-28 DIAGNOSIS — K648 Other hemorrhoids: Secondary | ICD-10-CM | POA: Diagnosis not present

## 2022-01-28 DIAGNOSIS — K5730 Diverticulosis of large intestine without perforation or abscess without bleeding: Secondary | ICD-10-CM | POA: Diagnosis not present

## 2022-01-28 LAB — HM COLONOSCOPY

## 2022-01-30 DIAGNOSIS — D122 Benign neoplasm of ascending colon: Secondary | ICD-10-CM | POA: Diagnosis not present

## 2022-02-04 ENCOUNTER — Ambulatory Visit: Payer: No Typology Code available for payment source | Admitting: Dermatology

## 2022-02-05 ENCOUNTER — Ambulatory Visit (INDEPENDENT_AMBULATORY_CARE_PROVIDER_SITE_OTHER): Payer: No Typology Code available for payment source | Admitting: Pulmonary Disease

## 2022-02-05 ENCOUNTER — Encounter: Payer: Self-pay | Admitting: Pulmonary Disease

## 2022-02-05 VITALS — BP 124/78 | HR 85 | Temp 97.6°F | Ht 64.0 in | Wt 144.6 lb

## 2022-02-05 DIAGNOSIS — Z87891 Personal history of nicotine dependence: Secondary | ICD-10-CM | POA: Diagnosis not present

## 2022-02-05 DIAGNOSIS — J449 Chronic obstructive pulmonary disease, unspecified: Secondary | ICD-10-CM | POA: Diagnosis not present

## 2022-02-05 DIAGNOSIS — R918 Other nonspecific abnormal finding of lung field: Secondary | ICD-10-CM | POA: Diagnosis not present

## 2022-02-05 MED ORDER — ALBUTEROL SULFATE (2.5 MG/3ML) 0.083% IN NEBU: 2.5000 mg | INHALATION_SOLUTION | Freq: Four times a day (QID) | RESPIRATORY_TRACT | 3 refills | Status: DC | PRN

## 2022-02-05 NOTE — Patient Instructions (Signed)
We refilled your nebulizer solution.  Continue using your Trelegy 1 puff daily.  We will make sure that your breathing tests get scheduled.  I recommend that you get both the flu vaccine and the RSV vaccine this year.  The RSV vaccine should be available at [facility] and [facility] very soon.  We will see you in follow-up in 6 months time call sooner should any new problems arise.

## 2022-02-05 NOTE — Progress Notes (Addendum)
Subjective:    Patient ID: Stacey Durham, female    DOB: [birth year], 76 y.o.   MRN: [id number] Patient Care Team: Eustaquio Boyden, MD as PCP - General (Family Medicine) Willis Modena, MD as Consulting Physician (Gastroenterology) Carrie Mew, OD as Consulting Physician [facility])  Chief Complaint  Patient presents with   Follow-up   HPI Patient is a 76 year old former smoker (50 PY) who presents for follow-up on the issue of COPD.  I first evaluated this patient on 04 November 2021, for the details of that visit please refer to that note.  Patient was scheduled at that time to have pulmonary function testing done however this has not been achieved yet.  The patient has been maintained on Trelegy Ellipta 100, and as needed albuterol.  She has been doing well with the Trelegy, has not needed to use rescue inhaler at all.  She is currently in the process of to [facility].  She has not had any fevers, chills or sweats.  No exacerbations since her episode in May this year.  She has not had any cough or sputum production.  No hemoptysis.  She does not endorse any other symptomatology.  Overall she feels well and looks well.  Called that she was noted to have some nodules of concern on the CT chest performed 05 September 2021.  However, subsequent CT angio chest performed 19 May showed partial to almost complete resolution of these nodules.  She has a follow-up film scheduled for December of this year to follow-up on this issue.  These are likely inflammatory.   DATA 10/04/2015 PFTs: FEV1 1.10 L or 46% predicted, FVC 2.10 L or 67% predicted, FEV1/FVC 52%, there was no significant bronchodilator response.  Lung volumes were within normal.  Diffusion capacity was mildly reduced.  Consistent with COPD.  Review of Systems A 10 point review of systems was performed and it is as noted above otherwise negative.  Patient Active Problem List   Diagnosis Date Noted   Skin nodule 09/17/2021   Left  flank pain 09/07/2021   Urinary frequency 09/07/2021   Skin lesion of cheek 08/12/2021   Hematoma of arm, left, sequela 04/01/2021   Medicare annual wellness visit, subsequent 08/07/2020   Urge incontinence of urine 08/07/2020   Grade III diastolic dysfunction 12/16/2019   CKD (chronic kidney disease) stage 3, GFR 30-59 ml/min (HCC) 07/28/2018   Chronic diarrhea 05/10/2018   Chronic respiratory failure with hypoxia (HCC) 01/29/2018   Osteopenia 09/25/2017   Pulmonary nodule 02/02/2017   Neck pain 01/30/2017   Health maintenance examination 06/26/2016   Advanced care planning/counseling discussion 06/26/2016   Incontinence of feces with fecal urgency 06/26/2016   HLD (hyperlipidemia) 06/26/2016   Prediabetes 06/26/2016   Hair loss 01/07/2016   Subclinical hypothyroidism 01/07/2016   Aortic atherosclerosis (HCC) 01/07/2016   CAD (coronary artery disease) 01/07/2016   COPD exacerbation (HCC) 11/26/2015   Ex-smoker 09/25/2015   Chronic obstructive pulmonary disease (HCC) 08/16/2015   Social History   Tobacco Use   Smoking status: Former    Packs/day: 1.25    Years: 40.00    Total pack years: 50.00    Types: Cigarettes    Start date: 1964    Quit date: 2003    Years since quitting: 20.6   Smokeless tobacco: Never  Substance Use Topics   Alcohol use: No   Allergies  Allergen Reactions   Tramadol Other (See Comments)    Lightheaded, imbalance   Current Meds  Medication  Sig   albuterol (VENTOLIN HFA) 108 (90 Base) MCG/ACT inhaler Inhale 2 puffs into the lungs every 4 (four) hours as needed for wheezing or shortness of breath.   atorvastatin (LIPITOR) 20 MG tablet TAKE 1 TABLET BY MOUTH EVERY DAY   Biotin 16606 MCG TABS Take 10 mg by mouth daily.    calcium-vitamin D (OSCAL WITH D) 500-200 MG-UNIT tablet Take 1 tablet by mouth.   Dextromethorphan-Guaifenesin (MUCINEX DM PO) Take 1 tablet by mouth daily.   Fluticasone-Umeclidin-Vilant (TRELEGY ELLIPTA) 100-62.5-25 MCG/ACT  AEPB Inhale 1 puff into the lungs daily.   ipratropium-albuterol (DUONEB) 0.5-2.5 (3) MG/3ML SOLN Take 3 mLs by nebulization every 4 (four) hours as needed.   loperamide (IMODIUM A-D) 2 MG tablet Take 1 tablet (2 mg total) by mouth daily as needed for diarrhea or loose stools.   montelukast (SINGULAIR) 10 MG tablet TAKE 1 TABLET BY MOUTH EVERY DAY   Multiple Vitamins-Minerals (MULTIVITAMIN ADULT PO) Take 1 tablet by mouth.   mupirocin cream (BACTROBAN) 2 % Apply 1 application topically 2 (two) times daily.   Probiotic Product (PROBIOTIC-10 PO) Take 1 tablet by mouth.   vitamin B-12 (CYANOCOBALAMIN) 1000 MCG tablet Take 1,000 mcg by mouth daily.   Immunization History  Administered Date(s) Administered   Fluad Quad(high Dose 65+) 02/03/2019   Influenza Split 01/30/2015   Influenza, High Dose Seasonal PF 02/07/2018, 02/23/2020, 03/14/2021   Influenza,inj,Quad PF,6+ Mos 01/31/2016, 06/24/2017   Influenza-Unspecified 06/05/2015   PFIZER(Purple Top)SARS-COV-2 Vaccination 07/16/2019, 08/09/2019, 02/09/2020   Pneumococcal Conjugate-13 06/05/2015   Pneumococcal Polysaccharide-23 03/28/2012   Tdap 06/23/2012      Objective:   Physical Exam BP 124/78 (BP Location: Left Arm, Cuff Size: Normal)   Pulse 85   Temp 97.6 F (36.4 C) (Temporal)   Ht 5\' 4"  (1.626 m)   Wt 144 lb 9.6 oz (65.6 kg)   SpO2 96%   BMI 24.82 kg/m  GENERAL: Well-developed, well-nourished woman, impeccably groomed, no acute distress.  Fully ambulatory, no conversational dyspnea. HEAD: Normocephalic, atraumatic.  EYES: Pupils equal, round, reactive to light.  No scleral icterus.  MOUTH: Oral mucosa moist. NECK: Supple. No thyromegaly. Trachea midline. No JVD.  No adenopathy. PULMONARY: Good air entry bilaterally.  Coarse, otherwise, no adventitious sounds. CARDIOVASCULAR: S1 and S2. Regular rate and rhythm.  No rubs, murmurs or gallops heard. ABDOMEN: Benign. MUSCULOSKELETAL: No joint deformity, no clubbing, no edema.   NEUROLOGIC: No overt focal deficit, no gait disturbance, speech is fluent. SKIN: Intact,warm,dry. PSYCH: Mood and behavior normal.      Assessment & Plan:     ICD-10-CM   1. COPD, severity to be determined (HCC)  J44.9 Pulmonary Function Test [facility] Only   Continue Trelegy Ellipta Continue as needed albuterol Needs PFTs, awaiting scheduling    2. Multiple lung nodules  R91.8    Has upcoming CT in December These appear to be inflammatory    3. Former smoker  Z87.891    No evidence of relapse     Orders Placed This Encounter  Procedures   Pulmonary Function Test [facility] Only    Standing Status:   Future    Standing Expiration Date:   02/06/2023    Scheduling Instructions:     3-14mo    Order Specific Question:   Full PFT: includes the following: basic spirometry, spirometry pre & post bronchodilator, diffusion capacity (DLCO), lung volumes    Answer:   Full PFT   Meds ordered this encounter  Medications   albuterol (PROVENTIL) (2.5 MG/3ML)  0.083% nebulizer solution    Sig: Take 3 mLs (2.5 mg total) by nebulization every 6 (six) hours as needed for wheezing or shortness of breath.    Dispense:  75 mL    Refill:  3    We will see the patient in follow-up in 6 months time.  We will let her know if any further studies need to be done after CT chest in December.  Patient is to let us know if she has any new respiratory issues in the interim.  Gailen Shelter, MD Advanced Bronchoscopy PCCM [facility]    *This note was dictated using voice recognition software/Dragon.  Despite best efforts to proofread, errors can occur which can change the meaning. Any transcriptional errors that result from this process are unintentional and may not be fully corrected at the time of dictation.

## 2022-03-19 IMAGING — CT CT ANGIO CHEST
2 of 6 series · 19 of 46 positions shown · IV contrast (APPLIED)
Comparison: CT scan dated 01/25/2017 and chest x-ray 04/03/2019

CLINICAL DATA: Shortness of breath.  Hypoxemia.  COPD.

EXAM:
CT ANGIOGRAPHY CHEST WITH CONTRAST
TECHNIQUE: Multidetector CT imaging of the chest was performed using the
standard protocol during bolus administration of intravenous
contrast. Multiplanar CT image reconstructions and MIPs were
obtained to evaluate the vascular anatomy.
CONTRAST:  75mL OMNIPAQUE IOHEXOL 350 MG/ML SOLN

[Series 5: thins · axial · 0.62mm/px · z∈[-338,-50]mm · 16 of 316 slices shown]
[im 14/316  lung]
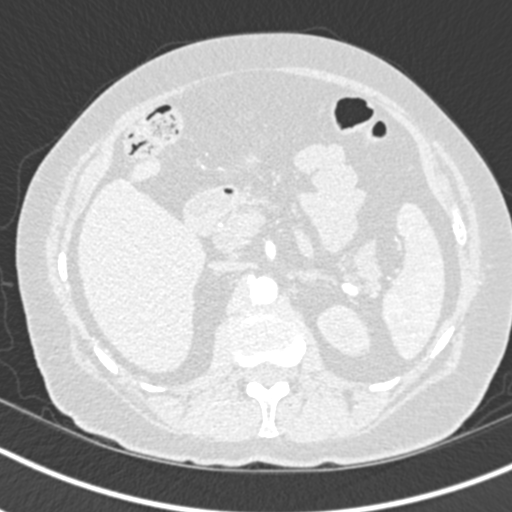
[im 42/316  soft-tissue]
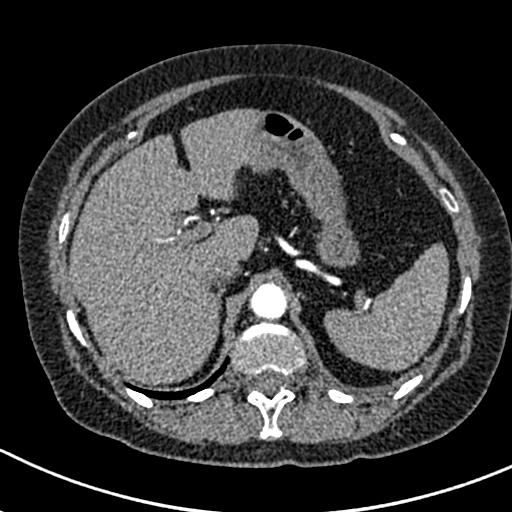
[im 55/316  lung]
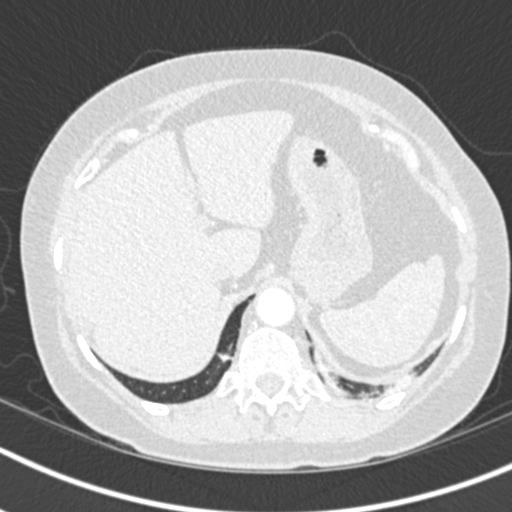
[im 69/316  soft-tissue]
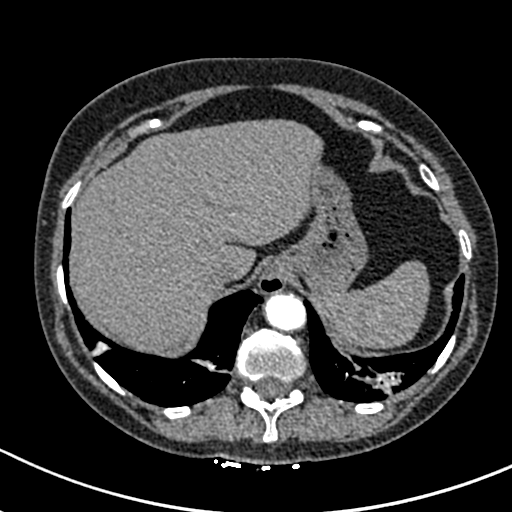
[im 96/316  lung]
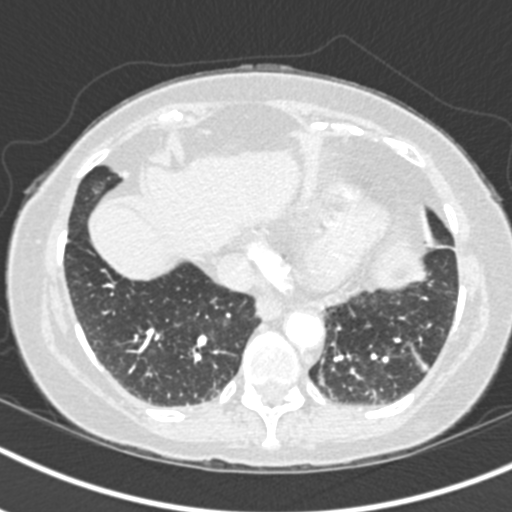
[im 110/316  soft-tissue]
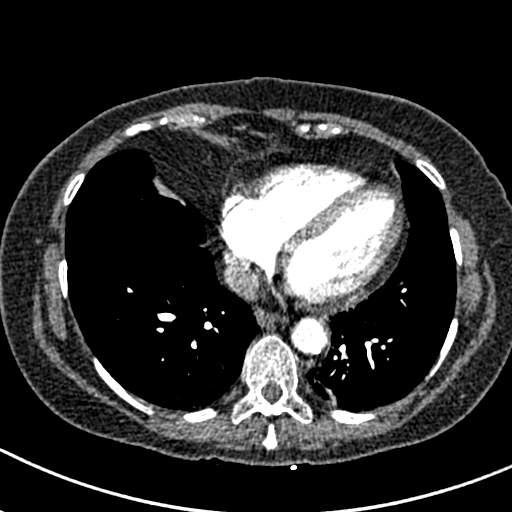
[im 124/316  lung]
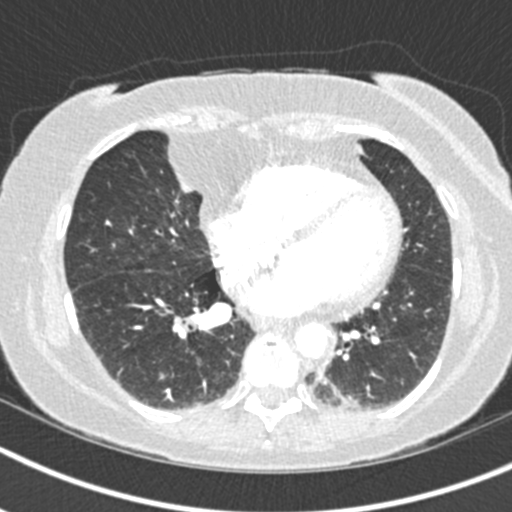
[im 151/316  soft-tissue]
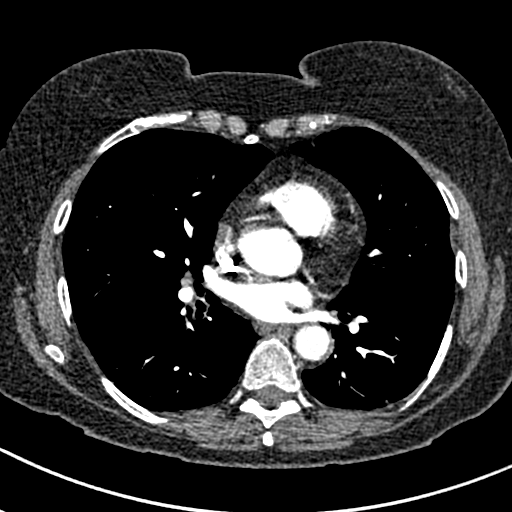
[im 165/316  lung]
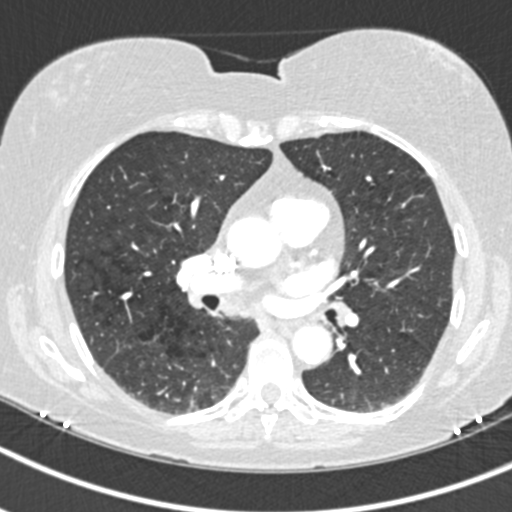
[im 192/316  soft-tissue]
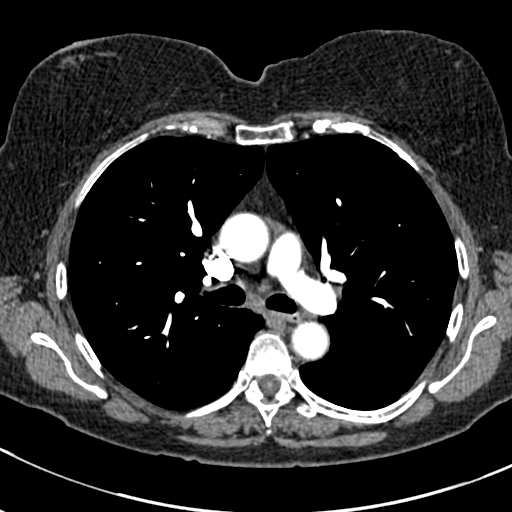
[im 206/316  lung]
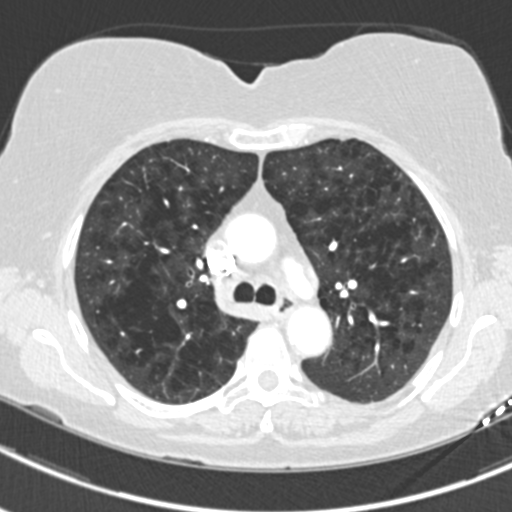
[im 220/316  soft-tissue]
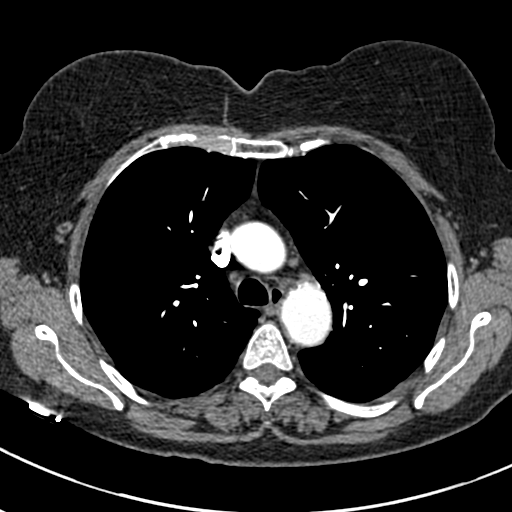
[im 247/316  lung]
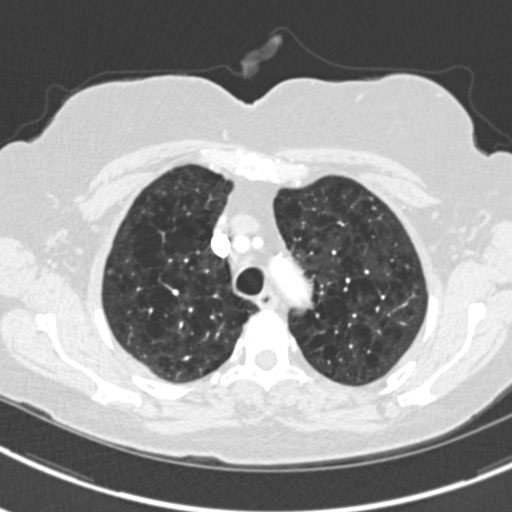
[im 261/316  soft-tissue]
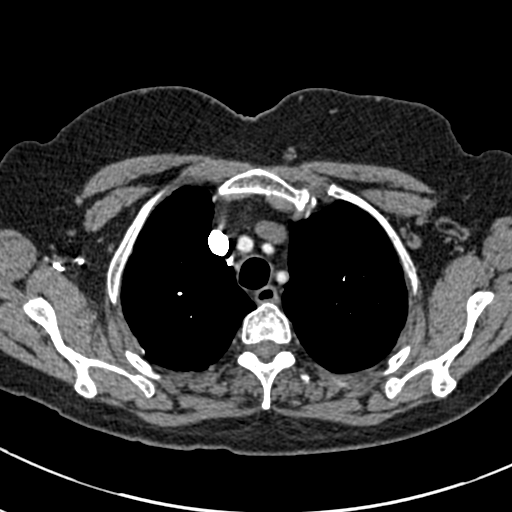
[im 274/316  lung]
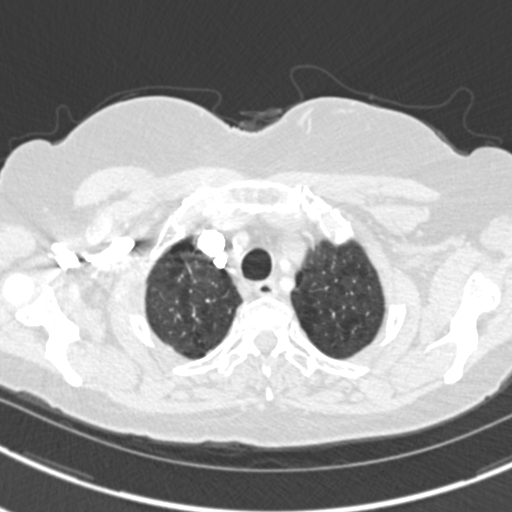
[im 302/316  soft-tissue]
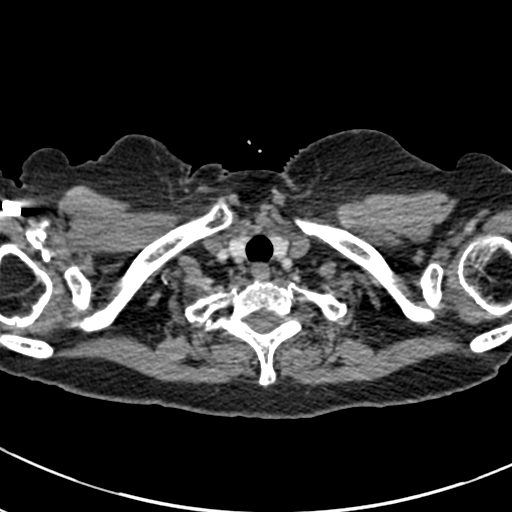

[Series 7: coronal mpr · coronal · 0.62mm/px · 3 of 97 slices shown]
[im 25/97  soft-tissue]
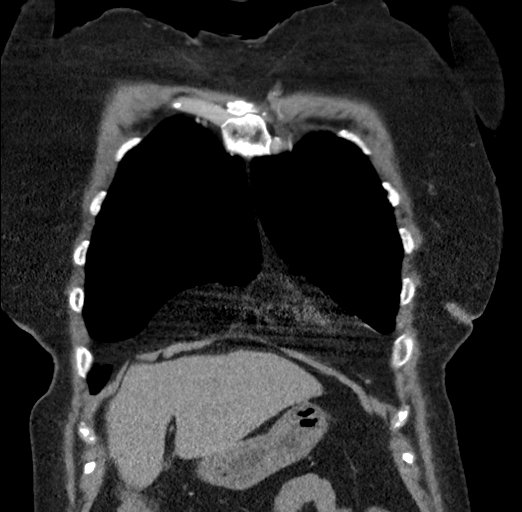
[im 49/97  soft-tissue]
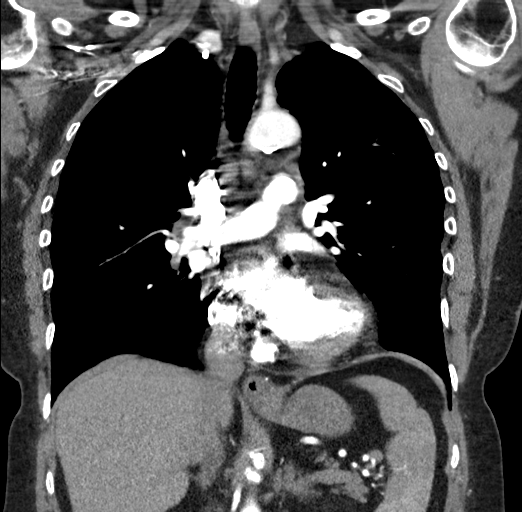
[im 73/97  soft-tissue]
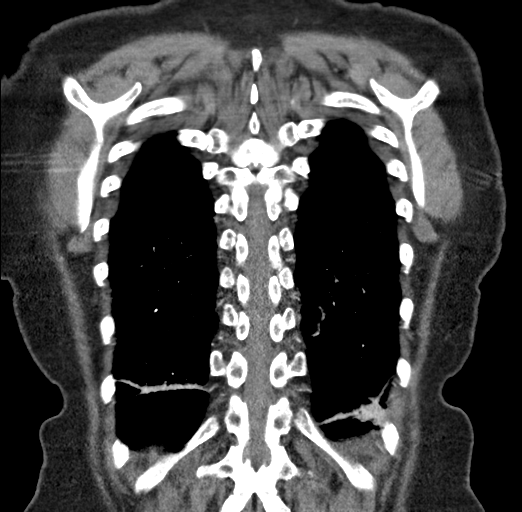

[19 of 46 positions shown; findings below may reference images not displayed]

FINDINGS: Cardiovascular: Satisfactory opacification of the pulmonary arteries
to the segmental level. No evidence of pulmonary embolism. Normal
heart size. No pericardial effusion. Aortic atherosclerosis. No
dissection.

Mediastinum/Nodes: The thyroid gland and trachea are normal. Small
hiatal hernia. Small stable lymph nodes in the mediastinum and both
hilar regions.

Lungs/Pleura: Extensive emphysema primarily involving both upper
lobes. Focal atelectasis at both lung bases superimposed on slight
scarring at the left lung base. No effusions.

Upper Abdomen: No acute abnormality.

Musculoskeletal: No chest wall abnormality. No acute or significant
osseous findings.

Review of the MIP images confirms the above findings.
IMPRESSION: 1. No pulmonary emboli.
2. Extensive emphysema.
3. Focal atelectasis at both lung bases superimposed on slight
scarring at the left lung base.
4. Emphysema and aortic atherosclerosis.

Aortic Atherosclerosis (R1F7C-5T7.7) and Emphysema (R1F7C-AF9.P).

## 2022-04-25 ENCOUNTER — Other Ambulatory Visit: Payer: Self-pay | Admitting: Internal Medicine

## 2022-05-06 ENCOUNTER — Ambulatory Visit: Payer: No Typology Code available for payment source

## 2022-05-13 ENCOUNTER — Ambulatory Visit
Admission: RE | Admit: 2022-05-13 | Discharge: 2022-05-13 | Disposition: A | Discharge status: Home / Self Care | Payer: No Typology Code available for payment source | Source: Ambulatory Visit | Attending: Pulmonary Disease | Admitting: Pulmonary Disease

## 2022-05-13 DIAGNOSIS — J439 Emphysema, unspecified: Secondary | ICD-10-CM | POA: Diagnosis not present

## 2022-05-13 DIAGNOSIS — R918 Other nonspecific abnormal finding of lung field: Secondary | ICD-10-CM | POA: Diagnosis not present

## 2022-05-13 DIAGNOSIS — J8410 Pulmonary fibrosis, unspecified: Secondary | ICD-10-CM | POA: Diagnosis not present

## 2022-05-20 DIAGNOSIS — N1831 Chronic kidney disease, stage 3a: Secondary | ICD-10-CM | POA: Diagnosis not present

## 2022-05-20 DIAGNOSIS — Z008 Encounter for other general examination: Secondary | ICD-10-CM | POA: Diagnosis not present

## 2022-05-20 DIAGNOSIS — Z6824 Body mass index (BMI) 24.0-24.9, adult: Secondary | ICD-10-CM | POA: Diagnosis not present

## 2022-05-20 DIAGNOSIS — F17211 Nicotine dependence, cigarettes, in remission: Secondary | ICD-10-CM | POA: Diagnosis not present

## 2022-05-20 DIAGNOSIS — J449 Chronic obstructive pulmonary disease, unspecified: Secondary | ICD-10-CM | POA: Diagnosis not present

## 2022-05-20 DIAGNOSIS — I700 Atherosclerosis of aorta: Secondary | ICD-10-CM | POA: Diagnosis not present

## 2022-05-20 DIAGNOSIS — E785 Hyperlipidemia, unspecified: Secondary | ICD-10-CM | POA: Diagnosis not present

## 2022-05-21 ENCOUNTER — Ambulatory Visit: Payer: No Typology Code available for payment source | Attending: Pulmonary Disease

## 2022-05-21 DIAGNOSIS — J449 Chronic obstructive pulmonary disease, unspecified: Secondary | ICD-10-CM | POA: Diagnosis not present

## 2022-05-21 DIAGNOSIS — Z87891 Personal history of nicotine dependence: Secondary | ICD-10-CM | POA: Insufficient documentation

## 2022-05-21 LAB — PULMONARY FUNCTION TEST ARMC ONLY
DL/VA % pred: 57 %
DL/VA: 2.31 ml/min/mmHg/L
DLCO unc % pred: 51 %
DLCO unc: 10.04 ml/min/mmHg
FEF 25-75 Post: 0.49 L/sec
FEF 25-75 Pre: 0.52 L/sec
FEF2575-%Change-Post: -5 %
FEF2575-%Pred-Post: 30 %
FEF2575-%Pred-Pre: 32 %
FEV1-%Change-Post: 0 %
FEV1-%Pred-Post: 56 %
FEV1-%Pred-Pre: 55 %
FEV1-Post: 1.21 L
FEV1-Pre: 1.19 L
FEV1FVC-%Change-Post: 0 %
FEV1FVC-%Pred-Pre: 68 %
FEV6-%Change-Post: 0 %
FEV6-%Pred-Post: 81 %
FEV6-%Pred-Pre: 82 %
FEV6-Post: 2.22 L
FEV6-Pre: 2.24 L
FEV6FVC-%Change-Post: -1 %
FEV6FVC-%Pred-Post: 99 %
FEV6FVC-%Pred-Pre: 100 %
FVC-%Change-Post: 0 %
FVC-%Pred-Post: 82 %
FVC-%Pred-Pre: 81 %
FVC-Post: 2.35 L
FVC-Pre: 2.34 L
Post FEV1/FVC ratio: 51 %
Post FEV6/FVC ratio: 94 %
Pre FEV1/FVC ratio: 51 %
Pre FEV6/FVC Ratio: 96 %

## 2022-05-29 ENCOUNTER — Other Ambulatory Visit: Payer: Self-pay | Admitting: Family Medicine

## 2022-05-29 ENCOUNTER — Ambulatory Visit
Admission: RE | Admit: 2022-05-29 | Discharge: 2022-05-29 | Disposition: A | Discharge status: Home / Self Care | Payer: No Typology Code available for payment source | Source: Ambulatory Visit | Attending: Family Medicine | Admitting: Family Medicine

## 2022-05-29 DIAGNOSIS — R921 Mammographic calcification found on diagnostic imaging of breast: Secondary | ICD-10-CM

## 2022-06-14 ENCOUNTER — Other Ambulatory Visit: Payer: Self-pay | Admitting: Pulmonary Disease

## 2022-07-08 ENCOUNTER — Telehealth: Payer: Self-pay | Admitting: Family Medicine

## 2022-07-08 NOTE — Telephone Encounter (Signed)
LVM for pt to rtn my call to schedule AWV, LABS and CPE with PCP call back # [phone]

## 2022-07-21 ENCOUNTER — Telehealth: Payer: Self-pay | Admitting: Family Medicine

## 2022-07-21 NOTE — Telephone Encounter (Signed)
Contacted Candiss Norse [place] to schedule their annual wellness visit. Appointment made for 08/13/2022.  [place] Direct Dial: [phone]

## 2022-07-29 ENCOUNTER — Encounter: Payer: Self-pay | Admitting: Pulmonary Disease

## 2022-07-29 ENCOUNTER — Ambulatory Visit (INDEPENDENT_AMBULATORY_CARE_PROVIDER_SITE_OTHER): Payer: No Typology Code available for payment source | Admitting: Pulmonary Disease

## 2022-07-29 VITALS — BP 130/76 | HR 73 | Temp 97.5°F | Ht 64.0 in | Wt 143.8 lb

## 2022-07-29 DIAGNOSIS — Z87891 Personal history of nicotine dependence: Secondary | ICD-10-CM

## 2022-07-29 DIAGNOSIS — R918 Other nonspecific abnormal finding of lung field: Secondary | ICD-10-CM

## 2022-07-29 DIAGNOSIS — J449 Chronic obstructive pulmonary disease, unspecified: Secondary | ICD-10-CM

## 2022-07-29 NOTE — Progress Notes (Signed)
Subjective:    Patient ID: Stacey Durham, female    DOB: 1946/03/06, 77 y.o.   MRN: [id number] Patient Care Team: Ria Bush, MD as PCP - General (Family Medicine) Arta Silence, MD as Consulting Physician (Gastroenterology) Bryson Ha, OD as Consulting Physician  [facility])  Chief Complaint  Patient presents with   Follow-up    SOB with exertion. No wheezing or cough.     HPI Patient is a 77 year old former smoker (quit 2003, 66 PY) who presents for follow-up on the issue of COPD and lung nodules today.  I last saw her on June 2023, that was her first visit with me.  She was previously following with Dr. [name]. Of note the patient had had a CT chest on 05 September 2021 that showed some nodules particularly on the left base that were of concern.  However the CT angio performed on 19 May in the emergency room showed almost complete resolution of these nodules.  These were likely inflammatory/infectious in nature.  A CT chest performed 13 May 2022, showed a solution of any residual nodules.  There was mild scarring and subsegmental atelectasis of both lower lobes and evidence of emphysema.  There was also a calcified granuloma in the left upper lobe, benign.  She had PFTs obtained on 21 May 2022 this shows that she has moderate COPD with an FEV1 of 1.19 L or 55% predicted, FVC of 213 4 L or 81% predicted and FEV1/FVC 51%.  There was no bronchodilator response.  The patient could not perform lung volumes due to claustrophobia and inability to get into the body box.  Diffusion capacity is moderately reduced.  Overall however the study was markedly improved from her prior study of 2017.  Patient presents today without any new issues.  No fevers, chills or sweats.  No cough or sputum production.  No hemoptysis.  No weight loss or anorexia.  She is compliant with Trelegy and as needed albuterol.  She notes that this regimen is working well for her.  Her dyspnea is stable  and at baseline.  Overall she feels well and looks well.  Review of Systems A 10 point review of systems was performed and it is as noted above otherwise negative.  Patient Active Problem List   Diagnosis Date Noted   Skin nodule 09/17/2021   Left flank pain 09/07/2021   Urinary frequency 09/07/2021   Skin lesion of cheek 08/12/2021   Hematoma of arm, left, sequela 04/01/2021   Medicare annual wellness visit, subsequent 08/07/2020   Urge incontinence of urine 08/07/2020   Grade III diastolic dysfunction [id number]   CKD (chronic kidney disease) stage 3, GFR 30-59 ml/min (HCC) 07/28/2018   Chronic diarrhea 05/10/2018   Chronic respiratory failure with hypoxia ([place]) 01/29/2018   Osteopenia 09/25/2017   Pulmonary nodule 02/02/2017   Neck pain 01/30/2017   Health maintenance examination 06/26/2016   Advanced care planning/counseling discussion 06/26/2016   Incontinence of feces with fecal urgency 06/26/2016   HLD (hyperlipidemia) 06/26/2016   Prediabetes 06/26/2016   Hair loss 01/07/2016   Subclinical hypothyroidism 01/07/2016   Aortic atherosclerosis ([place]) 01/07/2016   CAD (coronary artery disease) 01/07/2016   COPD exacerbation ([place]) 11/26/2015   Ex-smoker 09/25/2015   Chronic obstructive pulmonary disease ([place]) 08/16/2015   Social History   Tobacco Use   Smoking status: Former    Packs/day: 1.25    Years: 40.00    Total pack years: 50.00    Types: Cigarettes  Start date: 1964    Quit date: 2003    Years since quitting: 21.1   Smokeless tobacco: Never  Substance Use Topics   Alcohol use: No   Allergies  Allergen Reactions   Hydrocodone-Acetaminophen Other (See Comments)   Tramadol Other (See Comments)    Lightheaded, imbalance   Current Meds  Medication Sig   albuterol (PROVENTIL) (2.5 MG/3ML) 0.083% nebulizer solution Take 3 mLs (2.5 mg total) by nebulization every 6 (six) hours as needed for wheezing or shortness of breath.   albuterol (VENTOLIN HFA) 108  (90 Base) MCG/ACT inhaler INHALE 2 PUFFS INTO THE LUNGS EVERY 4 HOURS AS NEEDED FOR WHEEZING OR SHORTNESS OF BREATH.   atorvastatin (LIPITOR) 20 MG tablet TAKE 1 TABLET BY MOUTH EVERY DAY   Biotin 10000 MCG TABS Take 10 mg by mouth daily.    calcium-vitamin D (OSCAL WITH D) 500-200 MG-UNIT tablet Take 1 tablet by mouth.   Dextromethorphan-Guaifenesin (MUCINEX DM PO) Take 1 tablet by mouth daily.   loperamide (IMODIUM A-D) 2 MG tablet Take 1 tablet (2 mg total) by mouth daily as needed for diarrhea or loose stools.   montelukast (SINGULAIR) 10 MG tablet TAKE 1 TABLET BY MOUTH EVERY DAY   Multiple Vitamins-Minerals (MULTIVITAMIN ADULT PO) Take 1 tablet by mouth.   mupirocin cream (BACTROBAN) 2 % Apply 1 application topically 2 (two) times daily.   Probiotic Product (PROBIOTIC-10 PO) Take 1 tablet by mouth.   TRELEGY ELLIPTA 100-62.5-25 MCG/ACT AEPB TAKE 1 PUFF BY MOUTH EVERY DAY   vitamin B-12 (CYANOCOBALAMIN) 1000 MCG tablet Take 1,000 mcg by mouth daily.   Immunization History  Administered Date(s) Administered   Fluad Quad(high Dose 65+) 02/03/2019, 01/30/2022   Influenza Split 01/30/2015   Influenza, High Dose Seasonal PF 02/07/2018, 02/23/2020, 03/14/2021   Influenza,inj,Quad PF,6+ Mos 01/31/2016, 06/24/2017   Influenza-Unspecified 06/05/2015   PFIZER(Purple Top)SARS-COV-2 Vaccination 07/16/2019, 08/09/2019, 02/09/2020   Pneumococcal Conjugate-13 06/05/2015   Pneumococcal Polysaccharide-23 03/28/2012   Tdap 06/23/2012       Objective:   Physical Exam BP 130/76 (BP Location: Left Arm, Cuff Size: Normal)   Pulse 73   Temp (!) 97.5 F (36.4 C)   Ht '5\' 4"'$  (1.626 m)   Wt 143 lb 12.8 oz (65.2 kg)   SpO2 98%   BMI 24.68 kg/m   SpO2: 98 % O2 Device: None (Room air)  GENERAL: Well-developed, well-nourished woman, impeccably groomed, no acute distress.  Fully ambulatory, no conversational dyspnea. HEAD: Normocephalic, atraumatic.  EYES: Pupils equal, round, reactive to light.  No  scleral icterus.  MOUTH: Oral mucosa moist. NECK: Supple. No thyromegaly. Trachea midline. No JVD.  No adenopathy. PULMONARY: Good air entry bilaterally.  Coarse otherwise, no adventitious sounds. CARDIOVASCULAR: S1 and S2. Regular rate and rhythm.  No rubs, murmurs or gallops heard. ABDOMEN: Benign. MUSCULOSKELETAL: No joint deformity, no clubbing, no edema.  NEUROLOGIC: No overt focal deficit, no gait disturbance, speech is fluent. SKIN: Intact,warm,dry. PSYCH: Mood and behavior normal      Assessment & Plan:     ICD-10-CM   1. Stage 2 moderate COPD by GOLD classification (HCC)  J44.9    Continue Trelegy Ellipta 100, 1 inhalation daily Continue as needed albuterol    2. Multiple lung nodules  R91.8    RESOLVED Granuloma in the left upper lobe, benign    3. Former smoker  Z87.891    No evidence of relapse     Patient is to continue her current regimen.  She is doing well.  We will see her in follow-up in 6 months time she is to contact us prior to that time should any new difficulties arise.  Renold Don, MD Advanced Bronchoscopy PCCM [facility]    *This note was dictated using voice recognition software/Dragon.  Despite best efforts to proofread, errors can occur which can change the meaning. Any transcriptional errors that result from this process are unintentional and may not be fully corrected at the time of dictation.

## 2022-07-29 NOTE — Patient Instructions (Signed)
We discussed today that you are chest CT showed that the previously noted nodule had completely resolved.  We also discussed your breathing test and these are actually IMPROVED from the ones in 2017 so that is also good news.  You have moderate COPD.  Continue taking your Trelegy and your as needed albuterol.  Follow-up in 6 months time call sooner should any new problems arise.

## 2022-08-12 ENCOUNTER — Telehealth: Payer: Self-pay | Admitting: Family Medicine

## 2022-08-12 NOTE — Telephone Encounter (Signed)
Patient would like a phone call to reschedule visit on 08/13/2022. She thought it was on today 08/12/2022,so she came into the office for it,however when she was told it was tomorrow,she said that she couldn't make it.

## 2022-08-13 ENCOUNTER — Ambulatory Visit: Payer: No Typology Code available for payment source

## 2022-08-18 ENCOUNTER — Ambulatory Visit (INDEPENDENT_AMBULATORY_CARE_PROVIDER_SITE_OTHER): Payer: No Typology Code available for payment source

## 2022-08-18 VITALS — Ht 64.0 in | Wt 140.0 lb

## 2022-08-18 DIAGNOSIS — Z0000 Encounter for general adult medical examination without abnormal findings: Secondary | ICD-10-CM | POA: Diagnosis not present

## 2022-08-18 DIAGNOSIS — Z780 Asymptomatic menopausal state: Secondary | ICD-10-CM

## 2022-08-18 NOTE — Patient Instructions (Addendum)
Stacey Durham , Thank you for taking time to come for your Medicare Wellness Visit. I appreciate your ongoing commitment to your health goals. Please review the following plan we discussed and let me know if I can assist you in the future.   These are the goals we discussed:  Goals      Increase physical activity     Starting 07/21/18, I will continue to exercise for 45 minutes 3 days per week.      Patient Stated     07/28/2019, I will maintain and continue medications as prescribed.      Patient Stated     Stay Healthy.        This is a list of the screening recommended for you and due dates:  Health Maintenance  Topic Date Due   Zoster (Shingles) Vaccine (1 of 2) Never done   Colon Cancer Screening  08/12/2021   COVID-19 Vaccine (4 - 2023-24 season) 01/30/2022   DTaP/Tdap/Td vaccine (2 - Td or Tdap) 06/23/2022   Mammogram  11/27/2022   Medicare Annual Wellness Visit  08/18/2023   Pneumonia Vaccine  Completed   Flu Shot  Completed   DEXA scan (bone density measurement)  Completed   Hepatitis C Screening: USPSTF Recommendation to screen - Ages 18-79 yo.  Completed   HPV Vaccine  Aged Out    Advanced directives: Advance directive discussed with you today. Even though you declined this today, please call our office should you change your mind, and we can give you the proper paperwork for you to fill out.   Conditions/risks identified: Aim for 30 minutes of exercise or brisk walking, 6-8 glasses of water, and 5 servings of fruits and vegetables each day.   Next appointment: Follow up in one year for your annual wellness visit 08/23/23 @ 1:00 via telephone.   Preventive Care 78 Years and Older, Female Preventive care refers to lifestyle choices and visits with your health care provider that can promote health and wellness. What does preventive care include? A yearly physical exam. This is also called an annual well check. Dental exams once or twice a year. Routine eye exams.  Ask your health care provider how often you should have your eyes checked. Personal lifestyle choices, including: Daily care of your teeth and gums. Regular physical activity. Eating a healthy diet. Avoiding tobacco and drug use. Limiting alcohol use. Practicing safe sex. Taking low-dose aspirin every day. Taking vitamin and mineral supplements as recommended by your health care provider. What happens during an annual well check? The services and screenings done by your health care provider during your annual well check will depend on your age, overall health, lifestyle risk factors, and family history of disease. Counseling  Your health care provider may ask you questions about your: Alcohol use. Tobacco use. Drug use. Emotional well-being. Home and relationship well-being. Sexual activity. Eating habits. History of falls. Memory and ability to understand (cognition). Work and work Statistician. Reproductive health. Screening  You may have the following tests or measurements: Height, weight, and BMI. Blood pressure. Lipid and cholesterol levels. These may be checked every 5 years, or more frequently if you are over 75 years old. Skin check. Lung cancer screening. You may have this screening every year starting at age 10 if you have a 30-pack-year history of smoking and currently smoke or have quit within the past 15 years. Fecal occult blood test (FOBT) of the stool. You may have this test every year starting at age  50. Flexible sigmoidoscopy or colonoscopy. You may have a sigmoidoscopy every 5 years or a colonoscopy every 10 years starting at age 72. Hepatitis C blood test. Hepatitis B blood test. Sexually transmitted disease (STD) testing. Diabetes screening. This is done by checking your blood sugar (glucose) after you have not eaten for a while (fasting). You may have this done every 1-3 years. Bone density scan. This is done to screen for osteoporosis. You may have this  done starting at age 78. Mammogram. This may be done every 1-2 years. Talk to your health care provider about how often you should have regular mammograms. Talk with your health care provider about your test results, treatment options, and if necessary, the need for more tests. Vaccines  Your health care provider may recommend certain vaccines, such as: Influenza vaccine. This is recommended every year. Tetanus, diphtheria, and acellular pertussis (Tdap, Td) vaccine. You may need a Td booster every 10 years. Zoster vaccine. You may need this after age 19. Pneumococcal 13-valent conjugate (PCV13) vaccine. One dose is recommended after age 76. Pneumococcal polysaccharide (PPSV23) vaccine. One dose is recommended after age 18. Talk to your health care provider about which screenings and vaccines you need and how often you need them. This information is not intended to replace advice given to you by your health care provider. Make sure you discuss any questions you have with your health care provider. Document Released: 06/14/2015 Document Revised: 02/05/2016 Document Reviewed: 03/19/2015 Elsevier Interactive Patient Education  2017 [employer] Prevention in the Home Falls can cause injuries. They can happen to people of all ages. There are many things you can do to make your home safe and to help prevent falls. What can I do on the outside of my home? Regularly fix the edges of walkways and driveways and fix any cracks. Remove anything that might make you trip as you walk through a door, such as a raised step or threshold. Trim any bushes or trees on the path to your home. Use bright outdoor lighting. Clear any walking paths of anything that might make someone trip, such as rocks or tools. Regularly check to see if handrails are loose or broken. Make sure that both sides of any steps have handrails. Any raised decks and porches should have guardrails on the edges. Have any leaves,  snow, or ice cleared regularly. Use sand or salt on walking paths during winter. Clean up any spills in your garage right away. This includes oil or grease spills. What can I do in the bathroom? Use night lights. Install grab bars by the toilet and in the tub and shower. Do not use towel bars as grab bars. Use non-skid mats or decals in the tub or shower. If you need to sit down in the shower, use a plastic, non-slip stool. Keep the floor dry. Clean up any water that spills on the floor as soon as it happens. Remove soap buildup in the tub or shower regularly. Attach bath mats securely with double-sided non-slip rug tape. Do not have throw rugs and other things on the floor that can make you trip. What can I do in the bedroom? Use night lights. Make sure that you have a light by your bed that is easy to reach. Do not use any sheets or blankets that are too big for your bed. They should not hang down onto the floor. Have a firm chair that has side arms. You can use this for support while you  get dressed. Do not have throw rugs and other things on the floor that can make you trip. What can I do in the kitchen? Clean up any spills right away. Avoid walking on wet floors. Keep items that you use a lot in easy-to-reach places. If you need to reach something above you, use a strong step stool that has a grab bar. Keep electrical cords out of the way. Do not use floor polish or wax that makes floors slippery. If you must use wax, use non-skid floor wax. Do not have throw rugs and other things on the floor that can make you trip. What can I do with my stairs? Do not leave any items on the stairs. Make sure that there are handrails on both sides of the stairs and use them. Fix handrails that are broken or loose. Make sure that handrails are as long as the stairways. Check any carpeting to make sure that it is firmly attached to the stairs. Fix any carpet that is loose or worn. Avoid having throw  rugs at the top or bottom of the stairs. If you do have throw rugs, attach them to the floor with carpet tape. Make sure that you have a light switch at the top of the stairs and the bottom of the stairs. If you do not have them, ask someone to add them for you. What else can I do to help prevent falls? Wear shoes that: Do not have high heels. Have rubber bottoms. Are comfortable and fit you well. Are closed at the toe. Do not wear sandals. If you use a stepladder: Make sure that it is fully opened. Do not climb a closed stepladder. Make sure that both sides of the stepladder are locked into place. Ask someone to hold it for you, if possible. Clearly mark and make sure that you can see: Any grab bars or handrails. First and last steps. Where the edge of each step is. Use tools that help you move around (mobility aids) if they are needed. These include: Canes. Walkers. Scooters. Crutches. Turn on the lights when you go into a dark area. Replace any light bulbs as soon as they burn out. Set up your furniture so you have a clear path. Avoid moving your furniture around. If any of your floors are uneven, fix them. If there are any pets around you, be aware of where they are. Review your medicines with your doctor. Some medicines can make you feel dizzy. This can increase your chance of falling. Ask your doctor what other things that you can do to help prevent falls. This information is not intended to replace advice given to you by your health care provider. Make sure you discuss any questions you have with your health care provider. Document Released: 03/14/2009 Document Revised: 10/24/2015 Document Reviewed: 06/22/2014 Elsevier Interactive Patient Education  2017 [employer].

## 2022-08-18 NOTE — Progress Notes (Signed)
I connected with  Stacey Durham on 08/18/22 by a audio enabled telemedicine application and verified that I am speaking with the correct person using two identifiers.  Patient Location: Home  Provider Location: Office/Clinic  I discussed the limitations of evaluation and management by telemedicine. The patient expressed understanding and agreed to proceed.  Subjective:   Stacey Durham is a 77 y.o. female who presents for Medicare Annual (Subsequent) preventive examination.  Review of Systems      Cardiac Risk Factors include: advanced age (>64men, >65 women);sedentary lifestyle     Objective:    Today's Vitals   08/18/22 1510  Weight: 140 lb (63.5 kg)  Height: 5\' 4"  (1.626 m)  PainSc: 5    Body mass index is 24.03 kg/m.     08/18/2022    3:17 PM 10/17/2021    9:40 AM 12/10/2019    4:42 PM 12/10/2019   11:38 AM 07/28/2019   11:09 AM 04/03/2019    4:28 PM 03/30/2019    6:18 PM  Advanced Directives  Does Patient Have a Medical Advance Directive? No No No No No No No  Would patient like information on creating a medical advance directive? No - Patient declined  No - Patient declined No - Patient declined No - Patient declined  No - Patient declined    Current Medications (verified) Outpatient Encounter Medications as of 08/18/2022  Medication Sig   albuterol (PROVENTIL) (2.5 MG/3ML) 0.083% nebulizer solution Take 3 mLs (2.5 mg total) by nebulization every 6 (six) hours as needed for wheezing or shortness of breath.   albuterol (VENTOLIN HFA) 108 (90 Base) MCG/ACT inhaler INHALE 2 PUFFS INTO THE LUNGS EVERY 4 HOURS AS NEEDED FOR WHEEZING OR SHORTNESS OF BREATH.   atorvastatin (LIPITOR) 20 MG tablet TAKE 1 TABLET BY MOUTH EVERY DAY   Biotin 10000 MCG TABS Take 10 mg by mouth daily.    calcium-vitamin D (OSCAL WITH D) 500-200 MG-UNIT tablet Take 1 tablet by mouth.   loperamide (IMODIUM A-D) 2 MG tablet Take 1 tablet (2 mg total) by mouth daily as needed for diarrhea or  loose stools.   montelukast (SINGULAIR) 10 MG tablet TAKE 1 TABLET BY MOUTH EVERY DAY   Multiple Vitamins-Minerals (MULTIVITAMIN ADULT PO) Take 1 tablet by mouth.   Probiotic Product (PROBIOTIC-10 PO) Take 1 tablet by mouth.   TRELEGY ELLIPTA 100-62.5-25 MCG/ACT AEPB TAKE 1 PUFF BY MOUTH EVERY DAY   vitamin B-12 (CYANOCOBALAMIN) 1000 MCG tablet Take 1,000 mcg by mouth daily.   Dextromethorphan-Guaifenesin (MUCINEX DM PO) Take 1 tablet by mouth daily. (Patient not taking: Reported on 08/18/2022)   mupirocin cream (BACTROBAN) 2 % Apply 1 application topically 2 (two) times daily. (Patient not taking: Reported on 08/18/2022)   No facility-administered encounter medications on file as of 08/18/2022.    Allergies (verified) Hydrocodone-acetaminophen and Tramadol   History: Past Medical History:  Diagnosis Date   Acute cholecystitis    Asthma    Cholecystitis with cholelithiasis 10/11/2015   COPD (chronic obstructive pulmonary disease) ([place])    Ex-smoker    quit 2000s   Pneumonia 11/26/2015   Past Surgical History:  Procedure Laterality Date   ABDOMINAL HYSTERECTOMY  1978   part of an ovary remained   [place], BILATERAL  2013   CHOLECYSTECTOMY N/A 10/11/2015   Procedure: LAPAROSCOPIC CHOLECYSTECTOMY;  Surgeon: Jules Husbands, MD;  Location: [facility] ORS;  Service: General;  Laterality: N/A;   COLONOSCOPY  07/2016  HP, ext hem, diverticulosis, rpt 5 yrs (Outlaw)   TONSILLECTOMY     Family History  Problem Relation Age of Onset   Diabetes Mother    Heart attack Father 61   Diabetes Father    Stroke Neg Hx    Cancer Neg Hx    Breast cancer Neg Hx    Social History   Socioeconomic History   Marital status: Married    Spouse name: Not on file   Number of children: Not on file   Years of education: Not on file   Highest education level: Not on file  Occupational History   Not on file  Tobacco Use   Smoking status: Former    Packs/day: 1.25     Years: 40.00    Additional pack years: 0.00    Total pack years: 50.00    Types: Cigarettes    Start date: 77    Quit date: September 04, 2001    Years since quitting: 21.2   Smokeless tobacco: Never  Vaping Use   Vaping Use: Never used  Substance and Sexual Activity   Alcohol use: No   Drug use: No   Sexual activity: Not on file  Other Topics Concern   Not on file  Social History Narrative   Originally from [place] - husband passed away 09/05/10   Lives alone, 1 chihuahua   GF of Stacey Durham   Occ: retired, worked at [employer]: enjoys going to [employer]: good water, fruits/vegetables daily   Social Determinants of Health   Financial Resource Strain: Low Risk  (08/18/2022)   Overall Financial Resource Strain (CARDIA)    Difficulty of Paying Living Expenses: Not hard at all  Food Insecurity: No Food Insecurity (08/18/2022)   Hunger Vital Sign    Worried About Running Out of Food in the Last Year: Never true    [place] in the Last Year: Never true  Transportation Needs: No Transportation Needs (08/18/2022)   PRAPARE - Hydrologist (Medical): No    Lack of Transportation (Non-Medical): No  Physical Activity: Inactive (08/18/2022)   Exercise Vital Sign    Days of Exercise per Week: 0 days    Minutes of Exercise per Session: 0 min  Stress: No Stress Concern Present (08/18/2022)   [place]    Feeling of Stress : Not at all  Social Connections: [place] (08/18/2022)   Social Connection and Isolation Panel [NHANES]    Frequency of Communication with Friends and Family: More than three times a week    Frequency of Social Gatherings with Friends and Family: More than three times a week    Attends Religious Services: More than 4 times per year    Active Member of [employer] or Organizations: Yes    Attends Music therapist: More than 4 times per  year    Marital Status: Married    Tobacco Counseling Counseling given: Not Answered   Clinical Intake:  Pre-visit preparation completed: Yes  Pain : 0-10 Pain Score: 5  Pain Type: Acute pain Pain Location: Back Pain Orientation: Right Pain Descriptors / Indicators: Sharp Pain Onset: In the past 7 days     Nutritional Risks: Nausea/ vomitting/ diarrhea (loose stools occasionally for years) Diabetes: No  How often do you need to have someone help you when you read instructions, pamphlets, or other written materials from your  doctor or pharmacy?: 1 - Never  Diabetic? no  Interpreter Needed?: No  Information entered by :: C.Roena Sassaman LPN   Activities of Daily Living    08/18/2022    3:18 PM  In your present state of health, do you have any difficulty performing the following activities:  Hearing? 0  Vision? 0  Difficulty concentrating or making decisions? 0  Walking or climbing stairs? 0  Dressing or bathing? 0  Doing errands, shopping? 0  Preparing Food and eating ? N  Using the Toilet? N  In the past six months, have you accidently leaked urine? Y  Comment occasionally when needs to go badly.  Do you have problems with loss of bowel control? N  Managing your Medications? N  Managing your Finances? N  Housekeeping or managing your Housekeeping? N    Patient Care Team: Ria Bush, MD as PCP - General (Family Medicine) Arta Silence, MD as Consulting Physician (Gastroenterology) Bryson Ha, OD as Consulting Physician (Optometry)  Indicate any recent Medical Services you may have received from other than [facility] providers in the past year (date may be approximate).     Assessment:   This is a routine wellness examination for Karian.  Hearing/Vision screen Hearing Screening - Comments:: No aids Vision Screening - Comments:: none  Dietary issues and exercise activities discussed: Current Exercise Habits: The patient does not participate in  regular exercise at present, Exercise limited by: None identified   Goals Addressed             This Visit's Progress    Patient Stated       Stay Healthy.       Depression Screen    08/18/2022    3:17 PM 08/07/2020   10:37 AM 07/28/2019   11:10 AM 07/21/2018   10:10 AM 07/14/2017    8:10 AM 06/22/2016    8:47 AM  PHQ 2/9 Scores  PHQ - 2 Score 0 0 0 0 0 0  PHQ- 9 Score   0 0 0     Fall Risk    08/18/2022    3:18 PM 08/07/2020   10:36 AM 08/04/2019    8:53 AM 07/28/2019   11:09 AM 07/21/2018   10:10 AM  Fall Risk   Falls in the past year? 0  1 0 0  Number falls in past yr: 0 1 0 0   Injury with Fall? 0 0 1 0   Risk for fall due to : No Fall Risks  History of fall(s) No Fall Risks   Follow up Falls prevention discussed;Falls evaluation completed  Falls prevention discussed Falls evaluation completed;Falls prevention discussed     FALL RISK PREVENTION PERTAINING TO THE HOME:  Any stairs in or around the home? No  If so, are there any without handrails? No  Home free of loose throw rugs in walkways, pet beds, electrical cords, etc? No  Adequate lighting in your home to reduce risk of falls? Yes   ASSISTIVE DEVICES UTILIZED TO PREVENT FALLS:  Life alert? No  Use of a cane, walker or w/c? No  Grab bars in the bathroom? Yes  Shower chair or bench in shower? No  Elevated toilet seat or a handicapped toilet? No    Cognitive Function:    07/28/2019   11:10 AM 07/21/2018   10:10 AM 07/14/2017    8:20 AM 06/22/2016    8:59 AM  MMSE - Mini Mental State Exam  Orientation to time 5 5 5  5  Orientation to Place 5 5 5 5   Registration 3 3 3 3   Attention/ Calculation 5 0 0 0  Recall 3 1 3 3   Recall-comments  unable to recall 2 of 3 words    Language- name 2 objects  0 0 0  Language- repeat 1 1 1 1   Language- follow 3 step command  3 3 3   Language- read & follow direction  0 0 0  Write a sentence  0 0 0  Copy design  0 0 0  Total score  18 20 20         08/18/2022    3:19  PM  6CIT Screen  What Year? 0 points  What month? 0 points  What time? 0 points  Count back from 20 0 points  Months in reverse 4 points  Repeat phrase 4 points  Total Score 8 points    Immunizations Immunization History  Administered Date(s) Administered   Fluad Quad(high Dose 65+) 02/03/2019, 01/30/2022   Influenza Split 01/30/2015   Influenza, High Dose Seasonal PF 02/07/2018, 02/23/2020, 03/14/2021   Influenza,inj,Quad PF,6+ Mos 01/31/2016, 06/24/2017   Influenza-Unspecified 06/05/2015   PFIZER(Purple Top)SARS-COV-2 Vaccination 07/16/2019, 08/09/2019, 02/09/2020   Pneumococcal Conjugate-13 06/05/2015   Pneumococcal Polysaccharide-23 03/28/2012   Tdap 06/23/2012    TDAP status: Due, Education has been provided regarding the importance of this vaccine. Advised may receive this vaccine at local pharmacy or Health Dept. Aware to provide a copy of the vaccination record if obtained from local pharmacy or Health Dept. Verbalized acceptance and understanding.  Flu Vaccine status: Up to date  Pneumococcal vaccine status: Up to date  Covid-19 vaccine status: Declined, Education has been provided regarding the importance of this vaccine but patient still declined. Advised may receive this vaccine at local pharmacy or Health Dept.or vaccine clinic. Aware to provide a copy of the vaccination record if obtained from local pharmacy or Health Dept. Verbalized acceptance and understanding.  Qualifies for Shingles Vaccine? Yes   Zostavax completed No   Shingrix Completed?: No.    Education has been provided regarding the importance of this vaccine. Patient has been advised to call insurance company to determine out of pocket expense if they have not yet received this vaccine. Advised may also receive vaccine at local pharmacy or Health Dept. Verbalized acceptance and understanding.  Screening Tests Health Maintenance  Topic Date Due   Zoster Vaccines- Shingrix (1 of 2) Never done    COLONOSCOPY (Pts 45-1yrs Insurance coverage will need to be confirmed)  08/12/2021   COVID-19 Vaccine (4 - 2023-24 season) 01/30/2022   DTaP/Tdap/Td (2 - Td or Tdap) 06/23/2022   MAMMOGRAM  11/27/2022   Medicare Annual Wellness (AWV)  08/18/2023   Pneumonia Vaccine 53+ Years old  Completed   INFLUENZA VACCINE  Completed   DEXA SCAN  Completed   Hepatitis C Screening  Completed   HPV VACCINES  Aged Out    Health Maintenance  Health Maintenance Due  Topic Date Due   Zoster Vaccines- Shingrix (1 of 2) Never done   COLONOSCOPY (Pts 45-27yrs Insurance coverage will need to be confirmed)  08/12/2021   COVID-19 Vaccine (4 - 2023-24 season) 01/30/2022   DTaP/Tdap/Td (2 - Td or Tdap) 06/23/2022    Colonoscopy - Age out.  Mammogram- Completed 05/29/22.  Pt stated she is having mammograms every 6 months and the next one is scheduled for 11/30/22.  Bone Density status: Completed 09/22/2017 . Results reflect: Bone density results: OSTEOPENIA. Repeat every 2 years.  Lung Cancer Screening: (Low Dose CT Chest recommended if Age 26-80 years, 30 pack-year currently smoking OR have quit w/in 15years.) does not qualify.   Lung Cancer Screening Referral: no  Additional Screening:  Hepatitis C Screening: does not qualify; Completed 06/22/2016  Vision Screening: Recommended annual ophthalmology exams for early detection of glaucoma and other disorders of the eye. Is the patient up to date with their annual eye exam?  Yes  Who is the provider or what is the name of the office in which the patient attends annual eye exams? [facility]. If pt is not established with a provider, would they like to be referred to a provider to establish care? No .   Dental Screening: Recommended annual dental exams for proper oral hygiene  Community Resource Referral / Chronic Care Management: CRR required this visit?  No   CCM required this visit?  No      Plan:     I have personally reviewed and noted  the following in the patient's chart:   Medical and social history Use of alcohol, tobacco or illicit drugs  Current medications and supplements including opioid prescriptions. Patient is not currently taking opioid prescriptions. Functional ability and status Nutritional status Physical activity Advanced directives List of other physicians Hospitalizations, surgeries, and ER visits in previous 12 months Vitals Screenings to include cognitive, depression, and falls Referrals and appointments  In addition, I have reviewed and discussed with patient certain preventive protocols, quality metrics, and best practice recommendations. A written personalized care plan for preventive services as well as general preventive health recommendations were provided to patient.     Lebron Conners, LPN   [id number]   Nurse Notes: Dexa scan order placed.

## 2022-10-01 ENCOUNTER — Other Ambulatory Visit: Payer: Self-pay | Admitting: Family Medicine

## 2022-10-01 DIAGNOSIS — J439 Emphysema, unspecified: Secondary | ICD-10-CM

## 2022-11-05 ENCOUNTER — Other Ambulatory Visit: Payer: Self-pay | Admitting: Family Medicine

## 2022-11-05 DIAGNOSIS — E782 Mixed hyperlipidemia: Secondary | ICD-10-CM

## 2022-11-24 DIAGNOSIS — Z0100 Encounter for examination of eyes and vision without abnormal findings: Secondary | ICD-10-CM | POA: Diagnosis not present

## 2022-11-24 DIAGNOSIS — H524 Presbyopia: Secondary | ICD-10-CM | POA: Diagnosis not present

## 2022-11-30 ENCOUNTER — Other Ambulatory Visit: Payer: Self-pay | Admitting: Family Medicine

## 2022-11-30 ENCOUNTER — Ambulatory Visit
Admission: RE | Admit: 2022-11-30 | Discharge: 2022-11-30 | Disposition: A | Discharge status: Home / Self Care | Payer: No Typology Code available for payment source | Source: Ambulatory Visit | Attending: Family Medicine | Admitting: Family Medicine

## 2022-11-30 DIAGNOSIS — R922 Inconclusive mammogram: Secondary | ICD-10-CM | POA: Diagnosis not present

## 2022-11-30 DIAGNOSIS — R921 Mammographic calcification found on diagnostic imaging of breast: Secondary | ICD-10-CM

## 2022-11-30 DIAGNOSIS — N6489 Other specified disorders of breast: Secondary | ICD-10-CM

## 2022-12-01 ENCOUNTER — Ambulatory Visit
Admission: RE | Admit: 2022-12-01 | Discharge: 2022-12-01 | Disposition: A | Discharge status: Home / Self Care | Payer: No Typology Code available for payment source | Source: Ambulatory Visit | Attending: Family Medicine | Admitting: Family Medicine

## 2022-12-01 ENCOUNTER — Ambulatory Visit
Admission: RE | Admit: 2022-12-01 | Discharge: 2022-12-01 | Discharge status: Home / Self Care | Payer: No Typology Code available for payment source | Source: Ambulatory Visit | Attending: Family Medicine | Admitting: Family Medicine

## 2022-12-01 DIAGNOSIS — N6489 Other specified disorders of breast: Secondary | ICD-10-CM

## 2022-12-01 DIAGNOSIS — R928 Other abnormal and inconclusive findings on diagnostic imaging of breast: Secondary | ICD-10-CM | POA: Diagnosis not present

## 2022-12-01 DIAGNOSIS — Z0389 Encounter for observation for other suspected diseases and conditions ruled out: Secondary | ICD-10-CM | POA: Diagnosis not present

## 2022-12-01 HISTORY — PX: BREAST BIOPSY: SHX20

## 2022-12-02 ENCOUNTER — Other Ambulatory Visit: Payer: Self-pay | Admitting: Family Medicine

## 2022-12-02 DIAGNOSIS — R921 Mammographic calcification found on diagnostic imaging of breast: Secondary | ICD-10-CM

## 2022-12-04 ENCOUNTER — Other Ambulatory Visit: Payer: No Typology Code available for payment source

## 2022-12-09 ENCOUNTER — Encounter: Payer: Self-pay | Admitting: Family Medicine

## 2022-12-09 ENCOUNTER — Ambulatory Visit: Payer: No Typology Code available for payment source | Admitting: Family Medicine

## 2022-12-09 VITALS — BP 136/70 | HR 66 | Temp 97.7°F | Ht 64.5 in | Wt 143.5 lb

## 2022-12-09 DIAGNOSIS — M8588 Other specified disorders of bone density and structure, other site: Secondary | ICD-10-CM | POA: Diagnosis not present

## 2022-12-09 DIAGNOSIS — Z87891 Personal history of nicotine dependence: Secondary | ICD-10-CM | POA: Diagnosis not present

## 2022-12-09 DIAGNOSIS — Z0001 Encounter for general adult medical examination with abnormal findings: Secondary | ICD-10-CM

## 2022-12-09 DIAGNOSIS — E782 Mixed hyperlipidemia: Secondary | ICD-10-CM

## 2022-12-09 DIAGNOSIS — E038 Other specified hypothyroidism: Secondary | ICD-10-CM

## 2022-12-09 DIAGNOSIS — I700 Atherosclerosis of aorta: Secondary | ICD-10-CM | POA: Diagnosis not present

## 2022-12-09 DIAGNOSIS — I2510 Atherosclerotic heart disease of native coronary artery without angina pectoris: Secondary | ICD-10-CM

## 2022-12-09 DIAGNOSIS — Z0000 Encounter for general adult medical examination without abnormal findings: Secondary | ICD-10-CM | POA: Diagnosis not present

## 2022-12-09 DIAGNOSIS — J439 Emphysema, unspecified: Secondary | ICD-10-CM | POA: Diagnosis not present

## 2022-12-09 DIAGNOSIS — R7303 Prediabetes: Secondary | ICD-10-CM | POA: Diagnosis not present

## 2022-12-09 DIAGNOSIS — K529 Noninfective gastroenteritis and colitis, unspecified: Secondary | ICD-10-CM | POA: Diagnosis not present

## 2022-12-09 DIAGNOSIS — N1831 Chronic kidney disease, stage 3a: Secondary | ICD-10-CM

## 2022-12-09 DIAGNOSIS — Z7189 Other specified counseling: Secondary | ICD-10-CM | POA: Diagnosis not present

## 2022-12-09 LAB — CBC WITH DIFFERENTIAL/PLATELET
Basophils Absolute: 0.1 10*3/uL (ref 0.0–0.1)
Basophils Relative: 1 % (ref 0.0–3.0)
Eosinophils Absolute: 0.3 10*3/uL (ref 0.0–0.7)
Eosinophils Relative: 4.6 % (ref 0.0–5.0)
HCT: 41.2 % (ref 36.0–46.0)
Hemoglobin: 13.7 g/dL (ref 12.0–15.0)
Lymphocytes Relative: 21.8 % (ref 12.0–46.0)
Lymphs Abs: 1.3 10*3/uL (ref 0.7–4.0)
MCHC: 33.2 g/dL (ref 30.0–36.0)
MCV: 91.1 fl (ref 78.0–100.0)
Monocytes Absolute: 0.7 10*3/uL (ref 0.1–1.0)
Monocytes Relative: 11 % (ref 3.0–12.0)
Neutro Abs: 3.7 10*3/uL (ref 1.4–7.7)
Neutrophils Relative %: 61.6 % (ref 43.0–77.0)
Platelets: 224 10*3/uL (ref 150.0–400.0)
RBC: 4.53 Mil/uL (ref 3.87–5.11)
RDW: 13 % (ref 11.5–15.5)
WBC: 6.1 10*3/uL (ref 4.0–10.5)

## 2022-12-09 LAB — COMPREHENSIVE METABOLIC PANEL
ALT: 20 U/L (ref 0–35)
AST: 23 U/L (ref 0–37)
Albumin: 3.9 g/dL (ref 3.5–5.2)
Alkaline Phosphatase: 83 U/L (ref 39–117)
BUN: 21 mg/dL (ref 6–23)
CO2: 29 mEq/L (ref 19–32)
Calcium: 9.2 mg/dL (ref 8.4–10.5)
Chloride: 105 mEq/L (ref 96–112)
Creatinine, Ser: 0.96 mg/dL (ref 0.40–1.20)
GFR: 56.88 mL/min — ABNORMAL LOW (ref >60.00)
Glucose, Bld: 100 mg/dL — ABNORMAL HIGH (ref 70–99)
Potassium: 4.5 mEq/L (ref 3.5–5.1)
Sodium: 140 mEq/L (ref 135–145)
Total Bilirubin: 0.9 mg/dL (ref 0.2–1.2)
Total Protein: 6.3 g/dL (ref 6.0–8.3)

## 2022-12-09 LAB — HEMOGLOBIN A1C: Hgb A1c MFr Bld: 6.3 % (ref 4.6–6.5)

## 2022-12-09 LAB — LIPID PANEL
Cholesterol: 155 mg/dL (ref 0–200)
HDL: 59 mg/dL (ref >39.00)
LDL Cholesterol: 66 mg/dL (ref 0–99)
NonHDL: 95.52
Total CHOL/HDL Ratio: 3
Triglycerides: 147 mg/dL (ref 0.0–149.0)
VLDL: 29.4 mg/dL (ref 0.0–40.0)

## 2022-12-09 LAB — VITAMIN D 25 HYDROXY (VIT D DEFICIENCY, FRACTURES): VITD: 41.51 ng/mL (ref 30.00–100.00)

## 2022-12-09 LAB — MICROALBUMIN / CREATININE URINE RATIO
Creatinine,U: 122.9 mg/dL
Microalb Creat Ratio: 0.7 mg/g (ref 0.0–30.0)
Microalb, Ur: 0.9 mg/dL (ref 0.0–1.9)

## 2022-12-09 LAB — SEDIMENTATION RATE: Sed Rate: 11 mm/hr (ref 0–30)

## 2022-12-09 LAB — TSH: TSH: 3.76 u[IU]/mL (ref 0.35–5.50)

## 2022-12-09 LAB — T4, FREE: Free T4: 0.85 ng/dL (ref 0.60–1.60)

## 2022-12-09 LAB — PHOSPHORUS: Phosphorus: 3.4 mg/dL (ref 2.3–4.6)

## 2022-12-09 MED ORDER — ALBUTEROL SULFATE (2.5 MG/3ML) 0.083% IN NEBU: 2.5000 mg | INHALATION_SOLUTION | Freq: Four times a day (QID) | RESPIRATORY_TRACT | 3 refills | Status: DC | PRN

## 2022-12-09 MED ORDER — MONTELUKAST SODIUM 10 MG PO TABS: 10.0000 mg | ORAL_TABLET | Freq: Every day | ORAL | 4 refills | Status: DC

## 2022-12-09 MED ORDER — ATORVASTATIN CALCIUM 20 MG PO TABS: 20.0000 mg | ORAL_TABLET | Freq: Every day | ORAL | 4 refills | Status: DC

## 2022-12-09 NOTE — Assessment & Plan Note (Signed)
Update labs. Continue to limit NSAIDs.

## 2022-12-09 NOTE — Assessment & Plan Note (Signed)
Update A1c ?

## 2022-12-09 NOTE — Assessment & Plan Note (Addendum)
Update TFTs - she has been taking daily biotin

## 2022-12-09 NOTE — Assessment & Plan Note (Signed)
Continue statin. 

## 2022-12-09 NOTE — Progress Notes (Signed)
Ph: [phone] Fax: [phone]   Patient ID: Stacey Durham, female    DOB: Jun 10, 1945, 77 y.o.   MRN: [id number]  This visit was conducted in person.  BP 136/70   Pulse 66   Temp 97.7 F (36.5 C) (Temporal)   Ht 5' 4.5" (1.638 m)   Wt 143 lb 8 oz (65.1 kg)   SpO2 96%   BMI 24.25 kg/m    CC: CPE Subjective:   HPI: Stacey Durham is a 77 y.o. female presenting on 12/09/2022 for Annual Exam (MCR prt 2 [AWV- 08/08/22].)   Saw health advisor 07/2022 for medicare wellness visit. Note reviewed.   No results found.  Flowsheet Row Clinical Support from 08/18/2022 in [facility] [place]  PHQ-2 Total Score 0          08/18/2022    3:18 PM 08/07/2020   10:36 AM 08/04/2019    8:53 AM 07/28/2019   11:09 AM 07/21/2018   10:10 AM  Fall Risk   Falls in the past year? 0  1 0 0  Number falls in past yr: 0 1 0 0   Injury with Fall? 0 0 1 0   Risk for fall due to : No Fall Risks  History of fall(s) No Fall Risks   Follow up Falls prevention discussed;Falls evaluation completed  Falls prevention discussed Falls evaluation completed;Falls prevention discussed      Severe COPD with chronic dyspnea and wheezing, followed by pulm Jayme Cloud) on singulair, trelegy, duonebs PRN.  Pulm nodules resolved on latest evaluation.  Ex smoker - quit 12-26-2001.   Notes worsening imbalance.  She continues regular biotin as well as vitamin b12.   Chronic intermittent diarrhea ongoing for years managed with imodium PRN. Too much of this medicine causes constipation. Can go up to 3 times a day. Peanuts trigger loose stools. No abdominal pain or cramping, blood or mucous in stool. Normal colonoscopy recently. Infrequent bowel incontinence.   Preventative: COLONOSCOPY 07/2016 [facility], ext hem, diverticulosis, rpt 5 yrs (Outlaw) - had another one this year - normal. Records requested  Well woman exam - s/p hysterectomy Dec 26, 1976, part of an ovary remains.  Mammo latest 2021-12-26 s/p  benign biopsy to R breast, rec rpt L breast dx mammo/[facility].  DEXA T score -2.2 spine, -1.2 hip (08/2017). Takes 1 cal/vit D pill daily and MVI. Encourage weight bearing exercise. Lung cancer screening - undergoing screen - last chest CT 05/2022 Flu shot - yearly COVID vaccine Pfizer 07/2019, 07/2019, booster 01/2020  Tdap 12-26-2012 Pneumovax 03/2012, prevnar 06/2015 RSV - discussed Shingrix - discussed, to check with pharmacy  Advanced directive discussion - does not have, but working on this. Has signed advanced directive packet, needs to get notarized. HCPOA - partner Page Spiro. Asked to bring [place] a copy. Full code but wouldn't want prolonged life support.  Seat belt use discussed Sunscreen use discussed. No changing moles on skin. Ex smoker quit 12/26/2001 Alcohol - none  Dentist - doesn't see, has full dentures Eye exam yearly  Bowel - occ loose stools  Bladder - nocturia x2-3, urinary urge incontinence, wears pad regularly. This is not too bothersome. No stress incontinence symptoms.   Originally from [place] - husband passed away 2010/12/27 Lives with partner Jeanmarie Plant: retired, worked at [employer]: enjoys going to [employer]: good water, fruits/vegetables daily     Relevant past medical, surgical, family and social history reviewed and updated as  indicated. Interim medical history since our last visit reviewed. Allergies and medications reviewed and updated. Outpatient Medications Prior to Visit  Medication Sig Dispense Refill   albuterol (VENTOLIN HFA) 108 (90 Base) MCG/ACT inhaler INHALE 2 PUFFS INTO THE LUNGS EVERY 4 HOURS AS NEEDED FOR WHEEZING OR SHORTNESS OF BREATH. 18 each 6   Biotin 16109 MCG TABS Take 10 mg by mouth daily.      calcium-vitamin D (OSCAL WITH D) 500-200 MG-UNIT tablet Take 1 tablet by mouth.     Dextromethorphan-Guaifenesin (MUCINEX DM PO) Take 1 tablet by mouth daily.     loperamide (IMODIUM A-D) 2 MG tablet Take 1 tablet (2 mg total)  by mouth daily as needed for diarrhea or loose stools.  0   Multiple Vitamins-Minerals (MULTIVITAMIN ADULT PO) Take 1 tablet by mouth.     Probiotic Product (PROBIOTIC-10 PO) Take 1 tablet by mouth.     TRELEGY ELLIPTA 100-62.5-25 MCG/ACT AEPB TAKE 1 PUFF BY MOUTH EVERY DAY 180 each 3   vitamin B-12 (CYANOCOBALAMIN) 1000 MCG tablet Take 1,000 mcg by mouth daily.     albuterol (PROVENTIL) (2.5 MG/3ML) 0.083% nebulizer solution Take 3 mLs (2.5 mg total) by nebulization every 6 (six) hours as needed for wheezing or shortness of breath. 75 mL 3   atorvastatin (LIPITOR) 20 MG tablet TAKE 1 TABLET BY MOUTH EVERY DAY 90 tablet 0   montelukast (SINGULAIR) 10 MG tablet TAKE 1 TABLET BY MOUTH EVERY DAY 90 tablet 0   mupirocin cream (BACTROBAN) 2 % Apply 1 application topically 2 (two) times daily. 15 g 0   No facility-administered medications prior to visit.     Per HPI unless specifically indicated in ROS section below Review of Systems  Constitutional:  Negative for activity change, appetite change, chills, fatigue, fever and unexpected weight change.  HENT:  Negative for hearing loss.   Eyes:  Negative for visual disturbance.  Respiratory:  Negative for cough, chest tightness, shortness of breath and wheezing.   Cardiovascular:  Negative for chest pain, palpitations and leg swelling.  Gastrointestinal:  Positive for diarrhea (occ). Negative for abdominal distention, abdominal pain, blood in stool, constipation, nausea and vomiting.  Genitourinary:  Negative for difficulty urinating and hematuria.  Musculoskeletal:  Negative for arthralgias, myalgias and neck pain.  Skin:  Negative for rash.  Neurological:  Negative for dizziness, seizures, syncope and headaches.  Hematological:  Negative for adenopathy. Does not bruise/bleed easily.  Psychiatric/Behavioral:  Negative for dysphoric mood. The patient is not nervous/anxious.     Objective:  BP 136/70   Pulse 66   Temp 97.7 F (36.5 C)  (Temporal)   Ht 5' 4.5" (1.638 m)   Wt 143 lb 8 oz (65.1 kg)   SpO2 96%   BMI 24.25 kg/m   Wt Readings from Last 3 Encounters:  12/09/22 143 lb 8 oz (65.1 kg)  08/18/22 140 lb (63.5 kg)  07/29/22 143 lb 12.8 oz (65.2 kg)      Physical Exam Vitals and nursing note reviewed.  Constitutional:      Appearance: Normal appearance. She is not ill-appearing.  HENT:     Head: Normocephalic and atraumatic.     Right Ear: Tympanic membrane, ear canal and external ear normal. There is no impacted cerumen.     Left Ear: Tympanic membrane, ear canal and external ear normal. There is no impacted cerumen.     Mouth/Throat:     Mouth: Mucous membranes are moist.     Pharynx: Oropharynx is  clear. No oropharyngeal exudate or posterior oropharyngeal erythema.  Eyes:     General:        Right eye: No discharge.        Left eye: No discharge.     Extraocular Movements: Extraocular movements intact.     Conjunctiva/sclera: Conjunctivae normal.     Pupils: Pupils are equal, round, and reactive to light.  Neck:     Thyroid: No thyroid mass or thyromegaly.     Vascular: No carotid bruit.  Cardiovascular:     Rate and Rhythm: Normal rate and regular rhythm.     Pulses: Normal pulses.     Heart sounds: Normal heart sounds. No murmur heard. Pulmonary:     Effort: Pulmonary effort is normal. No respiratory distress.     Breath sounds: No wheezing, rhonchi or rales.     Comments: Bibasilar crackles  Abdominal:     General: Bowel sounds are normal. There is no distension.     Palpations: Abdomen is soft. There is no mass.     Tenderness: There is no abdominal tenderness. There is no guarding or rebound.     Hernia: No hernia is present.  Musculoskeletal:     Cervical back: Normal range of motion and neck supple. No rigidity.     Right lower leg: No edema.     Left lower leg: No edema.  Lymphadenopathy:     Cervical: No cervical adenopathy.  Skin:    General: Skin is warm and dry.     Findings:  No rash.  Neurological:     General: No focal deficit present.     Mental Status: She is alert. Mental status is at baseline.  Psychiatric:        Mood and Affect: Mood normal.        Behavior: Behavior normal.        Assessment & Plan:   Problem List Items Addressed This Visit     Encounter for general adult medical examination with abnormal findings - Primary (Chronic)    Preventative protocols reviewed and updated unless pt declined. Discussed healthy diet and lifestyle.  GI records requested for latest colonoscopy 2024      Advanced care planning/counseling discussion (Chronic)    Advanced directive discussion - does not have, but working on this. Has signed advanced directive packet, needs to get notarized. HCPOA - partner Page Spiro. Asked to bring [place] a copy. Full code but wouldn't want prolonged life support.       Ex-smoker    Remains abstinent.       Chronic obstructive pulmonary disease (HCC)    Sees pulm Dr Jayme Cloud regularly on controller Trelegy, singulair, albuterol PRN.       Relevant Medications   montelukast (SINGULAIR) 10 MG tablet   albuterol (PROVENTIL) (2.5 MG/3ML) 0.083% nebulizer solution   Subclinical hypothyroidism    Update TFTs - she has been taking daily biotin       Relevant Orders   TSH   T4, free   T3   Aortic atherosclerosis (HCC)    Continue atorvastatin.       Relevant Medications   atorvastatin (LIPITOR) 20 MG tablet   CAD (coronary artery disease)    Continue statin.       Relevant Medications   atorvastatin (LIPITOR) 20 MG tablet   HLD (hyperlipidemia)    Update labs on daily atorvastatin 20mg  The 10-year ASCVD risk score (Arnett DK, et al., 2019) is: 22%   Values used  to calculate the score:     Age: 43 years     Sex: Female     Is Non-Hispanic African American: No     Diabetic: No     Tobacco smoker: No     Systolic Blood Pressure: 136 mmHg     Is BP treated: No     HDL Cholesterol: 59.9 mg/dL     Total  Cholesterol: 137 mg/dL       Relevant Medications   atorvastatin (LIPITOR) 20 MG tablet   Other Relevant Orders   Lipid panel   Comprehensive metabolic panel   Prediabetes    Update A1c.      Relevant Orders   Hemoglobin A1c   Osteopenia    # provided to call and schedule updated DEXA scan.       Chronic diarrhea    Ongoing intermittent diarrhea, not consistent with infectious cause, states had reassuring colonoscopy. ?diet related - she will monitor triggering foods more closely. Check labs today, discussed possible return to GI for further evaluation.       Relevant Orders   CBC with Differential/Platelet   Sedimentation rate   CKD (chronic kidney disease) stage 3, GFR 30-59 ml/min (HCC)    Update labs. Continue to limit NSAIDs.       Relevant Orders   Comprehensive metabolic panel   Phosphorus   VITAMIN D 25 Hydroxy (Vit-D Deficiency, Fractures)   Microalbumin / creatinine urine ratio   Parathyroid hormone, intact (no Ca)   CBC with Differential/Platelet     Meds ordered this encounter  Medications   atorvastatin (LIPITOR) 20 MG tablet    Sig: Take 1 tablet (20 mg total) by mouth daily.    Dispense:  90 tablet    Refill:  4   montelukast (SINGULAIR) 10 MG tablet    Sig: Take 1 tablet (10 mg total) by mouth daily.    Dispense:  90 tablet    Refill:  4   albuterol (PROVENTIL) (2.5 MG/3ML) 0.083% nebulizer solution    Sig: Take 3 mLs (2.5 mg total) by nebulization every 6 (six) hours as needed for wheezing or shortness of breath.    Dispense:  75 mL    Refill:  3    Orders Placed This Encounter  Procedures   Lipid panel   Comprehensive metabolic panel   Phosphorus   VITAMIN D 25 Hydroxy (Vit-D Deficiency, Fractures)   Microalbumin / creatinine urine ratio   Parathyroid hormone, intact (no Ca)   CBC with Differential/Platelet   TSH   T4, free   T3   Hemoglobin A1c   Sedimentation rate    Patient Instructions  Labwork today  We will request  colonoscopy from [facility] (Outlaw) from this year.  Call to schedule bone density scan at your convenience: [facility] [phone] If interested, check with pharmacy about new 2 shot shingles series (shingrix) shot.  Ask pharmacy for date of RSV shot and let us know to update your chart.  Work on Scientist, water quality. Packet provided today.  Return in 1 year for next physical/wellness visit   Follow up plan: Return in about 1 year (around 12/09/2023) for annual exam, prior fasting for blood work, medicare wellness visit.  Eustaquio Boyden, MD

## 2022-12-09 NOTE — Assessment & Plan Note (Signed)
#   provided to call and schedule updated DEXA scan.

## 2022-12-09 NOTE — Assessment & Plan Note (Addendum)
Advanced directive discussion - does not have, but working on this. Has signed advanced directive packet, needs to get notarized. HCPOA - partner Page Spiro. Asked to bring [place] a copy. Full code but wouldn't want prolonged life support.

## 2022-12-09 NOTE — Assessment & Plan Note (Signed)
Ongoing intermittent diarrhea, not consistent with infectious cause, states had reassuring colonoscopy. ?diet related - she will monitor triggering foods more closely. Check labs today, discussed possible return to GI for further evaluation.

## 2022-12-09 NOTE — Assessment & Plan Note (Signed)
Continue atorvastatin

## 2022-12-09 NOTE — Assessment & Plan Note (Addendum)
Preventative protocols reviewed and updated unless pt declined. Discussed healthy diet and lifestyle.  GI records requested for latest colonoscopy 2024

## 2022-12-09 NOTE — Assessment & Plan Note (Signed)
Sees pulm Dr Jayme Cloud regularly on controller Trelegy, singulair, albuterol PRN.

## 2022-12-09 NOTE — Assessment & Plan Note (Signed)
Update labs on daily atorvastatin 20mg  The 10-year ASCVD risk score (Arnett DK, et al., 2019) is: 22%   Values used to calculate the score:     Age: 77 years     Sex: Female     Is Non-Hispanic African American: No     Diabetic: No     Tobacco smoker: No     Systolic Blood Pressure: 136 mmHg     Is BP treated: No     HDL Cholesterol: 59.9 mg/dL     Total Cholesterol: 137 mg/dL

## 2022-12-09 NOTE — Patient Instructions (Addendum)
Labwork today  We will request colonoscopy from [facility] ([facility]) from this year.  Call to schedule bone density scan at your convenience: [facility] [phone] If interested, check with pharmacy about new 2 shot shingles series (shingrix) shot.  Ask pharmacy for date of RSV shot and let us know to update your chart.  Work on Scientist, water quality. Packet provided today.  Return in 1 year for next physical/wellness visit

## 2022-12-09 NOTE — Assessment & Plan Note (Signed)
Remains abstinent 

## 2022-12-10 LAB — PARATHYROID HORMONE, INTACT (NO CA): PTH: 29 pg/mL (ref 16–77)

## 2022-12-10 LAB — T3: T3, Total: 91 ng/dL (ref 76–181)

## 2022-12-24 ENCOUNTER — Encounter: Payer: Self-pay | Admitting: Family Medicine

## 2023-01-20 ENCOUNTER — Ambulatory Visit (INDEPENDENT_AMBULATORY_CARE_PROVIDER_SITE_OTHER): Payer: No Typology Code available for payment source | Admitting: Pulmonary Disease

## 2023-01-20 ENCOUNTER — Encounter: Payer: Self-pay | Admitting: Pulmonary Disease

## 2023-01-20 VITALS — BP 110/70 | HR 91 | Temp 97.5°F | Ht 64.5 in | Wt 145.0 lb

## 2023-01-20 DIAGNOSIS — J449 Chronic obstructive pulmonary disease, unspecified: Secondary | ICD-10-CM

## 2023-01-20 DIAGNOSIS — Z87891 Personal history of nicotine dependence: Secondary | ICD-10-CM | POA: Diagnosis not present

## 2023-01-20 NOTE — Patient Instructions (Signed)
Your lungs sounded very clear today.  This is good news.  Continue your Trelegy Ellipta.  Will see you in follow-up in 6 months time call sooner should any new problems arise.

## 2023-01-20 NOTE — Progress Notes (Signed)
Subjective:    Patient ID: Stacey Durham, female    DOB: Dec 18, 1945, 77 y.o.   MRN: [id number]  Patient Care Team: Eustaquio Boyden, MD as PCP - General (Family Medicine) Willis Modena, MD as Consulting Physician (Gastroenterology) Carrie Mew, OD as Consulting Physician [facility])  Chief Complaint  Patient presents with   Follow-up    DOE. No wheezing or cough.     HPI Patient is a 77 year old former smoker (quit 2003, 50 PY) who presents for follow-up on the issue of COPD.  I last saw her on June 2023, that was her first visit with me.  She was previously following with Dr. [name]. Of note the patient had had a CT chest on 29 July 2022 at that time she had had follow-up CT for lung nodules that have been noted previously these had completely resolved there were likely infectious/inflammatory. There is mild scarring and subsegmental atelectasis of both lower lobes and evidence of emphysema.  There was also a calcified granuloma in the left upper lobe, benign.  She had PFTs obtained on 21 May 2022 this shows that she has moderate COPD with an FEV1 of 1.19 L or 55% predicted, FVC of 213 4 L or 81% predicted and FEV1/FVC 51%.  There was no bronchodilator response.  The patient could not perform lung volumes due to claustrophobia and inability to get into the body box.  Diffusion capacity is moderately reduced.  Overall however the study was markedly improved from her prior study of 2017.   Patient presents today without any new issues.  No fevers, chills or sweats.  No cough or sputum production.  No hemoptysis.  No weight loss or anorexia.  She is compliant with Trelegy and as needed albuterol.  She notes that this regimen is working well for her.  She uses albuterol rarely.  Her dyspnea is stable and at baseline. She has not relapsed and smoking. Overall she feels well and looks well.     Review of Systems A 10 point review of systems was performed and it is as  noted above otherwise negative.   Patient Active Problem List   Diagnosis Date Noted   Skin lesion of cheek 08/12/2021   Medicare annual wellness visit, subsequent 08/07/2020   Urge incontinence of urine 08/07/2020   Grade III diastolic dysfunction 12/16/2019   CKD (chronic kidney disease) stage 3, GFR 30-59 ml/min (HCC) 07/28/2018   Chronic diarrhea 05/10/2018   Chronic respiratory failure with hypoxia (HCC) 01/29/2018   Osteopenia 09/25/2017   Pulmonary nodule 02/02/2017   Neck pain 01/30/2017   Encounter for general adult medical examination with abnormal findings 06/26/2016   Advanced care planning/counseling discussion 06/26/2016   HLD (hyperlipidemia) 06/26/2016   Prediabetes 06/26/2016   Hair loss 01/07/2016   Subclinical hypothyroidism 01/07/2016   Aortic atherosclerosis (HCC) 01/07/2016   CAD (coronary artery disease) 01/07/2016   COPD exacerbation (HCC) 11/26/2015   Ex-smoker 09/25/2015   Chronic obstructive pulmonary disease (HCC) 08/16/2015    Social History   Tobacco Use   Smoking status: Former    Current packs/day: 0.00    Average packs/day: 1.3 packs/day for 40.0 years (50.0 ttl pk-yrs)    Types: Cigarettes    Start date: 1964    Quit date: 2003    Years since quitting: 21.6   Smokeless tobacco: Never  Substance Use Topics   Alcohol use: No    Allergies  Allergen Reactions   Hydrocodone-Acetaminophen Other (See Comments)   Tramadol  Other (See Comments)    Lightheaded, imbalance    Current Meds  Medication Sig   albuterol (PROVENTIL) (2.5 MG/3ML) 0.083% nebulizer solution Take 3 mLs (2.5 mg total) by nebulization every 6 (six) hours as needed for wheezing or shortness of breath.   albuterol (VENTOLIN HFA) 108 (90 Base) MCG/ACT inhaler INHALE 2 PUFFS INTO THE LUNGS EVERY 4 HOURS AS NEEDED FOR WHEEZING OR SHORTNESS OF BREATH.   atorvastatin (LIPITOR) 20 MG tablet Take 1 tablet (20 mg total) by mouth daily.   Biotin 40981 MCG TABS Take 10 mg by mouth  daily.    calcium-vitamin D (OSCAL WITH D) 500-200 MG-UNIT tablet Take 1 tablet by mouth.   Dextromethorphan-Guaifenesin (MUCINEX DM PO) Take 1 tablet by mouth daily.   loperamide (IMODIUM A-D) 2 MG tablet Take 1 tablet (2 mg total) by mouth daily as needed for diarrhea or loose stools.   montelukast (SINGULAIR) 10 MG tablet Take 1 tablet (10 mg total) by mouth daily.   Multiple Vitamins-Minerals (MULTIVITAMIN ADULT PO) Take 1 tablet by mouth.   Probiotic Product (PROBIOTIC-10 PO) Take 1 tablet by mouth.   TRELEGY ELLIPTA 100-62.5-25 MCG/ACT AEPB TAKE 1 PUFF BY MOUTH EVERY DAY   vitamin B-12 (CYANOCOBALAMIN) 1000 MCG tablet Take 1,000 mcg by mouth daily.    Immunization History  Administered Date(s) Administered   Fluad Quad(high Dose 65+) 02/03/2019, 01/30/2022   Influenza Split 01/30/2015   Influenza, High Dose Seasonal PF 02/07/2018, 02/23/2020, 03/14/2021   Influenza,inj,Quad PF,6+ Mos 01/31/2016, 06/24/2017   Influenza-Unspecified 06/05/2015   PFIZER(Purple Top)SARS-COV-2 Vaccination 07/16/2019, 08/09/2019, 02/09/2020   Pneumococcal Conjugate-13 06/05/2015   Pneumococcal Polysaccharide-23 03/28/2012   Tdap 06/23/2012        Objective:   BP 110/70 (BP Location: Left Arm, Cuff Size: Normal)   Pulse 91   Temp (!) 97.5 F (36.4 C)   Ht 5' 4.5" (1.638 m)   Wt 145 lb (65.8 kg)   SpO2 95%   BMI 24.50 kg/m   SpO2: 95 % O2 Device: None (Room air)  GENERAL: Well-developed, well-nourished woman, impeccably groomed, no acute distress.  Fully ambulatory, no conversational dyspnea. HEAD: Normocephalic, atraumatic.  EYES: Pupils equal, round, reactive to light.  No scleral icterus.  MOUTH: Dentures both.  Oral mucosa moist. NECK: Supple. No thyromegaly. Trachea midline. No JVD.  No adenopathy. PULMONARY: Good air entry bilaterally.  Coarse otherwise, no adventitious sounds. CARDIOVASCULAR: S1 and S2. Regular rate and rhythm.  No rubs, murmurs or gallops heard. ABDOMEN:  Benign. MUSCULOSKELETAL: No joint deformity, no clubbing, no edema.  NEUROLOGIC: No overt focal deficit, no gait disturbance, speech is fluent. SKIN: Intact,warm,dry. PSYCH: Mood and behavior normal      Assessment & Plan:     ICD-10-CM   1. Stage 2 moderate COPD by GOLD classification (HCC)  J44.9    Patient well compensated Continue Trelegy Ellipta Continue as needed albuterol    2. Former smoker  Z87.891    No evidence of relapse     Patient appears to be well compensated.  Will see her in follow-up in 6 months time she is to contact us prior to that time should any new difficulties arise.  Gailen Shelter, MD Advanced Bronchoscopy PCCM [facility]    *This note was dictated using voice recognition software/Dragon.  Despite best efforts to proofread, errors can occur which can change the meaning. Any transcriptional errors that result from this process are unintentional and may not be fully corrected at the time of dictation.

## 2023-01-23 ENCOUNTER — Encounter: Payer: Self-pay | Admitting: Pulmonary Disease

## 2023-03-29 DIAGNOSIS — Z008 Encounter for other general examination: Secondary | ICD-10-CM | POA: Diagnosis not present

## 2023-03-29 DIAGNOSIS — Z6824 Body mass index (BMI) 24.0-24.9, adult: Secondary | ICD-10-CM | POA: Diagnosis not present

## 2023-03-29 DIAGNOSIS — E785 Hyperlipidemia, unspecified: Secondary | ICD-10-CM | POA: Diagnosis not present

## 2023-05-08 ENCOUNTER — Other Ambulatory Visit: Payer: Self-pay | Admitting: Internal Medicine

## 2023-05-31 ENCOUNTER — Ambulatory Visit: Payer: Self-pay | Admitting: Family Medicine

## 2023-05-31 NOTE — Telephone Encounter (Addendum)
Agree with recommended ER eval. As she refused, we will see in office tomorrow.  No known h/o aortic aneurysm

## 2023-05-31 NOTE — Telephone Encounter (Signed)
I spoke with pt; starting on 12/27/245 pt has severe back pain that is very uncomfortable that moves from under shouldblades down to the lower back; pt has been doing alotof sewing and pt is not sure if that is bothering her back or it is something else but the back pain is constant.  Starting on 05/30/23 pt started with heaviness in chest that is constant; no CP but pt does have SOB also. Pt said the breathing is not bothering her that much; pt has a grunting  sand SOB while talking on phone.  Pt has prod cough with white phlegm that is better since taking nebulizer treatment this morning. Pulse ox at home is 95%. Pt has no wheezing and pt does not think she is running fever. No appts at [facility]; offered pt appt at other facility but pt only wants to see dr Reece Agar. UC & ED precautions given and pt voiced understanding., pt already has appt with Dr Reece Agar on 06/01/23 at 8:30. Sending note to Dr Reece Agar and G pool and will speak with [facility] CMA.

## 2023-05-31 NOTE — Telephone Encounter (Signed)
Copied from CRM [id number]. Topic: Clinical - Red Word Triage >> May 31, 2023  8:13 AM Stacey Durham wrote: Red Word that prompted transfer to Nurse Triage: Patient called with complaints of severe back pain and shortness of breath.   Chief Complaint: SOB, back pain Symptoms: SOB, back pain, chest "pressure" Frequency: Constant Pertinent Negatives: Patient denies current cold symptoms, fever Disposition: [x] ED /[] Urgent Care (no appt availability in office) / [] Appointment(In office/virtual)/ []  [facility]/ [] Home Care/ [x] Refused Recommended Disposition /[] [facility]/ []  Follow-up with PCP Additional Notes: Agent reporting that they scheduled pt for appt tomorrow for symptoms, reporting SOB needing triage. Pt reporting that she is "coughing but not a lot," "don't feel like I'm wheezing," pt reporting hx of COPD, pt reporting that her chest feels "real heavy." Pt reporting she is "about to use my nebulizer." Pt reporting that the back pain started on Friday, "breathing wasn't too bad until yesterday." Pt reporting that she "had a cold to start with but pretty well broke that up" Pt reporting that SOB is "not constant, don't know what to say."  Pt reporting that she is "just short of breath" all over. Pt is having to audibly grunt/sigh with each breath over phone call. Pt reporting she's not had this level of difficulty breathing before except for "when had pneumonia real bad." Pt denies cardiac history. Pt reporting that this exacerbation is likely from recent illness, "probably from the cold, should have gotten something to take care of the cold and I didn't." Pt reporting not chest pain but "pressure."Pt reporting she does have a pulse ox at home, cannot find at the moment, unable to assess at this time. Advised pt be examined in ED for her symptoms. Pt refused, reporting "took me long enough to call" to schedule for her symptoms, confirms refusing to go to ED, plans to come to appt  tomorrow. Advised pt utilize her nebulizer as she stated she would use and her as-needed medications to help her breathing in the meantime, advised go to ED or call EMS if any worsening. Pt verbalized understanding.  Reason for Disposition  [1] MODERATE difficulty breathing (e.g., speaks in phrases, SOB even at rest, pulse 100-120) AND [2] NEW-onset or WORSE than normal  Answer Assessment - Initial Assessment Questions 1. RESPIRATORY STATUS: "Describe your breathing?" (e.g., wheezing, shortness of breath, unable to speak, severe coughing)      Pt reporting that she is "coughing but not a lot," "don't feel like I'm wheezing," pt reporting hx of COPD, pt reporting that her chest feels "real heavy." 2. ONSET: "When did this breathing problem begin?"      Pt reporting that the back pain started on Friday, "breathing wasn't too bad until yesterday." Pt reporting that she "had a cold to start with but pretty well broke that up"  3. PATTERN "Does the difficult breathing come and go, or has it been constant since it started?"      Pt reporting that SOB is "not constant, don't know what to say." 4. SEVERITY: "How bad is your breathing?" (e.g., mild, moderate, severe)    - MILD: No SOB at rest, mild SOB with walking, speaks normally in sentences, can lie down, no retractions, pulse < 100.    - MODERATE: SOB at rest, SOB with minimal exertion and prefers to sit, cannot lie down flat, speaks in phrases, mild retractions, audible wheezing, pulse 100-120.    - SEVERE: Very SOB at rest, speaks in single words,  struggling to breathe, sitting hunched forward, retractions, pulse > 120      Pt reporting that she is "just short of breath" all over. Pt is having to grunt/sigh with each breath. 5. RECURRENT SYMPTOM: "Have you had difficulty breathing before?" If Yes, ask: "When was the last time?" and "What happened that time?"      Pt reporting she's not had this level of difficulty breathing before except for "when  had pneumonia real bad." 6. CARDIAC HISTORY: "Do you have any history of heart disease?" (e.g., heart attack, angina, bypass surgery, angioplasty)      Pt denies cardiac history. 7. LUNG HISTORY: "Do you have any history of lung disease?"  (e.g., pulmonary embolus, asthma, emphysema)     Pt reporting hx of COPD.  8. CAUSE: "What do you think is causing the breathing problem?"      Pt reporting that this exacerbation is "probably from the cold, should have gotten something to take care of the cold and I didn't." 9. OTHER SYMPTOMS: "Do you have any other symptoms? (e.g., dizziness, runny nose, cough, chest pain, fever)     Pt reporting not chest pain but "pressure." 10. O2 SATURATION MONITOR:  "Do you use an oxygen saturation monitor (pulse oximeter) at home?" If Yes, ask: "What is your reading (oxygen level) today?" "What is your usual oxygen saturation reading?" (e.g., 95%)       Pt reporting she does have a pulse ox at home, cannot find at the moment.  Protocols used: Breathing Difficulty-A-AH

## 2023-06-01 ENCOUNTER — Encounter: Payer: Self-pay | Admitting: Family Medicine

## 2023-06-01 ENCOUNTER — Ambulatory Visit
Admission: RE | Admit: 2023-06-01 | Discharge: 2023-06-01 | Disposition: A | Discharge status: Home / Self Care | Payer: No Typology Code available for payment source | Source: Ambulatory Visit | Attending: Family Medicine | Admitting: Family Medicine

## 2023-06-01 ENCOUNTER — Ambulatory Visit (INDEPENDENT_AMBULATORY_CARE_PROVIDER_SITE_OTHER): Payer: No Typology Code available for payment source | Admitting: Family Medicine

## 2023-06-01 VITALS — BP 126/80 | HR 69 | Temp 97.8°F | Ht 64.5 in | Wt 141.4 lb

## 2023-06-01 DIAGNOSIS — R059 Cough, unspecified: Secondary | ICD-10-CM | POA: Diagnosis not present

## 2023-06-01 DIAGNOSIS — R051 Acute cough: Secondary | ICD-10-CM

## 2023-06-01 DIAGNOSIS — M549 Dorsalgia, unspecified: Secondary | ICD-10-CM | POA: Diagnosis not present

## 2023-06-01 DIAGNOSIS — J439 Emphysema, unspecified: Secondary | ICD-10-CM

## 2023-06-01 DIAGNOSIS — M546 Pain in thoracic spine: Secondary | ICD-10-CM | POA: Diagnosis not present

## 2023-06-01 DIAGNOSIS — U071 COVID-19: Secondary | ICD-10-CM

## 2023-06-01 LAB — POC COVID19 BINAXNOW: SARS Coronavirus 2 Ag: POSITIVE — AB

## 2023-06-01 NOTE — Assessment & Plan Note (Addendum)
Sees pulm, continues Trelegy, singulair, PRN albuterol nebulizer with benefit.

## 2023-06-01 NOTE — Assessment & Plan Note (Addendum)
 COVID swab today faintly positive.  Discussed with patient, as well as supportive measures. Rec ibuprofen  400mg  BID PRN back pain as per below.  Outside of window for anti-viral.  Update if not improving as expected.  Check CXR in COPD history and new thoracic back pain.
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
## 2023-06-01 NOTE — Assessment & Plan Note (Signed)
Anticipate thoracic strain after working long period of time on sewing machine recently given ibuprofen has significantly helped.  Continue this along with ice/heating pad.

## 2023-06-01 NOTE — Patient Instructions (Addendum)
 COVID swab today - returned positive  Chest xray today  Ok to continue ibuprofen  400mg  twice daily to three times daily with meals for next 3-5 days. Heating pad to the back - covered in a towel for 15-52min at a time for next few days.  Let us  know if not improving as expected to consider steroid or antibiotic course.  Continue albuterol  nebulizer as needed, and daily Trelegy inhaler.

## 2023-06-07 ENCOUNTER — Other Ambulatory Visit: Payer: No Typology Code available for payment source

## 2023-06-14 ENCOUNTER — Other Ambulatory Visit: Payer: Self-pay | Admitting: Family Medicine

## 2023-06-14 ENCOUNTER — Ambulatory Visit
Admission: RE | Admit: 2023-06-14 | Discharge: 2023-06-14 | Disposition: A | Discharge status: Home / Self Care | Payer: No Typology Code available for payment source | Source: Ambulatory Visit | Attending: Family Medicine | Admitting: Family Medicine

## 2023-06-14 ENCOUNTER — Inpatient Hospital Stay: Admission: RE | Admit: 2023-06-14 | Payer: No Typology Code available for payment source | Source: Ambulatory Visit

## 2023-06-14 ENCOUNTER — Ambulatory Visit: Payer: No Typology Code available for payment source

## 2023-06-14 DIAGNOSIS — Z90722 Acquired absence of ovaries, bilateral: Secondary | ICD-10-CM | POA: Diagnosis not present

## 2023-06-14 DIAGNOSIS — Z780 Asymptomatic menopausal state: Secondary | ICD-10-CM

## 2023-06-14 DIAGNOSIS — E2839 Other primary ovarian failure: Secondary | ICD-10-CM | POA: Diagnosis not present

## 2023-06-14 DIAGNOSIS — R921 Mammographic calcification found on diagnostic imaging of breast: Secondary | ICD-10-CM | POA: Diagnosis not present

## 2023-06-14 DIAGNOSIS — N958 Other specified menopausal and perimenopausal disorders: Secondary | ICD-10-CM | POA: Diagnosis not present

## 2023-06-14 DIAGNOSIS — M8588 Other specified disorders of bone density and structure, other site: Secondary | ICD-10-CM | POA: Diagnosis not present

## 2023-07-21 ENCOUNTER — Ambulatory Visit: Payer: No Typology Code available for payment source | Admitting: Pulmonary Disease

## 2023-07-23 ENCOUNTER — Encounter: Payer: Self-pay | Admitting: Pulmonary Disease

## 2023-07-23 ENCOUNTER — Ambulatory Visit: Payer: No Typology Code available for payment source | Admitting: Pulmonary Disease

## 2023-07-23 VITALS — BP 118/76 | HR 70 | Temp 96.9°F | Ht 64.5 in | Wt 138.2 lb

## 2023-07-23 DIAGNOSIS — R0600 Dyspnea, unspecified: Secondary | ICD-10-CM

## 2023-07-23 DIAGNOSIS — Z87891 Personal history of nicotine dependence: Secondary | ICD-10-CM | POA: Diagnosis not present

## 2023-07-23 DIAGNOSIS — J449 Chronic obstructive pulmonary disease, unspecified: Secondary | ICD-10-CM | POA: Diagnosis not present

## 2023-07-23 NOTE — Progress Notes (Signed)
 Subjective:    Patient ID: Stacey Durham, female    DOB: 04-29-1946, 78 y.o.   MRN: [id number]  Patient Care Team: Eustaquio Boyden, MD as PCP - General (Family Medicine) Willis Modena, MD as Consulting Physician (Gastroenterology) Carrie Mew, OD as Consulting Physician [facility])  Chief Complaint  Patient presents with   Follow-up    DOE. No wheezing or cough.     BACKGROUND/INTERVAL:Patient is a 78 year old former smoker (quit 2003, 50 PY) who presents for follow-up on the issue of COPD.  I last saw her on 20 January 2023.   HPI Discussed the use of AI scribe software for clinical note transcription with the patient, who gave verbal consent to proceed.  History of Present Illness   Stacey Durham is a 78 year old female with stage two COPD who presents with worsening breathing symptoms, namely dyspnea. She is accompanied by [name], her significant other.  She experiences worsening breathing symptoms, particularly in cold weather, which she describes as 'air is thick'.  She notes that usually she does not have this difficulty in warmer weather.  We discussed strategies to help like wearing a scarf over nose and mouth to keep warm around her upper airway.  She has not tried using the albuterol inhaler prior to going out. She continues to use Trelegy as prescribed and finds it effective.   She does not endorse any fevers, chills or sweats.  No cough or sputum production.  No hemoptysis.  No weight loss or anorexia. She has not relapsed in smoking.  She is compliant with nocturnal oxygen and notes benefit of the therapy.       DATA 10/04/2015 PFTs: FEV1 1.10 L or 46% predicted, FVC 2.10 L or 67% predicted, FEV1/FVC 52%, no bronchodilator response, lung volumes normal by nitrogen washout.  Diffusion capacity mildly reduced. 12/03/2021 echocardiogram: FEV1 55 to 60% grade 1 DD, no wall motion abnormalities, RV function normal.  No valvular abnormalities. 05/21/2022 PFTs:  FEV1 1.19 L or 55% predicted, FVC 2.34 L or 81% predicted, FEV1/FVC 51%, no bronchodilator response.  Lung volumes were not performed due to patient's inability to perform the test.  Diffusion capacity moderately reduced.  Consistent with severe obstructive airways disease.   Review of Systems A 10 point review of systems was performed and it is as noted above otherwise negative.   Patient Active Problem List   Diagnosis Date Noted   Acute right-sided thoracic back pain 06/01/2023   Skin lesion of cheek 08/12/2021   Medicare annual wellness visit, subsequent 08/07/2020   Urge incontinence of urine 08/07/2020   Grade III diastolic dysfunction 12/16/2019   CKD (chronic kidney disease) stage 3, GFR 30-59 ml/min (HCC) 07/28/2018   Chronic diarrhea 05/10/2018   Chronic respiratory failure with hypoxia (HCC) 01/29/2018   Osteopenia 09/25/2017   Pulmonary nodule 02/02/2017   Neck pain 01/30/2017   Encounter for general adult medical examination with abnormal findings 06/26/2016   Advanced care planning/counseling discussion 06/26/2016   HLD (hyperlipidemia) 06/26/2016   Prediabetes 06/26/2016   Hair loss 01/07/2016   Subclinical hypothyroidism 01/07/2016   Aortic atherosclerosis (HCC) 01/07/2016   CAD (coronary artery disease) 01/07/2016   COVID-19 virus infection 11/26/2015   COPD exacerbation (HCC) 11/26/2015   Ex-smoker 09/25/2015   Chronic obstructive pulmonary disease (HCC) 08/16/2015    Social History   Tobacco Use   Smoking status: Former    Current packs/day: 0.00    Average packs/day: 1.3 packs/day for 40.0 years (50.0 ttl  pk-yrs)    Types: Cigarettes    Start date: 1964    Quit date: 2003    Years since quitting: 22.1   Smokeless tobacco: Never  Substance Use Topics   Alcohol use: No    Allergies  Allergen Reactions   Hydrocodone-Acetaminophen Other (See Comments)   Tramadol Other (See Comments)    Lightheaded, imbalance    Current Meds  Medication Sig    albuterol (PROVENTIL) (2.5 MG/3ML) 0.083% nebulizer solution Take 3 mLs (2.5 mg total) by nebulization every 6 (six) hours as needed for wheezing or shortness of breath.   albuterol (VENTOLIN HFA) 108 (90 Base) MCG/ACT inhaler INHALE 2 PUFFS INTO THE LUNGS EVERY 4 HOURS AS NEEDED FOR WHEEZING OR SHORTNESS OF BREATH.   atorvastatin (LIPITOR) 20 MG tablet Take 1 tablet (20 mg total) by mouth daily.   Biotin 60454 MCG TABS Take 10 mg by mouth daily.    calcium-vitamin D (OSCAL WITH D) 500-200 MG-UNIT tablet Take 1 tablet by mouth.   Dextromethorphan-Guaifenesin (MUCINEX DM PO) Take 1 tablet by mouth daily.   montelukast (SINGULAIR) 10 MG tablet Take 1 tablet (10 mg total) by mouth daily.   Multiple Vitamins-Minerals (MULTIVITAMIN ADULT PO) Take 1 tablet by mouth.   Probiotic Product (PROBIOTIC-10 PO) Take 1 tablet by mouth.   TRELEGY ELLIPTA 100-62.5-25 MCG/ACT AEPB INHALE 1 PUFF BY MOUTH EVERY DAY   vitamin B-12 (CYANOCOBALAMIN) 1000 MCG tablet Take 1,000 mcg by mouth daily.    Immunization History  Administered Date(s) Administered   Fluad Quad(high Dose 65+) 02/03/2019, 01/30/2022, 03/11/2023   Influenza Split 01/30/2015   Influenza, High Dose Seasonal PF 02/07/2018, 02/23/2020, 03/14/2021   Influenza,inj,Quad PF,6+ Mos 01/31/2016, 06/24/2017   Influenza-Unspecified 06/05/2015   PFIZER Comirnaty(Gray Top)Covid-19 Tri-Sucrose Vaccine 03/15/2023   PFIZER(Purple Top)SARS-COV-2 Vaccination 07/16/2019, 08/09/2019, 02/09/2020   Pneumococcal Conjugate-13 06/05/2015   Pneumococcal Polysaccharide-23 03/28/2012   Tdap 06/23/2012      Objective:   BP 118/76 (BP Location: Left Arm, Cuff Size: Normal)   Pulse 70   Temp (!) 96.9 F (36.1 C)   Ht 5' 4.5" (1.638 m)   Wt 138 lb 3.2 oz (62.7 kg)   SpO2 96%   BMI 23.36 kg/m   SpO2: 96 % O2 Device: None (Room air)  GENERAL: Well-developed, well-nourished woman, impeccably groomed, no acute distress.  Fully ambulatory, no conversational  dyspnea. HEAD: Normocephalic, atraumatic.  EYES: Pupils equal, round, reactive to light.  No scleral icterus.  MOUTH: Dentures both.  Oral mucosa moist. NECK: Supple. No thyromegaly. Trachea midline. No JVD.  No adenopathy. PULMONARY: Good air entry bilaterally.  Coarse otherwise, no adventitious sounds. CARDIOVASCULAR: S1 and S2. Regular rate and rhythm.  No rubs, murmurs or gallops heard. ABDOMEN: Benign. MUSCULOSKELETAL: No joint deformity, no clubbing, no edema.  NEUROLOGIC: No overt focal deficit, no gait disturbance, speech is fluent. SKIN: Intact,warm,dry. PSYCH: Mood and behavior normal   Ambulatory oxymetry was performed today:  At rest on room air oxygen saturation was 96%, the patient ambulated at a moderate pace, completed 3 laps, O2 nadir 91%, moderate shortness of breath.  Resting heart rate was 71 bpm at maximum for this exercise 110 bpm.    Assessment & Plan:     ICD-10-CM   1. Dyspnea, unspecified type  R06.00 Overnight Pulse Oximetry Study    Pulmonary Function Test [facility] Only    ECHOCARDIOGRAM COMPLETE    2. Stage 2 moderate COPD by GOLD classification (HCC)  J44.9 Overnight Pulse Oximetry Study  Pulmonary Function Test [facility] Only    3. Former smoker  Z87.891       Orders Placed This Encounter  Procedures   Overnight Pulse Oximetry Study    Room air DME: new start    Standing Status:   Future    Expiration Date:   07/22/2024   Pulmonary Function Test [facility] Only    Standing Status:   Future    Expected Date:   08/06/2023    Expiration Date:   07/22/2024    Full PFT: includes the following: basic spirometry, spirometry pre & post bronchodilator, diffusion capacity (DLCO), lung volumes:   Full PFT    This test can only be performed at:   [facility]   ECHOCARDIOGRAM COMPLETE    Standing Status:   Future    Expected Date:   07/30/2023    Expiration Date:   07/22/2024    Where should this test be performed:   [facility]    Perflutren DEFINITY  (image enhancing agent) should be administered unless hypersensitivity or allergy exist:   Administer Perflutren    Reason for exam-Echo:   Dyspnea  R06.00   Discussion:    Chronic Obstructive Pulmonary Disease (COPD) - Stage 2 Stacey Durham presents with worsening dyspnea, particularly in cold weather. Physical examination reveals clear lung sounds. Oxygen saturation remained above 90% during a walking test, indicating no immediate red flags. - Continue Trelegy - Use rescue inhaler before going outside - Wear a scarf in cold weather - Order overnight oxygen level monitoring - Order echocardiogram - Repeat PFTs - Follow-up in two months.      Advised if symptoms do not improve or worsen, to please contact office for sooner follow up or seek emergency care.    I spent 32 minutes of dedicated to the care of this patient on the date of this encounter to include pre-visit review of records, face-to-face time with the patient discussing conditions above, post visit ordering of testing, clinical documentation with the electronic health record, making appropriate referrals as documented, and communicating necessary findings to members of the patients care team.     C. Danice Goltz, MD Advanced Bronchoscopy PCCM [facility]    *This note was generated using voice recognition software/Dragon and/or AI transcription program.  Despite best efforts to proofread, errors can occur which can change the meaning. Any transcriptional errors that result from this process are unintentional and may not be fully corrected at the time of dictation.

## 2023-07-23 NOTE — Patient Instructions (Addendum)
 VISIT SUMMARY:  Stacey Durham today we discussed your issues with stage II COPD and your issues with shortness of breath particularly when exposed to the cold.  YOUR PLAN:  -CHRONIC OBSTRUCTIVE PULMONARY DISEASE (COPD) - STAGE 2: COPD is a chronic lung condition that makes it hard to breathe. Stacey Durham's symptoms have worsened, especially in cold weather. The plan includes continuing her current medication (Trelegy), using her rescue inhaler before going outside, and wearing a scarf in cold weather. Additionally, overnight oxygen level monitoring, an echocardiogram, and repeat pulmonary function tests have been ordered. A follow-up appointment is scheduled in two months.  INSTRUCTIONS:  Please follow up in two months. In the meantime, continue using Trelegy as prescribed, use your rescue inhaler before going outside, and wear a scarf in cold weather to protect nose and mouth. We will also conduct overnight oxygen level monitoring, an echocardiogram, and repeat spirometry to better understand your condition.

## 2023-08-11 ENCOUNTER — Ambulatory Visit
Admission: RE | Admit: 2023-08-11 | Discharge: 2023-08-11 | Disposition: A | Discharge status: Home / Self Care | Payer: No Typology Code available for payment source | Source: Ambulatory Visit | Attending: Pulmonary Disease | Admitting: Pulmonary Disease

## 2023-08-11 DIAGNOSIS — R0600 Dyspnea, unspecified: Secondary | ICD-10-CM | POA: Diagnosis not present

## 2023-08-11 DIAGNOSIS — J449 Chronic obstructive pulmonary disease, unspecified: Secondary | ICD-10-CM | POA: Diagnosis not present

## 2023-08-11 LAB — ECHOCARDIOGRAM COMPLETE
AR max vel: 1.75 cm2
AV Area VTI: 2.26 cm2
AV Area mean vel: 1.84 cm2
AV Mean grad: 3 mmHg
AV Peak grad: 4.8 mmHg
Ao pk vel: 1.09 m/s
Area-P 1/2: 3.93 cm2
MV VTI: 2.38 cm2
S' Lateral: 3 cm

## 2023-08-11 NOTE — Progress Notes (Signed)
*  PRELIMINARY RESULTS* Echocardiogram 2D Echocardiogram has been performed.  Cristela Blue 08/11/2023, 10:52 AM

## 2023-08-23 ENCOUNTER — Telehealth: Payer: Self-pay | Admitting: Pulmonary Disease

## 2023-08-23 ENCOUNTER — Ambulatory Visit (INDEPENDENT_AMBULATORY_CARE_PROVIDER_SITE_OTHER): Payer: No Typology Code available for payment source

## 2023-08-23 VITALS — BP 118/76 | Ht 64.5 in | Wt 137.0 lb

## 2023-08-23 DIAGNOSIS — Z0000 Encounter for general adult medical examination without abnormal findings: Secondary | ICD-10-CM

## 2023-08-23 DIAGNOSIS — Z2821 Immunization not carried out because of patient refusal: Secondary | ICD-10-CM | POA: Diagnosis not present

## 2023-08-23 MED ORDER — ALBUTEROL SULFATE HFA 108 (90 BASE) MCG/ACT IN AERS: 2.0000 | INHALATION_SPRAY | RESPIRATORY_TRACT | 11 refills | Status: AC | PRN

## 2023-08-23 NOTE — Progress Notes (Signed)
 Because this visit was a virtual/telehealth visit,  certain criteria was not obtained, such a blood pressure, CBG if applicable, and timed get up and go. Any medications not marked as "taking" were not mentioned during the medication reconciliation part of the visit. Any vitals not documented were not able to be obtained due to this being a telehealth visit or patient was unable to self-report a recent blood pressure reading due to a lack of equipment at home via telehealth. Vitals that have been documented are verbally provided by the patient.   Subjective:   Stacey Durham is a 78 y.o. who presents for a Medicare Wellness preventive visit.  Visit Complete: Virtual I connected with  Stacey Durham on 08/23/23 by a audio enabled telemedicine application and verified that I am speaking with the correct person using two identifiers.  Patient Location: Home  Provider Location: Home Office  I discussed the limitations of evaluation and management by telemedicine. The patient expressed understanding and agreed to proceed.  Vital Signs: Because this visit was a virtual/telehealth visit, some criteria may be missing or patient reported. Any vitals not documented were not able to be obtained and vitals that have been documented are patient reported.  VideoDeclined- This patient declined Librarian, academic. Therefore the visit was completed with audio only.  Persons Participating in Visit: Patient.  AWV Questionnaire: No: Patient Medicare AWV questionnaire was not completed prior to this visit.  Cardiac Risk Factors include: advanced age (>42men, >41 women);sedentary lifestyle     Objective:    Today's Vitals   08/23/23 1051  BP: 118/76  Weight: 137 lb (62.1 kg)  Height: 5' 4.5" (1.638 m)   Body mass index is 23.15 kg/m.     08/23/2023   10:50 AM 08/18/2022    3:17 PM 10/17/2021    9:40 AM 12/10/2019    4:42 PM 12/10/2019   11:38 AM 07/28/2019    11:09 AM 04/03/2019    4:28 PM  Advanced Directives  Does Patient Have a Medical Advance Directive? Yes No No No No No No  Type of Estate agent of [place];Living will        Does patient want to make changes to medical advance directive? No - Patient declined        Copy of Healthcare Power of Attorney in Chart? No - copy requested        Would patient like information on creating a medical advance directive?  No - Patient declined  No - Patient declined No - Patient declined No - Patient declined     Current Medications (verified) Outpatient Encounter Medications as of 08/23/2023  Medication Sig   albuterol (PROVENTIL) (2.5 MG/3ML) 0.083% nebulizer solution Take 3 mLs (2.5 mg total) by nebulization every 6 (six) hours as needed for wheezing or shortness of breath.   albuterol (VENTOLIN HFA) 108 (90 Base) MCG/ACT inhaler INHALE 2 PUFFS INTO THE LUNGS EVERY 4 HOURS AS NEEDED FOR WHEEZING OR SHORTNESS OF BREATH.   atorvastatin (LIPITOR) 20 MG tablet Take 1 tablet (20 mg total) by mouth daily.   Biotin 78295 MCG TABS Take 10 mg by mouth daily.    calcium-vitamin D (OSCAL WITH D) 500-200 MG-UNIT tablet Take 1 tablet by mouth.   Dextromethorphan-Guaifenesin (MUCINEX DM PO) Take 1 tablet by mouth daily.   montelukast (SINGULAIR) 10 MG tablet Take 1 tablet (10 mg total) by mouth daily.   Multiple Vitamins-Minerals (MULTIVITAMIN ADULT PO) Take 1 tablet by mouth.  Probiotic Product (PROBIOTIC-10 PO) Take 1 tablet by mouth.   TRELEGY ELLIPTA 100-62.5-25 MCG/ACT AEPB INHALE 1 PUFF BY MOUTH EVERY DAY   vitamin B-12 (CYANOCOBALAMIN) 1000 MCG tablet Take 1,000 mcg by mouth daily.   No facility-administered encounter medications on file as of 08/23/2023.    Allergies (verified) Hydrocodone-acetaminophen and Tramadol   History: Past Medical History:  Diagnosis Date   Acute cholecystitis    Asthma    Cholecystitis with cholelithiasis 10/11/2015   COPD (chronic obstructive  pulmonary disease) (HCC)    Ex-smoker    quit 2000s   Pneumonia 11/26/2015   Past Surgical History:  Procedure Laterality Date   ABDOMINAL HYSTERECTOMY  1978   part of an ovary remained   APPENDECTOMY  1978   BREAST BIOPSY Right 12/01/2022   MM RT BREAST BX W LOC DEV 1ST LESION IMAGE BX SPEC STEREO GUIDE 12/01/2022 [facility]   CATARACT EXTRACTION, BILATERAL  2011/09/01   CHOLECYSTECTOMY N/A 10/11/2015   Procedure: LAPAROSCOPIC CHOLECYSTECTOMY;  Surgeon: Leafy Ro, MD;  Location: [facility] ORS;  Service: General;  Laterality: N/A;   COLONOSCOPY  2016-08-31   HP, ext hem, diverticulosis, rpt 5 yrs (Outlaw)   COLONOSCOPY  12/2021   TA x2, hemorrhoids, diverticulosis (Outlaw)   TONSILLECTOMY     Family History  Problem Relation Age of Onset   Diabetes Mother    Heart attack Father 61   Diabetes Father    Stroke Neg Hx    Cancer Neg Hx    Breast cancer Neg Hx    Social History   Socioeconomic History   Marital status: Married    Spouse name: Not on file   Number of children: Not on file   Years of education: Not on file   Highest education level: Not on file  Occupational History   Not on file  Tobacco Use   Smoking status: Former    Current packs/day: 0.00    Average packs/day: 1.3 packs/day for 40.0 years (50.0 ttl pk-yrs)    Types: Cigarettes    Start date: 64    Quit date: 08-31-2001    Years since quitting: 22.2   Smokeless tobacco: Never  Vaping Use   Vaping status: Never Used  Substance and Sexual Activity   Alcohol use: No   Drug use: No   Sexual activity: Not on file  Other Topics Concern   Not on file  Social History Narrative   Originally from [place]   Widower - husband passed away September 01, 2010   Lives alone, 1 chihuahua   GF of Page Spiro   Occ: retired, worked at [employer]: enjoys going to [employer]: good water, fruits/vegetables daily   Social Drivers of Corporate investment banker Strain: Low Risk  (08/23/2023)   Overall Financial  Resource Strain (CARDIA)    Difficulty of Paying Living Expenses: Not hard at all  Food Insecurity: No Food Insecurity (08/23/2023)   Hunger Vital Sign    Worried About Running Out of Food in the Last Year: Never true    Ran Out of Food in the Last Year: Never true  Transportation Needs: No Transportation Needs (08/23/2023)   PRAPARE - Administrator, Civil Service (Medical): No    Lack of Transportation (Non-Medical): No  Physical Activity: Insufficiently Active (08/23/2023)   Exercise Vital Sign    Days of Exercise per Week: 7 days    Minutes of Exercise per Session: 20 min  Stress:  No Stress Concern Present (08/23/2023)   [employer] of Occupational Health - Occupational Stress Questionnaire    Feeling of Stress : Not at all  Social Connections: Socially Integrated (08/23/2023)   Social Connection and Isolation Panel [NHANES]    Frequency of Communication with Friends and Family: More than three times a week    Frequency of Social Gatherings with Friends and Family: More than three times a week    Attends Religious Services: More than 4 times per year    Active Member of [employer] or Organizations: Yes    Attends Engineer, structural: More than 4 times per year    Marital Status: Married    Tobacco Counseling Counseling given: Not Answered    Clinical Intake:  Pre-visit preparation completed: Yes  Pain : No/denies pain     BMI - recorded: 23.15 Nutritional Status: BMI of 19-24  Normal Nutritional Risks: None Diabetes: No  Lab Results  Component Value Date   HGBA1C 6.3 12/09/2022   HGBA1C 6.2 08/04/2021   HGBA1C 6.1 08/05/2020           Information entered by :: Isidoro Donning, CMA   Activities of Daily Living     08/23/2023   10:54 AM  In your present state of health, do you have any difficulty performing the following activities:  Hearing? 0  Vision? 0  Difficulty concentrating or making decisions? 0  Walking or climbing stairs?  0  Dressing or bathing? 0  Doing errands, shopping? 0  Preparing Food and eating ? N  Using the Toilet? N  In the past six months, have you accidently leaked urine? Y  Comment sometimes  Do you have problems with loss of bowel control? Y  Comment sometimes  Managing your Medications? N  Managing your Finances? N  Housekeeping or managing your Housekeeping? N    Patient Care Team: Eustaquio Boyden, MD as PCP - General (Family Medicine) Willis Modena, MD as Consulting Physician (Gastroenterology) Carrie Mew, OD as Consulting Physician (Optometry)  Indicate any recent Medical Services you may have received from other than [facility] providers in the past year (date may be approximate).     Assessment:   This is a routine wellness examination for Ivannia.  Hearing/Vision screen Hearing Screening - Comments:: No hearing  Vision Screening - Comments:: No vision issues   Goals Addressed             This Visit's Progress    Patient Stated       She would like to visit [place]        Depression Screen     08/23/2023   10:56 AM 06/01/2023    8:33 AM 08/18/2022    3:17 PM 08/07/2020   10:37 AM 07/28/2019   11:10 AM 07/21/2018   10:10 AM 07/14/2017    8:10 AM  PHQ 2/9 Scores  PHQ - 2 Score 0 0 0 0 0 0 0  PHQ- 9 Score 0 4   0 0 0    Fall Risk     08/23/2023   10:53 AM 06/01/2023    8:33 AM 08/18/2022    3:18 PM 08/07/2020   10:36 AM 08/04/2019    8:53 AM  Fall Risk   Falls in the past year? 0 1 0  1  Number falls in past yr: 0  0 1 0  Injury with Fall? 0 1 0 0 1  Risk for fall due to : No Fall Risks  No  Fall Risks  History of fall(s)  Follow up Falls evaluation completed  Falls prevention discussed;Falls evaluation completed  Falls prevention discussed    MEDICARE RISK AT HOME:  Medicare Risk at Home Any stairs in or around the home?: No If so, are there any without handrails?: No Home free of loose throw rugs in walkways, pet beds, electrical cords, etc?:  Yes Adequate lighting in your home to reduce risk of falls?: Yes Life alert?: No Use of a cane, walker or w/c?: No Grab bars in the bathroom?: Yes Shower chair or bench in shower?: Yes Elevated toilet seat or a handicapped toilet?: Yes  TIMED UP AND GO:  Was the test performed?  No  Cognitive Function: 6CIT completed    07/28/2019   11:10 AM 07/21/2018   10:10 AM 07/14/2017    8:20 AM 06/22/2016    8:59 AM  MMSE - Mini Mental State Exam  Orientation to time 5 5 5 5   Orientation to Place 5 5 5 5   Registration 3 3 3 3   Attention/ Calculation 5 0 0 0  Recall 3 1 3 3   Recall-comments  unable to recall 2 of 3 words    Language- name 2 objects  0 0 0  Language- repeat 1 1 1 1   Language- follow 3 step command  3 3 3   Language- read & follow direction  0 0 0  Write a sentence  0 0 0  Copy design  0 0 0  Total score  18 20 20         08/23/2023   10:59 AM 08/18/2022    3:19 PM  6CIT Screen  What Year? 0 points 0 points  What month? 0 points 0 points  What time? 0 points 0 points  Count back from 20 0 points 0 points  Months in reverse 0 points 4 points  Repeat phrase 0 points 4 points  Total Score 0 points 8 points    Immunizations Immunization History  Administered Date(s) Administered   Fluad Quad(high Dose 65+) 02/03/2019, 01/30/2022, 03/11/2023   Influenza Split 01/30/2015   Influenza, High Dose Seasonal PF 02/07/2018, 02/23/2020, 03/14/2021   Influenza,inj,Quad PF,6+ Mos 01/31/2016, 06/24/2017   Influenza-Unspecified 06/05/2015   PFIZER Comirnaty(Gray Top)Covid-19 Tri-Sucrose Vaccine 03/15/2023   PFIZER(Purple Top)SARS-COV-2 Vaccination 07/16/2019, 08/09/2019, 02/09/2020   Pneumococcal Conjugate-13 06/05/2015   Pneumococcal Polysaccharide-23 03/28/2012   Tdap 06/23/2012    Screening Tests Health Maintenance  Topic Date Due   Zoster Vaccines- Shingrix (1 of 2) Never done   DTaP/Tdap/Td (2 - Td or Tdap) 06/23/2022   COVID-19 Vaccine (5 - 2024-25 season)  05/10/2023   MAMMOGRAM  11/30/2023   Medicare Annual Wellness (AWV)  08/22/2024   Colonoscopy  01/29/2027   Pneumonia Vaccine 57+ Years old  Completed   INFLUENZA VACCINE  Completed   DEXA SCAN  Completed   Hepatitis C Screening  Completed   HPV VACCINES  Aged Out    Health Maintenance  Health Maintenance Due  Topic Date Due   Zoster Vaccines- Shingrix (1 of 2) Never done   DTaP/Tdap/Td (2 - Td or Tdap) 06/23/2022   COVID-19 Vaccine (5 - 2024-25 season) 05/10/2023   Health Maintenance Items Addressed: patient has received the last covid vaccine in October 2024 and will go get the Tdap from office or pharmacy   Additional Screening:  Vision Screening: Recommended annual ophthalmology exams for early detection of glaucoma and other disorders of the eye.  Dental Screening: Recommended annual dental exams for  proper oral hygiene  Community Resource Referral / Chronic Care Management: CRR required this visit?  No   CCM required this visit?  No     Plan:     I have personally reviewed and noted the following in the patient's chart:   Medical and social history Use of alcohol, tobacco or illicit drugs  Current medications and supplements including opioid prescriptions. Patient is not currently taking opioid prescriptions. Functional ability and status Nutritional status Physical activity Advanced directives List of other physicians Hospitalizations, surgeries, and ER visits in previous 12 months Vitals Screenings to include cognitive, depression, and falls Referrals and appointments  In addition, I have reviewed and discussed with patient certain preventive protocols, quality metrics, and best practice recommendations. A written personalized care plan for preventive services as well as general preventive health recommendations were provided to patient.     Rudi Heap, [place]   08/23/2023   After Visit Summary: (MyChart) Due to this being a telephonic visit, the  after visit summary with patients personalized plan was offered to patient via MyChart   Notes: Nothing significant to report at this time.

## 2023-08-23 NOTE — Telephone Encounter (Signed)
 I have sent in the prescription and notified the patient.  Nothing further needed.

## 2023-08-23 NOTE — Telephone Encounter (Signed)
 Asking for albuterol inhaler rx to be sent to [facility] at [facility]

## 2023-08-23 NOTE — Patient Instructions (Signed)
 Ms. Rider , Thank you for taking time to come for your Medicare Wellness Visit. I appreciate your ongoing commitment to your health goals. Please review the following plan we discussed and let me know if I can assist you in the future.   Referrals/Orders/Follow-Ups/Clinician Recommendations: Follow up with getting Tdap and Shingles vaccine   This is a list of the screening recommended for you and due dates:  Health Maintenance  Topic Date Due   Zoster (Shingles) Vaccine (1 of 2) Never done   DTaP/Tdap/Td vaccine (2 - Td or Tdap) 06/23/2022   COVID-19 Vaccine (5 - 2024-25 season) 05/10/2023   Mammogram  11/30/2023   Medicare Annual Wellness Visit  08/22/2024   Colon Cancer Screening  01/29/2027   Pneumonia Vaccine  Completed   Flu Shot  Completed   DEXA scan (bone density measurement)  Completed   Hepatitis C Screening  Completed   HPV Vaccine  Aged Out    Advanced directives: (Copy Requested) Please bring a copy of your health care power of attorney and living will to the office to be added to your chart at your convenience. You can mail to [facility] [address] [place], [place] [address] or email to [email or web address]  Next Medicare Annual Wellness Visit scheduled for next year: Yes

## 2023-09-01 DIAGNOSIS — G4730 Sleep apnea, unspecified: Secondary | ICD-10-CM | POA: Diagnosis not present

## 2023-09-01 DIAGNOSIS — R0902 Hypoxemia: Secondary | ICD-10-CM | POA: Diagnosis not present

## 2023-09-03 ENCOUNTER — Telehealth: Payer: Self-pay

## 2023-09-03 DIAGNOSIS — R0609 Other forms of dyspnea: Secondary | ICD-10-CM

## 2023-09-03 DIAGNOSIS — J449 Chronic obstructive pulmonary disease, unspecified: Secondary | ICD-10-CM

## 2023-09-03 NOTE — Telephone Encounter (Signed)
 Overnight Pulse Oximetry Report: study completed on 09/01/2023-09/02/2023.   Dr. Jayme Cloud did receive and reviewed report. Per report patient will need oxygen during the night. DME order placed for 2L during the night.   Patient advised. Nothing further needed.

## 2023-09-23 ENCOUNTER — Ambulatory Visit: Payer: No Typology Code available for payment source | Admitting: Pulmonary Disease

## 2023-09-23 ENCOUNTER — Encounter: Payer: Self-pay | Admitting: Pulmonary Disease

## 2023-09-23 ENCOUNTER — Ambulatory Visit

## 2023-09-23 VITALS — BP 100/70 | HR 89 | Temp 97.7°F | Ht 64.0 in | Wt 142.2 lb

## 2023-09-23 DIAGNOSIS — J301 Allergic rhinitis due to pollen: Secondary | ICD-10-CM

## 2023-09-23 DIAGNOSIS — J449 Chronic obstructive pulmonary disease, unspecified: Secondary | ICD-10-CM | POA: Diagnosis not present

## 2023-09-23 DIAGNOSIS — J4489 Other specified chronic obstructive pulmonary disease: Secondary | ICD-10-CM | POA: Diagnosis not present

## 2023-09-23 DIAGNOSIS — G4736 Sleep related hypoventilation in conditions classified elsewhere: Secondary | ICD-10-CM | POA: Diagnosis not present

## 2023-09-23 MED ORDER — PREDNISONE 20 MG PO TABS: 20.0000 mg | ORAL_TABLET | Freq: Every day | ORAL | 0 refills | Status: AC

## 2023-09-23 MED ORDER — MOMETASONE FUROATE 50 MCG/ACT NA SUSP: 2.0000 | Freq: Every day | NASAL | 12 refills | Status: AC

## 2023-09-23 NOTE — Progress Notes (Signed)
 Subjective:    Patient ID: Stacey Durham, female    DOB: 1945/10/23, 78 y.o.   MRN: [id number]  Patient Care Team: Claire Crick, MD as PCP - General (Family Medicine) Evangeline Hilts, MD as Consulting Physician (Gastroenterology) Kristopher Pheasant, OD as Consulting Physician [facility])  Chief Complaint  Patient presents with   Follow-up    2 month: COPD, o2 @ [facility], Echo: 3/12, ono: 4/2, no PFT  Severe allergies, but not taking anything for it -- timid to take anything but would like to discuss her options/recommendations, still has the wet crackly cough, SOB/DOE, and sleep has not been impacted/asymptomatic. Pt is unaware of DME of O2.    BACKGROUND/INTERVAL:Patient is a 79 year old former smoker (quit 2003, 50 PY) who presents for follow-up on the issue of COPD.  I last saw her on 23 July 2023.   HPI Discussed the use of AI scribe software for clinical note transcription with the patient, who gave verbal consent to proceed.  History of Present Illness   Stacey Durham is a 78 year old female with OPD and nocturnal hypoxemia who presents for follow-up.  She experiences significant nasal congestion and allergies, describing her head as 'so clogged up.' She has not been using nasal sprays regularly but has some available. She has not been taking Allegra or Claritin but is open to starting Claritin.  She did not attend a scheduled breathing test due to her congestion, feeling it would not provide accurate results. She has set up with nocturnal oxygen  and reports sleeping well with it.  She notes benefit of the therapy.  She has not had any fevers, chills or sweats.  She has a "wet cough" but overall nonproductive and when productive it is not discolored.  She does not endorse increasing shortness of breath.  Most of her symptoms are related to nasal congestion and teary watery eyes.     DATA 10/04/2015 PFTs: FEV1 1.10 L or 46% predicted, FVC 2.10 L or 67% predicted,  FEV1/FVC 52%, no bronchodilator response, lung volumes normal by nitrogen washout.  Diffusion capacity mildly reduced. 12/03/2021 echocardiogram: FEV1 55 to 60% grade 1 DD, no wall motion abnormalities, RV function normal.  No valvular abnormalities. 05/21/2022 PFTs: FEV1 1.19 L or 55% predicted, FVC 2.34 L or 81% predicted, FEV1/FVC 51%, no bronchodilator response.  Lung volumes were not performed due to patient's inability to perform the test.  Diffusion capacity moderately reduced.  Consistent with severe obstructive airways disease.  Review of Systems A 10 point review of systems was performed and it is as noted above otherwise negative.   Patient Active Problem List   Diagnosis Date Noted   Acute right-sided thoracic back pain 06/01/2023   Skin lesion of cheek 08/12/2021   Medicare annual wellness visit, subsequent 08/07/2020   Urge incontinence of urine 08/07/2020   Grade III diastolic dysfunction 12/16/2019   CKD (chronic kidney disease) stage 3, GFR 30-59 ml/min (HCC) 07/28/2018   Chronic diarrhea 05/10/2018   Chronic respiratory failure with hypoxia (HCC) 01/29/2018   Osteopenia 09/25/2017   Pulmonary nodule 02/02/2017   Neck pain 01/30/2017   Encounter for general adult medical examination with abnormal findings 06/26/2016   Advanced care planning/counseling discussion 06/26/2016   HLD (hyperlipidemia) 06/26/2016   Prediabetes 06/26/2016   Hair loss 01/07/2016   Subclinical hypothyroidism 01/07/2016   Aortic atherosclerosis (HCC) 01/07/2016   CAD (coronary artery disease) 01/07/2016   COVID-19 virus infection 11/26/2015   COPD exacerbation (HCC) 11/26/2015  Ex-smoker 09/25/2015   Chronic obstructive pulmonary disease (HCC) 08/16/2015    Social History   Tobacco Use   Smoking status: Former    Current packs/day: 0.00    Average packs/day: 1.3 packs/day for 40.0 years (50.0 ttl pk-yrs)    Types: Cigarettes    Start date: 1964    Quit date: 2003    Years since  quitting: 22.3   Smokeless tobacco: Never  Substance Use Topics   Alcohol use: No    Allergies  Allergen Reactions   Pollen Extract Cough   Hydrocodone -Acetaminophen  Other (See Comments)   Tramadol  Other (See Comments)    Lightheaded, imbalance    Current Meds  Medication Sig   albuterol  (PROVENTIL ) (2.5 MG/3ML) 0.083% nebulizer solution Take 3 mLs (2.5 mg total) by nebulization every 6 (six) hours as needed for wheezing or shortness of breath.   albuterol  (VENTOLIN  HFA) 108 (90 Base) MCG/ACT inhaler Inhale 2 puffs into the lungs every 4 (four) hours as needed for wheezing or shortness of breath.   atorvastatin  (LIPITOR) 20 MG tablet Take 1 tablet (20 mg total) by mouth daily.   Biotin 40981 MCG TABS Take 10 mg by mouth daily.    calcium -vitamin D  (OSCAL WITH D) 500-200 MG-UNIT tablet Take 1 tablet by mouth.   Dextromethorphan-Guaifenesin  (MUCINEX  DM PO) Take 1 tablet by mouth daily.   mometasone  (NASONEX ) 50 MCG/ACT nasal spray Place 2 sprays into the nose daily.   montelukast  (SINGULAIR ) 10 MG tablet Take 1 tablet (10 mg total) by mouth daily.   Multiple Vitamins-Minerals (MULTIVITAMIN ADULT PO) Take 1 tablet by mouth.   predniSONE  (DELTASONE ) 20 MG tablet Take 1 tablet (20 mg total) by mouth daily with breakfast for 5 days.   Probiotic Product (PROBIOTIC-10 PO) Take 1 tablet by mouth.   TRELEGY ELLIPTA  100-62.5-25 MCG/ACT AEPB INHALE 1 PUFF BY MOUTH EVERY DAY   vitamin B-12 (CYANOCOBALAMIN ) 1000 MCG tablet Take 1,000 mcg by mouth daily.    Immunization History  Administered Date(s) Administered   Fluad Quad(high Dose 65+) 02/03/2019, 01/30/2022, 03/11/2023   Influenza Split 01/30/2015   Influenza, High Dose Seasonal PF 02/07/2018, 02/23/2020, 03/14/2021   Influenza,inj,Quad PF,6+ Mos 01/31/2016, 06/24/2017   Influenza-Unspecified 06/05/2015   PFIZER Comirnaty(Gray Top)Covid-19 Tri-Sucrose Vaccine 03/15/2023   PFIZER(Purple Top)SARS-COV-2 Vaccination 07/16/2019, 08/09/2019,  02/09/2020   Pneumococcal Conjugate-13 06/05/2015   Pneumococcal Polysaccharide-23 03/28/2012   Tdap 06/23/2012        Objective:     BP 100/70 (BP Location: Right Arm, Patient Position: Sitting, Cuff Size: Normal)   Pulse 89   Temp 97.7 F (36.5 C) (Oral)   Ht 5\' 4"  (1.626 m)   Wt 142 lb 3.2 oz (64.5 kg)   SpO2 93%   BMI 24.41 kg/m   SpO2: 93 %  GENERAL: Well-developed, well-nourished woman, impeccably groomed, no acute distress.  Fully ambulatory, no conversational dyspnea. HEAD: Normocephalic, atraumatic.  EYES: Pupils equal, round, reactive to light.  No scleral icterus. Watery eyes. MOUTH: Dentures both.  Oral mucosa moist. NECK: Supple. No thyromegaly. Trachea midline. No JVD.  No adenopathy. PULMONARY: Good air entry bilaterally.  Coarse otherwise, no adventitious sounds. CARDIOVASCULAR: S1 and S2. Regular rate and rhythm.  No rubs, murmurs or gallops heard. ABDOMEN: Benign. MUSCULOSKELETAL: No joint deformity, no clubbing, no edema.  NEUROLOGIC: No overt focal deficit, no gait disturbance, speech is fluent. SKIN: Intact,warm,dry. PSYCH: Mood and behavior normal      Assessment & Plan:     ICD-10-CM   1. Stage 2  moderate COPD by GOLD classification (HCC)  J44.9     2. Nocturnal hypoxemia due to obstructive chronic bronchitis (HCC)  J44.89    G47.36     3. Seasonal allergic rhinitis due to pollen  J30.1       Meds ordered this encounter  Medications   mometasone  (NASONEX ) 50 MCG/ACT nasal spray    Sig: Place 2 sprays into the nose daily.    Dispense:  17 g    Refill:  12   predniSONE  (DELTASONE ) 20 MG tablet    Sig: Take 1 tablet (20 mg total) by mouth daily with breakfast for 5 days.    Dispense:  5 tablet    Refill:  0   Discussion:    COPD with nocturnal hypoxemia Nocturnal hypoxemia managed with supplemental oxygen . She reports sleeping well with oxygen  therapy. No current concerns of cardiac stress related to hypoxemia. - Continue oxygen   therapy during sleep. - Reschedule pulmonary function test.  Allergic rhinitis Chronic allergic rhinitis with significant nasal congestion and rhinorrhea, causing a sensation of a clogged head and watery eyes. Symptoms interfere with daily activities. - Prescribe a five-day course of prednisone  to reduce inflammation and congestion. - Prescribe a nasal spray to alleviate nasal symptoms. - Recommend over-the-counter Claritin for daytime allergy management.     Advised if symptoms do not improve or worsen, to please contact office for sooner follow up or seek emergency care.    I spent 30 minutes of dedicated to the care of this patient on the date of this encounter to include pre-visit review of records, face-to-face time with the patient discussing conditions above, post visit ordering of testing, clinical documentation with the electronic health record, making appropriate referrals as documented, and communicating necessary findings to members of the patients care team.     C. Chloe Counter, MD Advanced Bronchoscopy PCCM [facility]    *This note was generated using voice recognition software/Dragon and/or AI transcription program.  Despite best efforts to proofread, errors can occur which can change the meaning. Any transcriptional errors that result from this process are unintentional and may not be fully corrected at the time of dictation.

## 2023-09-23 NOTE — Patient Instructions (Addendum)
  VISIT SUMMARY:  Stacey Durham, you had a follow-up appointment today to discuss your nocturnal hypoxemia and nasal congestion due to allergies. You mentioned that you have been experiencing significant nasal congestion and have not been using nasal sprays or taking allergy medications regularly. You also reported that you did not attend a scheduled breathing test due to your congestion but have been sleeping well with your oxygen  therapy.  YOUR PLAN:  - COPD: Continue using your Trelegy Ellipta  and as needed albuterol .  Your lungs sounded very clear today.  -NOCTURNAL HYPOXEMIA: Nocturnal hypoxemia means low oxygen  levels during sleep. You are managing this condition with supplemental oxygen  and report sleeping well with it. Continue using your oxygen  therapy during sleep and reschedule your pulmonary function test.  -ALLERGIC RHINITIS: Allergic rhinitis is inflammation of the nasal passages due to allergies, causing symptoms like nasal congestion, a sensation of a clogged head, and watery eyes. To manage this, you will start a five-day course of prednisone  to reduce inflammation and congestion, use a prescribed nasal spray to alleviate nasal symptoms, and take over-the-counter Claritin for daytime allergy management.  INSTRUCTIONS:  Please continue using your oxygen  therapy during sleep and reschedule your pulmonary function test. Follow the prescribed course of prednisone  for five days, use the nasal spray as directed, and take Claritin for daytime allergy management.  We will see you in follow-up in 2 months time we will get the breathing test prior to your follow-up.

## 2023-09-27 ENCOUNTER — Encounter: Payer: Self-pay | Admitting: Pulmonary Disease

## 2023-11-30 ENCOUNTER — Encounter

## 2023-11-30 ENCOUNTER — Ambulatory Visit: Admitting: Pulmonary Disease

## 2023-11-30 ENCOUNTER — Other Ambulatory Visit: Payer: Self-pay

## 2023-11-30 DIAGNOSIS — R0602 Shortness of breath: Secondary | ICD-10-CM

## 2023-12-15 ENCOUNTER — Ambulatory Visit
Admission: RE | Admit: 2023-12-15 | Discharge: 2023-12-15 | Disposition: A | Discharge status: Home / Self Care | Source: Ambulatory Visit | Attending: Family Medicine | Admitting: Family Medicine

## 2023-12-15 DIAGNOSIS — R921 Mammographic calcification found on diagnostic imaging of breast: Secondary | ICD-10-CM | POA: Diagnosis not present

## 2023-12-16 ENCOUNTER — Ambulatory Visit: Payer: Self-pay | Admitting: Family Medicine

## 2023-12-22 DIAGNOSIS — D3131 Benign neoplasm of right choroid: Secondary | ICD-10-CM | POA: Diagnosis not present

## 2023-12-22 DIAGNOSIS — H524 Presbyopia: Secondary | ICD-10-CM | POA: Diagnosis not present

## 2023-12-22 DIAGNOSIS — H43813 Vitreous degeneration, bilateral: Secondary | ICD-10-CM | POA: Diagnosis not present

## 2023-12-22 DIAGNOSIS — H04123 Dry eye syndrome of bilateral lacrimal glands: Secondary | ICD-10-CM | POA: Diagnosis not present

## 2024-01-17 ENCOUNTER — Ambulatory Visit (INDEPENDENT_AMBULATORY_CARE_PROVIDER_SITE_OTHER): Admitting: Pulmonary Disease

## 2024-01-17 DIAGNOSIS — J449 Chronic obstructive pulmonary disease, unspecified: Secondary | ICD-10-CM

## 2024-01-17 DIAGNOSIS — R0602 Shortness of breath: Secondary | ICD-10-CM | POA: Diagnosis not present

## 2024-01-17 DIAGNOSIS — R0600 Dyspnea, unspecified: Secondary | ICD-10-CM

## 2024-01-17 LAB — PULMONARY FUNCTION TEST
DL/VA % pred: 66 %
DL/VA: 2.72 ml/min/mmHg/L
DLCO unc % pred: 51 %
DLCO unc: 9.54 ml/min/mmHg
FEF 25-75 Post: 0.5 L/s
FEF 25-75 Pre: 0.47 L/s
FEF2575-%Change-Post: 6 %
FEF2575-%Pred-Post: 34 %
FEF2575-%Pred-Pre: 32 %
FEV1-%Change-Post: -3 %
FEV1-%Pred-Post: 59 %
FEV1-%Pred-Pre: 61 %
FEV1-Post: 1.17 L
FEV1-Pre: 1.21 L
FEV1FVC-%Change-Post: -3 %
FEV1FVC-%Pred-Pre: 70 %
FEV6-%Change-Post: 0 %
FEV6-%Pred-Post: 90 %
FEV6-%Pred-Pre: 90 %
FEV6-Post: 2.26 L
FEV6-Pre: 2.26 L
FEV6FVC-%Change-Post: 0 %
FEV6FVC-%Pred-Post: 102 %
FEV6FVC-%Pred-Pre: 102 %
FVC-%Change-Post: 0 %
FVC-%Pred-Post: 88 %
FVC-%Pred-Pre: 87 %
FVC-Post: 2.34 L
FVC-Pre: 2.32 L
Post FEV1/FVC ratio: 50 %
Post FEV6/FVC ratio: 97 %
Pre FEV1/FVC ratio: 52 %
Pre FEV6/FVC Ratio: 97 %
RV % pred: 289 %
RV: 6.85 L
TLC % pred: 167 %
TLC: 8.45 L

## 2024-01-17 NOTE — Progress Notes (Signed)
 PFT completed today. The results of this test meets ATS standards for acceptability and repeatability for pre/post spirometry only. Pt had difficult time with testing with multiple attempts.

## 2024-01-18 ENCOUNTER — Ambulatory Visit (INDEPENDENT_AMBULATORY_CARE_PROVIDER_SITE_OTHER): Admitting: Pulmonary Disease

## 2024-01-18 VITALS — BP 136/86 | HR 79 | Temp 98.2°F | Ht 64.0 in | Wt 142.0 lb

## 2024-01-18 DIAGNOSIS — J4489 Other specified chronic obstructive pulmonary disease: Secondary | ICD-10-CM | POA: Diagnosis not present

## 2024-01-18 DIAGNOSIS — R0602 Shortness of breath: Secondary | ICD-10-CM

## 2024-01-18 DIAGNOSIS — G4736 Sleep related hypoventilation in conditions classified elsewhere: Secondary | ICD-10-CM

## 2024-01-18 DIAGNOSIS — J449 Chronic obstructive pulmonary disease, unspecified: Secondary | ICD-10-CM

## 2024-01-18 MED ORDER — ALBUTEROL SULFATE (2.5 MG/3ML) 0.083% IN NEBU
2.5000 mg | INHALATION_SOLUTION | Freq: Four times a day (QID) | RESPIRATORY_TRACT | 3 refills | Status: AC | PRN
Start: 2024-01-18 — End: ?

## 2024-01-18 MED ORDER — TRELEGY ELLIPTA 100-62.5-25 MCG/ACT IN AEPB: 1.0000 | INHALATION_SPRAY | Freq: Every day | RESPIRATORY_TRACT | 5 refills | Status: AC

## 2024-01-18 NOTE — Progress Notes (Signed)
 Subjective:    Patient ID: Stacey Durham, female    DOB: [birth year], 78 y.o.   MRN: [id number]  Patient Care Team: Rilla Baller, MD as PCP - General (Family Medicine) Burnette Fallow, MD as Consulting Physician (Gastroenterology) Lucio Franky PARAS, OD as Consulting Physician [facility])  Chief Complaint  Patient presents with   Follow-up    BACKGROUND/INTERVAL:Patient is a 78 year old former smoker (quit 2003, 50 PY) who presents for follow-up on the issue of COPD.  I last saw her on 23 July 2023.   HPI Discussed the use of AI scribe software for clinical note transcription with the patient, who gave verbal consent to proceed.  History of Present Illness   Stacey Durham is a 78 year old female with moderate COPD who presents for follow-up.  She reports that her shortness of breath is fine and has not noticed any new concerns or issues. No new concerns or issues with shortness of breath are reported.  Overall her pulmonary function testing is improved from prior.  We reviewed her study from 18 August (yesterday).  She quit smoking in 2003, which is a significant part of her medical history. She uses an inhaler regularly without issues and a nebulizer solution as needed, particularly during increased physical activity such as biking. She engages in physical activity by riding electric bikes, which still require pedaling, especially on inclines.  She is currently using Trelegy, and there is a concern about the increased cost of the medication, which has risen to $158 per month from $47 due to changes in insurance.     She has nocturnal hypoxemia associated with COPD.  She has been compliant with the therapy and notes benefit.   DATA 10/04/2015 PFTs: FEV1 1.10 L or 46% predicted, FVC 2.10 L or 67% predicted, FEV1/FVC 52%, no bronchodilator response, lung volumes normal by nitrogen washout.  Diffusion capacity mildly reduced. 12/03/2021 echocardiogram: FEV1 55 to 60%  grade 1 DD, no wall motion abnormalities, RV function normal.  No valvular abnormalities. 05/21/2022 PFTs: FEV1 1.19 L or 55% predicted, FVC 2.34 L or 81% predicted, FEV1/FVC 51%, no bronchodilator response.  Lung volumes were not performed due to patient's inability to perform the test.  Diffusion capacity moderately reduced.  Consistent with moderate obstructive airways disease. 01/17/2024 PFTs: FEV1 1.21 L or 61% predicted, FVC 2.32 L or 87% predicted, FEV1/FVC 52%, no bronchodilator response, there is significant hyperinflation and air trapping, diffusion capacity moderately reduced.  Overall stable to mildly improved function.   Review of Systems A 10 point review of systems was performed and it is as noted above otherwise negative.   Patient Active Problem List   Diagnosis Date Noted   Acute right-sided thoracic back pain 06/01/2023   Skin lesion of cheek 08/12/2021   Medicare annual wellness visit, subsequent 08/07/2020   Urge incontinence of urine 08/07/2020   Grade III diastolic dysfunction 12/16/2019   CKD (chronic kidney disease) stage 3, GFR 30-59 ml/min (HCC) 07/28/2018   Chronic diarrhea 05/10/2018   Chronic respiratory failure with hypoxia (HCC) 01/29/2018   Osteopenia 09/25/2017   Pulmonary nodule 02/02/2017   Neck pain 01/30/2017   Encounter for general adult medical examination with abnormal findings 06/26/2016   Advanced care planning/counseling discussion 06/26/2016   HLD (hyperlipidemia) 06/26/2016   Prediabetes 06/26/2016   Hair loss 01/07/2016   Subclinical hypothyroidism 01/07/2016   Aortic atherosclerosis (HCC) 01/07/2016   CAD (coronary artery disease) 01/07/2016   COVID-19 virus infection 11/26/2015   COPD exacerbation (HCC)  11/26/2015   Ex-smoker 09/25/2015   Chronic obstructive pulmonary disease (HCC) 08/16/2015    Social History   Tobacco Use   Smoking status: Former    Current packs/day: 0.00    Average packs/day: 1.3 packs/day for 40.0 years  (50.0 ttl pk-yrs)    Types: Cigarettes    Start date: 1964    Quit date: 2003    Years since quitting: 22.6   Smokeless tobacco: Never  Substance Use Topics   Alcohol use: No    Allergies  Allergen Reactions   Pollen Extract Cough   Hydrocodone -Acetaminophen  Other (See Comments)   Tramadol  Other (See Comments)    Lightheaded, imbalance    Current Meds  Medication Sig   albuterol  (VENTOLIN  HFA) 108 (90 Base) MCG/ACT inhaler Inhale 2 puffs into the lungs every 4 (four) hours as needed for wheezing or shortness of breath.   calcium -vitamin D  (OSCAL WITH D) 500-200 MG-UNIT tablet Take 1 tablet by mouth.   Dextromethorphan-Guaifenesin  (MUCINEX  DM PO) Take 1 tablet by mouth daily.   mometasone  (NASONEX ) 50 MCG/ACT nasal spray Place 2 sprays into the nose daily.   montelukast  (SINGULAIR ) 10 MG tablet Take 1 tablet (10 mg total) by mouth daily.   Multiple Vitamins-Minerals (MULTIVITAMIN ADULT PO) Take 1 tablet by mouth.   Probiotic Product (PROBIOTIC-10 PO) Take 1 tablet by mouth.   vitamin B-12 (CYANOCOBALAMIN ) 1000 MCG tablet Take 1,000 mcg by mouth daily.   [DISCONTINUED] albuterol  (PROVENTIL ) (2.5 MG/3ML) 0.083% nebulizer solution Take 3 mLs (2.5 mg total) by nebulization every 6 (six) hours as needed for wheezing or shortness of breath.   [DISCONTINUED] atorvastatin  (LIPITOR) 20 MG tablet Take 1 tablet (20 mg total) by mouth daily.   [DISCONTINUED] TRELEGY ELLIPTA  100-62.5-25 MCG/ACT AEPB INHALE 1 PUFF BY MOUTH EVERY DAY    Immunization History  Administered Date(s) Administered   Fluad Quad(high Dose 65+) 02/03/2019, 01/30/2022, 03/11/2023   INFLUENZA, HIGH DOSE SEASONAL PF 02/07/2018, 02/23/2020, 03/14/2021   Influenza Split 01/30/2015   Influenza,inj,Quad PF,6+ Mos 01/31/2016, 06/24/2017   Influenza-Unspecified 06/05/2015   PFIZER Comirnaty(Gray Top)Covid-19 Tri-Sucrose Vaccine 03/15/2023   PFIZER(Purple Top)SARS-COV-2 Vaccination 07/16/2019, 08/09/2019, 02/09/2020    Pneumococcal Conjugate-13 06/05/2015   Pneumococcal Polysaccharide-23 03/28/2012   Tdap 06/23/2012        Objective:     BP 136/86 (BP Location: Left Arm, Patient Position: Sitting, Cuff Size: Normal)   Pulse 79   Temp 98.2 F (36.8 C) (Oral)   Ht 5' 4 (1.626 m)   Wt 142 lb (64.4 kg)   SpO2 95%   BMI 24.37 kg/m   SpO2: 95 %  GENERAL: Well-developed, well-nourished woman, impeccably groomed, no acute distress.  Fully ambulatory, no conversational dyspnea. HEAD: Normocephalic, atraumatic.  EYES: Pupils equal, round, reactive to light.  No scleral icterus.  MOUTH: Dentures both.  Oral mucosa moist. NECK: Supple. No thyromegaly. Trachea midline. No JVD.  No adenopathy. PULMONARY: Good air entry bilaterally.  Coarse otherwise, no adventitious sounds. CARDIOVASCULAR: S1 and S2. Regular rate and rhythm.  No rubs, murmurs or gallops heard. ABDOMEN: Benign. MUSCULOSKELETAL: No joint deformity, no clubbing, no edema.  NEUROLOGIC: No overt focal deficit, no gait disturbance, speech is fluent. SKIN: Intact,warm,dry. PSYCH: Mood and behavior normal         Assessment & Plan:     ICD-10-CM   1. Stage 2 moderate COPD by GOLD classification (HCC)  J44.9     2. Nocturnal hypoxemia due to obstructive chronic bronchitis (HCC)  J44.89    G47.36  3. SOB (shortness of breath)  R06.02      Meds ordered this encounter  Medications   Fluticasone -Umeclidin-Vilant (TRELEGY ELLIPTA ) 100-62.5-25 MCG/ACT AEPB    Sig: Inhale 1 puff into the lungs daily.    Dispense:  60 each    Refill:  5    Lot Number?:   [id number]    Expiration Date?:   07/31/2022    Manufacturer?:   [employer] [[id number]]    Quantity:   1   albuterol  (PROVENTIL ) (2.5 MG/3ML) 0.083% nebulizer solution    Sig: Take 3 mLs (2.5 mg total) by nebulization every 6 (six) hours as needed for wheezing or shortness of breath.    Dispense:  75 mL    Refill:  3   Discussion:    Chronic obstructive pulmonary disease  (COPD) Moderate COPD with well-managed shortness of breath, especially with recent cooler weather. Lung function shows slight improvement. Smoking cessation since early 2000s reduces risk factors. - Continue current inhaler regimen with Trelegy. - Send nebulizer solution to preferred pharmacy, uses it as rescue. - Advise checking with insurance for mail order program to reduce medication costs. - Schedule follow-up appointment in six months.       Advised if symptoms do not improve or worsen, to please contact office for sooner follow up or seek emergency care.    I spent 32 minutes of dedicated to the care of this patient on the date of this encounter to include pre-visit review of records, face-to-face time with the patient discussing conditions above, post visit ordering of testing, clinical documentation with the electronic health record, making appropriate referrals as documented, and communicating necessary findings to members of the patients care team.     C. Leita Sanders, MD Advanced Bronchoscopy PCCM [facility]    *This note was generated using voice recognition software/Dragon and/or AI transcription program.  Despite best efforts to proofread, errors can occur which can change the meaning. Any transcriptional errors that result from this process are unintentional and may not be fully corrected at the time of dictation.

## 2024-01-18 NOTE — Patient Instructions (Signed)
 VISIT SUMMARY:  You had a follow-up appointment to discuss your moderate COPD. You reported that your shortness of breath is well-managed and you have no new concerns. We reviewed your current treatment plan and discussed the increased cost of your medication.  YOUR PLAN:  -CHRONIC OBSTRUCTIVE PULMONARY DISEASE (COPD): COPD is a chronic lung condition that makes it hard to breathe. Your shortness of breath is well-managed, and your lung function has shown slight improvement. Continue using your current inhaler, Trelegy, and use your nebulizer solution as needed. We will send the nebulizer solution to [facility]. Please check with your insurance about a mail order program to reduce the cost of your medication.  -NOCTURNAL HYPOXEMIA: Continue your oxygen  supplementation at nighttime.  INSTRUCTIONS:  Please schedule a follow-up appointment in six months.  Call sooner should any problems arise.

## 2024-01-20 ENCOUNTER — Encounter

## 2024-01-20 ENCOUNTER — Ambulatory Visit: Admitting: Pulmonary Disease

## 2024-01-26 ENCOUNTER — Ambulatory Visit: Payer: Self-pay | Admitting: Pulmonary Disease

## 2024-01-28 ENCOUNTER — Other Ambulatory Visit: Payer: Self-pay | Admitting: Family Medicine

## 2024-01-28 DIAGNOSIS — E782 Mixed hyperlipidemia: Secondary | ICD-10-CM

## 2024-02-05 ENCOUNTER — Encounter: Payer: Self-pay | Admitting: Pulmonary Disease

## 2024-03-01 ENCOUNTER — Other Ambulatory Visit: Payer: Self-pay | Admitting: Family Medicine

## 2024-03-01 DIAGNOSIS — J439 Emphysema, unspecified: Secondary | ICD-10-CM

## 2024-03-01 NOTE — Telephone Encounter (Signed)
 Patient is overdue for CPE with Dr. KANDICE. Please schedule. 90 day supply sent in.

## 2024-03-16 ENCOUNTER — Ambulatory Visit: Admitting: Pulmonary Disease

## 2024-03-16 ENCOUNTER — Encounter

## 2024-04-15 ENCOUNTER — Other Ambulatory Visit: Payer: Self-pay | Admitting: Family Medicine

## 2024-04-15 DIAGNOSIS — E782 Mixed hyperlipidemia: Secondary | ICD-10-CM

## 2024-04-15 DIAGNOSIS — E038 Other specified hypothyroidism: Secondary | ICD-10-CM

## 2024-04-15 DIAGNOSIS — N1831 Chronic kidney disease, stage 3a: Secondary | ICD-10-CM

## 2024-04-15 DIAGNOSIS — R7303 Prediabetes: Secondary | ICD-10-CM

## 2024-04-19 ENCOUNTER — Other Ambulatory Visit (INDEPENDENT_AMBULATORY_CARE_PROVIDER_SITE_OTHER)

## 2024-04-19 DIAGNOSIS — E038 Other specified hypothyroidism: Secondary | ICD-10-CM | POA: Diagnosis not present

## 2024-04-19 DIAGNOSIS — E782 Mixed hyperlipidemia: Secondary | ICD-10-CM | POA: Diagnosis not present

## 2024-04-19 DIAGNOSIS — N1831 Chronic kidney disease, stage 3a: Secondary | ICD-10-CM | POA: Diagnosis not present

## 2024-04-19 DIAGNOSIS — R7303 Prediabetes: Secondary | ICD-10-CM

## 2024-04-19 LAB — CBC WITH DIFFERENTIAL/PLATELET
Basophils Absolute: 0 K/uL (ref 0.0–0.1)
Basophils Relative: 0.8 % (ref 0.0–3.0)
Eosinophils Absolute: 0.2 K/uL (ref 0.0–0.7)
Eosinophils Relative: 3.5 % (ref 0.0–5.0)
HCT: 41.4 % (ref 36.0–46.0)
Hemoglobin: 14.3 g/dL (ref 12.0–15.0)
Lymphocytes Relative: 23.9 % (ref 12.0–46.0)
Lymphs Abs: 1.3 K/uL (ref 0.7–4.0)
MCHC: 34.4 g/dL (ref 30.0–36.0)
MCV: 89.9 fl (ref 78.0–100.0)
Monocytes Absolute: 0.7 K/uL (ref 0.1–1.0)
Monocytes Relative: 11.8 % (ref 3.0–12.0)
Neutro Abs: 3.4 K/uL (ref 1.4–7.7)
Neutrophils Relative %: 60 % (ref 43.0–77.0)
Platelets: 220 K/uL (ref 150.0–400.0)
RBC: 4.61 Mil/uL (ref 3.87–5.11)
RDW: 12.9 % (ref 11.5–15.5)
WBC: 5.6 K/uL (ref 4.0–10.5)

## 2024-04-19 LAB — COMPREHENSIVE METABOLIC PANEL WITH GFR
ALT: 27 U/L (ref 0–35)
AST: 28 U/L (ref 0–37)
Albumin: 4.1 g/dL (ref 3.5–5.2)
Alkaline Phosphatase: 86 U/L (ref 39–117)
BUN: 17 mg/dL (ref 6–23)
CO2: 30 meq/L (ref 19–32)
Calcium: 9.1 mg/dL (ref 8.4–10.5)
Chloride: 104 meq/L (ref 96–112)
Creatinine, Ser: 0.97 mg/dL (ref 0.40–1.20)
GFR: 55.64 mL/min — ABNORMAL LOW (ref >60.00)
Glucose, Bld: 105 mg/dL — ABNORMAL HIGH (ref 70–99)
Potassium: 4.6 meq/L (ref 3.5–5.1)
Sodium: 140 meq/L (ref 135–145)
Total Bilirubin: 0.9 mg/dL (ref 0.2–1.2)
Total Protein: 6.8 g/dL (ref 6.0–8.3)

## 2024-04-19 LAB — PHOSPHORUS: Phosphorus: 3.3 mg/dL (ref 2.3–4.6)

## 2024-04-19 LAB — LIPID PANEL
Cholesterol: 156 mg/dL (ref 0–200)
HDL: 66.8 mg/dL (ref >39.00)
LDL Cholesterol: 66 mg/dL (ref 0–99)
NonHDL: 89.56
Total CHOL/HDL Ratio: 2
Triglycerides: 120 mg/dL (ref 0.0–149.0)
VLDL: 24 mg/dL (ref 0.0–40.0)

## 2024-04-19 LAB — TSH: TSH: 4.74 u[IU]/mL (ref 0.35–5.50)

## 2024-04-19 LAB — T4, FREE: Free T4: 0.73 ng/dL (ref 0.60–1.60)

## 2024-04-19 LAB — MICROALBUMIN / CREATININE URINE RATIO
Creatinine,U: 130.7 mg/dL
Microalb Creat Ratio: 10.7 mg/g (ref 0.0–30.0)
Microalb, Ur: 1.4 mg/dL (ref 0.0–1.9)

## 2024-04-19 LAB — VITAMIN D 25 HYDROXY (VIT D DEFICIENCY, FRACTURES): VITD: 48.85 ng/mL (ref 30.00–100.00)

## 2024-04-20 ENCOUNTER — Ambulatory Visit: Payer: Self-pay | Admitting: Family Medicine

## 2024-04-20 LAB — HEMOGLOBIN A1C: Hgb A1c MFr Bld: 6 % (ref 4.6–6.5)

## 2024-04-20 LAB — PARATHYROID HORMONE, INTACT (NO CA): PTH: 19 pg/mL (ref 16–77)

## 2024-04-26 ENCOUNTER — Encounter: Payer: Self-pay | Admitting: Family Medicine

## 2024-04-26 ENCOUNTER — Ambulatory Visit: Admitting: Family Medicine

## 2024-04-26 VITALS — BP 130/70 | HR 81 | Temp 98.3°F | Ht 64.5 in | Wt 144.1 lb

## 2024-04-26 DIAGNOSIS — M8588 Other specified disorders of bone density and structure, other site: Secondary | ICD-10-CM

## 2024-04-26 DIAGNOSIS — Z87891 Personal history of nicotine dependence: Secondary | ICD-10-CM

## 2024-04-26 DIAGNOSIS — J439 Emphysema, unspecified: Secondary | ICD-10-CM | POA: Diagnosis not present

## 2024-04-26 DIAGNOSIS — E782 Mixed hyperlipidemia: Secondary | ICD-10-CM

## 2024-04-26 DIAGNOSIS — Z7189 Other specified counseling: Secondary | ICD-10-CM | POA: Diagnosis not present

## 2024-04-26 DIAGNOSIS — J3089 Other allergic rhinitis: Secondary | ICD-10-CM

## 2024-04-26 DIAGNOSIS — N3941 Urge incontinence: Secondary | ICD-10-CM | POA: Diagnosis not present

## 2024-04-26 DIAGNOSIS — R7303 Prediabetes: Secondary | ICD-10-CM

## 2024-04-26 DIAGNOSIS — R8290 Unspecified abnormal findings in urine: Secondary | ICD-10-CM

## 2024-04-26 DIAGNOSIS — Z0000 Encounter for general adult medical examination without abnormal findings: Secondary | ICD-10-CM

## 2024-04-26 DIAGNOSIS — E038 Other specified hypothyroidism: Secondary | ICD-10-CM

## 2024-04-26 DIAGNOSIS — N1831 Chronic kidney disease, stage 3a: Secondary | ICD-10-CM

## 2024-04-26 DIAGNOSIS — K529 Noninfective gastroenteritis and colitis, unspecified: Secondary | ICD-10-CM

## 2024-04-26 LAB — POC URINALSYSI DIPSTICK (AUTOMATED)
Bilirubin, UA: NEGATIVE
Blood, UA: NEGATIVE
Glucose, UA: NEGATIVE
Ketones, UA: NEGATIVE
Nitrite, UA: POSITIVE
Protein, UA: NEGATIVE
Spec Grav, UA: >=1.03 — AB (ref 1.010–1.025)
Urobilinogen, UA: 0.2 U/dL
pH, UA: 6 (ref 5.0–8.0)

## 2024-04-26 MED ORDER — MONTELUKAST SODIUM 10 MG PO TABS: 10.0000 mg | ORAL_TABLET | Freq: Every day | ORAL | 3 refills | Status: AC

## 2024-04-26 MED ORDER — OXYBUTYNIN CHLORIDE 5 MG PO TABS: 5.0000 mg | ORAL_TABLET | Freq: Two times a day (BID) | ORAL | 3 refills | Status: AC | PRN

## 2024-04-26 MED ORDER — ATORVASTATIN CALCIUM 20 MG PO TABS: 20.0000 mg | ORAL_TABLET | Freq: Every day | ORAL | 3 refills | Status: AC

## 2024-04-26 NOTE — Assessment & Plan Note (Signed)
 Preventative protocols reviewed and updated unless pt declined. Discussed healthy diet and lifestyle.

## 2024-04-26 NOTE — Assessment & Plan Note (Signed)
 Previously discussed. Continues working on this.

## 2024-04-26 NOTE — Assessment & Plan Note (Signed)
 Chronic, stable. Continue atorvastatin . The 10-year ASCVD risk score (Arnett DK, et al., 2019) is: 25.9%   Values used to calculate the score:     Age: 78 years     Clincally relevant sex: Female     Is Non-Hispanic African American: No     Diabetic: No     Tobacco smoker: No     Systolic Blood Pressure: 130 mmHg     Is BP treated: No     HDL Cholesterol: 66.8 mg/dL     Total Cholesterol: 156 mg/dL

## 2024-04-26 NOTE — Assessment & Plan Note (Addendum)
 Ongoing, intermittent overall stable.  Nuts are triggers.  She notes Emma MVI (with b complex, resveratrol) has been helpful.  Latest colonoscopy 2023 reassuring but I don't see biopsies were taken.

## 2024-04-26 NOTE — Assessment & Plan Note (Addendum)
 Rpt DEXA with improvement noted in osteopenia.  Continue cal/vit D regular intake and weight bearing exercises.

## 2024-04-26 NOTE — Assessment & Plan Note (Addendum)
 Quit 2003.  Continue yearly lung cancer screens

## 2024-04-26 NOTE — Patient Instructions (Addendum)
 Continue working on living will - new packet provided.  Urinalysis today  May try oxybutynin  5mg  for bladder urge incontinence, may use twice daily as needed I have refilled singulair . May try off this to see if it is helping at all.  Good to see you today Return as needed or in 1 year for next physical.

## 2024-04-26 NOTE — Assessment & Plan Note (Addendum)
 Chronic, ongoing, worsening. She would be interested in medication.  Will try oxybutynin  5mg  BID PRN - reviewed cognitive, constipation, urinary retention, dry mouth precautions No h/o glaucoma.  Check UA - abnormal with tr LE and + nitrites so will send urine culture as well.

## 2024-04-26 NOTE — Assessment & Plan Note (Addendum)
 TFTs remain stable off replacement.

## 2024-04-26 NOTE — Progress Notes (Signed)
 Ph: [phone] Fax: [phone]   Patient ID: Stacey Durham, female    DOB: [birth year], 78 y.o.   MRN: [id number]  This visit was conducted in person.  BP 130/70   Pulse 81   Temp 98.3 F (36.8 C) (Oral)   Ht 5' 4.5 (1.638 m)   Wt 144 lb 2 oz (65.4 kg)   SpO2 92%   BMI 24.36 kg/m    CC: CPE Subjective:   HPI: Stacey Durham is a 78 y.o. female presenting on 04/26/2024 for Annual Exam   Saw health advisor 07/2023 for medicare wellness visit. Note reviewed.   No results found.  Flowsheet Row Office Visit from 04/26/2024 in [facility] [place]  PHQ-2 Total Score 0       04/26/2024    2:46 PM 08/23/2023   10:53 AM 06/01/2023    8:33 AM 08/18/2022    3:18 PM 08/07/2020   10:36 AM  Fall Risk   Falls in the past year? 1 0 1 0   Number falls in past yr: 1 0  0 1  Injury with Fall? 0 0 1 0 0  Risk for fall due to : No Fall Risks No Fall Risks  No Fall Risks   Follow up Falls evaluation completed Falls evaluation completed  Falls prevention discussed;Falls evaluation completed     Severe COPD with chronic dyspnea and wheezing, followed by pulm Herlene) on Trelegy, singulair , duonebs PRN. not regularly using nasonex .  She continues using O2 at night time.  Pulm nodules resolved on latest evaluation 05-22-2022.  Ex smoker - quit 05/22/02.   Chronic intermittent diarrhea for years. Can go up to 3 times a day. Nuts trigger loose stools. No abdominal pain or cramping, blood or mucous in stool. Normal colonoscopy recently. Infrequent bowel incontinence.  She has been taking Emma multivitamin with some improvement.   Chronic nocturia with daytime frequency, urgency, worsening this past year.  Has had bladder accidents. Wears pad all the time. No dysuria, abd pain, fever.   Preventative: COLONOSCOPY 12/2021 - TA x2, hemorrhoids, diverticulosis (Outlaw)  Well woman exam - s/p hysterectomy 05/22/1977, part of an ovary remains.  Mammo latest 11/2023 -  [facility]  DEXA T score -2.2 spine, -1.2 hip (08/2017).  DEXA 06/2023 - T -1.6 spine, not at increased fracture risk by FRAX score. Continues 1 cal/vit D pill daily and MVI. Encouraged weight bearing exercise. Lung cancer screening - undergoing screen - last chest CT May 22, 2022 Flu shot - yearly COVID vaccine Pfizer 07/2019, 07/2019, booster 01/2020  Tdap 22-May-2013, 08/2023 Pneumovax 03/2012, prevnar 06/2015, prevnar-20 01/2024 RSV - 01/2022 Shingrix - 12/2023, 08/2023 Advanced directive discussion - does not have, but working on this. Has signed advanced directive packet, needs to get notarized. HCPOA - partner India Arenas. Asked to bring us  a copy. Full code but wouldn't want prolonged life support.  Seat belt use discussed Sunscreen use discussed. No changing moles on skin. Ex smoker quit 2002-05-22 Alcohol - none  Dentist - doesn't see, has full dentures Eye exam yearly  Bowel - frequent loose stools  Bladder - nocturia x2-6, urinary urge incontinence, wears pad regularly. This is becoming bothersome - interested in medication for this. No stress incontinence symptoms.   Originally from [place]  Widower - husband passed away 05/23/11 Lives with partner India Sprinkle: retired, worked at [employer]: enjoys going to [employer], enjoys mining engineer bike  Diet: good water, fruits/vegetables daily  Relevant past medical, surgical, family and social history reviewed and updated as indicated. Interim medical history since our last visit reviewed. Allergies and medications reviewed and updated. Outpatient Medications Prior to Visit  Medication Sig Dispense Refill   albuterol  (PROVENTIL ) (2.5 MG/3ML) 0.083% nebulizer solution Take 3 mLs (2.5 mg total) by nebulization every 6 (six) hours as needed for wheezing or shortness of breath. 75 mL 3   albuterol  (VENTOLIN  HFA) 108 (90 Base) MCG/ACT inhaler Inhale 2 puffs into the lungs every 4 (four) hours as needed for wheezing or shortness of breath. 18 each  11   calcium -vitamin D  (OSCAL WITH D) 500-200 MG-UNIT tablet Take 1 tablet by mouth.     Dextromethorphan-Guaifenesin  (MUCINEX  DM PO) Take 1 tablet by mouth daily.     Fluticasone -Umeclidin-Vilant (TRELEGY ELLIPTA ) 100-62.5-25 MCG/ACT AEPB Inhale 1 puff into the lungs daily. 60 each 5   mometasone  (NASONEX ) 50 MCG/ACT nasal spray Place 2 sprays into the nose daily. 17 g 12   Multiple Vitamins-Minerals (MULTIVITAMIN ADULT PO) Take 1 tablet by mouth.     OVER THE COUNTER MEDICATION Take 2 mLs by mouth daily.     OVER THE COUNTER MEDICATION Take 2 capsules by mouth daily.     Probiotic Product (PROBIOTIC-10 PO) Take 1 tablet by mouth.     vitamin B-12 (CYANOCOBALAMIN ) 1000 MCG tablet Take 1,000 mcg by mouth daily.     atorvastatin  (LIPITOR) 20 MG tablet TAKE 1 TABLET BY MOUTH EVERY DAY 90 tablet 0   montelukast  (SINGULAIR ) 10 MG tablet TAKE 1 TABLET BY MOUTH EVERY DAY 90 tablet 0   No facility-administered medications prior to visit.     Per HPI unless specifically indicated in ROS section below Review of Systems  Constitutional:  Negative for activity change, appetite change, chills, fatigue, fever and unexpected weight change.  HENT:  Negative for hearing loss.   Eyes:  Negative for visual disturbance.  Respiratory:  Positive for shortness of breath (occ, rare). Negative for cough, chest tightness and wheezing.   Cardiovascular:  Negative for chest pain, palpitations and leg swelling.  Gastrointestinal:  Negative for abdominal distention, abdominal pain, blood in stool, constipation, diarrhea, nausea and vomiting.  Endocrine: Positive for cold intolerance.  Genitourinary:  Negative for difficulty urinating and hematuria.  Musculoskeletal:  Negative for arthralgias, myalgias and neck pain.  Skin:  Negative for rash.  Neurological:  Negative for dizziness, seizures, syncope and headaches.  Hematological:  Negative for adenopathy. Does not bruise/bleed easily.  Psychiatric/Behavioral:   Negative for dysphoric mood. The patient is not nervous/anxious.     Objective:  BP 130/70   Pulse 81   Temp 98.3 F (36.8 C) (Oral)   Ht 5' 4.5 (1.638 m)   Wt 144 lb 2 oz (65.4 kg)   SpO2 92%   BMI 24.36 kg/m   Wt Readings from Last 3 Encounters:  04/26/24 144 lb 2 oz (65.4 kg)  01/18/24 142 lb (64.4 kg)  01/17/24 143 lb (64.9 kg)      Physical Exam Vitals and nursing note reviewed.  Constitutional:      Appearance: Normal appearance. She is not ill-appearing.  HENT:     Head: Normocephalic and atraumatic.     Right Ear: Tympanic membrane, ear canal and external ear normal. There is no impacted cerumen.     Left Ear: Tympanic membrane, ear canal and external ear normal. There is no impacted cerumen.     Mouth/Throat:     Mouth: Mucous membranes are moist.  Pharynx: Oropharynx is clear. No oropharyngeal exudate or posterior oropharyngeal erythema.  Eyes:     General:        Right eye: No discharge.        Left eye: No discharge.     Extraocular Movements: Extraocular movements intact.     Conjunctiva/sclera: Conjunctivae normal.     Pupils: Pupils are equal, round, and reactive to light.  Neck:     Thyroid : No thyroid  mass or thyromegaly.     Vascular: No carotid bruit.  Cardiovascular:     Rate and Rhythm: Normal rate and regular rhythm.     Pulses: Normal pulses.     Heart sounds: Normal heart sounds. No murmur heard. Pulmonary:     Effort: Pulmonary effort is normal. No respiratory distress.     Breath sounds: Normal breath sounds. No wheezing, rhonchi or rales.  Abdominal:     General: Bowel sounds are normal. There is no distension.     Palpations: Abdomen is soft. There is no mass.     Tenderness: There is no abdominal tenderness. There is no guarding or rebound.     Hernia: No hernia is present.  Musculoskeletal:     Cervical back: Normal range of motion and neck supple. No rigidity.     Right lower leg: No edema.     Left lower leg: No edema.   Lymphadenopathy:     Cervical: No cervical adenopathy.  Skin:    General: Skin is warm and dry.     Findings: No rash.  Neurological:     General: No focal deficit present.     Mental Status: She is alert. Mental status is at baseline.  Psychiatric:        Mood and Affect: Mood normal.        Behavior: Behavior normal.       Results for orders placed or performed in visit on 04/26/24  POCT Urinalysis Dipstick (Automated)   Collection Time: 04/26/24  3:28 PM  Result Value Ref Range   Color, UA yellow    Clarity, UA clear    Glucose, UA Negative Negative   Bilirubin, UA Negative    Ketones, UA Negative    Spec Grav, UA >=1.030 (A) 1.010 - 1.025   Blood, UA Negative    pH, UA 6.0 5.0 - 8.0   Protein, UA Negative Negative   Urobilinogen, UA 0.2 0.2 or 1.0 E.U./dL   Nitrite, UA Positive    Leukocytes, UA Trace (A) Negative    Assessment & Plan:   Problem List Items Addressed This Visit     Health maintenance examination - Primary (Chronic)   Preventative protocols reviewed and updated unless pt declined. Discussed healthy diet and lifestyle.       Advanced care planning/counseling discussion (Chronic)   Previously discussed. Continues working on this.       Ex-smoker   Quit 2003.  Continue yearly lung cancer screens      Chronic obstructive pulmonary disease (HCC)   Sees pulm on Trelegy with good effect - continue.       Relevant Medications   montelukast  (SINGULAIR ) 10 MG tablet   Subclinical hypothyroidism   TFTs remain stable off replacement.       HLD (hyperlipidemia)   Chronic, stable. Continue atorvastatin . The 10-year ASCVD risk score (Arnett DK, et al., 2019) is: 25.9%   Values used to calculate the score:     Age: 52 years     Clincally relevant sex:  Female     Is Non-Hispanic African American: No     Diabetic: No     Tobacco smoker: No     Systolic Blood Pressure: 130 mmHg     Is BP treated: No     HDL Cholesterol: 66.8 mg/dL     Total  Cholesterol: 156 mg/dL       Relevant Medications   atorvastatin  (LIPITOR) 20 MG tablet   Prediabetes   Continue to encourage limiting added sugar in diet.       Osteopenia   Rpt DEXA with improvement noted in osteopenia.  Continue cal/vit D regular intake and weight bearing exercises.       Chronic diarrhea   Ongoing, intermittent overall stable.  Nuts are triggers.  She notes Emma MVI (with b complex, resveratrol) has been helpful.  Latest colonoscopy 2023 reassuring but I don't see biopsies were taken.       CKD (chronic kidney disease) stage 3, GFR 30-59 ml/min (HCC)   Chronic, stable period with GFR 55. Continue to monitor.       Urge incontinence of urine   Chronic, ongoing, worsening. She would be interested in medication.  Will try oxybutynin  5mg  BID PRN - reviewed cognitive, constipation, urinary retention, dry mouth precautions No h/o glaucoma.  Check UA - abnormal with tr LE and + nitrites so will send urine culture as well.       Relevant Medications   oxybutynin  (DITROPAN ) 5 MG tablet   Other Relevant Orders   POCT Urinalysis Dipstick (Automated) (Completed)   Urine Culture   Perennial allergic rhinitis   Chronic issue managed with singulair  - she is unsure if this is helping. Refilled today, but discussed trial off to see if any benefit.       Other Visit Diagnoses       Abnormal urinalysis         Pulmonary emphysema (HCC)       Relevant Medications   montelukast  (SINGULAIR ) 10 MG tablet        Meds ordered this encounter  Medications   oxybutynin  (DITROPAN ) 5 MG tablet    Sig: Take 1 tablet (5 mg total) by mouth 2 (two) times daily as needed for bladder spasms.    Dispense:  60 tablet    Refill:  3   atorvastatin  (LIPITOR) 20 MG tablet    Sig: Take 1 tablet (20 mg total) by mouth daily.    Dispense:  90 tablet    Refill:  3   montelukast  (SINGULAIR ) 10 MG tablet    Sig: Take 1 tablet (10 mg total) by mouth daily.    Dispense:  90  tablet    Refill:  3    Orders Placed This Encounter  Procedures   Urine Culture   POCT Urinalysis Dipstick (Automated)    Patient Instructions  Continue working on living will - new packet provided.  Urinalysis today  May try oxybutynin  5mg  for bladder urge incontinence, may use twice daily as needed I have refilled singulair . May try off this to see if it is helping at all.  Good to see you today Return as needed or in 1 year for next physical.  Follow up plan: Return in about 1 year (around 04/26/2025) for annual exam, prior fasting for blood work, medicare wellness visit.  Anton Blas, MD

## 2024-04-26 NOTE — Assessment & Plan Note (Signed)
 Sees pulm on Trelegy with good effect - continue.

## 2024-04-26 NOTE — Assessment & Plan Note (Signed)
 Chronic, stable period with GFR 55. Continue to monitor.

## 2024-04-26 NOTE — Assessment & Plan Note (Addendum)
 Continue to encourage limiting added sugar in diet.

## 2024-04-26 NOTE — Assessment & Plan Note (Signed)
 Chronic issue managed with singulair  - she is unsure if this is helping. Refilled today, but discussed trial off to see if any benefit.

## 2024-04-28 LAB — URINE CULTURE
MICRO NUMBER:: 17287248
SPECIMEN QUALITY:: ADEQUATE

## 2024-04-29 ENCOUNTER — Ambulatory Visit: Payer: Self-pay | Admitting: Family Medicine

## 2024-04-29 MED ORDER — AMOXICILLIN 875 MG PO TABS: 875.0000 mg | ORAL_TABLET | Freq: Two times a day (BID) | ORAL | 0 refills | Status: DC

## 2024-04-29 MED ORDER — AMOXICILLIN 875 MG PO TABS: 875.0000 mg | ORAL_TABLET | Freq: Two times a day (BID) | ORAL | 0 refills | Status: AC

## 2024-06-27 ENCOUNTER — Telehealth: Payer: Self-pay

## 2024-06-27 NOTE — Telephone Encounter (Signed)
 I have sent an urgent message to [employer] asking what is needed. The 02 was just ordered on 09/03/23. Maybe they are trying to get started on recert for her 02 in April

## 2024-06-27 NOTE — Telephone Encounter (Signed)
 Copied from CRM #[id number]. Topic: Clinical - Order For Equipment >> Jun 27, 2024  1:14 PM Dedra B wrote: Reason for CRM: Patient said [employer] [facility] needs a new prescription for her oxygen . >> Jun 27, 2024  1:17 PM Dedra B wrote: Did not mean to send to [place].

## 2024-06-27 NOTE — Telephone Encounter (Signed)
 Patient has been on oxygen .   Stacey Durham can you see what [employer] is needing? Thank you!

## 2024-06-28 NOTE — Telephone Encounter (Signed)
 I spoke to the patient. She said [employer] is trying to qualify her for O2 for another year.   Donzell can you send her last office note to [employer] and they can fax over anything else that they need.  Thank you!

## 2024-06-28 NOTE — Telephone Encounter (Signed)
 I received a response from Dolanda with [employer]  Viktoria Ephraim [place] so I just heard back from [place] and to their understanding talking to patient she has nocturnal o2 but she is saying that md is wanting to have an ONO on room air preformed on her to see if she still needs O2 so ibkt thing needed is an order for an ono on ra signed off by md and we can go from there   Dr. Evalyn note from 01/18/24 doesn't mention ONO to be done just ROV in 6 months

## 2024-07-21 ENCOUNTER — Ambulatory Visit: Admitting: Pulmonary Disease

## 2024-08-28 ENCOUNTER — Ambulatory Visit

## 2025-04-20 ENCOUNTER — Other Ambulatory Visit

## 2025-05-01 ENCOUNTER — Encounter: Admitting: Family Medicine
